# Patient Record
Sex: Male | Born: 1955
Health system: Southern US, Community
[De-identification: ages and names within clinical notes are randomized; demographics above are authoritative.]

## PROBLEM LIST (undated history)

## (undated) DIAGNOSIS — E785 Hyperlipidemia, unspecified: Secondary | ICD-10-CM

## (undated) DIAGNOSIS — I509 Heart failure, unspecified: Secondary | ICD-10-CM

## (undated) DIAGNOSIS — T7840XA Allergy, unspecified, initial encounter: Secondary | ICD-10-CM

## (undated) DIAGNOSIS — I10 Essential (primary) hypertension: Secondary | ICD-10-CM

## (undated) DIAGNOSIS — Z9889 Other specified postprocedural states: Secondary | ICD-10-CM

## (undated) DIAGNOSIS — R739 Hyperglycemia, unspecified: Secondary | ICD-10-CM

## (undated) DIAGNOSIS — K219 Gastro-esophageal reflux disease without esophagitis: Secondary | ICD-10-CM

## (undated) DIAGNOSIS — J309 Allergic rhinitis, unspecified: Secondary | ICD-10-CM

## (undated) DIAGNOSIS — E669 Obesity, unspecified: Secondary | ICD-10-CM

## (undated) DIAGNOSIS — I5022 Chronic systolic (congestive) heart failure: Secondary | ICD-10-CM

## (undated) DIAGNOSIS — I428 Other cardiomyopathies: Secondary | ICD-10-CM

## (undated) DIAGNOSIS — G4730 Sleep apnea, unspecified: Secondary | ICD-10-CM

## (undated) DIAGNOSIS — M109 Gout, unspecified: Secondary | ICD-10-CM

## (undated) DIAGNOSIS — M5412 Radiculopathy, cervical region: Secondary | ICD-10-CM

## (undated) DIAGNOSIS — J45909 Unspecified asthma, uncomplicated: Secondary | ICD-10-CM

## (undated) DIAGNOSIS — M7711 Lateral epicondylitis, right elbow: Secondary | ICD-10-CM

## (undated) HISTORY — DX: Obesity, unspecified: E66.9

## (undated) HISTORY — DX: Allergic rhinitis, unspecified: J30.9

## (undated) HISTORY — DX: Allergy, unspecified, initial encounter: T78.40XA

## (undated) HISTORY — DX: Gout, unspecified: M10.9

## (undated) HISTORY — DX: Essential (primary) hypertension: I10

## (undated) HISTORY — DX: Chronic systolic (congestive) heart failure: I50.22

## (undated) HISTORY — DX: Unspecified asthma, uncomplicated: J45.909

## (undated) HISTORY — DX: Other cardiomyopathies: I42.8

## (undated) HISTORY — DX: Radiculopathy, cervical region: M54.12

## (undated) HISTORY — DX: Hyperlipidemia, unspecified: E78.5

## (undated) HISTORY — DX: Lateral epicondylitis, right elbow: M77.11

## (undated) HISTORY — PX: COLONOSCOPY: SHX174

## (undated) HISTORY — DX: Hyperglycemia, unspecified: R73.9

## (undated) SURGERY — Surgical Case
Anesthesia: *Unknown

---

## 1999-11-19 ENCOUNTER — Encounter: Admission: RE | Admit: 1999-11-19 | Discharge: 1999-11-19 | Payer: Self-pay | Admitting: General Surgery

## 1999-11-19 ENCOUNTER — Encounter: Payer: Self-pay | Admitting: General Surgery

## 1999-11-26 ENCOUNTER — Other Ambulatory Visit: Admission: RE | Admit: 1999-11-26 | Discharge: 1999-11-26 | Payer: Self-pay | Admitting: General Surgery

## 2002-04-28 ENCOUNTER — Emergency Department (HOSPITAL_COMMUNITY): Admission: EM | Admit: 2002-04-28 | Discharge: 2002-04-28 | Payer: Self-pay | Admitting: Emergency Medicine

## 2004-08-21 ENCOUNTER — Emergency Department (HOSPITAL_COMMUNITY): Admission: EM | Admit: 2004-08-21 | Discharge: 2004-08-21 | Payer: Self-pay | Admitting: Emergency Medicine

## 2005-07-13 ENCOUNTER — Ambulatory Visit: Payer: Self-pay | Admitting: Family Medicine

## 2005-07-14 ENCOUNTER — Ambulatory Visit: Payer: Self-pay | Admitting: *Deleted

## 2005-09-04 ENCOUNTER — Ambulatory Visit: Payer: Self-pay | Admitting: Family Medicine

## 2006-01-21 ENCOUNTER — Ambulatory Visit: Payer: Self-pay | Admitting: Family Medicine

## 2009-04-17 ENCOUNTER — Ambulatory Visit: Payer: Self-pay | Admitting: Gastroenterology

## 2009-05-01 ENCOUNTER — Encounter: Payer: Self-pay | Admitting: Gastroenterology

## 2009-05-01 ENCOUNTER — Ambulatory Visit: Payer: Self-pay | Admitting: Gastroenterology

## 2009-05-03 ENCOUNTER — Encounter: Payer: Self-pay | Admitting: Gastroenterology

## 2009-07-01 ENCOUNTER — Telehealth (INDEPENDENT_AMBULATORY_CARE_PROVIDER_SITE_OTHER): Payer: Self-pay | Admitting: *Deleted

## 2009-07-01 ENCOUNTER — Ambulatory Visit: Payer: Self-pay | Admitting: Internal Medicine

## 2009-07-01 ENCOUNTER — Telehealth: Payer: Self-pay | Admitting: Internal Medicine

## 2009-07-01 DIAGNOSIS — Z86010 Personal history of colonic polyps: Secondary | ICD-10-CM | POA: Insufficient documentation

## 2009-07-01 DIAGNOSIS — M109 Gout, unspecified: Secondary | ICD-10-CM | POA: Insufficient documentation

## 2009-07-01 DIAGNOSIS — J309 Allergic rhinitis, unspecified: Secondary | ICD-10-CM | POA: Insufficient documentation

## 2009-07-01 DIAGNOSIS — I10 Essential (primary) hypertension: Secondary | ICD-10-CM | POA: Insufficient documentation

## 2009-07-01 LAB — CONVERTED CEMR LAB
ALT: 28 units/L (ref 0–53)
AST: 25 units/L (ref 0–37)
Albumin: 4.5 g/dL (ref 3.5–5.2)
Alkaline Phosphatase: 47 units/L (ref 39–117)
BUN: 10 mg/dL (ref 6–23)
Basophils Absolute: 0 10*3/uL (ref 0.0–0.1)
Basophils Relative: 0.7 % (ref 0.0–3.0)
Bilirubin Urine: NEGATIVE
Bilirubin, Direct: 0.1 mg/dL (ref 0.0–0.3)
CO2: 30 meq/L (ref 19–32)
Calcium: 9.5 mg/dL (ref 8.4–10.5)
Chloride: 104 meq/L (ref 96–112)
Cholesterol: 176 mg/dL (ref 0–200)
Creatinine, Ser: 1 mg/dL (ref 0.4–1.5)
Eosinophils Absolute: 0.1 10*3/uL (ref 0.0–0.7)
Eosinophils Relative: 1.3 % (ref 0.0–5.0)
GFR calc non Af Amer: 99.83 mL/min (ref >60)
Glucose, Bld: 110 mg/dL — ABNORMAL HIGH (ref 70–99)
HCT: 51 % (ref 39.0–52.0)
HDL: 34.9 mg/dL — ABNORMAL LOW (ref >39.00)
Hemoglobin, Urine: NEGATIVE
Hemoglobin: 16.5 g/dL (ref 13.0–17.0)
Ketones, ur: NEGATIVE mg/dL
LDL Cholesterol: 123 mg/dL — ABNORMAL HIGH (ref 0–99)
Leukocytes, UA: NEGATIVE
Lymphocytes Relative: 44.1 % (ref 12.0–46.0)
Lymphs Abs: 2.6 10*3/uL (ref 0.7–4.0)
MCHC: 32.3 g/dL (ref 30.0–36.0)
MCV: 88.1 fL (ref 78.0–100.0)
Monocytes Absolute: 0.3 10*3/uL (ref 0.1–1.0)
Monocytes Relative: 4.3 % (ref 3.0–12.0)
Neutro Abs: 2.9 10*3/uL (ref 1.4–7.7)
Neutrophils Relative %: 49.6 % (ref 43.0–77.0)
Nitrite: NEGATIVE
PSA: 0.52 ng/mL (ref 0.10–4.00)
Platelets: 132 10*3/uL — ABNORMAL LOW (ref 150.0–400.0)
Potassium: 4.3 meq/L (ref 3.5–5.1)
RBC: 5.8 M/uL (ref 4.22–5.81)
RDW: 13.5 % (ref 11.5–14.6)
Sodium: 140 meq/L (ref 135–145)
Specific Gravity, Urine: 1.01 (ref 1.000–1.030)
TSH: 1.96 microintl units/mL (ref 0.35–5.50)
Total Bilirubin: 0.9 mg/dL (ref 0.3–1.2)
Total CHOL/HDL Ratio: 5
Total Protein, Urine: NEGATIVE mg/dL
Total Protein: 7.7 g/dL (ref 6.0–8.3)
Triglycerides: 90 mg/dL (ref 0.0–149.0)
Urine Glucose: NEGATIVE mg/dL
Urobilinogen, UA: 0.2 (ref 0.0–1.0)
VLDL: 18 mg/dL (ref 0.0–40.0)
WBC: 5.9 10*3/uL (ref 4.5–10.5)
pH: 6 (ref 5.0–8.0)

## 2009-07-02 LAB — CONVERTED CEMR LAB: Hgb A1c MFr Bld: 6.1 % (ref 4.6–6.5)

## 2009-07-12 ENCOUNTER — Encounter: Payer: Self-pay | Admitting: Internal Medicine

## 2010-01-02 ENCOUNTER — Encounter (INDEPENDENT_AMBULATORY_CARE_PROVIDER_SITE_OTHER): Payer: Self-pay | Admitting: *Deleted

## 2010-01-02 ENCOUNTER — Ambulatory Visit: Payer: Self-pay | Admitting: Internal Medicine

## 2010-04-18 ENCOUNTER — Ambulatory Visit: Payer: Self-pay | Admitting: Internal Medicine

## 2010-06-18 ENCOUNTER — Ambulatory Visit
Admission: RE | Admit: 2010-06-18 | Discharge: 2010-06-18 | Payer: Self-pay | Source: Home / Self Care | Attending: Internal Medicine | Admitting: Internal Medicine

## 2010-06-18 LAB — CONVERTED CEMR LAB
ALT: 24 units/L (ref 0–53)
AST: 24 units/L (ref 0–37)
Albumin: 4.2 g/dL (ref 3.5–5.2)
Alkaline Phosphatase: 50 units/L (ref 39–117)
BUN: 14 mg/dL (ref 6–23)
Basophils Absolute: 0 10*3/uL (ref 0.0–0.1)
Basophils Relative: 0.4 % (ref 0.0–3.0)
Bilirubin Urine: NEGATIVE
Bilirubin, Direct: 0 mg/dL (ref 0.0–0.3)
Blood, UA: NEGATIVE
CO2: 26 meq/L (ref 19–32)
Calcium: 9.3 mg/dL (ref 8.4–10.5)
Chloride: 106 meq/L (ref 96–112)
Cholesterol: 165 mg/dL (ref 0–200)
Creatinine, Ser: 1 mg/dL (ref 0.4–1.5)
Direct LDL: 105.8 mg/dL
Eosinophils Absolute: 0.1 10*3/uL (ref 0.0–0.7)
Eosinophils Relative: 2 % (ref 0.0–5.0)
GFR calc non Af Amer: 104.28 mL/min (ref >60.00)
Glucose, Bld: 99 mg/dL (ref 70–99)
HCT: 47.7 % (ref 39.0–52.0)
HDL: 28.7 mg/dL — ABNORMAL LOW (ref >39.00)
Hemoglobin: 15.8 g/dL (ref 13.0–17.0)
Hgb A1c MFr Bld: 6.3 % (ref 4.6–6.5)
Ketones, ur: NEGATIVE mg/dL
Leukocytes, UA: NEGATIVE
Lymphocytes Relative: 44.5 % (ref 12.0–46.0)
Lymphs Abs: 3.2 10*3/uL (ref 0.7–4.0)
MCHC: 33.2 g/dL (ref 30.0–36.0)
MCV: 86.6 fL (ref 78.0–100.0)
Monocytes Absolute: 0.5 10*3/uL (ref 0.1–1.0)
Monocytes Relative: 6.5 % (ref 3.0–12.0)
Neutro Abs: 3.4 10*3/uL (ref 1.4–7.7)
Neutrophils Relative %: 46.6 % (ref 43.0–77.0)
Nitrite: NEGATIVE
PSA: 0.4 ng/mL (ref 0.10–4.00)
Platelets: 143 10*3/uL — ABNORMAL LOW (ref 150.0–400.0)
Potassium: 4.3 meq/L (ref 3.5–5.1)
RBC: 5.51 M/uL (ref 4.22–5.81)
RDW: 14.5 % (ref 11.5–14.6)
Sodium: 138 meq/L (ref 135–145)
Specific Gravity, Urine: 1.02 (ref 1.000–1.030)
TSH: 3.47 microintl units/mL (ref 0.35–5.50)
Total Bilirubin: 0.3 mg/dL (ref 0.3–1.2)
Total CHOL/HDL Ratio: 6
Total Protein, Urine: NEGATIVE mg/dL
Total Protein: 6.9 g/dL (ref 6.0–8.3)
Triglycerides: 252 mg/dL — ABNORMAL HIGH (ref 0.0–149.0)
Uric Acid, Serum: 4.8 mg/dL (ref 4.0–7.8)
Urine Glucose: NEGATIVE mg/dL
Urobilinogen, UA: 0.2 (ref 0.0–1.0)
VLDL: 50.4 mg/dL — ABNORMAL HIGH (ref 0.0–40.0)
WBC: 7.3 10*3/uL (ref 4.5–10.5)
pH: 6 (ref 5.0–8.0)

## 2010-07-03 ENCOUNTER — Encounter: Payer: Self-pay | Admitting: Internal Medicine

## 2010-07-03 ENCOUNTER — Ambulatory Visit
Admission: RE | Admit: 2010-07-03 | Discharge: 2010-07-03 | Payer: Self-pay | Source: Home / Self Care | Attending: Internal Medicine | Admitting: Internal Medicine

## 2010-07-03 ENCOUNTER — Telehealth: Payer: Self-pay | Admitting: Internal Medicine

## 2010-07-03 DIAGNOSIS — E785 Hyperlipidemia, unspecified: Secondary | ICD-10-CM | POA: Insufficient documentation

## 2010-07-09 ENCOUNTER — Telehealth: Payer: Self-pay | Admitting: Endocrinology

## 2010-07-09 ENCOUNTER — Telehealth (INDEPENDENT_AMBULATORY_CARE_PROVIDER_SITE_OTHER): Payer: Self-pay | Admitting: *Deleted

## 2010-07-16 ENCOUNTER — Telehealth: Payer: Self-pay | Admitting: Internal Medicine

## 2010-07-17 ENCOUNTER — Ambulatory Visit
Admission: RE | Admit: 2010-07-17 | Discharge: 2010-07-17 | Payer: Self-pay | Source: Home / Self Care | Attending: Internal Medicine | Admitting: Internal Medicine

## 2010-07-17 ENCOUNTER — Encounter: Payer: Self-pay | Admitting: Internal Medicine

## 2010-07-17 DIAGNOSIS — K5289 Other specified noninfective gastroenteritis and colitis: Secondary | ICD-10-CM | POA: Insufficient documentation

## 2010-07-22 NOTE — Assessment & Plan Note (Signed)
Summary: FLU SHOT/JWJ /NWS  Nurse Visit   Allergies: No Known Drug Allergies  Orders Added: 1)  Admin 1st Vaccine [90471] 2)  Flu Vaccine 79yrs + [16109]       Flu Vaccine Consent Questions     Do you have a history of severe allergic reactions to this vaccine? no    Any prior history of allergic reactions to egg and/or gelatin? no    Do you have a sensitivity to the preservative Thimersol? no    Do you have a past history of Guillan-Barre Syndrome? no    Do you currently have an acute febrile illness? no    Have you ever had a severe reaction to latex? no    Vaccine information given and explained to patient? yes    Are you currently pregnant? no    Lot Number:[id number]   Exp Date:12/20/2010   Site Given  Left Deltoid IM1

## 2010-07-22 NOTE — Assessment & Plan Note (Signed)
Summary: NEW [employer] PT--PKG/GIVEN--STC   Vital Signs:  Patient profile:   55 year old male Height:      70 inches Weight:      210 pounds BMI:     30.24 O2 Sat:      98 % on Room air Temp:     98.4 degrees F oral Pulse rate:   61 / minute BP sitting:   138 / 96  (left arm) Cuff size:   regular  Vitals Entered ByMarland Kitchen Zella Ball Ewing (July 01, 2009 9:21 AM)  O2 Flow:  Room air  Preventive Care Screening     had flu shot in oct 2010  CC: new patient/Re   CC:  new patient/Re.  History of Present Illness: here with bilat knee pain to both soides only wth squatting , hard to get up from that possition, no swelling; no trouble with walkign or standing;  no hip or back problems;  Pt denies CP, sob, doe, wheezing, orthopnea, pnd, worsening LE edema, palps, dizziness or syncope  Pt denies new neuro symptoms such as headache, facial or extremity weakness Pt denies polydipsia, polyuria, or low sugar symptoms such as shakiness improved with eating.  Overall good compliance with meds, trying to follow low chol, DM diet, wt stable, little excercise however   Preventive Screening-Counseling & Management  Alcohol-Tobacco     Smoking Status: never  Problems Prior to Update: None  Medications Prior to Update: 1)  None  Current Medications (verified): 1)  Allopurinol 100 Mg Tabs (Allopurinol) .[name].. 1 By Mouth Once Daily 2)  Carvedilol 12.5 Mg Tabs (Carvedilol) .[name].. 1 By Mouth Two Times A Day 3)  Furosemide 20 Mg Tabs (Furosemide) .[name].. 1 By Mouth Once Daily 4)  Benazepril Hcl 10 Mg Tabs (Benazepril Hcl) .[name].. 1 By Mouth Once Daily 5)  Colchicine 0.6 Mg Tabs (Colchicine) .[name].. 1 By Mouth Once Daily  Allergies (verified): No Known Drug Allergies  Past History:  Family History: Last updated: 07/01/2009 mother withg lung cancer father with DM, heart disease with MI at 49yo  aunt with DM uncle with renal disease - dialysis sister with HTN  - died with brain aneurysm/stroke at 56 yo  Social  History: Last updated: 07/01/2009 Single no children work - [employer] Never Smoked Alcohol use-yes  Risk Factors: Smoking Status: never (07/01/2009)  Past Medical History: fatty liver Gout Hypertension Allergic rhinitis Colonic polyps, hx of  Past Surgical History: Denies surgical history  Family History: Reviewed history and no changes required. mother withg lung cancer father with DM, heart disease with MI at 26yo  aunt with DM uncle with renal disease - dialysis sister with HTN  - died with brain aneurysm/stroke at 56 yo  Social History: Reviewed history and no changes required. Single no children work - [employer] Never Smoked Alcohol use-yes Smoking Status:  never  Review of Systems       all otherwise negative per pt - 12 system review done   Physical Exam  General:  alert and overweight-appearing.   Head:  normocephalic and atraumatic.   Eyes:  vision grossly intact, pupils equal, and pupils round.   Ears:  R ear normal and L ear normal.   Nose:  no external deformity and no nasal discharge.   Mouth:  no gingival abnormalities and pharynx pink and moist.   Neck:  supple and no masses.   Lungs:  normal respiratory effort and normal breath sounds.   Heart:  normal rate and  regular rhythm.   Abdomen:  soft, non-tender, and normal bowel sounds.   Msk:  no joint tenderness and no joint swelling.   Extremities:  no edema, no erythema  Neurologic:  cranial nerves II-XII intact and strength normal in all extremities.     Impression & Recommendations:  Problem # 1:  Preventive Health Care (ICD-V70.0)  Overall doing well, up to date, counseled on routine health concerns for screening and prevention, immunizations up to date or declined, labs ordered  Orders: TLB-BMP (Basic Metabolic Panel-BMET) (80048-METABOL) TLB-CBC Platelet - w/Differential (85025-CBCD) TLB-Hepatic/Liver Function Pnl (80076-HEPATIC) TLB-Lipid Panel (80061-LIPID) TLB-TSH  (Thyroid Stimulating Hormone) (84443-TSH) TLB-PSA (Prostate Specific Antigen) (84153-PSA) TLB-Udip ONLY (81003-UDIP)  Complete Medication List: 1)  Allopurinol 100 Mg Tabs (Allopurinol) .[name].. 1 by mouth once daily 2)  Carvedilol 12.5 Mg Tabs (Carvedilol) .[name].. 1 by mouth two times a day 3)  Furosemide 20 Mg Tabs (Furosemide) .[name].. 1 by mouth once daily 4)  Benazepril Hcl 10 Mg Tabs (Benazepril hcl) .[name].. 1 by mouth once daily 5)  Colchicine 0.6 Mg Tabs (Colchicine) .[name].. 1 by mouth once daily  Other Orders: Tdap => 84yrs IM (42595) Admin 1st Vaccine (63875)  Patient Instructions: 1)  you had the tetanus shot today 2)  Please go to the Lab in the basement for your blood and/or urine tests today 3)  Please schedule a follow-up appointment in 6 months. Prescriptions: COLCHICINE 0.6 MG TABS (COLCHICINE) 1 by mouth once daily  #90 x 3   Entered and Authorized by:   Corwin Levins MD   Signed by:   Corwin Levins MD on 07/01/2009   Method used:   Print then Give to Patient   RxID:   [id number] BENAZEPRIL HCL 10 MG TABS (BENAZEPRIL HCL) 1 by mouth once daily  #90 x 3   Entered and Authorized by:   Corwin Levins MD   Signed by:   Corwin Levins MD on 07/01/2009   Method used:   Print then Give to Patient   RxID:   [id number] FUROSEMIDE 20 MG TABS (FUROSEMIDE) 1 by mouth once daily  #90 x 3   Entered and Authorized by:   Corwin Levins MD   Signed by:   Corwin Levins MD on 07/01/2009   Method used:   Print then Give to Patient   RxID:   [id number] CARVEDILOL 12.5 MG TABS (CARVEDILOL) 1 by mouth two times a day  #180 x 3   Entered and Authorized by:   Corwin Levins MD   Signed by:   Corwin Levins MD on 07/01/2009   Method used:   Print then Give to Patient   RxID:   [id number] ALLOPURINOL 100 MG TABS (ALLOPURINOL) 1 by mouth once daily  #90 x 3   Entered and Authorized by:   Corwin Levins MD   Signed by:   Corwin Levins MD on 07/01/2009   Method used:   Print then Give to  Patient   RxID:   [id number]    Immunization History:  Influenza Immunization History:    Influenza:  historical (03/22/2009)  Immunizations Administered:  Tetanus Vaccine:    Vaccine Type: Tdap    Site: right deltoid    Mfr: GlaxoSmithKline    Dose: 0.5 ml    Route: IM    Given by: Robin Ewing    Exp. Date: 08/17/2011    Lot #: [id number]    VIS given: 05/10/07 version given July 01, 2009.  

## 2010-07-22 NOTE — Progress Notes (Signed)
----   Converted from flag ---- ---- 07/01/2009 4:34 PM, Corwin Levins MD wrote: please send addon slip for hgba1c - 790.2 ------------------------------  sent addon to lab

## 2010-07-22 NOTE — Progress Notes (Signed)
----   Converted from flag ---- ---- 07/01/2009 4:34 PM, James W John MD wrote: please send addon slip for hgba1c - 790.2 ------------------------------  sent addon to lab 

## 2010-07-22 NOTE — Assessment & Plan Note (Signed)
Summary: 6 MO ROV PER PT/NWS  #   Vital Signs:  Patient profile:   55 year old male Height:      71 inches Weight:      211.50 pounds BMI:     29.60 O2 Sat:      96 % on Room air Temp:     98.4 degrees F oral Pulse rate:   72 / minute BP sitting:   134 / 92  (left arm) Cuff size:   large  Vitals Entered By: Zella Ball Ewing CMA Duncan Dull) (January 02, 2010 2:33 PM)  O2 Flow:  Room air CC: 6 month ROV/RE   CC:  6 month ROV/RE.  History of Present Illness: BP at home < 128/90;  overall doing very well, Pt denies CP, sob, doe, wheezing, orthopnea, pnd, worsening LE edema, palps, dizziness or syncope  Pt denies new neuro symptoms such as headache, facial or extremity weakness  Pt denies polydipsia, polyuria, or low sugar symptoms such as shakiness improved with eating.  Overall good compliance with meds, trying to follow low chol, DM diet, wt stable, little excercise however   No fever, wt loss, night sweats, or other constitutional symptoms  No recent joint swelling or pain. Sinus allergies have been mild in the past 2 wks with clear nasal d/c.  Problems Prior to Update: 1)  Preventive Health Care  (ICD-V70.0) 2)  Colonic Polyps, Hx of  (ICD-V12.72) 3)  Allergic Rhinitis  (ICD-477.9) 4)  Hypertension  (ICD-401.9) 5)  Gout  (ICD-274.9)  Medications Prior to Update: 1)  Allopurinol 300 Mg Tabs (Allopurinol) .[name].. 1 By Mouth Once Daily 2)  Carvedilol 12.5 Mg Tabs (Carvedilol) .[name].. 1 By Mouth Two Times A Day 3)  Furosemide 20 Mg Tabs (Furosemide) .[name].. 1 By Mouth Once Daily 4)  Benazepril Hcl 10 Mg Tabs (Benazepril Hcl) .[name].. 1 By Mouth Once Daily  Current Medications (verified): 1)  Allopurinol 300 Mg Tabs (Allopurinol) .[name].. 1 By Mouth Once Daily 2)  Carvedilol 12.5 Mg Tabs (Carvedilol) .[name].. 1 By Mouth Two Times A Day 3)  Furosemide 20 Mg Tabs (Furosemide) .[name].. 1 By Mouth Once Daily 4)  Benazepril Hcl 10 Mg Tabs (Benazepril Hcl) .[name].. 1 By Mouth Once Daily 5)  Colcrys 0.6 Mg Tabs (Colchicine)  .[name].. 1 By Mouth Once Daily  Allergies (verified): No Known Drug Allergies  Past History:  Past Medical History: Last updated: 07/01/2009 fatty liver Gout Hypertension Allergic rhinitis Colonic polyps, hx of  Past Surgical History: Last updated: 07/01/2009 Denies surgical history  Social History: Last updated: 07/01/2009 Single no children work - [employer] Never Smoked Alcohol use-yes  Risk Factors: Smoking Status: never (07/01/2009)  Review of Systems       all otherwise negative per pt -    Physical Exam  General:  alert and overweight-appearing.   Head:  normocephalic and atraumatic.   Eyes:  vision grossly intact, pupils equal, and pupils round.   Ears:  R ear normal and L ear normal.   Nose:  nasal dischargemucosal pallor and mucosal edema.   Mouth:  no gingival abnormalities and pharynx pink and moist.   Neck:  supple and no masses.   Lungs:  normal respiratory effort and normal breath sounds.   Heart:  normal rate and regular rhythm.   Msk:  no joint tenderness and no joint swelling.   Extremities:  no edema, no erythema    Impression & Recommendations:  Problem # 1:  GOUT (ICD-274.9)  His updated  medication list for this problem includes:    Allopurinol 300 Mg Tabs (Allopurinol) .[name]... 1 by mouth once daily    Colcrys 0.6 Mg Tabs (Colchicine) .[name]... 1 by mouth once daily stable overall by hx and exam, ok to continue meds/tx as is   Problem # 2:  HYPERTENSION (ICD-401.9)  His updated medication list for this problem includes:    Carvedilol 12.5 Mg Tabs (Carvedilol) .[name]... 1 by mouth two times a day    Furosemide 20 Mg Tabs (Furosemide) .[name]... 1 by mouth once daily    Benazepril Hcl 10 Mg Tabs (Benazepril hcl) .[name]... 1 by mouth once daily  BP today: 134/92 Prior BP: 138/96 (07/01/2009)  Labs Reviewed: K+: 4.3 (07/01/2009) Creat: : 1.0 (07/01/2009)   Chol: 176 (07/01/2009)   HDL: 34.90 (07/01/2009)   LDL: 123 (07/01/2009)   TG: 90.0  (07/01/2009) stable overall by hx and exam, ok to continue meds/tx as is   Problem # 3:  ALLERGIC RHINITIS (ICD-477.9) ok for OTC allegra as needed   Complete Medication List: 1)  Allopurinol 300 Mg Tabs (Allopurinol) .[name].. 1 by mouth once daily 2)  Carvedilol 12.5 Mg Tabs (Carvedilol) .[name].. 1 by mouth two times a day 3)  Furosemide 20 Mg Tabs (Furosemide) .[name].. 1 by mouth once daily 4)  Benazepril Hcl 10 Mg Tabs (Benazepril hcl) .[name].. 1 by mouth once daily 5)  Colcrys 0.6 Mg Tabs (Colchicine) .[name].. 1 by mouth once daily  Patient Instructions: 1)  Please get regular excercise, and follow lower cholesterol diet 2)  Continue all previous medications as before this visit  3)  Please schedule a follow-up appointment in 6 months with CPX labs and: 4)  uric acid 274.9 5)  HbgA1C prior to visit, ICD-9: 790.2 Prescriptions: COLCRYS 0.6 MG TABS (COLCHICINE) 1 by mouth once daily  #90 x 3   Entered and Authorized by:   Corwin Levins MD   Signed by:   Corwin Levins MD on 01/02/2010   Method used:   Print then Give to Patient   RxID:   [id number] BENAZEPRIL HCL 10 MG TABS (BENAZEPRIL HCL) 1 by mouth once daily  #90 x 3   Entered and Authorized by:   Corwin Levins MD   Signed by:   Corwin Levins MD on 01/02/2010   Method used:   Print then Give to Patient   RxID:   [id number] FUROSEMIDE 20 MG TABS (FUROSEMIDE) 1 by mouth once daily  #90 x 3   Entered and Authorized by:   Corwin Levins MD   Signed by:   Corwin Levins MD on 01/02/2010   Method used:   Print then Give to Patient   RxID:   [id number] CARVEDILOL 12.5 MG TABS (CARVEDILOL) 1 by mouth two times a day  #180 x 3   Entered and Authorized by:   Corwin Levins MD   Signed by:   Corwin Levins MD on 01/02/2010   Method used:   Print then Give to Patient   RxID:   [id number] ALLOPURINOL 300 MG TABS (ALLOPURINOL) 1 by mouth once daily  #90 x 3   Entered and Authorized by:   Corwin Levins MD   Signed by:   Corwin Levins MD on  01/02/2010   Method used:   Print then Give to Patient   RxID:   [id number]

## 2010-07-22 NOTE — Letter (Signed)
Summary: [facility]  [address] [place], [place] [address]   Phone: [phone]  Fax: [phone]    Today's Date: January 02, 2010  Name of Patient: Matthew Guerrero  The above named patient had a medical visit today at:  2:45 pm.  Please take this into consideration when reviewing the time away from work/school.    Special Instructions:  [ X ] None  [  ] To be off the remainder of today, returning to the normal work / school schedule tomorrow.  [  ] To be off until the next scheduled appointment on ______________________.  [  ] Other ________________________________________________________________ ________________________________________________________________________   Sincerely yours,   Margaret Pyle, CMA

## 2010-07-22 NOTE — Medication Information (Signed)
Summary: Request a Rx change/[facility]  [facility]   Imported By: Sherian Rein 07/16/2009 15:14:04  _____________________________________________________________________  External Attachment:    Type:   Image     Comment:   External Document

## 2010-07-24 NOTE — Letter (Signed)
Summary: Out of Work  [facility]  [address] [place], [place] [address]   Phone: [phone]  Fax: [phone]    July 17, 2010   Employee:  DAUNTE Sobotka    To Whom It May Concern:   For Medical reasons, please excuse the above named employee from work for the following dates:  Start:   07/17/10  End:   07/21/10  If you need additional information, please feel free to contact our office.         Sincerely,    Etta Grandchild MD

## 2010-07-24 NOTE — Progress Notes (Signed)
Summary: Alternative medication  Phone Note Call from Patient Call back at Home Phone [phone] Call back at [phone]   Caller: Patient Call For: Corwin Levins MD Summary of Call: Patient called requesting less expensive alternative to Colcrys. Patients pharmacy is [facility] [address] Initial call taken by: Robin Ewing CMA Duncan Dull),  July 09, 2010 11:30 AM  Follow-up for Phone Call        (duplicate message) Follow-up by: Minus Breeding MD,  July 09, 2010 12:19 PM

## 2010-07-24 NOTE — Assessment & Plan Note (Signed)
Summary: 6 MTH PHYSICAL  STC   Vital Signs:  Patient profile:   55 year old male Height:      70 inches Weight:      212.13 pounds BMI:     30.55 O2 Sat:      95 % on Room air Temp:     98 degrees F oral Pulse rate:   78 / minute BP sitting:   132 / 90  (left arm) Cuff size:   large  Vitals Entered By: Zella Ball Ewing CMA Duncan Dull) (July 03, 2010 8:35 AM)  O2 Flow:  Room air  Preventive Care Screening     had the flu shot  CC: Adult Physical/RE   CC:  Adult Physical/RE.  History of Present Illness: here for wellness, overall doing ok;  Pt denies CP, worsening sob, doe, wheezing, orthopnea, pnd, worsening LE edema, palps, dizziness or syncope  Pt denies new neuro symptoms such as headache, facial or extremity weakness  Pt denies polydipsia, polyuria,  Overall good compliance with meds, trying to follow low chol diet, wt stable, little excercise however  BP today higher than he always gets at home - usually < 130/90.  Denies worsening depressive symptoms, suicidal ideation, or panic.   Overall good compliance with meds, and good tolerability.  No fever, wt loss, night sweats, loss of appetite or other constitutional symptoms  Pt states good ability with ADL's, low fall risk, home safety reviewed and adequate, no significant change in hearing or vision, trying to follow lower chol diet, and occasionally active only with regular excercise.   Preventive Screening-Counseling & Management      Drug Use:  no.    Problems Prior to Update: 1)  Hyperlipidemia  (ICD-272.4) 2)  Preventive Health Care  (ICD-V70.0) 3)  Colonic Polyps, Hx of  (ICD-V12.72) 4)  Allergic Rhinitis  (ICD-477.9) 5)  Hypertension  (ICD-401.9) 6)  Gout  (ICD-274.9)  Medications Prior to Update: 1)  Allopurinol 300 Mg Tabs (Allopurinol) .[name].. 1 By Mouth Once Daily 2)  Carvedilol 12.5 Mg Tabs (Carvedilol) .[name].. 1 By Mouth Two Times A Day 3)  Furosemide 20 Mg Tabs (Furosemide) .[name].. 1 By Mouth Once Daily 4)  Benazepril  Hcl 10 Mg Tabs (Benazepril Hcl) .[name].. 1 By Mouth Once Daily 5)  Colcrys 0.6 Mg Tabs (Colchicine) .[name].. 1 By Mouth Once Daily  Current Medications (verified): 1)  Allopurinol 300 Mg Tabs (Allopurinol) .[name].. 1 By Mouth Once Daily 2)  Carvedilol 12.5 Mg Tabs (Carvedilol) .[name].. 1 By Mouth Two Times A Day 3)  Furosemide 20 Mg Tabs (Furosemide) .[name].. 1 By Mouth Once Daily 4)  Benazepril Hcl 10 Mg Tabs (Benazepril Hcl) .[name].. 1 By Mouth Once Daily 5)  Colcrys 0.6 Mg Tabs (Colchicine) .[name].. 1 By Mouth Once Daily 6)  Aspir-Low 81 Mg Tbec (Aspirin) .[name].. 1po Once Daily  Allergies (verified): No Known Drug Allergies  Past History:  Past Surgical History: Last updated: 07/01/2009 Denies surgical history  Family History: Last updated: 07/01/2009 mother withg lung cancer father with DM, heart disease with MI at 62yo  aunt with DM uncle with renal disease - dialysis sister with HTN  - died with brain aneurysm/stroke at 56 yo  Social History: Last updated: 07/03/2010 Single no children work - [employer] Never Smoked Alcohol use-yes Drug use-no  Risk Factors: Smoking Status: never (07/01/2009)  Past Medical History: fatty liver Gout Hypertension Allergic rhinitis Colonic polyps, hx of Hyperlipidemia  Social History: Single no children work - [employer] Never Smoked Alcohol use-yes Drug use-no Drug Use:  no  Review of Systems  The patient denies anorexia, fever, vision loss, decreased hearing, hoarseness, chest pain, syncope, dyspnea on exertion, peripheral edema, prolonged cough, headaches, hemoptysis, abdominal pain, melena, hematochezia, severe indigestion/heartburn, hematuria, muscle weakness, suspicious skin lesions, transient blindness, difficulty walking, depression, unusual weight change, abnormal bleeding, enlarged lymph nodes, and angioedema.         all otherwise negative per pt -    Physical Exam  General:  alert and overweight-appearing.   Head:   normocephalic and atraumatic.   Eyes:  vision grossly intact, pupils equal, and pupils round.   Ears:  R ear normal and L ear normal.   Nose:  nasal dischargemucosal pallor and mucosal edema.   Mouth:  no gingival abnormalities and pharynx pink and moist.   Neck:  supple and no masses.   Lungs:  normal respiratory effort and normal breath sounds.   Heart:  normal rate and regular rhythm.   Abdomen:  soft, non-tender, and normal bowel sounds.   Msk:  no joint tenderness and no joint swelling.   Extremities:  no edema, no erythema  Neurologic:  cranial nerves II-XII intact and strength normal in all extremities.   Skin:  color normal and no rashes.   Psych:  not anxious appearing and not depressed appearing.     Impression & Recommendations:  Problem # 1:  Preventive Health Care (ICD-V70.0) Overall doing well, age appropriate education and counseling updated, referral for preventive services and immunizations addressed, dietary counseling and smoking status adressed , most recent labs reviewed, ecg reviewed I have personally reviewed and have noted 1.The patient's medical and social history 2.Their use of alcohol, tobacco or illicit drugs 3.Their current medications and supplements 4. Functional ability including ADL's, fall risk, home safety risk, hearing & visual impairment  5.Diet and physical activities 6.Evidence for depression or mood disorders The patients weight, height, BMI  have been recorded in the chart I have made referrals, counseling and provided education to the patient based review of the above  Orders: EKG w/ Interpretation (93000)  Problem # 2:  HYPERTENSION (ICD-401.9)  His updated medication list for this problem includes:    Carvedilol 12.5 Mg Tabs (Carvedilol) .[name]... 1 by mouth two times a day    Furosemide 20 Mg Tabs (Furosemide) .[name]... 1 by mouth once daily    Benazepril Hcl 10 Mg Tabs (Benazepril hcl) .[name]... 1 by mouth once daily  BP today: 132/90 Prior  BP: 134/92 (01/02/2010)  Labs Reviewed: K+: 4.3 (06/18/2010) Creat: : 1.0 (06/18/2010)   Chol: 165 (06/18/2010)   HDL: 28.70 (06/18/2010)   LDL: 123 (07/01/2009)   TG: 252.0 (06/18/2010) stable overall by hx and exam, ok to continue meds/tx as is   Complete Medication List: 1)  Allopurinol 300 Mg Tabs (Allopurinol) .[name].. 1 by mouth once daily 2)  Carvedilol 12.5 Mg Tabs (Carvedilol) .[name].. 1 by mouth two times a day 3)  Furosemide 20 Mg Tabs (Furosemide) .[name].. 1 by mouth once daily 4)  Benazepril Hcl 10 Mg Tabs (Benazepril hcl) .[name].. 1 by mouth once daily 5)  Colcrys 0.6 Mg Tabs (Colchicine) .[name].. 1 by mouth once daily 6)  Aspir-low 81 Mg Tbec (Aspirin) .[name].. 1po once daily  Patient Instructions: 1)  Continue all previous medications as before this visit 2)  Please schedule a follow-up appointment in 1 year, or sooner if needed 3)  Take an Aspirin every day - 1 per day -  81 mg - COATED only Prescriptions: COLCRYS 0.6 MG TABS (COLCHICINE) 1 by mouth once daily  #90 x 3   Entered and Authorized by:   Corwin Levins MD   Signed by:   Corwin Levins MD on 07/03/2010   Method used:   Electronically to        [facility] [phone]* (retail)       [address]       [place], [place]  [address]       Ph: [phone]       Fax: [phone]   RxID:   [id number] BENAZEPRIL HCL 10 MG TABS (BENAZEPRIL HCL) 1 by mouth once daily  #90 x 3   Entered and Authorized by:   Corwin Levins MD   Signed by:   Corwin Levins MD on 07/03/2010   Method used:   Electronically to        [facility] [phone]* (retail)       [address]       [place], [place]  [address]       Ph: [phone]       Fax: [phone]   RxID:   [id number] FUROSEMIDE 20 MG TABS (FUROSEMIDE) 1 by mouth once daily  #90 x 3   Entered and Authorized by:   Corwin Levins MD   Signed by:   Corwin Levins MD on 07/03/2010   Method used:   Electronically to        [facility] [phone]* (retail)       [address]       [place], [place]  [address]       Ph: [phone]       Fax: [phone]   RxID:   [id number] CARVEDILOL 12.5 MG TABS (CARVEDILOL) 1 by mouth two times a day  #180 x 3   Entered and Authorized by:   Corwin Levins MD   Signed by:   Corwin Levins MD on 07/03/2010   Method used:   Electronically to        [facility] [phone]* (retail)       [address]       [place], [place]  [address]       Ph: [phone]       Fax: [phone]   RxID:   [id number] ALLOPURINOL 300 MG TABS (ALLOPURINOL) 1 by mouth once daily  #90 x 3   Entered and Authorized by:   Corwin Levins MD   Signed by:   Corwin Levins MD on 07/03/2010   Method used:   Electronically to        [facility] [phone]* (retail)       [address]       [place], [place]  [address]       Ph: [phone]       Fax: [phone]   RxID:   [id number] BENAZEPRIL HCL 10 MG TABS (BENAZEPRIL HCL) 1 by mouth once daily  #90 x 3   Entered and Authorized by:   Corwin Levins MD   Signed by:   Corwin Levins MD on 07/03/2010   Method used:   Electronically to        [facility] [phone]* (retail)       [address]       [place], [place]  [address]       Ph: [phone]  Fax: [phone]   RxID:   [id number] COLCRYS 0.6 MG TABS (COLCHICINE) 1 by mouth once daily  #90 x 3   Entered and Authorized by:   Corwin Levins MD   Signed by:   Corwin Levins MD on 07/03/2010   Method used:   Electronically to        [facility] [address]* (retail)       [address]       [place], [place]  [address]       Ph: [phone]       Fax: [phone]   RxID:   [id number] BENAZEPRIL HCL 10 MG TABS (BENAZEPRIL HCL) 1 by mouth once daily  #90 x 3   Entered and Authorized by:   Corwin Levins MD   Signed by:   Corwin Levins MD on 07/03/2010   Method used:   Electronically to        [facility] [address]* (retail)       [address]       [place], [place]  [address]       Ph: [phone]       Fax: [phone]   RxID:   [id number] FUROSEMIDE 20 MG TABS (FUROSEMIDE) 1 by mouth once daily  #90 x 3   Entered and Authorized by:   Corwin Levins MD   Signed by:   Corwin Levins MD on 07/03/2010   Method used:   Electronically to        [facility] [address]* (retail)       [address]       [place], [place]  [address]       Ph: [phone]       Fax: [phone]   RxID:   [id number] CARVEDILOL 12.5 MG TABS (CARVEDILOL) 1 by mouth two times a day  #180 x 3   Entered and Authorized by:   Corwin Levins MD   Signed by:   Corwin Levins MD on 07/03/2010   Method used:   Electronically to        [facility] [address]* (retail)       [address]       [place], [place]  [address]       Ph: [phone]       Fax: [phone]   RxID:   [id number] ALLOPURINOL 300 MG TABS (ALLOPURINOL) 1 by mouth once daily  #90 x 3   Entered and Authorized by:   Corwin Levins MD   Signed by:   Corwin Levins MD on 07/03/2010   Method used:   Electronically to        [facility] [address]* (retail)       [address]       [place], [place]  [address]       Ph: [phone]       Fax: [phone]   RxID:   [id number]    Orders Added: 1)  EKG w/ Interpretation [93000] 2)  Est. Patient 40-64 years [phone]

## 2010-07-24 NOTE — Progress Notes (Signed)
Summary: prescription for gout  Phone Note Call from Patient Call back at Home Phone [phone] Call back at [phone]   Caller: Patient Call For: Corwin Levins MD Summary of Call: patient called requesting prescription JWJ wrote for Gout is too expensive and needs cheaper alternative sent to [facility] [address] He also would like a 90 day supply. Initial call taken by: Robin Ewing CMA Duncan Dull),  July 09, 2010 12:01 PM  Follow-up for Phone Call        there is no alternative, but it would be cheaper to take just as needed. Follow-up by: Minus Breeding MD,  July 09, 2010 12:19 PM  Additional Follow-up for Phone Call Additional follow up Details #1::        called pt. left msg. to call back Additional Follow-up by: Robin Ewing CMA Duncan Dull),  July 09, 2010 4:27 PM    Additional Follow-up for Phone Call Additional follow up Details #2::    Called patient informed of Dr. George Hugh recommendation. The patient would like to have note passed onto JWJ to inform once back from vacation and get his recommendation,. Follow-up by: Zella Ball Ewing CMA Duncan Dull),  July 10, 2010 9:13 AM  Additional Follow-up for Phone Call Additional follow up Details #3:: Details for Additional Follow-up Action Taken: the only other alternative is to try uloric in place of the allopurinol/colcrys combo , as uroloric is supposed to be more effective than allopurinol and might get away without taking the combo  I think the best thing is to ask the patient if he wants to try this insted, but he should understand teh urolic is brand namea as well, and maybe even require a prior auth;  maybe he should ask at the pharmacy how much the 30 and 90 days of uloric might cost before a prescription is done Additional Follow-up by: Corwin Levins MD,  July 10, 2010 2:11 PM  New/Updated Medications: ULORIC 80 MG TABS (FEBUXOSTAT) 1po once daily Prescriptions: ULORIC 80 MG TABS (FEBUXOSTAT) 1po once daily  #30 x 11  Entered and Authorized by:   Corwin Levins MD   Signed by:   Corwin Levins MD on 07/13/2010   Method used:   Electronically to        [facility] [phone]* (retail)       [address]       [place], [place]  [address]       Ph: [phone]       Fax: [phone]   RxID:   [id number]    Called patient informed of information. He would like to try a 30 day supply of Uloric, pharmacy is [facility].  ok for uloric  - done per EMR  Please instruct pt to stop the allopurinol and colcrys  Corwin Levins MD  July 13, 2010 1:47 PM   Appended Document: prescription for gout called pt. informed of above information

## 2010-07-24 NOTE — Progress Notes (Signed)
----   Converted from flag ---- ---- 07/03/2010 9:20 AM, Corwin Levins MD wrote: please call pt - remind him he really should start taking COATED baby Aspirin 81 mg -1 per day - to avoid stroke and heart attacks ------------------------------ called pt. left msg. to call back  called pt on his cell number at [phone] and informed of above information.

## 2010-07-24 NOTE — Progress Notes (Signed)
Summary: Medication  Phone Note Call from Patient   Caller: Patient Call For: Corwin Levins MD Summary of Call: Patient called and he did pickup his 30 day supply of Ulroic. He said the cost was good and would like a 90 day supply of uloric sent to [facility]. Initial call taken by: Robin Ewing CMA Duncan Dull),  July 16, 2010 11:43 AM  Follow-up for Phone Call        ok - to robin to handle Follow-up by: Corwin Levins MD,  July 16, 2010 1:10 PM    Prescriptions: ULORIC 80 MG TABS (FEBUXOSTAT) 1po once daily  #90 x 3   Entered by:   Scharlene Gloss CMA (AAMA)   Authorized by:   Corwin Levins MD   Signed by:   Scharlene Gloss CMA (AAMA) on 07/16/2010   Method used:   Faxed to ...       [facility] (mail-order)             , [place]         Ph: [phone]       Fax: [phone]   RxID:   [id number]

## 2010-07-24 NOTE — Assessment & Plan Note (Signed)
Summary: VIRUS/NWS   Vital Signs:  Patient profile:   55 year old male Height:      70 inches Weight:      206 pounds BMI:     29.66 O2 Sat:      95 % on Room air Temp:     98.2 degrees F oral Pulse rate:   80 / minute Pulse rhythm:   regular Resp:     16 per minute BP sitting:   130 / 90  (left arm) Cuff size:   large  Vitals Entered By: Rock Nephew CMA (July 17, 2010 10:07 AM)  Nutrition Counseling: Patient's BMI is greater than 25 and therefore counseled on weight management options.  O2 Flow:  Room air CC: patient c/o nausea, vomiting, diarrhea x 1day, Diarrhea Is Patient Diabetic? No Pain Assessment Patient in pain? no       Does patient need assistance? Functional Status Self care Ambulation Normal   Primary Care Provider:  Corwin Levins MD  CC:  patient c/o nausea, vomiting, diarrhea x 1day, and Diarrhea.  History of Present Illness:  Diarrhea      This is a 55 year old man who presents with Diarrhea.  The symptoms began 2 days ago.  The severity is described as mild.  The patient reports 3 stools or less per day, watery/unformed stools, and abrupt onset of symptoms, but denies voluminous stools, blood in stool, mucus in stool, greasy stools, malodorous stools, fecal urgency, fecal soiling, alternating diarrhea/constipation, nocturnal diarrhea, fasting diarrhea, bloating, gassiness, and gradual onset of symptoms.  Associated symptoms include nausea and vomiting.  The patient denies fever, abdominal pain, abdominal cramps, lightheadedness, increased thirst, weight loss, joint pains, mouth ulcers, and eye redness.  The symptoms are worse with specific foods.  The symptoms are better with fasting.  Patient's risk factors for diarrhea include sick contact.    Preventive Screening-Counseling & Management  Alcohol-Tobacco     Alcohol drinks/day: 0     Alcohol Counseling: not indicated; patient does not drink     Smoking Status: never     Tobacco Counseling: not  indicated; no tobacco use  Hep-HIV-STD-Contraception     Hepatitis Risk: no risk noted     HIV Risk: no risk noted     STD Risk: no risk noted      Sexual History:  currently monogamous.        Drug Use:  never.        Blood Transfusions:  no.    Clinical Review Panels:  Prevention   Last Colonoscopy:  DONE (05/01/2009)   Last PSA:  0.40 (06/18/2010)  Immunizations   Last Tetanus Booster:  Tdap (07/01/2009)   Last Flu Vaccine:  Fluvax 3+ (04/18/2010)  Lipid Management   Cholesterol:  165 (06/18/2010)   LDL (bad choesterol):  123 (07/01/2009)   HDL (good cholesterol):  28.70 (06/18/2010)  Diabetes Management   HgBA1C:  6.3 (06/18/2010)   Creatinine:  1.0 (06/18/2010)   Last Flu Vaccine:  Fluvax 3+ (04/18/2010)  CBC   WBC:  7.3 (06/18/2010)   RBC:  5.51 (06/18/2010)   Hgb:  15.8 (06/18/2010)   Hct:  47.7 (06/18/2010)   Platelets:  143.0 (06/18/2010)   MCV  86.6 (06/18/2010)   MCHC  33.2 (06/18/2010)   RDW  14.5 (06/18/2010)   PMN:  46.6 (06/18/2010)   Lymphs:  44.5 (06/18/2010)   Monos:  6.5 (06/18/2010)   Eosinophils:  2.0 (06/18/2010)   Basophil:  0.4 (06/18/2010)  Complete Metabolic Panel   Glucose:  99 (06/18/2010)   Sodium:  138 (06/18/2010)   Potassium:  4.3 (06/18/2010)   Chloride:  106 (06/18/2010)   CO2:  26 (06/18/2010)   BUN:  14 (06/18/2010)   Creatinine:  1.0 (06/18/2010)   Albumin:  4.2 (06/18/2010)   Total Protein:  6.9 (06/18/2010)   Calcium:  9.3 (06/18/2010)   Total Bili:  0.3 (06/18/2010)   Alk Phos:  50 (06/18/2010)   SGPT (ALT):  24 (06/18/2010)   SGOT (AST):  24 (06/18/2010)   Medications Prior to Update: 1)  Carvedilol 12.5 Mg Tabs (Carvedilol) .[name].. 1 By Mouth Two Times A Day 2)  Furosemide 20 Mg Tabs (Furosemide) .[name].. 1 By Mouth Once Daily 3)  Benazepril Hcl 10 Mg Tabs (Benazepril Hcl) .[name].. 1 By Mouth Once Daily 4)  Aspir-Low 81 Mg Tbec (Aspirin) .[name].. 1po Once Daily 5)  Uloric 80 Mg Tabs (Febuxostat) .[name].. 1po Once  Daily  Current Medications (verified): 1)  Carvedilol 12.5 Mg Tabs (Carvedilol) .[name].. 1 By Mouth Two Times A Day 2)  Furosemide 20 Mg Tabs (Furosemide) .[name].. 1 By Mouth Once Daily 3)  Benazepril Hcl 10 Mg Tabs (Benazepril Hcl) .[name].. 1 By Mouth Once Daily 4)  Aspir-Low 81 Mg Tbec (Aspirin) .[name].. 1po Once Daily 5)  Uloric 80 Mg Tabs (Febuxostat) .[name].. 1po Once Daily 6)  Promethazine Hcl 25 Mg Tabs (Promethazine Hcl) .... 1/2 - 1 By Mouth Qid As Needed For Nausea/vomiting  Allergies (verified): No Known Drug Allergies  Past History:  Past Medical History: Last updated: 07/03/2010 fatty liver Gout Hypertension Allergic rhinitis Colonic polyps, hx of Hyperlipidemia  Past Surgical History: Last updated: 07/01/2009 Denies surgical history  Family History: Last updated: 07/01/2009 mother withg lung cancer father with DM, heart disease with MI at 44yo  aunt with DM uncle with renal disease - dialysis sister with HTN  - died with brain aneurysm/stroke at 56 yo  Social History: Last updated: 07/03/2010 Single no children work - [employer] Never Smoked Alcohol use-yes Drug use-no  Risk Factors: Smoking Status: never (07/17/2010)  Social History: Hepatitis Risk:  no risk noted HIV Risk:  no risk noted STD Risk:  no risk noted Sexual History:  currently monogamous Drug Use:  never Blood Transfusions:  no  Review of Systems  The patient denies anorexia, fever, weight loss, weight gain, chest pain, syncope, dyspnea on exertion, peripheral edema, prolonged cough, headaches, hemoptysis, abdominal pain, melena, hematochezia, severe indigestion/heartburn, hematuria, suspicious skin lesions, transient blindness, enlarged lymph nodes, and angioedema.    Physical Exam  General:  alert, well-developed, well-nourished, well-hydrated, appropriate dress, normal appearance, healthy-appearing, cooperative to examination, good hygiene, and overweight-appearing.   Head:  normocephalic  and atraumatic.   Eyes:  vision grossly intact, pupils equal, and no injection or icterus. Ears:  R ear normal and L ear normal.   Mouth:  Oral mucosa and oropharynx without lesions or exudates.  Teeth in good repair. Neck:  supple, full ROM, no masses, no thyromegaly, no thyroid nodules or tenderness, no JVD, normal carotid upstroke, no carotid bruits, no cervical lymphadenopathy, and no neck tenderness.   Lungs:  normal respiratory effort, no intercostal retractions, no accessory muscle use, normal breath sounds, no dullness, no fremitus, no crackles, and no wheezes.   Heart:  normal rate, regular rhythm, no murmur, no gallop, no rub, and no JVD.   Abdomen:  soft, non-tender, normal bowel sounds, no distention, no masses, no guarding, no rigidity, no rebound tenderness,  no abdominal hernia, no inguinal hernia, no hepatomegaly, and no splenomegaly.   Msk:  normal ROM, no joint tenderness, no joint swelling, no joint warmth, and no redness over joints.   Pulses:  R and L carotid,radial,femoral,dorsalis pedis and posterior tibial pulses are full and equal bilaterally Extremities:  No clubbing, cyanosis, edema, or deformity noted with normal full range of motion of all joints.   Neurologic:  No cranial nerve deficits noted. Station and gait are normal. Plantar reflexes are down-going bilaterally. DTRs are symmetrical throughout. Sensory, motor and coordinative functions appear intact. Skin:  turgor normal, color normal, no rashes, no suspicious lesions, no ecchymoses, no petechiae, no purpura, no ulcerations, no edema, and tattoo(s).   Cervical Nodes:  no anterior cervical adenopathy and no posterior cervical adenopathy.   Axillary Nodes:  no R axillary adenopathy and no L axillary adenopathy.   Inguinal Nodes:  no R inguinal adenopathy and no L inguinal adenopathy.   Psych:  Cognition and judgment appear intact. Alert and cooperative with normal attention span and concentration. No apparent delusions,  illusions, hallucinations   Impression & Recommendations:  Problem # 1:  GASTROENTERITIS (ICD-558.9) Assessment New  Discussed use of medication and role of diet. Encouraged clear liquids and electrolyte replacement fluids. Instructed to call if any signs of worsening dehydration.   Problem # 2:  HYPERTENSION (ICD-401.9) Assessment: Unchanged  His updated medication list for this problem includes:    Carvedilol 12.5 Mg Tabs (Carvedilol) .[name]... 1 by mouth two times a day    Furosemide 20 Mg Tabs (Furosemide) .[name]... 1 by mouth once daily    Benazepril Hcl 10 Mg Tabs (Benazepril hcl) .[name]... 1 by mouth once daily  BP today: 130/90 Prior BP: 132/90 (07/03/2010)  Labs Reviewed: K+: 4.3 (06/18/2010) Creat: : 1.0 (06/18/2010)   Chol: 165 (06/18/2010)   HDL: 28.70 (06/18/2010)   LDL: 123 (07/01/2009)   TG: 252.0 (06/18/2010)  Complete Medication List: 1)  Carvedilol 12.5 Mg Tabs (Carvedilol) .[name].. 1 by mouth two times a day 2)  Furosemide 20 Mg Tabs (Furosemide) .[name].. 1 by mouth once daily 3)  Benazepril Hcl 10 Mg Tabs (Benazepril hcl) .[name].. 1 by mouth once daily 4)  Aspir-low 81 Mg Tbec (Aspirin) .[name].. 1po once daily 5)  Uloric 80 Mg Tabs (Febuxostat) .[name].. 1po once daily 6)  Promethazine Hcl 25 Mg Tabs (Promethazine hcl) .... 1/2 - 1 by mouth qid as needed for nausea/vomiting  Patient Instructions: 1)  Please schedule a follow-up appointment in 2 weeks. 2)  Check your Blood Pressure regularly. If it is above 140/90: you should make an appointment. 3)  teh main problem with gastroenteritis is dehydration. Drink plenty of fluids and take solids as you feel better. If you are unable to keep anything down and/or you show signs of dehydration(dry/cracked lips, lack of tears, not urinating, very sleepy), call our office. Prescriptions: PROMETHAZINE HCL 25 MG TABS (PROMETHAZINE HCL) 1/2 - 1 by mouth QID as needed for nausea/vomiting  #25 x 0   Entered and Authorized by:   Etta Grandchild MD    Signed by:   Etta Grandchild MD on 07/17/2010   Method used:   Electronically to        [facility] [id number] (retail)       [address]       [place], [place]  [address]       Ph: [phone]  Fax: [phone]   RxID:   [id number]    Orders Added: 1)  Est. Patient Level IV [84696]

## 2010-10-15 ENCOUNTER — Other Ambulatory Visit: Payer: Self-pay

## 2010-10-15 MED ORDER — FUROSEMIDE 20 MG PO TABS: 20.0000 mg | ORAL_TABLET | Freq: Every day | ORAL | Status: DC

## 2010-10-15 MED ORDER — FEBUXOSTAT 80 MG PO TABS: 1.0000 | ORAL_TABLET | Freq: Every day | ORAL | Status: DC

## 2010-10-15 MED ORDER — CARVEDILOL 12.5 MG PO TABS: 12.5000 mg | ORAL_TABLET | Freq: Two times a day (BID) | ORAL | Status: DC

## 2010-10-15 MED ORDER — BENAZEPRIL HCL 10 MG PO TABS: 10.0000 mg | ORAL_TABLET | Freq: Every day | ORAL | Status: DC

## 2011-03-21 ENCOUNTER — Other Ambulatory Visit: Payer: Self-pay | Admitting: Internal Medicine

## 2011-09-05 ENCOUNTER — Encounter: Payer: Self-pay | Admitting: Internal Medicine

## 2011-09-05 DIAGNOSIS — Z0000 Encounter for general adult medical examination without abnormal findings: Secondary | ICD-10-CM | POA: Insufficient documentation

## 2011-09-08 ENCOUNTER — Ambulatory Visit (INDEPENDENT_AMBULATORY_CARE_PROVIDER_SITE_OTHER): Payer: BC Managed Care – PPO | Admitting: Internal Medicine

## 2011-09-08 ENCOUNTER — Other Ambulatory Visit (INDEPENDENT_AMBULATORY_CARE_PROVIDER_SITE_OTHER): Payer: BC Managed Care – PPO

## 2011-09-08 ENCOUNTER — Ambulatory Visit: Payer: Self-pay | Admitting: Internal Medicine

## 2011-09-08 ENCOUNTER — Encounter: Payer: Self-pay | Admitting: Internal Medicine

## 2011-09-08 VITALS — BP 110/70 | HR 89 | Temp 98.3°F | Ht 71.0 in | Wt 204.5 lb

## 2011-09-08 DIAGNOSIS — Z0000 Encounter for general adult medical examination without abnormal findings: Secondary | ICD-10-CM

## 2011-09-08 LAB — CBC WITH DIFFERENTIAL/PLATELET
Basophils Absolute: 0 10*3/uL (ref 0.0–0.1)
Eosinophils Absolute: 0.3 10*3/uL (ref 0.0–0.7)
HCT: 48.5 % (ref 39.0–52.0)
Hemoglobin: 15.6 g/dL (ref 13.0–17.0)
Lymphs Abs: 2.5 10*3/uL (ref 0.7–4.0)
MCHC: 32.1 g/dL (ref 30.0–36.0)
Monocytes Absolute: 0.8 10*3/uL (ref 0.1–1.0)
Neutro Abs: 7.1 10*3/uL (ref 1.4–7.7)
RDW: 14 % (ref 11.5–14.6)

## 2011-09-08 LAB — URINALYSIS, ROUTINE W REFLEX MICROSCOPIC
Hgb urine dipstick: NEGATIVE
Ketones, ur: NEGATIVE
Urine Glucose: NEGATIVE
Urobilinogen, UA: 0.2 (ref 0.0–1.0)

## 2011-09-08 LAB — HEPATIC FUNCTION PANEL
Alkaline Phosphatase: 41 U/L (ref 39–117)
Bilirubin, Direct: 0.1 mg/dL (ref 0.0–0.3)

## 2011-09-08 LAB — BASIC METABOLIC PANEL
CO2: 29 mEq/L (ref 19–32)
Calcium: 9.7 mg/dL (ref 8.4–10.5)
GFR: 78 mL/min (ref >60.00)
Potassium: 4.9 mEq/L (ref 3.5–5.1)
Sodium: 139 mEq/L (ref 135–145)

## 2011-09-08 LAB — LIPID PANEL
HDL: 34.6 mg/dL — ABNORMAL LOW (ref >39.00)
Total CHOL/HDL Ratio: 5
VLDL: 27.4 mg/dL (ref 0.0–40.0)

## 2011-09-08 MED ORDER — ASPIRIN 81 MG PO TBEC: 81.0000 mg | DELAYED_RELEASE_TABLET | Freq: Every day | ORAL | Status: AC

## 2011-09-08 MED ORDER — FUROSEMIDE 20 MG PO TABS: 20.0000 mg | ORAL_TABLET | Freq: Every day | ORAL | Status: DC

## 2011-09-08 MED ORDER — FEBUXOSTAT 80 MG PO TABS: 1.0000 | ORAL_TABLET | Freq: Every day | ORAL | Status: DC

## 2011-09-08 MED ORDER — CARVEDILOL 12.5 MG PO TABS: 12.5000 mg | ORAL_TABLET | Freq: Two times a day (BID) | ORAL | Status: DC

## 2011-09-08 MED ORDER — BENAZEPRIL HCL 10 MG PO TABS: 10.0000 mg | ORAL_TABLET | Freq: Every day | ORAL | Status: DC

## 2011-09-08 NOTE — Patient Instructions (Signed)
Please start the Aspirin 81 mg - 1 per day - COATED only, to reduce risk of stroke and heart attack You are given the medication refills today hardcopy Please go to LAB in the Basement for the blood and/or urine tests to be done today You will be contacted by phone if any changes need to be made immediately.  Otherwise, you will receive a letter about your results with an explanation. Please return in 1 year for your yearly visit, or sooner if needed, with Lab testing done 3-5 days before

## 2011-09-08 NOTE — Assessment & Plan Note (Signed)

## 2011-09-08 NOTE — Progress Notes (Signed)
Subjective:    Patient ID: Matthew Guerrero, male    DOB: 1955/11/11, 56 y.o.   MRN: [id number]  HPI  Here for wellness and f/u;  Overall doing ok;  Pt denies CP, worsening SOB, DOE, wheezing, orthopnea, PND, worsening LE edema, palpitations, dizziness or syncope.  Pt denies neurological change such as new Headache, facial or extremity weakness.  Pt denies polydipsia, polyuria, or low sugar symptoms. Pt states overall good compliance with treatment and medications, good tolerability, and trying to follow lower cholesterol diet.  Pt denies worsening depressive symptoms, suicidal ideation or panic. No fever, wt loss, night sweats, loss of appetite, or other constitutional symptoms.  Pt states good ability with ADL's, low fall risk, home safety reviewed and adequate, no significant changes in hearing or vision, and occasionally active with exercise.  Pk wt has been 330 in the past, was approx 220 2012, now 214 with more physical work.   Past Medical History  Diagnosis Date  . COLONIC POLYPS, HX OF 07/01/2009    Qualifier: Diagnosis of  By: Jonny Ruiz MD, Len Blalock   . ALLERGIC RHINITIS 07/01/2009    Qualifier: Diagnosis of  By: Jonny Ruiz MD, Len Blalock   . GOUT 07/01/2009    Qualifier: Diagnosis of  By: Jonny Ruiz MD, Len Blalock   . HYPERLIPIDEMIA 07/03/2010    Qualifier: Diagnosis of  By: Jonny Ruiz MD, Len Blalock   . HYPERTENSION 07/01/2009    Qualifier: Diagnosis of  By: Jonny Ruiz MD, Len Blalock    History reviewed. No pertinent past surgical history.  reports that he has never smoked. He does not have any smokeless tobacco history on file. He reports that he drinks alcohol. He reports that he does not use illicit drugs. family history includes Cancer in his mother; Diabetes in his father; and Heart disease in his father. No Known Allergies No current outpatient prescriptions on file prior to visit.    Review of Systems Review of Systems  Constitutional: Negative for diaphoresis, activity change, appetite change and unexpected weight  change.  HENT: Negative for hearing loss, ear pain, facial swelling, mouth sores and neck stiffness.   Eyes: Negative for pain, redness and visual disturbance.  Respiratory: Negative for shortness of breath and wheezing.   Cardiovascular: Negative for chest pain and palpitations.  Gastrointestinal: Negative for diarrhea, blood in stool, abdominal distention and rectal pain.  Genitourinary: Negative for hematuria, flank pain and decreased urine volume.  Musculoskeletal: Negative for myalgias and joint swelling.  Skin: Negative for color change and wound.  Neurological: Negative for syncope and numbness.  Hematological: Negative for adenopathy.  Psychiatric/Behavioral: Negative for hallucinations, self-injury, decreased concentration and agitation.      Objective:   Physical Exam BP 110/70  Pulse 89  Temp(Src) 98.3 F (36.8 C) (Oral)  Ht 5\' 11"  (1.803 m)  Wt 204 lb 8 oz (92.761 kg)  BMI 28.52 kg/m2  SpO2 94% Physical Exam  VS noted Constitutional: Pt is oriented to person, place, and time. Appears well-developed and well-nourished.  HENT:  Head: Normocephalic and atraumatic.  Right Ear: External ear normal.  Left Ear: External ear normal.  Nose: Nose normal.  Mouth/Throat: Oropharynx is clear and moist.  Eyes: Conjunctivae and EOM are normal. Pupils are equal, round, and reactive to light.  Neck: Normal range of motion. Neck supple. No JVD present. No tracheal deviation present.  Cardiovascular: Normal rate, regular rhythm, normal heart sounds and intact distal pulses.   Pulmonary/Chest: Effort normal and breath sounds normal.  Abdominal: Soft. Bowel  sounds are normal. There is no tenderness.  Musculoskeletal: Normal range of motion. Exhibits no edema.  Lymphadenopathy:  Has no cervical adenopathy.  Neurological: Pt is alert and oriented to person, place, and time. Pt has normal reflexes. No cranial nerve deficit.  Skin: Skin is warm and dry. No rash noted.  Psychiatric:  Has   normal mood and affect. Behavior is normal.     Assessment & Plan:

## 2011-12-02 ENCOUNTER — Emergency Department (HOSPITAL_COMMUNITY)
Admission: EM | Admit: 2011-12-02 | Discharge: 2011-12-02 | Disposition: A | Discharge status: Home / Self Care | Payer: BC Managed Care – PPO | Attending: Emergency Medicine | Admitting: Emergency Medicine

## 2011-12-02 ENCOUNTER — Encounter (HOSPITAL_COMMUNITY): Payer: Self-pay | Admitting: *Deleted

## 2011-12-02 ENCOUNTER — Telehealth: Payer: Self-pay | Admitting: Internal Medicine

## 2011-12-02 ENCOUNTER — Emergency Department (HOSPITAL_COMMUNITY): Payer: BC Managed Care – PPO

## 2011-12-02 DIAGNOSIS — Z79899 Other long term (current) drug therapy: Secondary | ICD-10-CM | POA: Insufficient documentation

## 2011-12-02 DIAGNOSIS — R209 Unspecified disturbances of skin sensation: Secondary | ICD-10-CM | POA: Insufficient documentation

## 2011-12-02 DIAGNOSIS — M542 Cervicalgia: Secondary | ICD-10-CM | POA: Insufficient documentation

## 2011-12-02 DIAGNOSIS — M79609 Pain in unspecified limb: Secondary | ICD-10-CM | POA: Insufficient documentation

## 2011-12-02 DIAGNOSIS — G629 Polyneuropathy, unspecified: Secondary | ICD-10-CM

## 2011-12-02 LAB — BASIC METABOLIC PANEL
BUN: 17 mg/dL (ref 6–23)
CO2: 27 mEq/L (ref 19–32)
Chloride: 102 mEq/L (ref 96–112)
Creatinine, Ser: 1.09 mg/dL (ref 0.50–1.35)
GFR calc Af Amer: 85 mL/min — ABNORMAL LOW (ref >90)

## 2011-12-02 LAB — CBC
HCT: 48.2 % (ref 39.0–52.0)
MCV: 85.3 fL (ref 78.0–100.0)
RDW: 13.7 % (ref 11.5–15.5)
WBC: 5.9 10*3/uL (ref 4.0–10.5)

## 2011-12-02 MED ORDER — IBUPROFEN 400 MG PO TABS: 400.0000 mg | ORAL_TABLET | Freq: Four times a day (QID) | ORAL | Status: AC | PRN

## 2011-12-02 MED ORDER — ASPIRIN 325 MG PO TABS: 325.0000 mg | ORAL_TABLET | ORAL | Status: DC

## 2011-12-02 MED ORDER — SODIUM CHLORIDE 0.9 % IJ SOLN: 3.0000 mL | INTRAMUSCULAR | Status: DC

## 2011-12-02 MED ORDER — ASPIRIN 81 MG PO CHEW
CHEWABLE_TABLET | ORAL | Status: AC
  Administered 2011-12-02: 243 mg
  Filled 2011-12-02: qty 3

## 2011-12-02 MED ORDER — NITROGLYCERIN 0.4 MG SL SUBL: 0.4000 mg | SUBLINGUAL_TABLET | SUBLINGUAL | Status: DC | PRN

## 2011-12-02 MED ORDER — ASPIRIN 81 MG PO CHEW: 81.0000 mg | CHEWABLE_TABLET | Freq: Once | ORAL | Status: DC

## 2011-12-02 NOTE — ED Provider Notes (Signed)
History     CSN: [id number] Arrival date & time 12/02/11  1303 First MD Initiated Contact with Patient 12/02/11 1346    Chief Complaint  Patient presents with  . Arm Pain   HPI Pt has been having intermittent pain in his left arm.  The discomfort is a tingling.  No chest pain.  No shortness of breath.  No increase with exertion.   The tingling is in the upper medial arm running towards the forearm.  Nl strength.  No neck pain although at times it is sore.    No other symptoms.   Past Medical History  Diagnosis Date  . COLONIC POLYPS, HX OF 07/01/2009    Qualifier: Diagnosis of  By: Jonny Ruiz MD, Len Blalock   . ALLERGIC RHINITIS 07/01/2009    Qualifier: Diagnosis of  By: Jonny Ruiz MD, Len Blalock   . GOUT 07/01/2009    Qualifier: Diagnosis of  By: Jonny Ruiz MD, Len Blalock   . HYPERLIPIDEMIA 07/03/2010    Qualifier: Diagnosis of  By: Jonny Ruiz MD, Len Blalock   . HYPERTENSION 07/01/2009    Qualifier: Diagnosis of  By: Jonny Ruiz MD, Len Blalock     History reviewed. No pertinent past surgical history.  Family History  Problem Relation Age of Onset  . Cancer Mother   . Diabetes Father   . Heart disease Father     History  Substance Use Topics  . Smoking status: Never Smoker   . Smokeless tobacco: Not on file  . Alcohol Use: Yes     social      Review of Systems  Respiratory: Negative for chest tightness.   Cardiovascular: Negative for chest pain.  Neurological: Negative for tremors, syncope, speech difficulty, weakness and numbness.  All other systems reviewed and are negative.    Allergies  Review of patient's allergies indicates no known allergies.  Home Medications   Current Outpatient Rx  Name Route Sig Dispense Refill  . ASPIRIN 81 MG PO TBEC Oral Take 1 tablet (81 mg total) by mouth daily. Swallow whole. 30 tablet 12  . BENAZEPRIL HCL 10 MG PO TABS Oral Take 1 tablet (10 mg total) by mouth daily. 90 tablet 3  . CARVEDILOL 12.5 MG PO TABS Oral Take 1 tablet (12.5 mg total) by mouth 2 (two) times daily.  180 tablet 3  . FEBUXOSTAT 80 MG PO TABS Oral Take 1 tablet (80 mg total) by mouth daily. 90 tablet 3  . FUROSEMIDE 20 MG PO TABS Oral Take 1 tablet (20 mg total) by mouth daily. 90 tablet 3    BP 144/93  Pulse 82  Temp 98.8 F (37.1 C) (Oral)  Resp 18  SpO2 100%  Physical Exam  Nursing note and vitals reviewed. Constitutional: He appears well-developed and well-nourished. No distress.  HENT:  Head: Normocephalic and atraumatic.  Right Ear: External ear normal.  Left Ear: External ear normal.  Eyes: Conjunctivae are normal. Right eye exhibits no discharge. Left eye exhibits no discharge. No scleral icterus.  Neck: Neck supple. No tracheal deviation present.  Cardiovascular: Normal rate, regular rhythm and intact distal pulses.   Pulmonary/Chest: Effort normal and breath sounds normal. No stridor. No respiratory distress. He has no wheezes. He has no rales.  Abdominal: Soft. Bowel sounds are normal. He exhibits no distension. There is no tenderness. There is no rebound and no guarding.  Musculoskeletal: He exhibits no edema and no tenderness.       Cervical back: He exhibits tenderness (parapsinal). He exhibits normal  range of motion, no swelling and no edema.       Strong radial pulse bilaterally, 5//5 strength; nl sensation;   Neurological: He is alert. He has normal strength. No sensory deficit. Cranial nerve deficit:  no gross defecits noted. He exhibits normal muscle tone. He displays no seizure activity. Coordination normal.  Skin: Skin is warm and dry. No rash noted.  Psychiatric: He has a normal mood and affect.    ED Course  Procedures (including critical care time)  Rate: 86  Rhythm: normal sinus rhythm  QRS Axis: normal  Intervals: normal  ST/T Wave abnormalities: Borderline T-wave changes  Conduction Disutrbances:none  Narrative Interpretation: Consider left ventricular hypertrophy, no signs of acute ischemia  Old EKG Reviewed: none available  Labs Reviewed  CBC  - Abnormal; Notable for the following:    Platelets 146 (*)     All other components within normal limits  BASIC METABOLIC PANEL - Abnormal; Notable for the following:    GFR calc non Af Amer 74 (*)     GFR calc Af Amer 85 (*)     All other components within normal limits   Dg Chest 2 View  12/02/2011  *RADIOLOGY REPORT*  Clinical Data: Right neck and arm pain for 2 days.  No known injury.  CHEST - 2 VIEW  Comparison: None.  Findings: The heart size and mediastinal contours are normal. The lungs are clear. There is no pleural effusion or pneumothorax. No acute osseous findings are identified.  Telemetry leads overlie the chest.  IMPRESSION: No active cardiopulmonary process.  Original Report Authenticated By: Gerrianne Scale, M.D.   Dg Cervical Spine Complete  12/02/2011  *RADIOLOGY REPORT*  Clinical Data: Right neck and arm pain for 2 days without injury.  CERVICAL SPINE - COMPLETE 4+ VIEW  Comparison: None.  Findings: Spurring anterior to the vertebral body column is present multiple levels, most prominently at C3-4.  No significant vertebral subluxation.  No prevertebral soft tissue swelling noted.  No significant loss of intervertebral disc space or significant osseous foraminal stenosis noted.  Facet arthropathy is present at the C6-7 level.  IMPRESSION:  1.  Mild cervical spondylosis.   Otherwise, no significant abnormality identified.  Original Report Authenticated By: Dellia Cloud, M.D.      MDM  The patient presents to the emergency room with complaints of a tingling sensation in his left upper extremity. He's not having any complaints of chest pain or shortness of breath. His symptoms are most likely related to a radiculopathy. His cervical spine films do show signs of mild cervical spondylosis but otherwise no significant abnormality. I have reassured the patient that at this time there does not appear to be evidence to suggest a stroke or heart attack. Irecommended he follow  up with his primary Dr. to consider further outpatient testing such as MRI.  At this time there does not appear to be any evidence of an acute emergency medical condition and the patient appears stable for discharge with appropriate outpatient follow up.         Celene Kras, MD 12/02/11 [phone]

## 2011-12-02 NOTE — Discharge Instructions (Signed)
Peripheral Nerve Problems Peripheral nerve disorders are problems with the nerves in your arms or legs. CAUSES  There are many different causes of these disorders. They include:  Injury.   Diabetes.   Chronic alcoholism.   Toxic chemicals and drugs.   Vitamin deficiencies.   Tumors.   Liver or kidney diseases.  SYMPTOMS  Some of the problems caused are:  Tingling, burning, pain, and numbness in the extremities and feet.   Weakness, loss of muscle tone, and size.  DIAGNOSIS  Sometimes blood tests and studies to examine nerve function are needed to make the diagnosis.  TREATMENT  Sometimes peripheral nerve problems can be treated with vitamins, medication, and avoiding known toxins such as alcohol. Please make a follow-up appointment to be sure you are getting better with treatment.  SEEK MEDICAL CARE IF:   You are not better after one week of treatment.   You have worsening of problems or have breathing trouble.  Document Released: 07/16/2004 Document Revised: 05/28/2011 Document Reviewed: 06/08/2005 [facility] Patient Information 2012 [place], [place].

## 2011-12-02 NOTE — ED Notes (Signed)
Pt presenting to ed with c/o left sided chest pain x 2 days that was worse today while he was at work. Pt states no nausea, vomiting or shortness of breath at this time. Pt states minimal pain at this time. Pt states he had left arm numbness. Pt is alert and oriented at this time.

## 2011-12-02 NOTE — Telephone Encounter (Signed)
EMERGENT Caller: Broderic/Patient; PCP: Oliver Barre; CB#: [phone];  Call regarding Shooting Pain in Left Arm;  Reports L arm feels cold.  Onset: 11/30/11.  Afebrile.  No known injury.  Reports pain in L upper arm is worse.  Able to move arm. Mild diaphoresis present over past couple of days.  Mild tingling in upper arm radiating below elbow to hand.  Advised to call 911 now for new unexplained change in sensation per Arm Non Injury guideline.

## 2011-12-02 NOTE — ED Notes (Signed)
C/o left arm pain intermitten 2 days, woke with sweating last two mornings.denies other symptoms

## 2012-01-01 ENCOUNTER — Ambulatory Visit (INDEPENDENT_AMBULATORY_CARE_PROVIDER_SITE_OTHER): Payer: BC Managed Care – PPO | Admitting: Internal Medicine

## 2012-01-01 ENCOUNTER — Encounter: Payer: Self-pay | Admitting: Internal Medicine

## 2012-01-01 VITALS — BP 120/90 | HR 78 | Temp 99.5°F | Ht 70.0 in | Wt 206.0 lb

## 2012-01-01 DIAGNOSIS — M7711 Lateral epicondylitis, right elbow: Secondary | ICD-10-CM

## 2012-01-01 DIAGNOSIS — M109 Gout, unspecified: Secondary | ICD-10-CM

## 2012-01-01 DIAGNOSIS — M7710 Lateral epicondylitis, unspecified elbow: Secondary | ICD-10-CM

## 2012-01-01 DIAGNOSIS — M5412 Radiculopathy, cervical region: Secondary | ICD-10-CM

## 2012-01-01 DIAGNOSIS — I10 Essential (primary) hypertension: Secondary | ICD-10-CM

## 2012-01-01 MED ORDER — PREDNISONE 10 MG PO TABS: 10.0000 mg | ORAL_TABLET | Freq: Every day | ORAL | Status: DC

## 2012-01-01 MED ORDER — TRAMADOL HCL 50 MG PO TABS: 50.0000 mg | ORAL_TABLET | Freq: Four times a day (QID) | ORAL | Status: AC | PRN

## 2012-01-01 MED ORDER — FEBUXOSTAT 80 MG PO TABS: 1.0000 | ORAL_TABLET | Freq: Every day | ORAL | Status: DC

## 2012-01-01 MED ORDER — FUROSEMIDE 20 MG PO TABS: 20.0000 mg | ORAL_TABLET | Freq: Every day | ORAL | Status: DC

## 2012-01-01 MED ORDER — CARVEDILOL 12.5 MG PO TABS: 12.5000 mg | ORAL_TABLET | Freq: Two times a day (BID) | ORAL | Status: DC

## 2012-01-01 MED ORDER — TRAMADOL HCL 50 MG PO TABS: 50.0000 mg | ORAL_TABLET | Freq: Four times a day (QID) | ORAL | Status: DC | PRN

## 2012-01-01 MED ORDER — BENAZEPRIL HCL 10 MG PO TABS: 10.0000 mg | ORAL_TABLET | Freq: Every day | ORAL | Status: DC

## 2012-01-01 NOTE — Patient Instructions (Addendum)
Your uloric prescription was faxed today Take all new medications as prescribed - the prednisone Continue all other medications as before, such as the tramadol (refilled today) Please have the pharmacy call with any refills you may need. No need for further blood work today Please continue your efforts at being more active, low cholesterol diet, and weight control. Please return in 6 mo with Lab testing done 3-5 days before

## 2012-01-02 ENCOUNTER — Encounter: Payer: Self-pay | Admitting: Internal Medicine

## 2012-01-02 DIAGNOSIS — M5412 Radiculopathy, cervical region: Secondary | ICD-10-CM | POA: Insufficient documentation

## 2012-01-02 DIAGNOSIS — M7711 Lateral epicondylitis, right elbow: Secondary | ICD-10-CM | POA: Insufficient documentation

## 2012-01-02 NOTE — Progress Notes (Signed)
Subjective:    Patient ID: Matthew Guerrero, male    DOB: 08-May-1956, 56 y.o.   MRN: [id number]  HPI  Here to f/u after being seen in ER, still with mild to mod burning pain to left neck with some radiation to the left trapezoid area, but not shoulder or arm, worse to move neck/turn head, nothing makes better , no numbness or weakness, needs tramadol refill as it works ok.  Pt denies chest pain, increased sob or doe, wheezing, orthopnea, PND, increased LE swelling, palpitations, dizziness or syncope.   Pt denies polydipsia, polyuria.  Pt denies new neurological symptoms such as new headache, or facial or extremity weakness or numbness  Does also have mild right tender to right lateral epicondylar area, works with hands at work every day, nothing makes better or worse, no trauma or swelling.   Pt denies fever, wt loss, night sweats, loss of appetite, or other constitutional symptoms Past Medical History  Diagnosis Date  . COLONIC POLYPS, HX OF 07/01/2009    Qualifier: Diagnosis of  By: Jonny Ruiz MD, Len Blalock   . ALLERGIC RHINITIS 07/01/2009    Qualifier: Diagnosis of  By: Jonny Ruiz MD, Len Blalock   . GOUT 07/01/2009    Qualifier: Diagnosis of  By: Jonny Ruiz MD, Len Blalock   . HYPERLIPIDEMIA 07/03/2010    Qualifier: Diagnosis of  By: Jonny Ruiz MD, Len Blalock   . HYPERTENSION 07/01/2009    Qualifier: Diagnosis of  By: Jonny Ruiz MD, Len Blalock    No past surgical history on file.  reports that he has never smoked. He does not have any smokeless tobacco history on file. He reports that he drinks alcohol. He reports that he does not use illicit drugs. family history includes Cancer in his mother; Diabetes in his father; and Heart disease in his father. No Known Allergies Current Outpatient Prescriptions on File Prior to Visit  Medication Sig Dispense Refill  . aspirin 81 MG EC tablet Take 1 tablet (81 mg total) by mouth daily. Swallow whole.  30 tablet  12  . benazepril (LOTENSIN) 10 MG tablet Take 1 tablet (10 mg total) by mouth daily.   90 tablet  3  . carvedilol (COREG) 12.5 MG tablet Take 1 tablet (12.5 mg total) by mouth 2 (two) times daily.  180 tablet  3  . Febuxostat 80 MG TABS Take 1 tablet (80 mg total) by mouth daily.  90 tablet  3  . furosemide (LASIX) 20 MG tablet Take 1 tablet (20 mg total) by mouth daily.  90 tablet  3   Review of Systems Constitutional: Negative for diaphoresis and unexpected weight change.  HENT: Negative tinnitus.   Eyes: Negative for photophobia and visual disturbance.  Respiratory: Negative for choking and stridor.   Gastrointestinal: Negative for vomiting and blood in stool.  Genitourinary: Negative for hematuria and decreased urine volume.  Musculoskeletal: Negative for gait problem.  Skin: Negative for color change and wound.  Neurological: Negative for tremors and numbness.     Objective:   Physical Exam BP 120/90  Pulse 78  Temp 99.5 F (37.5 C) (Oral)  Ht 5\' 10"  (1.778 m)  Wt 206 lb (93.441 kg)  BMI 29.56 kg/m2  SpO2 94% Physical Exam  VS noted, not ill appearing Constitutional: Pt appears well-developed and well-nourished.  HENT: Head: Normocephalic.  Right Ear: External ear normal.  Left Ear: External ear normal.  Eyes: Conjunctivae and EOM are normal. Pupils are equal, round, and reactive to light.  Neck: Normal range  of motion. Neck supple.  Cardiovascular: Normal rate and regular rhythm.   Pulmonary/Chest: Effort normal and breath sounds normal.  Right elbow with mild tender to right epicondylar area, no sweling Neurological: Pt is alert. No cranial nerve deficit. motor/sens/dtr intact MSK: no joint sweling or gender Skin: Skin is warm. No erythema.  Psychiatric: Pt behavior is normal. Thought content normal.     Assessment & Plan:

## 2012-01-02 NOTE — Assessment & Plan Note (Signed)
stable overall by hx and exam, most recent data reviewed with pt, and pt to continue medical treatment as before, rx faxed to pt assist program as requested

## 2012-01-02 NOTE — Assessment & Plan Note (Signed)
Exam benign, for predpack asd, and tramadol prn,  to f/u any worsening symptoms or concerns

## 2012-01-02 NOTE — Assessment & Plan Note (Signed)
stable overall by hx and exam, most recent data reviewed with pt, and pt to continue medical treatment as before BP Readings from Last 3 Encounters:  01/01/12 120/90  12/02/11 163/116  09/08/11 110/70

## 2012-01-02 NOTE — Assessment & Plan Note (Signed)
Mild, for predpack as for neck issue today, tylenol prn,  to f/u any worsening symptoms or concerns

## 2012-03-11 ENCOUNTER — Telehealth: Payer: Self-pay | Admitting: Internal Medicine

## 2012-03-11 NOTE — Telephone Encounter (Signed)
Caller: [name]/Patient; Patient Name: Matthew Guerrero; PCP: Oliver Barre (Adults only); Best Callback Phone Number: [phone] Calling regarding possible sinus infection, yellow nasal discharge, nasal congestion that started 03/09/12. Taking Tylenol for headache with relief. Wants something called in. Emergent signs and symptoms ruled out as per Upper Respiratory Infection protocol except for see in 24 hours due to productive cough with colored sputum. Offered appointment for either 9/20 or 9/21, states he is unable to come in and he will call back, call back parameters given. Informed office will not call in antibiotics, needs appointment.

## 2012-04-22 ENCOUNTER — Encounter: Payer: Self-pay | Admitting: Gastroenterology

## 2012-04-25 ENCOUNTER — Encounter: Payer: Self-pay | Admitting: Gastroenterology

## 2012-04-28 ENCOUNTER — Encounter: Payer: Self-pay | Admitting: Gastroenterology

## 2012-04-28 ENCOUNTER — Ambulatory Visit (INDEPENDENT_AMBULATORY_CARE_PROVIDER_SITE_OTHER): Payer: BC Managed Care – PPO

## 2012-04-28 ENCOUNTER — Ambulatory Visit (AMBULATORY_SURGERY_CENTER): Payer: BC Managed Care – PPO | Admitting: *Deleted

## 2012-04-28 VITALS — Ht 71.0 in | Wt 216.0 lb

## 2012-04-28 DIAGNOSIS — Z86010 Personal history of colonic polyps: Secondary | ICD-10-CM

## 2012-04-28 DIAGNOSIS — Z23 Encounter for immunization: Secondary | ICD-10-CM

## 2012-04-28 DIAGNOSIS — Z1211 Encounter for screening for malignant neoplasm of colon: Secondary | ICD-10-CM

## 2012-04-28 MED ORDER — PEG-KCL-NACL-NASULF-NA ASC-C 100 G PO SOLR: ORAL | Status: DC

## 2012-05-06 ENCOUNTER — Encounter: Payer: Self-pay | Admitting: Gastroenterology

## 2012-05-06 ENCOUNTER — Ambulatory Visit (AMBULATORY_SURGERY_CENTER): Payer: BC Managed Care – PPO | Admitting: Gastroenterology

## 2012-05-06 VITALS — BP 139/98 | HR 79 | Temp 97.9°F | Resp 18 | Ht 71.0 in | Wt 216.0 lb

## 2012-05-06 DIAGNOSIS — K5730 Diverticulosis of large intestine without perforation or abscess without bleeding: Secondary | ICD-10-CM

## 2012-05-06 DIAGNOSIS — Z1211 Encounter for screening for malignant neoplasm of colon: Secondary | ICD-10-CM

## 2012-05-06 DIAGNOSIS — Z86010 Personal history of colonic polyps: Secondary | ICD-10-CM

## 2012-05-06 MED ORDER — SODIUM CHLORIDE 0.9 % IV SOLN: 500.0000 mL | INTRAVENOUS | Status: DC

## 2012-05-06 NOTE — Op Note (Signed)
[facility] [address] [place], [address]   COLONOSCOPY PROCEDURE REPORT  PATIENT: Matthew Guerrero, Matthew Guerrero  MR#: [id number] BIRTHDATE: 1956/06/16 , 56  yrs. old GENDER: Male ENDOSCOPIST: Rachael Fee, MD PROCEDURE DATE:  05/06/2012 PROCEDURE:   Colonoscopy, diagnostic ASA CLASS:   Class II INDICATIONS:Colonoscopy 2010 (Tyan Lasure) found 5 small polyps, some were adenomatous. MEDICATIONS: Fentanyl 75 mcg IV, Versed 5 mg IV, and These medications were titrated to patient response per physician's verbal order  DESCRIPTION OF PROCEDURE:   After the risks benefits and alternatives of the procedure were thoroughly explained, informed consent was obtained.  A digital rectal exam revealed no abnormalities of the rectum.   The Pentax Ped Colon [id number] endoscope was introduced through the anus and advanced to the cecum, which was identified by both the appendix and ileocecal valve. No adverse events experienced.   The quality of the prep was good, using MoviPrep  The instrument was then slowly withdrawn as the colon was fully examined.  COLON FINDINGS: Mild diverticulosis was noted The finding was in the left colon.   The colon mucosa was otherwise normal.  Retroflexed views revealed no abnormalities. The time to cecum=2 minutes 26 seconds.  Withdrawal time=12 minutes 30 seconds.  The scope was withdrawn and the procedure completed. COMPLICATIONS: There were no complications.  ENDOSCOPIC IMPRESSION: 1.   Mild diverticulosis was noted in the left colon 2.   The colon mucosa was otherwise normal  RECOMMENDATIONS: Given your personal history of adenomatous (pre-cancerous) polyps, you will need a repeat colonoscopy in 5 years.   eSigned:  Rachael Fee, MD 05/06/2012 10:35 AM   cc: Corwin Levins, MD

## 2012-05-06 NOTE — Progress Notes (Addendum)
Patient did not have preoperative order for IV antibiotic SSI prophylaxis. (G8918)  Patient did not experience any of the following events: a burn prior to discharge; a fall within the facility; wrong site/side/patient/procedure/implant event; or a hospital transfer or hospital admission upon discharge from the facility. (G8907)  

## 2012-05-06 NOTE — Patient Instructions (Addendum)
YOU HAD AN ENDOSCOPIC PROCEDURE TODAY AT THE [facility]: Refer to the procedure report that was given to you for any specific questions about what was found during the examination.  If the procedure report does not answer your questions, please call your gastroenterologist to clarify.  If you requested that your care partner not be given the details of your procedure findings, then the procedure report has been included in a sealed envelope for you to review at your convenience later.  YOU SHOULD EXPECT: Some feelings of bloating in the abdomen. Passage of more gas than usual.  Walking can help get rid of the air that was put into your GI tract during the procedure and reduce the bloating. If you had a lower endoscopy (such as a colonoscopy or flexible sigmoidoscopy) you may notice spotting of blood in your stool or on the toilet paper. If you underwent a bowel prep for your procedure, then you may not have a normal bowel movement for a few days.  DIET: Your first meal following the procedure should be a light meal and then it is ok to progress to your normal diet.  A half-sandwich or bowl of soup is an example of a good first meal.  Heavy or fried foods are harder to digest and may make you feel nauseous or bloated.  Likewise meals heavy in dairy and vegetables can cause extra gas to form and this can also increase the bloating.  Drink plenty of fluids but you should avoid alcoholic beverages for 24 hours.  ACTIVITY: Your care partner should take you home directly after the procedure.  You should plan to take it easy, moving slowly for the rest of the day.  You can resume normal activity the day after the procedure however you should NOT DRIVE or use heavy machinery for 24 hours (because of the sedation medicines used during the test).    SYMPTOMS TO REPORT IMMEDIATELY: A gastroenterologist can be reached at any hour.  During normal business hours, 8:30 AM to 5:00 PM Monday through Friday,  call [phone].  After hours and on weekends, please call the GI answering service at [phone] who will take a message and have the physician on call contact you.   Following lower endoscopy (colonoscopy or flexible sigmoidoscopy):  Excessive amounts of blood in the stool  Significant tenderness or worsening of abdominal pains  Swelling of the abdomen that is new, acute  Fever of 100F or higher  FOLLOW UP: If any biopsies were taken you will be contacted by phone or by letter within the next 1-3 weeks.  Call your gastroenterologist if you have not heard about the biopsies in 3 weeks.  Our staff will call the home number listed on your records the next business day following your procedure to check on you and address any questions or concerns that you may have at that time regarding the information given to you following your procedure. This is a courtesy call and so if there is no answer at the home number and we have not heard from you through the emergency physician on call, we will assume that you have returned to your regular daily activities without incident.  SIGNATURES/CONFIDENTIALITY: You and/or your care partner have signed paperwork which will be entered into your electronic medical record.  These signatures attest to the fact that that the information above on your After Visit Summary has been reviewed and is understood.  Full responsibility of the confidentiality of this   discharge information lies with you and/or your care-partner.   Thank-you for choosing us for your healthcare needs. 

## 2012-05-09 ENCOUNTER — Telehealth: Payer: Self-pay | Admitting: *Deleted

## 2012-05-09 NOTE — Telephone Encounter (Signed)
  Follow up Call-  Call back number 05/06/2012  Post procedure Call Back phone  # [phone]  Permission to leave phone message Yes     No answer,left message.

## 2012-06-09 ENCOUNTER — Other Ambulatory Visit (INDEPENDENT_AMBULATORY_CARE_PROVIDER_SITE_OTHER): Payer: BC Managed Care – PPO

## 2012-06-09 DIAGNOSIS — Z0000 Encounter for general adult medical examination without abnormal findings: Secondary | ICD-10-CM

## 2012-06-09 LAB — CBC WITH DIFFERENTIAL/PLATELET
Basophils Relative: 0.6 % (ref 0.0–3.0)
Eosinophils Relative: 2.3 % (ref 0.0–5.0)
HCT: 46.8 % (ref 39.0–52.0)
MCV: 84.9 fl (ref 78.0–100.0)
Monocytes Absolute: 0.5 10*3/uL (ref 0.1–1.0)
Monocytes Relative: 7.2 % (ref 3.0–12.0)
Neutrophils Relative %: 44.5 % (ref 43.0–77.0)
RBC: 5.51 Mil/uL (ref 4.22–5.81)
WBC: 6.8 10*3/uL (ref 4.5–10.5)

## 2012-06-09 LAB — BASIC METABOLIC PANEL
BUN: 15 mg/dL (ref 6–23)
Calcium: 9.4 mg/dL (ref 8.4–10.5)
Creatinine, Ser: 1.1 mg/dL (ref 0.4–1.5)
GFR: 93.37 mL/min (ref >60.00)
Potassium: 4.7 mEq/L (ref 3.5–5.1)

## 2012-06-09 LAB — HEPATIC FUNCTION PANEL
ALT: 38 U/L (ref 0–53)
AST: 35 U/L (ref 0–37)
Bilirubin, Direct: 0.1 mg/dL (ref 0.0–0.3)
Total Protein: 7.1 g/dL (ref 6.0–8.3)

## 2012-06-09 LAB — URINALYSIS, ROUTINE W REFLEX MICROSCOPIC
Bilirubin Urine: NEGATIVE
Hgb urine dipstick: NEGATIVE
Total Protein, Urine: NEGATIVE
Urine Glucose: NEGATIVE

## 2012-06-09 LAB — LIPID PANEL
Cholesterol: 134 mg/dL (ref 0–200)
Triglycerides: 91 mg/dL (ref 0.0–149.0)

## 2012-07-01 ENCOUNTER — Ambulatory Visit (INDEPENDENT_AMBULATORY_CARE_PROVIDER_SITE_OTHER): Payer: BC Managed Care – PPO | Admitting: Internal Medicine

## 2012-07-01 ENCOUNTER — Encounter: Payer: Self-pay | Admitting: Internal Medicine

## 2012-07-01 VITALS — BP 112/78 | HR 77 | Temp 97.4°F | Ht 71.0 in | Wt 215.4 lb

## 2012-07-01 DIAGNOSIS — Z0000 Encounter for general adult medical examination without abnormal findings: Secondary | ICD-10-CM

## 2012-07-01 DIAGNOSIS — I10 Essential (primary) hypertension: Secondary | ICD-10-CM

## 2012-07-01 MED ORDER — FEBUXOSTAT 80 MG PO TABS: 1.0000 | ORAL_TABLET | Freq: Every day | ORAL | Status: DC

## 2012-07-01 MED ORDER — BENAZEPRIL HCL 10 MG PO TABS: 10.0000 mg | ORAL_TABLET | Freq: Every day | ORAL | Status: DC

## 2012-07-01 MED ORDER — FUROSEMIDE 20 MG PO TABS: 20.0000 mg | ORAL_TABLET | Freq: Every day | ORAL | Status: DC

## 2012-07-01 MED ORDER — CARVEDILOL 12.5 MG PO TABS: 12.5000 mg | ORAL_TABLET | Freq: Two times a day (BID) | ORAL | Status: DC

## 2012-07-01 NOTE — Patient Instructions (Addendum)
Continue all other medications as before You are given the lab results today, and the refills as you requested Please have the pharmacy call with any other refills you may need. Please continue your efforts at being more active, low cholesterol diet, and weight control. You are otherwise up to date with prevention Thank you for enrolling in MyChart. Please follow the instructions below to securely access your online medical record. MyChart allows you to send messages to your doctor, view your test results, renew your prescriptions, schedule appointments, and more. To Log into MyChart, please go to [email or web address], and your Username is: [phone] (pass fudge) Please return in 1 year for your yearly visit, or sooner if needed, with Lab testing done 3-5 days before

## 2012-07-02 ENCOUNTER — Encounter: Payer: Self-pay | Admitting: Internal Medicine

## 2012-07-02 ENCOUNTER — Other Ambulatory Visit: Payer: Self-pay | Admitting: Internal Medicine

## 2012-07-02 MED ORDER — COLCHICINE 0.6 MG PO TABS: 0.6000 mg | ORAL_TABLET | Freq: Every day | ORAL | Status: DC

## 2012-07-02 NOTE — Assessment & Plan Note (Signed)

## 2012-07-02 NOTE — Telephone Encounter (Signed)
Med change per pt reqeust

## 2012-07-02 NOTE — Assessment & Plan Note (Signed)
stable overall by history and exam, recent data reviewed with pt, and pt to continue medical treatment as before,  to f/u any worsening symptoms or concerns BP Readings from Last 3 Encounters:  07/01/12 112/78  05/06/12 139/98  01/01/12 120/90

## 2012-07-02 NOTE — Progress Notes (Signed)
Subjective:    Patient ID: Matthew Guerrero, male    DOB: 1956/04/24, 57 y.o.   MRN: [id number]  HPI  Here for wellness and f/u;  Overall doing ok;  Pt denies CP, worsening SOB, DOE, wheezing, orthopnea, PND, worsening LE edema, palpitations, dizziness or syncope.  Pt denies neurological change such as new headache, facial or extremity weakness.  Pt denies polydipsia, polyuria, or low sugar symptoms. Pt states overall good compliance with treatment and medications, good tolerability, and has been trying to follow lower cholesterol diet.  Pt denies worsening depressive symptoms, suicidal ideation or panic. No fever, night sweats, wt loss, loss of appetite, or other constitutional symptoms.  Pt states good ability with ADL's, has low fall risk, home safety reviewed and adequate, no other significant changes in hearing or vision, and only occasionally active with exercise.  No acute complaints. Needs med refills.   Past Medical History  Diagnosis Date  . COLONIC POLYPS, HX OF 07/01/2009    Qualifier: Diagnosis of  By: Jonny Ruiz MD, Len Blalock   . ALLERGIC RHINITIS 07/01/2009    Qualifier: Diagnosis of  By: Jonny Ruiz MD, Len Blalock   . GOUT 07/01/2009    Qualifier: Diagnosis of  By: Jonny Ruiz MD, Len Blalock   . HYPERLIPIDEMIA 07/03/2010    Qualifier: Diagnosis of  By: Jonny Ruiz MD, Len Blalock   . HYPERTENSION 07/01/2009    Qualifier: Diagnosis of  By: Jonny Ruiz MD, Len Blalock   . Asthma     right elbow tendonitis   Past Surgical History  Procedure Date  . Colonoscopy     reports that he has never smoked. He has never used smokeless tobacco. He reports that he does not drink alcohol or use illicit drugs. family history includes Cancer in his mother; Diabetes in his father; and Heart disease in his father.  There is no history of Colon cancer, and Esophageal cancer, and Rectal cancer, and Stomach cancer, . No Known Allergies Current Outpatient Prescriptions on File Prior to Visit  Medication Sig Dispense Refill  . aspirin 81 MG EC tablet  Take 1 tablet (81 mg total) by mouth daily. Swallow whole.  30 tablet  12  . benazepril (LOTENSIN) 10 MG tablet Take 1 tablet (10 mg total) by mouth daily.  90 tablet  3  . carvedilol (COREG) 12.5 MG tablet Take 1 tablet (12.5 mg total) by mouth 2 (two) times daily.  180 tablet  3  . furosemide (LASIX) 20 MG tablet Take 1 tablet (20 mg total) by mouth daily.  90 tablet  3  . Saw Palmetto 450 MG CAPS Take by mouth daily.      . traMADol (ULTRAM) 50 MG tablet as needed.      . colchicine 0.6 MG tablet Take 1 tablet (0.6 mg total) by mouth daily.  90 tablet  3   Review of Systems Constitutional: Negative for diaphoresis, activity change, appetite change or unexpected weight change.  HENT: Negative for hearing loss, ear pain, facial swelling, mouth sores and neck stiffness.   Eyes: Negative for pain, redness and visual disturbance.  Respiratory: Negative for shortness of breath and wheezing.   Cardiovascular: Negative for chest pain and palpitations.  Gastrointestinal: Negative for diarrhea, blood in stool, abdominal distention or other pain Genitourinary: Negative for hematuria, flank pain or change in urine volume.  Musculoskeletal: Negative for myalgias and joint swelling.  Skin: Negative for color change and wound.  Neurological: Negative for syncope and numbness. other than noted Hematological: Negative for adenopathy.  Psychiatric/Behavioral: Negative for hallucinations, self-injury, decreased concentration and agitation.      Objective:   Physical Exam BP 112/78  Pulse 77  Temp 97.4 F (36.3 C) (Oral)  Ht 5\' 11"  (1.803 m)  Wt 215 lb 6 oz (97.693 kg)  BMI 30.04 kg/m2  SpO2 97% Physical Exam  VS noted,  Constitutional: Pt is oriented to person, place, and time. Appears well-developed and well-nourished.  Head: Normocephalic and atraumatic.  Right Ear: External ear normal.  Left Ear: External ear normal.  Nose: Nose normal.  Mouth/Throat: Oropharynx is clear and moist.  Eyes:  Conjunctivae and EOM are normal. Pupils are equal, round, and reactive to light.  Neck: Normal range of motion. Neck supple. No JVD present. No tracheal deviation present.  Cardiovascular: Normal rate, regular rhythm, normal heart sounds and intact distal pulses.   Pulmonary/Chest: Effort normal and breath sounds normal.  Abdominal: Soft. Bowel sounds are normal. There is no tenderness. No HSM  Musculoskeletal: Normal range of motion. Exhibits no edema.  Lymphadenopathy:  Has no cervical adenopathy.  Neurological: Pt is alert and oriented to person, place, and time. Pt has normal reflexes. No cranial nerve deficit.  Skin: Skin is warm and dry. No rash noted.  Psychiatric:  Has  normal mood and affect. Behavior is normal.     Assessment & Plan:

## 2012-10-21 ENCOUNTER — Ambulatory Visit (INDEPENDENT_AMBULATORY_CARE_PROVIDER_SITE_OTHER): Payer: BC Managed Care – PPO | Admitting: Internal Medicine

## 2012-10-21 ENCOUNTER — Encounter: Payer: Self-pay | Admitting: *Deleted

## 2012-10-21 ENCOUNTER — Encounter: Payer: Self-pay | Admitting: Internal Medicine

## 2012-10-21 VITALS — BP 160/92 | HR 70 | Temp 97.9°F | Wt 215.8 lb

## 2012-10-21 DIAGNOSIS — I10 Essential (primary) hypertension: Secondary | ICD-10-CM

## 2012-10-21 MED ORDER — BENAZEPRIL HCL 10 MG PO TABS: 10.0000 mg | ORAL_TABLET | Freq: Two times a day (BID) | ORAL | Status: DC

## 2012-10-21 MED ORDER — TRAMADOL HCL 50 MG PO TABS: 50.0000 mg | ORAL_TABLET | Freq: Three times a day (TID) | ORAL | Status: DC | PRN

## 2012-10-21 NOTE — Progress Notes (Signed)
  Subjective:    Patient ID: Matthew Guerrero, male    DOB: 01-05-1956, 57 y.o.   MRN: [id number]  HPI  Here for elevation of blood pressure  See PA student note  Past Medical History  Diagnosis Date  . ALLERGIC RHINITIS   . GOUT   . HYPERLIPIDEMIA   . HYPERTENSION   . Asthma   . Cervical radiculitis   . Lateral epicondylitis of right elbow      Review of Systems  Respiratory: Negative for cough and shortness of breath.   Cardiovascular: Negative for chest pain, palpitations and leg swelling.  Allergic/Immunologic: Negative for environmental allergies.  Neurological: Negative for numbness and headaches.       Objective:   Physical Exam BP 160/92  Pulse 70  Temp(Src) 97.9 F (36.6 C) (Oral)  Wt 215 lb 12.8 oz (97.886 kg)  BMI 30.11 kg/m2  SpO2 97% Wt Readings from Last 3 Encounters:  10/21/12 215 lb 12.8 oz (97.886 kg)  07/01/12 215 lb 6 oz (97.693 kg)  05/06/12 216 lb (97.977 kg)   Constitutional: he appears well-developed and well-nourished. No distress.   Neck: Normal range of motion. Neck supple. No JVD present. No thyromegaly present.  Cardiovascular: Normal rate, regular rhythm and normal heart sounds.  No murmur heard. No BLE edema. Pulmonary/Chest: Effort normal and breath sounds normal. No respiratory distress. he has no wheezes.  Neurological: he is alert and oriented to person, place, and time. No cranial nerve deficit. Coordination, balance, strength, speech and gait are normal.  Skin: Skin is warm and dry. No rash noted. No erythema.  Psychiatric: he has a normal mood and affect. behavior is normal. Judgment and thought content normal.  Lab Results  Component Value Date   WBC 6.8 06/09/2012   HGB 15.5 06/09/2012   HCT 46.8 06/09/2012   PLT 152.0 06/09/2012   GLUCOSE 106* 06/09/2012   CHOL 134 06/09/2012   TRIG 91.0 06/09/2012   HDL 30.70* 06/09/2012   LDLDIRECT 105.8 06/18/2010   LDLCALC 85 06/09/2012   ALT 38 06/09/2012   AST 35 06/09/2012   NA 139 06/09/2012   K 4.7 06/09/2012   CL 104 06/09/2012   CREATININE 1.1 06/09/2012   BUN 15 06/09/2012   CO2 27 06/09/2012   TSH 1.67 06/09/2012   PSA 0.42 06/09/2012   HGBA1C 6.3 06/18/2010       Assessment & Plan:   See problem list. Medications and labs reviewed today.

## 2012-10-21 NOTE — Assessment & Plan Note (Addendum)
BP Readings from Last 3 Encounters:  10/21/12 160/92  07/01/12 112/78  05/06/12 139/98   Recent increase in blood pressure -  no apparent triggers -denies anti-inflammatory use, decongestant use, or other medication changes Reports compliance with medications as prescribed Recent December 2013 labs reviewed and within normal limits, reports normal ER eval for same yesterday exam benign, asymptomatic today Increase ACE inhibitor dose to 20 mg lisinopril daily, continue same Coreg Followup with primary care physician in 1-2 weeks to review blood pressure control and medications, call sooner if worse Work not for today provided

## 2012-10-21 NOTE — Progress Notes (Signed)
Subjective:    Patient ID: Matthew Guerrero, male    DOB: 11/11/1955, 57 y.o.   MRN: [id number]  HPI  Patient is here today for f/u for HTN.  He was seen yesterday at [facility] ED for same.  BP was taken yesterday at work by a friend, it was 162/100 he was advised by a friend to go to the ED.  He denies having any symptoms of HA, numbness, weakness, blurred vision, chest pain, chest tightness.  He experienced slight lightheadedness and thinks that this may be due to the air conditioning being off at work.  He says that they did some lab work and everything was okay.  States that today he is feeling good.  He reports compliance with his medications daily.  He is not experiencing any adverse effects.  Past Medical History  Diagnosis Date  . COLONIC POLYPS, HX OF 07/01/2009    Qualifier: Diagnosis of  By: Jonny Ruiz MD, Len Blalock   . ALLERGIC RHINITIS 07/01/2009    Qualifier: Diagnosis of  By: Jonny Ruiz MD, Len Blalock   . GOUT 07/01/2009    Qualifier: Diagnosis of  By: Jonny Ruiz MD, Len Blalock   . HYPERLIPIDEMIA 07/03/2010    Qualifier: Diagnosis of  By: Jonny Ruiz MD, Len Blalock   . HYPERTENSION 07/01/2009    Qualifier: Diagnosis of  By: Jonny Ruiz MD, Len Blalock   . Asthma     right elbow tendonitis  . Cervical radiculitis 01/02/2012  . Lateral epicondylitis of right elbow 01/02/2012   Family History  Problem Relation Age of Onset  . Cancer Mother   . Diabetes Father   . Heart disease Father   . Colon cancer Neg Hx   . Esophageal cancer Neg Hx   . Rectal cancer Neg Hx   . Stomach cancer Neg Hx         Review of Systems  Constitutional: Negative for fever, chills and fatigue.  Eyes: Negative for visual disturbance.  Respiratory: Negative for chest tightness and shortness of breath.   Cardiovascular: Negative for chest pain and leg swelling.  Neurological: Positive for light-headedness. Negative for dizziness, syncope, weakness, numbness and headaches.  All other systems reviewed and are negative.        Objective:   Physical Exam  Nursing note and vitals reviewed. Constitutional: He is oriented to person, place, and time. He appears well-developed and well-nourished.  HENT:  Head: Normocephalic and atraumatic.  Eyes: Conjunctivae are normal. No scleral icterus.  Neck: Normal range of motion. Neck supple. Carotid bruit is not present.  Cardiovascular: Normal rate, regular rhythm and normal heart sounds.  Exam reveals no gallop and no friction rub.   No murmur heard. No pretibial edema noted  Pulmonary/Chest: Effort normal and breath sounds normal. No respiratory distress. He has no wheezes. He has no rales. He exhibits no tenderness.  Musculoskeletal: Normal range of motion.  Neurological: He is alert and oriented to person, place, and time.  Skin: Skin is warm and dry.  Psychiatric: He has a normal mood and affect. His behavior is normal.   BP Readings from Last 3 Encounters:  10/21/12 160/92  07/01/12 112/78  05/06/12 139/98   Wt Readings from Last 3 Encounters:  10/21/12 215 lb 12.8 oz (97.886 kg)  07/01/12 215 lb 6 oz (97.693 kg)  05/06/12 216 lb (97.977 kg)          Assessment & Plan:  Patient here for f/u of HTN after going to [facility] ED.  Patient instructed to increase dose of benzapril from 10 mg PO QD to 10 mg PO BID.  Patient to monitor BP at home with his own BP cuff.    Pt. To return to see PCP in 2 weeks.  Patient was instructed on return precautions and is to call if BP remains over 140 or if he experiences symptoms.  Refill of tramadol prescribed as requested.  Work note provided.     I have personally reviewed this case with PA student. I also personally examined this patient. I agree with history and findings as documented above. I reviewed, discussed and approve of the assessment and plan as listed above. Rene Paci, MD

## 2012-10-21 NOTE — Patient Instructions (Signed)
It was good to see you today. We have reviewed your prior records including labs and tests today Increase lisinopril to 10 mg tablet twice daily, continue Coreg 12.5 mg tablet twice daily Refill on tramadol provided as requested Your prescription(s) have been submitted to your pharmacy. Please take as directed and contact our office if you believe you are having problem(s) with the medication(s). Work note for today provided as requested Keep a blood pressure log record at home: record your blood pressure morning each morning and as needed - call if systolic pressure remains greater than 140 or if any problems/symptoms followup with Dr. Jonny Ruiz in 1-2 weeks to review medications and recheck blood pressure, call sooner if problems  How to Take Your Blood Pressure  These instructions are only for electronic home blood pressure machines. You will need:   An automatic or semi-automatic blood pressure machine.  Fresh batteries for the blood pressure machine. HOW DO I USE THESE TOOLS TO CHECK MY BLOOD PRESSURE?   There are 2 numbers that make up your blood pressure. For example: 120/80.  The first number (120 in our example) is called the "systolic pressure." It is a measure of the pressure in your blood vessels when your heart is pumping blood.  The second number (80 in our example) is called the "diastolic pressure." It is a measure of the pressure in your blood vessels when your heart is resting between beats.  Before you buy a home blood pressure machine, check the size of your arm so you can buy the right size cuff. Here is how to check the size of your arm:  Use a tape measure that shows both inches and centimeters.  Wrap the tape measure around the middle upper part of your arm. You may need someone to help you measure right.  Write down your arm measurement in both inches and centimeters.  To measure your blood pressure right, it is important to have the right size cuff.  If your arm  is up to 13 inches (37 to 34 centimeters), get an adult cuff size.  If your arm is 13 to 17 inches (35 to 44 centimeters), get a large adult cuff size.  If your arm is 17 to 20 inches (45 to 52 centimeters), get an adult thigh cuff.  Try to rest or relax for at least 30 minutes before you check your blood pressure.  Do not smoke.  Do not have any drinks with caffeine, such as:  Pop.  Coffee.  Tea.  Check your blood pressure in a quiet room.  Sit down and stretch out your arm on a table. Keep your arm at about the level of your heart. Let your arm relax. GETTING BLOOD PRESSURE READINGS  Make sure you remove any tight-fighting clothing from your arm. Wrap the cuff around your upper arm. Wrap it just above the bend, and above where you felt the pulse. You should be able to slip a finger between the cuff and your arm. If you cannot slip a finger in the cuff, it is too tight and should be removed and rewrapped.  Some units requires you to manually pump up the arm cuff.  Automatic units inflate the cuff when you press a button.  Cuff deflation is automatic in both models.  After the cuff is inflated, the unit measures your blood pressure and pulse. The readings are displayed on a monitor. Hold still and breathe normally while the cuff is inflated.  Getting a reading  takes less than a minute.  Some models store readings in a memory. Some provide a printout of readings.  Get readings at different times of the day. You should wait at least 5 minutes between readings. Take readings with you to your next doctor's visit. Document Released: 05/21/2008 Document Revised: 08/31/2011 Document Reviewed: 05/21/2008 [facility] Patient Information 2013 [place], [place]. Hypertension As your heart beats, it forces blood through your arteries. This force is your blood pressure. If the pressure is too high, it is called hypertension (HTN) or high blood pressure. HTN is dangerous because you may have it  and not know it. High blood pressure may mean that your heart has to work harder to pump blood. Your arteries may be narrow or stiff. The extra work puts you at risk for heart disease, stroke, and other problems.  Blood pressure consists of two numbers, a higher number over a lower, 110/72, for example. It is stated as "110 over 72." The ideal is below 120 for the top number (systolic) and under 80 for the bottom (diastolic). Write down your blood pressure today. You should pay close attention to your blood pressure if you have certain conditions such as:  Heart failure.  Prior heart attack.  Diabetes  Chronic kidney disease.  Prior stroke.  Multiple risk factors for heart disease. To see if you have HTN, your blood pressure should be measured while you are seated with your arm held at the level of the heart. It should be measured at least twice. A one-time elevated blood pressure reading (especially in the Emergency Department) does not mean that you need treatment. There may be conditions in which the blood pressure is different between your right and left arms. It is important to see your caregiver soon for a recheck. Most people have essential hypertension which means that there is not a specific cause. This type of high blood pressure may be lowered by changing lifestyle factors such as:  Stress.  Smoking.  Lack of exercise.  Excessive weight.  Drug/tobacco/alcohol use.  Eating less salt. Most people do not have symptoms from high blood pressure until it has caused damage to the body. Effective treatment can often prevent, delay or reduce that damage. TREATMENT  When a cause has been identified, treatment for high blood pressure is directed at the cause. There are a large number of medications to treat HTN. These fall into several categories, and your caregiver will help you select the medicines that are best for you. Medications may have side effects. You should review side  effects with your caregiver. If your blood pressure stays high after you have made lifestyle changes or started on medicines,   Your medication(s) may need to be changed.  Other problems may need to be addressed.  Be certain you understand your prescriptions, and know how and when to take your medicine.  Be sure to follow up with your caregiver within the time frame advised (usually within two weeks) to have your blood pressure rechecked and to review your medications.  If you are taking more than one medicine to lower your blood pressure, make sure you know how and at what times they should be taken. Taking two medicines at the same time can result in blood pressure that is too low. SEEK IMMEDIATE MEDICAL CARE IF:  You develop a severe headache, blurred or changing vision, or confusion.  You have unusual weakness or numbness, or a faint feeling.  You have severe chest or abdominal pain, vomiting,  or breathing problems. MAKE SURE YOU:   Understand these instructions.  Will watch your condition.  Will get help right away if you are not doing well or get worse. Document Released: 06/08/2005 Document Revised: 08/31/2011 Document Reviewed: 01/27/2008 [facility] Patient Information 2013 [place], [place].

## 2012-11-04 ENCOUNTER — Ambulatory Visit (INDEPENDENT_AMBULATORY_CARE_PROVIDER_SITE_OTHER): Payer: BC Managed Care – PPO | Admitting: Internal Medicine

## 2012-11-04 ENCOUNTER — Encounter: Payer: Self-pay | Admitting: Internal Medicine

## 2012-11-04 VITALS — BP 136/90 | HR 75 | Temp 97.8°F | Wt 215.0 lb

## 2012-11-04 DIAGNOSIS — E785 Hyperlipidemia, unspecified: Secondary | ICD-10-CM

## 2012-11-04 DIAGNOSIS — I10 Essential (primary) hypertension: Secondary | ICD-10-CM

## 2012-11-04 DIAGNOSIS — J309 Allergic rhinitis, unspecified: Secondary | ICD-10-CM

## 2012-11-04 MED ORDER — BENAZEPRIL HCL 40 MG PO TABS: 40.0000 mg | ORAL_TABLET | Freq: Every day | ORAL | Status: DC

## 2012-11-04 NOTE — Patient Instructions (Addendum)
Ok to change the benazepril to 40 mg per day Please continue all other medications as before, and refills have been done if requested. Please continue your efforts at being more active, low cholesterol diet, and weight control. Please return in Jan 2015, or sooner if needed, with Lab testing done 3-5 days before

## 2012-11-04 NOTE — Progress Notes (Signed)
Subjective:    Patient ID: Matthew Guerrero, male    DOB: 30-Dec-1955, 57 y.o.   MRN: [id number]  HPI Here to f/u; overall doing ok,  Pt denies chest pain, increased sob or doe, wheezing, orthopnea, PND, increased LE swelling, palpitations, dizziness or syncope.  Pt denies polydipsia, polyuria, or low sugar symptoms such as weakness or confusion improved with po intake.  Pt denies new neurological symptoms such as new headache, or facial or extremity weakness or numbness.   Pt states overall good compliance with meds, has been trying to follow lower cholesterol diet, with wt overall stable,  but little exercise however. BP at home has been midl elevated. Does have several wks ongoing nasal allergy symptoms with clearish congestion, itch and sneezing, without fever, pain, ST, cough, swelling or wheezing.  Past Medical History  Diagnosis Date  . ALLERGIC RHINITIS   . GOUT   . HYPERLIPIDEMIA   . HYPERTENSION   . Asthma   . Cervical radiculitis   . Lateral epicondylitis of right elbow    Past Surgical History  Procedure Laterality Date  . Colonoscopy      reports that he has never smoked. He has never used smokeless tobacco. He reports that he does not drink alcohol or use illicit drugs. family history includes Cancer in his mother; Diabetes in his father; and Heart disease in his father.  There is no history of Colon cancer, and Esophageal cancer, and Rectal cancer, and Stomach cancer, . No Known Allergies Current Outpatient Prescriptions on File Prior to Visit  Medication Sig Dispense Refill  . carvedilol (COREG) 12.5 MG tablet Take 1 tablet (12.5 mg total) by mouth 2 (two) times daily.  180 tablet  3  . colchicine 0.6 MG tablet Take 1 tablet (0.6 mg total) by mouth daily.  90 tablet  3  . furosemide (LASIX) 20 MG tablet Take 1 tablet (20 mg total) by mouth daily.  90 tablet  3  . Saw Palmetto 450 MG CAPS Take by mouth daily.      . traMADol (ULTRAM) 50 MG tablet Take 1 tablet (50 mg total)  by mouth every 8 (eight) hours as needed for pain.  30 tablet  1   No current facility-administered medications on file prior to visit.   Review of Systems  Constitutional: Negative for unexpected weight change, or unusual diaphoresis  HENT: Negative for tinnitus.   Eyes: Negative for photophobia and visual disturbance.  Respiratory: Negative for choking and stridor.   Gastrointestinal: Negative for vomiting and blood in stool.  Genitourinary: Negative for hematuria and decreased urine volume.  Musculoskeletal: Negative for acute joint swelling Skin: Negative for color change and wound.  Neurological: Negative for tremors and numbness other than noted  Psychiatric/Behavioral: Negative for decreased concentration or  hyperactivity.       Objective:   Physical Exam BP 136/90  Pulse 75  Temp(Src) 97.8 F (36.6 C) (Oral)  Wt 215 lb (97.523 kg)  BMI 30 kg/m2  SpO2 96% VS noted, not ill appearing Constitutional: Pt appears well-developed and well-nourished.  HENT: Head: NCAT.  Right Ear: External ear normal.  Left Ear: External ear normal.  Bilat tm's with mild erythema.  Max sinus areas mild tender.  Pharynx with mild erythema, no exudate Eyes: Conjunctivae and EOM are normal. Pupils are equal, round, and reactive to light.  Neck: Normal range of motion. Neck supple.  Cardiovascular: Normal rate and regular rhythm.   Pulmonary/Chest: Effort normal and breath sounds normal. , no  rales or wheezing Neurological: Pt is alert. Not confused  Skin: Skin is warm. No erythema.  Psychiatric: Pt behavior is normal. Thought content normal.     Assessment & Plan:

## 2012-11-05 NOTE — Assessment & Plan Note (Addendum)
Mild elevated overall, but stable overall by history and exam, recent data reviewed with pt, and pt to increase the ACE to 40 mg,  to f/u any worsening symptoms or concerns BP Readings from Last 3 Encounters:  11/04/12 136/90  10/21/12 160/92  07/01/12 112/78

## 2012-11-05 NOTE — Assessment & Plan Note (Signed)
Mild to mod, for OTC allegra prn,  to f/u any worsening symptoms or concerns 

## 2012-11-05 NOTE — Assessment & Plan Note (Signed)
stable overall by history and exam, recent data reviewed with pt, and pt to continue medical treatment as before,  to f/u any worsening symptoms or concerns Lab Results  Component Value Date   LDLCALC 85 06/09/2012

## 2012-12-09 ENCOUNTER — Encounter (HOSPITAL_COMMUNITY): Payer: Self-pay | Admitting: *Deleted

## 2012-12-09 ENCOUNTER — Emergency Department (HOSPITAL_COMMUNITY)
Admission: EM | Admit: 2012-12-09 | Discharge: 2012-12-09 | Disposition: A | Discharge status: Home / Self Care | Payer: BC Managed Care – PPO | Attending: Emergency Medicine | Admitting: Emergency Medicine

## 2012-12-09 DIAGNOSIS — E785 Hyperlipidemia, unspecified: Secondary | ICD-10-CM | POA: Insufficient documentation

## 2012-12-09 DIAGNOSIS — I10 Essential (primary) hypertension: Secondary | ICD-10-CM | POA: Insufficient documentation

## 2012-12-09 DIAGNOSIS — K130 Diseases of lips: Secondary | ICD-10-CM

## 2012-12-09 DIAGNOSIS — Z8739 Personal history of other diseases of the musculoskeletal system and connective tissue: Secondary | ICD-10-CM | POA: Insufficient documentation

## 2012-12-09 DIAGNOSIS — J45909 Unspecified asthma, uncomplicated: Secondary | ICD-10-CM | POA: Insufficient documentation

## 2012-12-09 DIAGNOSIS — Z79899 Other long term (current) drug therapy: Secondary | ICD-10-CM | POA: Insufficient documentation

## 2012-12-09 DIAGNOSIS — M109 Gout, unspecified: Secondary | ICD-10-CM | POA: Insufficient documentation

## 2012-12-09 DIAGNOSIS — W57XXXA Bitten or stung by nonvenomous insect and other nonvenomous arthropods, initial encounter: Secondary | ICD-10-CM

## 2012-12-09 MED ORDER — SULFAMETHOXAZOLE-TRIMETHOPRIM 800-160 MG PO TABS: 1.0000 | ORAL_TABLET | Freq: Two times a day (BID) | ORAL | Status: DC

## 2012-12-09 MED ORDER — PREDNISONE 20 MG PO TABS
40.0000 mg | ORAL_TABLET | Freq: Once | ORAL | Status: AC
  Administered 2012-12-09: 40 mg via ORAL
  Filled 2012-12-09: qty 2

## 2012-12-09 MED ORDER — PREDNISONE 20 MG PO TABS: 40.0000 mg | ORAL_TABLET | Freq: Every day | ORAL | Status: DC

## 2012-12-09 NOTE — ED Notes (Addendum)
Patient states he noticed swelling to his upper lip on Tuesday and then later noticed a "knot" to the underside of his jaw. Patient was seen by a "tele-dr" and was prescribed Cefloxin (?). Patient states it isn't working, and he is here for further evaluation. PA, Elmarie Shiley has seen patient. Patient denies difficulty breathing, denies difficulty swallowing or any other symptoms.

## 2012-12-09 NOTE — ED Notes (Signed)
Pt states top lip has been swollen since Monday; work MD called in antibiotic and states is no better; states has knot under chin

## 2012-12-09 NOTE — ED Provider Notes (Signed)
History     CSN: [id number]  Arrival date & time 12/09/12  4098   First MD Initiated Contact with Patient 12/09/12 [phone]      Chief Complaint  Patient presents with  . Oral Swelling    (Consider location/radiation/quality/duration/timing/severity/associated sxs/prior treatment) HPI  Matthew Guerrero is a 57 y.o.male presenting to the ER with complaints of upper lip swelling for 1 week. He was bit by a bug on his lip and had a pimple there which he accidentally irritated when he shaved. Since then it has gotten red and swollen. He spoke with  TeleDoc who called him in Keflex which he has been taking for 3 days but the swelling and tenderness is getting worse. He denies the pain being severe, having any swelling to the inside of his mouth. No difficulty breathing, wheezing, SOB or weakness, fevers.    Past Medical History  Diagnosis Date  . ALLERGIC RHINITIS   . GOUT   . HYPERLIPIDEMIA   . HYPERTENSION   . Asthma   . Cervical radiculitis   . Lateral epicondylitis of right elbow     Past Surgical History  Procedure Laterality Date  . Colonoscopy      Family History  Problem Relation Age of Onset  . Cancer Mother   . Diabetes Father   . Heart disease Father   . Colon cancer Neg Hx   . Esophageal cancer Neg Hx   . Rectal cancer Neg Hx   . Stomach cancer Neg Hx     History  Substance Use Topics  . Smoking status: Never Smoker   . Smokeless tobacco: Never Used  . Alcohol Use: No      Review of Systems  Skin: Positive for wound.  All other systems reviewed and are negative.    Allergies  Review of patient's allergies indicates no known allergies.  Home Medications   Current Outpatient Rx  Name  Route  Sig  Dispense  Refill  . benazepril (LOTENSIN) 40 MG tablet   Oral   Take 40 mg by mouth daily.         . carvedilol (COREG) 12.5 MG tablet   Oral   Take 12.5 mg by mouth 2 (two) times daily.         . cephALEXin (KEFLEX) 500 MG capsule   Oral  Take 500 mg by mouth 3 (three) times daily.         . colchicine 0.6 MG tablet   Oral   Take 0.6 mg by mouth daily.         . furosemide (LASIX) 20 MG tablet   Oral   Take 20 mg by mouth daily.         . Saw Palmetto 450 MG CAPS   Oral   Take 450 mg by mouth daily.          . traMADol (ULTRAM) 50 MG tablet   Oral   Take 50 mg by mouth every 8 (eight) hours as needed for pain.         . predniSONE (DELTASONE) 20 MG tablet   Oral   Take 2 tablets (40 mg total) by mouth daily.   8 tablet   0   . sulfamethoxazole-trimethoprim (SEPTRA DS) 800-160 MG per tablet   Oral   Take 1 tablet by mouth every 12 (twelve) hours.   10 tablet   0     BP 142/92  Pulse 76  Temp(Src) 97.8 F (36.6 C) (Oral)  Resp 18  SpO2 98%  Physical Exam  Nursing note and vitals reviewed. Constitutional: He appears well-developed and well-nourished. No distress.  HENT:  Head: Normocephalic and atraumatic.  Mouth/Throat: Uvula is midline and oropharynx is clear and moist.    Eyes: Pupils are equal, round, and reactive to light.  Neck: Normal range of motion. Neck supple.  Cardiovascular: Normal rate and regular rhythm.   Pulmonary/Chest: Effort normal.  Abdominal: Soft.  Neurological: He is alert.  Skin: Skin is warm and dry.    ED Course  Procedures (including critical care time)  Labs Reviewed - No data to display No results found.   1. Insect bite   2. Cellulitis, lip       MDM   Rx: Bactrim and Prednisone  57 y.o.Matthew Guerrero's evaluation in the Emergency Department is complete. It has been determined that no acute conditions requiring further emergency intervention are present at this time. The patient/guardian have been advised of the diagnosis and plan. We have discussed signs and symptoms that warrant return to the ED, such as changes or worsening in symptoms.  Vital signs are stable at discharge. Filed Vitals:   12/09/12 0556  BP: 142/92  Pulse: 76    Temp: 97.8 F (36.6 C)  Resp: 18    Patient/guardian has voiced understanding and agreed to follow-up with the PCP or specialist.        Dorthula Matas, PA-C 12/09/12 [phone]

## 2012-12-09 NOTE — ED Provider Notes (Signed)
Medical screening examination/treatment/procedure(s) were performed by non-physician practitioner and as supervising physician I was immediately available for consultation/collaboration.  Kalasia Crafton M Jayel Inks, MD 12/09/12 0718 

## 2012-12-19 MED ORDER — TRAMADOL HCL 50 MG PO TABS: 50.0000 mg | ORAL_TABLET | Freq: Three times a day (TID) | ORAL | Status: DC | PRN

## 2012-12-19 NOTE — Telephone Encounter (Signed)
Done erx 

## 2013-04-27 ENCOUNTER — Other Ambulatory Visit: Payer: Self-pay

## 2013-05-31 ENCOUNTER — Other Ambulatory Visit: Payer: Self-pay | Admitting: Internal Medicine

## 2013-05-31 MED ORDER — COLCHICINE 0.6 MG PO TABS: 0.6000 mg | ORAL_TABLET | Freq: Every day | ORAL | Status: DC

## 2013-06-02 ENCOUNTER — Telehealth: Payer: Self-pay | Admitting: Internal Medicine

## 2013-06-02 NOTE — Telephone Encounter (Signed)
Received patients application for Patient assistance for colchicine, forms completed and faxed to [facility] [phone]. Cindee Lame will notify patient when the medication is recieved

## 2013-06-25 ENCOUNTER — Other Ambulatory Visit: Payer: Self-pay | Admitting: Internal Medicine

## 2013-06-27 MED ORDER — TRAMADOL HCL 50 MG PO TABS: 50.0000 mg | ORAL_TABLET | Freq: Three times a day (TID) | ORAL | Status: DC | PRN

## 2013-06-27 NOTE — Telephone Encounter (Signed)
Faxed script back to [facility]...Matthew Guerrero

## 2013-06-27 NOTE — Telephone Encounter (Signed)
MD out of office pls advise on refill.../lmb 

## 2013-06-30 ENCOUNTER — Telehealth: Payer: Self-pay | Admitting: *Deleted

## 2013-06-30 MED ORDER — CARVEDILOL 12.5 MG PO TABS: 12.5000 mg | ORAL_TABLET | Freq: Two times a day (BID) | ORAL | Status: DC

## 2013-06-30 MED ORDER — FUROSEMIDE 20 MG PO TABS: ORAL_TABLET | ORAL | Status: DC

## 2013-06-30 MED ORDER — BENAZEPRIL HCL 40 MG PO TABS: 40.0000 mg | ORAL_TABLET | Freq: Every day | ORAL | Status: DC

## 2013-06-30 MED ORDER — COLCHICINE 0.6 MG PO TABS: 0.6000 mg | ORAL_TABLET | Freq: Every day | ORAL | Status: DC

## 2013-06-30 NOTE — Telephone Encounter (Signed)
Done hardcopy to staff 

## 2013-06-30 NOTE — Telephone Encounter (Signed)
Reprinted furosemide first one was sent electronically to [facility] pt needing tp pick scripts up. Called pt notified rx's ready for pick-up...Matthew Guerrero

## 2013-06-30 NOTE — Telephone Encounter (Signed)
Pt phoned stating he needs ALL of his meds refilled for 90 days (those that can be).  States he will be by here this afternoon. Has not been seen by you since 11/04/2012.  Please advise.

## 2013-07-17 NOTE — Telephone Encounter (Signed)
Please check on pt's assistant.

## 2013-08-20 ENCOUNTER — Other Ambulatory Visit: Payer: Self-pay | Admitting: Internal Medicine

## 2013-08-28 ENCOUNTER — Other Ambulatory Visit: Payer: BC Managed Care – PPO

## 2013-08-30 ENCOUNTER — Encounter: Payer: Self-pay | Admitting: Internal Medicine

## 2013-08-30 ENCOUNTER — Ambulatory Visit (INDEPENDENT_AMBULATORY_CARE_PROVIDER_SITE_OTHER): Payer: BC Managed Care – PPO | Admitting: Internal Medicine

## 2013-08-30 VITALS — BP 122/90 | HR 89 | Temp 99.1°F | Wt 222.5 lb

## 2013-08-30 DIAGNOSIS — Z0000 Encounter for general adult medical examination without abnormal findings: Secondary | ICD-10-CM

## 2013-08-30 MED ORDER — BENAZEPRIL HCL 40 MG PO TABS: 40.0000 mg | ORAL_TABLET | Freq: Every day | ORAL | Status: DC

## 2013-08-30 MED ORDER — CARVEDILOL 12.5 MG PO TABS: 12.5000 mg | ORAL_TABLET | Freq: Two times a day (BID) | ORAL | Status: DC

## 2013-08-30 MED ORDER — FUROSEMIDE 20 MG PO TABS: ORAL_TABLET | ORAL | Status: DC

## 2013-08-30 NOTE — Progress Notes (Signed)
Subjective:    Patient ID: Matthew Guerrero, male    DOB: 07-Dec-1955, 58 y.o.   MRN: [id number]  HPI  Here for wellness and f/u;  Overall doing ok;  Pt denies CP, worsening SOB, DOE, wheezing, orthopnea, PND, worsening LE edema, palpitations, dizziness or syncope.  Pt denies neurological change such as new headache, facial or extremity weakness.  Pt denies polydipsia, polyuria, or low sugar symptoms. Pt states overall good compliance with treatment and medications, good tolerability, and has been trying to follow lower cholesterol diet.  Pt denies worsening depressive symptoms, suicidal ideation or panic. No fever, night sweats, wt loss, loss of appetite, or other constitutional symptoms.  Pt states good ability with ADL's, has low fall risk, home safety reviewed and adequate, no other significant changes in hearing or vision, and only occasionally active with exercise.  No acute complaints Past Medical History  Diagnosis Date  . ALLERGIC RHINITIS   . GOUT   . HYPERLIPIDEMIA   . HYPERTENSION   . Asthma   . Cervical radiculitis   . Lateral epicondylitis of right elbow    Past Surgical History  Procedure Laterality Date  . Colonoscopy      reports that he has never smoked. He has never used smokeless tobacco. He reports that he does not drink alcohol or use illicit drugs. family history includes Cancer in his mother; Diabetes in his father; Heart disease in his father. There is no history of Colon cancer, Esophageal cancer, Rectal cancer, or Stomach cancer. No Known Allergies Current Outpatient Prescriptions on File Prior to Visit  Medication Sig Dispense Refill  . colchicine 0.6 MG tablet Take 1 tablet (0.6 mg total) by mouth daily.  90 tablet  1  . Saw Palmetto 450 MG CAPS Take 450 mg by mouth daily.       . traMADol (ULTRAM) 50 MG tablet Take 1 tablet (50 mg total) by mouth every 8 (eight) hours as needed.  270 tablet  1   No current facility-administered medications on file prior to  visit.   Review of Systems Constitutional: Negative for diaphoresis, activity change, appetite change or unexpected weight change.  HENT: Negative for hearing loss, ear pain, facial swelling, mouth sores and neck stiffness.   Eyes: Negative for pain, redness and visual disturbance.  Respiratory: Negative for shortness of breath and wheezing.   Cardiovascular: Negative for chest pain and palpitations.  Gastrointestinal: Negative for diarrhea, blood in stool, abdominal distention or other pain Genitourinary: Negative for hematuria, flank pain or change in urine volume.  Musculoskeletal: Negative for myalgias and joint swelling.  Skin: Negative for color change and wound.  Neurological: Negative for syncope and numbness. other than noted Hematological: Negative for adenopathy.  Psychiatric/Behavioral: Negative for hallucinations, self-injury, decreased concentration and agitation.      Objective:   Physical Exam BP 122/90  Pulse 89  Temp(Src) 99.1 F (37.3 C) (Oral)  Wt 222 lb 8 oz (100.925 kg)  SpO2 96% VS noted,  Constitutional: Pt is oriented to person, place, and time. Appears well-developed and well-nourished.  Head: Normocephalic and atraumatic.  Right Ear: External ear normal.  Left Ear: External ear normal.  Nose: Nose normal.  Mouth/Throat: Oropharynx is clear and moist.  Eyes: Conjunctivae and EOM are normal. Pupils are equal, round, and reactive to light.  Neck: Normal range of motion. Neck supple. No JVD present. No tracheal deviation present.  Cardiovascular: Normal rate, regular rhythm, normal heart sounds and intact distal pulses.   Pulmonary/Chest: Effort  normal and breath sounds normal.  Abdominal: Soft. Bowel sounds are normal. There is no tenderness. No HSM  Musculoskeletal: Normal range of motion. Exhibits no edema.  Lymphadenopathy:  Has no cervical adenopathy.  Neurological: Pt is alert and oriented to person, place, and time. Pt has normal reflexes. No  cranial nerve deficit.  Skin: Skin is warm and dry. No rash noted.  Psychiatric:  Has  normal mood and affect. Behavior is normal.     Assessment & Plan:

## 2013-08-30 NOTE — Assessment & Plan Note (Signed)

## 2013-08-30 NOTE — Patient Instructions (Signed)

## 2013-08-30 NOTE — Progress Notes (Signed)
Pre visit review using our clinic review tool, if applicable. No additional management support is needed unless otherwise documented below in the visit note. 

## 2013-12-23 ENCOUNTER — Other Ambulatory Visit: Payer: Self-pay | Admitting: Internal Medicine

## 2013-12-25 ENCOUNTER — Other Ambulatory Visit: Payer: Self-pay

## 2013-12-26 MED ORDER — TRAMADOL HCL 50 MG PO TABS
50.0000 mg | ORAL_TABLET | Freq: Three times a day (TID) | ORAL | Status: DC | PRN
Start: ? — End: 2014-06-22

## 2013-12-26 NOTE — Telephone Encounter (Signed)
Faxed hardcopy to [employer]

## 2013-12-26 NOTE — Telephone Encounter (Signed)
Done hardcopy to robin  

## 2013-12-26 NOTE — Telephone Encounter (Signed)
Saw that the request has already been submitted.

## 2014-01-05 ENCOUNTER — Emergency Department (HOSPITAL_COMMUNITY): Payer: BC Managed Care – PPO

## 2014-01-05 ENCOUNTER — Encounter (HOSPITAL_COMMUNITY): Payer: Self-pay | Admitting: Emergency Medicine

## 2014-01-05 ENCOUNTER — Emergency Department (HOSPITAL_COMMUNITY)
Admission: EM | Admit: 2014-01-05 | Discharge: 2014-01-05 | Disposition: A | Discharge status: Home / Self Care | Payer: BC Managed Care – PPO | Attending: Emergency Medicine | Admitting: Emergency Medicine

## 2014-01-05 DIAGNOSIS — Z79899 Other long term (current) drug therapy: Secondary | ICD-10-CM | POA: Insufficient documentation

## 2014-01-05 DIAGNOSIS — I10 Essential (primary) hypertension: Secondary | ICD-10-CM | POA: Insufficient documentation

## 2014-01-05 DIAGNOSIS — J309 Allergic rhinitis, unspecified: Secondary | ICD-10-CM | POA: Insufficient documentation

## 2014-01-05 DIAGNOSIS — Y9389 Activity, other specified: Secondary | ICD-10-CM | POA: Insufficient documentation

## 2014-01-05 DIAGNOSIS — Y929 Unspecified place or not applicable: Secondary | ICD-10-CM | POA: Insufficient documentation

## 2014-01-05 DIAGNOSIS — J45909 Unspecified asthma, uncomplicated: Secondary | ICD-10-CM | POA: Insufficient documentation

## 2014-01-05 DIAGNOSIS — S7010XA Contusion of unspecified thigh, initial encounter: Secondary | ICD-10-CM | POA: Insufficient documentation

## 2014-01-05 DIAGNOSIS — S7012XA Contusion of left thigh, initial encounter: Secondary | ICD-10-CM

## 2014-01-05 DIAGNOSIS — E785 Hyperlipidemia, unspecified: Secondary | ICD-10-CM | POA: Insufficient documentation

## 2014-01-05 DIAGNOSIS — M109 Gout, unspecified: Secondary | ICD-10-CM | POA: Insufficient documentation

## 2014-01-05 DIAGNOSIS — M7710 Lateral epicondylitis, unspecified elbow: Secondary | ICD-10-CM | POA: Insufficient documentation

## 2014-01-05 DIAGNOSIS — M5412 Radiculopathy, cervical region: Secondary | ICD-10-CM | POA: Insufficient documentation

## 2014-01-05 DIAGNOSIS — R296 Repeated falls: Secondary | ICD-10-CM | POA: Insufficient documentation

## 2014-01-05 MED ORDER — TRAMADOL HCL 50 MG PO TABS: 50.0000 mg | ORAL_TABLET | Freq: Four times a day (QID) | ORAL | Status: DC | PRN

## 2014-01-05 NOTE — ED Notes (Signed)
Pt reports that a few weeks ago he was working on a flag pole, which fell while working with it and he fell with it during the fall. Pt reports falling from the standing position (on the ground), pt denies being hit by the pole, having a LOC, or hitting his head. Pt reports having intermittent left upper thigh pain for the past two weeks. Pt is A/O x4, in NAD, ambulatory to exam room without difficulty, and vitals are WDL.

## 2014-01-05 NOTE — ED Provider Notes (Signed)
CSN: [id number]     Arrival date & time 01/05/14  1107 History   This chart was scribed for Alvina Chou, PA, working with Dorie Rank, MD by Steva Colder, ED Scribe. The patient was seen in room WTR7/WTR7 at 11:56 AM.     Chief Complaint  Patient presents with  . Leg Pain     Patient is a 58 y.o. male presenting with leg pain. The history is provided by the patient. No language interpreter was used.  Leg Pain Location:  Leg Time since incident:  2 weeks Injury: no   Leg location:  L leg and L upper leg Pain details:    Radiates to:  Does not radiate   Severity:  Mild   Duration:  2 weeks   Timing:  Intermittent   Progression:  Worsening Chronicity:  New Prior injury to area:  No Relieved by:  Nothing Worsened by:  Bearing weight Associated symptoms: no decreased ROM, no numbness and no tingling    HPI Comments: Matthew Guerrero is a 58 y.o. male who presents to the Emergency Department complaining of left leg pain onset a few weeks ago. He states that he was working on a flag pole, that fell and he fell down with it. He states that he fell from a standing position. He states that he landed on his left side when he fell. He states that he can stand on the affected leg, it just hurts. He denies any bruising or injury to the affected area. He states that he is having intermittent left upper thigh pain for the past two weeks. He states that he has taken Tylenol with no relief for his symptoms. He states that the pain is getting worse. He denies LOC, tingling, numbness, hitting his head, being hit with the pole, hip pain, or any other associated symptoms.       Past Medical History  Diagnosis Date  . ALLERGIC RHINITIS   . GOUT   . HYPERLIPIDEMIA   . HYPERTENSION   . Asthma   . Cervical radiculitis   . Lateral epicondylitis of right elbow    Past Surgical History  Procedure Laterality Date  . Colonoscopy     Family History  Problem Relation Age of Onset  . Cancer  Mother   . Diabetes Father   . Heart disease Father   . Colon cancer Neg Hx   . Esophageal cancer Neg Hx   . Rectal cancer Neg Hx   . Stomach cancer Neg Hx    History  Substance Use Topics  . Smoking status: Never Smoker   . Smokeless tobacco: Never Used  . Alcohol Use: No    Review of Systems  Musculoskeletal: Negative for arthralgias.       No hip pain.  Skin: Negative for color change.  Neurological: Negative for syncope and numbness.       No tingling.  All other systems reviewed and are negative.     Allergies  Review of patient's allergies indicates no known allergies.  Home Medications   Prior to Admission medications   Medication Sig Start Date End Date Taking? Authorizing Provider  benazepril (LOTENSIN) 40 MG tablet Take 1 tablet (40 mg total) by mouth daily. 08/30/13   Biagio Borg, MD  carvedilol (COREG) 12.5 MG tablet Take 1 tablet (12.5 mg total) by mouth 2 (two) times daily. 08/30/13   Biagio Borg, MD  colchicine 0.6 MG tablet Take 1 tablet (0.6 mg total) by mouth daily.  06/30/13   Biagio Borg, MD  furosemide (LASIX) 20 MG tablet TAKE ONE TABLET BY MOUTH ONE TIME DAILY 08/30/13   Biagio Borg, MD  Saw Palmetto 450 MG CAPS Take 450 mg by mouth daily.     Historical Provider, MD  traMADol (ULTRAM) 50 MG tablet Take 1 tablet (50 mg total) by mouth every 8 (eight) hours as needed.    Biagio Borg, MD   BP 143/99  Pulse 80  Temp(Src) 98.6 F (37 C) (Oral)  Resp 18  SpO2 95%  Physical Exam  Nursing note and vitals reviewed. Constitutional: He is oriented to person, place, and time. He appears well-developed and well-nourished. No distress.  HENT:  Head: Normocephalic and atraumatic.  Eyes: EOM are normal.  Neck: Neck supple. No tracheal deviation present.  Cardiovascular: Normal rate.   Pulmonary/Chest: Effort normal. No respiratory distress.  Musculoskeletal: Normal range of motion.  Left lateral thigh tenderness to palpation. No left hip deformity Full  ROM of left hip. Pt is able to stand on both legs.   Neurological: He is alert and oriented to person, place, and time.  Skin: Skin is warm and dry.  No bruising or skin changes noted over the affected area.   Psychiatric: He has a normal mood and affect. His behavior is normal.    ED Course  Procedures (including critical care time) DIAGNOSTIC STUDIES: Oxygen Saturation is 95% on room air, adequate by my interpretation.    COORDINATION OF CARE: 11:59 AM-Discussed treatment plan which includes Hip X-Ray and Left Femur X-Ray with pt at bedside and pt agreed to plan.   Labs Review Labs Reviewed - No data to display  Imaging Review No results found.   EKG Interpretation None      MDM   Final diagnoses:  Thigh contusion, left, initial encounter    Patient's xray unremarkable for acute changes. Patient instructed to follow up with PCP. Patient will have Tramadol prescription.   I personally performed the services described in this documentation, which was scribed in my presence. The recorded information has been reviewed and is accurate.    Alvina Chou, PA-C 01/05/14 1429

## 2014-01-05 NOTE — Discharge Instructions (Signed)
Take Tramadol as needed for pain. Refer to attached documents for more information. Follow up with your doctor as needed.  °

## 2014-01-08 NOTE — ED Provider Notes (Signed)
Medical screening examination/treatment/procedure(s) were performed by non-physician practitioner and as supervising physician I was immediately available for consultation/collaboration.   Dorie Rank, MD 01/08/14 Lurline Hare

## 2014-02-25 ENCOUNTER — Ambulatory Visit (INDEPENDENT_AMBULATORY_CARE_PROVIDER_SITE_OTHER): Payer: BC Managed Care – PPO | Admitting: Physician Assistant

## 2014-02-25 VITALS — BP 128/84 | HR 84 | Temp 98.5°F | Resp 16 | Ht 69.5 in | Wt 215.6 lb

## 2014-02-25 DIAGNOSIS — Z23 Encounter for immunization: Secondary | ICD-10-CM

## 2014-02-25 NOTE — Progress Notes (Signed)
Flu vaccine only

## 2014-04-17 ENCOUNTER — Telehealth: Payer: Self-pay

## 2014-04-17 NOTE — Telephone Encounter (Signed)
To staff to help please

## 2014-04-17 NOTE — Telephone Encounter (Signed)
The patient needs PCP to print out paperwork from [facility] for patient assitance for Colchicine, he will complete and PCP provide prescription for and then fax in.  Advise please.

## 2014-04-18 MED ORDER — COLCHICINE 0.6 MG PO TABS: 0.6000 mg | ORAL_TABLET | Freq: Every day | ORAL | Status: DC

## 2014-04-18 NOTE — Telephone Encounter (Signed)
This patient needs only needs a refill for this medication faxed to [phone], printed and put prescription on MD's desk to sign.  Will fax once complete

## 2014-04-18 NOTE — Telephone Encounter (Signed)
Had to refax to another number at [phone] as previous number was a phone number.

## 2014-04-18 NOTE — Telephone Encounter (Signed)
Prescription faxed

## 2014-06-22 ENCOUNTER — Emergency Department (HOSPITAL_COMMUNITY)
Admission: EM | Admit: 2014-06-22 | Discharge: 2014-06-22 | Disposition: A | Discharge status: Home / Self Care | Payer: BC Managed Care – PPO | Attending: Emergency Medicine | Admitting: Emergency Medicine

## 2014-06-22 ENCOUNTER — Encounter (HOSPITAL_COMMUNITY): Payer: Self-pay | Admitting: Emergency Medicine

## 2014-06-22 DIAGNOSIS — M545 Low back pain, unspecified: Secondary | ICD-10-CM

## 2014-06-22 DIAGNOSIS — J45909 Unspecified asthma, uncomplicated: Secondary | ICD-10-CM | POA: Diagnosis not present

## 2014-06-22 DIAGNOSIS — Z79899 Other long term (current) drug therapy: Secondary | ICD-10-CM | POA: Diagnosis not present

## 2014-06-22 DIAGNOSIS — I10 Essential (primary) hypertension: Secondary | ICD-10-CM | POA: Diagnosis not present

## 2014-06-22 DIAGNOSIS — Z8639 Personal history of other endocrine, nutritional and metabolic disease: Secondary | ICD-10-CM | POA: Insufficient documentation

## 2014-06-22 DIAGNOSIS — R109 Unspecified abdominal pain: Secondary | ICD-10-CM | POA: Diagnosis present

## 2014-06-22 LAB — URINALYSIS, ROUTINE W REFLEX MICROSCOPIC
Bilirubin Urine: NEGATIVE
GLUCOSE, UA: NEGATIVE mg/dL
Hgb urine dipstick: NEGATIVE
KETONES UR: NEGATIVE mg/dL
LEUKOCYTES UA: NEGATIVE
Nitrite: NEGATIVE
PROTEIN: 30 mg/dL — AB
Specific Gravity, Urine: 1.019 (ref 1.005–1.030)
Urobilinogen, UA: 1 mg/dL (ref 0.0–1.0)
pH: 5.5 (ref 5.0–8.0)

## 2014-06-22 LAB — URINE MICROSCOPIC-ADD ON

## 2014-06-22 MED ORDER — CYCLOBENZAPRINE HCL 10 MG PO TABS: 10.0000 mg | ORAL_TABLET | Freq: Two times a day (BID) | ORAL | Status: DC | PRN

## 2014-06-22 MED ORDER — TRAMADOL HCL 50 MG PO TABS: 50.0000 mg | ORAL_TABLET | Freq: Three times a day (TID) | ORAL | Status: DC | PRN

## 2014-06-22 NOTE — ED Notes (Signed)
Pt c/o lt flank pain x 2 days.  Denies NVD.  Denies hx of nephrolithiasis.  Reports polyuria.

## 2014-06-22 NOTE — Discharge Instructions (Signed)
Back Pain, Adult Low back pain is very common. About 1 in 5 people have back pain.The cause of low back pain is rarely dangerous. The pain often gets better over time.About half of people with a sudden onset of back pain feel better in just 2 weeks. About 8 in 10 people feel better by 6 weeks.  CAUSES Some common causes of back pain include:  Strain of the muscles or ligaments supporting the spine.  Wear and tear (degeneration) of the spinal discs.  Arthritis.  Direct injury to the back. DIAGNOSIS Most of the time, the direct cause of low back pain is not known.However, back pain can be treated effectively even when the exact cause of the pain is unknown.Answering your caregiver's questions about your overall health and symptoms is one of the most accurate ways to make sure the cause of your pain is not dangerous. If your caregiver needs more information, he or she may order lab work or imaging tests (X-rays or MRIs).However, even if imaging tests show changes in your back, this usually does not require surgery. HOME CARE INSTRUCTIONS For many people, back pain returns.Since low back pain is rarely dangerous, it is often a condition that people can learn to manageon their own.   Remain active. It is stressful on the back to sit or stand in one place. Do not sit, drive, or stand in one place for more than 30 minutes at a time. Take short walks on level surfaces as soon as pain allows.Try to increase the length of time you walk each day.  Do not stay in bed.Resting more than 1 or 2 days can delay your recovery.  Do not avoid exercise or work.Your body is made to move.It is not dangerous to be active, even though your back may hurt.Your back will likely heal faster if you return to being active before your pain is gone.  Pay attention to your body when you bend and lift. Many people have less discomfortwhen lifting if they bend their knees, keep the load close to their bodies,and  avoid twisting. Often, the most comfortable positions are those that put less stress on your recovering back.  Find a comfortable position to sleep. Use a firm mattress and lie on your side with your knees slightly bent. If you lie on your back, put a pillow under your knees.  Only take over-the-counter or prescription medicines as directed by your caregiver. Over-the-counter medicines to reduce pain and inflammation are often the most helpful.Your caregiver may prescribe muscle relaxant drugs.These medicines help dull your pain so you can more quickly return to your normal activities and healthy exercise.  Put ice on the injured area.  Put ice in a plastic bag.  Place a towel between your skin and the bag.  Leave the ice on for 15-20 minutes, 03-04 times a day for the first 2 to 3 days. After that, ice and heat may be alternated to reduce pain and spasms.  Ask your caregiver about trying back exercises and gentle massage. This may be of some benefit.  Avoid feeling anxious or stressed.Stress increases muscle tension and can worsen back pain.It is important to recognize when you are anxious or stressed and learn ways to manage it.Exercise is a great option. SEEK MEDICAL CARE IF:  You have pain that is not relieved with rest or medicine.  You have pain that does not improve in 1 week.  You have new symptoms.  You are generally not feeling well. SEEK   IMMEDIATE MEDICAL CARE IF:   You have pain that radiates from your back into your legs.  You develop new bowel or bladder control problems.  You have unusual weakness or numbness in your arms or legs.  You develop nausea or vomiting.  You develop abdominal pain.  You feel faint. Document Released: 06/08/2005 Document Revised: 12/08/2011 Document Reviewed: 10/10/2013 ExitCare Patient Information 2015 [employer]. This information is not intended to replace advice given to you by your health care provider. Make sure you  discuss any questions you have with your health care provider.  

## 2014-06-22 NOTE — ED Provider Notes (Signed)
CSN: [id number]     Arrival date & time 06/22/14  7425 History   First MD Initiated Contact with Patient 06/22/14 [phone]     Chief Complaint  Patient presents with  . Flank Pain      HPI Patient has had 2 days of left lower back and left flank pain.  Pain worse when he walks and moves.  Better when he rests.  No vomiting or diarrhea.  No abdominal pain.  No dysuria. Past Medical History  Diagnosis Date  . ALLERGIC RHINITIS   . GOUT   . HYPERLIPIDEMIA   . HYPERTENSION   . Asthma   . Cervical radiculitis   . Lateral epicondylitis of right elbow    Past Surgical History  Procedure Laterality Date  . Colonoscopy     Family History  Problem Relation Age of Onset  . Cancer Mother   . Diabetes Father   . Heart disease Father   . Colon cancer Neg Hx   . Esophageal cancer Neg Hx   . Rectal cancer Neg Hx   . Stomach cancer Neg Hx    History  Substance Use Topics  . Smoking status: Never Smoker   . Smokeless tobacco: Never Used  . Alcohol Use: No    Review of Systems  All other systems reviewed and are negative  Allergies  Review of patient's allergies indicates no known allergies.  Home Medications   Prior to Admission medications   Medication Sig Start Date End Date Taking? Authorizing Provider  benazepril (LOTENSIN) 40 MG tablet Take 1 tablet (40 mg total) by mouth daily. 08/30/13   Biagio Borg, MD  carvedilol (COREG) 12.5 MG tablet Take 1 tablet (12.5 mg total) by mouth 2 (two) times daily. 08/30/13   Biagio Borg, MD  colchicine 0.6 MG tablet Take 1 tablet (0.6 mg total) by mouth daily. 04/18/14   Biagio Borg, MD  cyclobenzaprine (FLEXERIL) 10 MG tablet Take 1 tablet (10 mg total) by mouth 2 (two) times daily as needed for muscle spasms. 06/22/14   Dot Lanes, MD  furosemide (LASIX) 20 MG tablet TAKE ONE TABLET BY MOUTH ONE TIME DAILY 08/30/13   Biagio Borg, MD  Saw Palmetto 450 MG CAPS Take 450 mg by mouth daily.     Historical Provider, MD  traMADol (ULTRAM) 50  MG tablet Take 1 tablet (50 mg total) by mouth every 8 (eight) hours as needed. 06/22/14   Dot Lanes, MD   BP 122/90 mmHg  Pulse 100  Temp(Src) 98.4 F (36.9 C) (Oral)  Resp 18  SpO2 95% Physical Exam  Constitutional: He is oriented to person, place, and time. He appears well-developed and well-nourished. No distress.  HENT:  Head: Normocephalic and atraumatic.  Eyes: Pupils are equal, round, and reactive to light.  Neck: Normal range of motion.  Cardiovascular: Normal rate and intact distal pulses.   Pulmonary/Chest: No respiratory distress.  Abdominal: Normal appearance. He exhibits no distension.  Musculoskeletal: Normal range of motion. He exhibits tenderness.       Back:  Pain can be reproduced with palpation were indicated  Neurological: He is alert and oriented to person, place, and time. No cranial nerve deficit.  Skin: Skin is warm and dry. No rash noted.  Psychiatric: He has a normal mood and affect. His behavior is normal.  Nursing note and vitals reviewed.   ED Course  Procedures (including critical care time) Labs Review Labs Reviewed  URINALYSIS, [place]  MICROSCOPIC - Abnormal; Notable for the following:    Protein, ur 30 (*)    All other components within normal limits  URINE MICROSCOPIC-ADD ON    Imaging Review No results found.    MDM   Final diagnoses:  Left-sided low back pain without sciatica        Dot Lanes, MD 06/22/14 1010

## 2014-07-26 ENCOUNTER — Telehealth: Payer: Self-pay | Admitting: Internal Medicine

## 2014-07-26 NOTE — Telephone Encounter (Signed)
Patient returning your vm

## 2014-07-26 NOTE — Telephone Encounter (Signed)
Called left a detailed message to call back with details of request.

## 2014-07-26 NOTE — Telephone Encounter (Signed)
Pt request a call concern about paper work that we have to fill out so he can get his med.

## 2014-07-26 NOTE — Telephone Encounter (Signed)
Called left a detailed message to please call back with detailed message of request.

## 2014-07-27 NOTE — Telephone Encounter (Signed)
Patient left paperwork for colchicine will forward to [facility] to complete.

## 2014-08-10 ENCOUNTER — Telehealth: Payer: Self-pay

## 2014-08-10 NOTE — Telephone Encounter (Signed)
Faxed to [employer] pt pharmaceutical assistance application.

## 2014-08-14 DIAGNOSIS — Z0279 Encounter for issue of other medical certificate: Secondary | ICD-10-CM

## 2014-08-24 ENCOUNTER — Other Ambulatory Visit (INDEPENDENT_AMBULATORY_CARE_PROVIDER_SITE_OTHER): Payer: Self-pay

## 2014-08-24 DIAGNOSIS — E785 Hyperlipidemia, unspecified: Secondary | ICD-10-CM

## 2014-08-24 DIAGNOSIS — Z0000 Encounter for general adult medical examination without abnormal findings: Secondary | ICD-10-CM

## 2014-08-24 LAB — BASIC METABOLIC PANEL
BUN: 13 mg/dL (ref 6–23)
CALCIUM: 8.8 mg/dL (ref 8.4–10.5)
CO2: 25 mEq/L (ref 19–32)
Chloride: 109 mEq/L (ref 96–112)
Creatinine, Ser: 1.19 mg/dL (ref 0.40–1.50)
GFR: 80.2 mL/min (ref >60.00)
Glucose, Bld: 115 mg/dL — ABNORMAL HIGH (ref 70–99)
Potassium: 4.3 mEq/L (ref 3.5–5.1)
SODIUM: 140 meq/L (ref 135–145)

## 2014-08-24 LAB — CBC WITH DIFFERENTIAL/PLATELET
BASOS PCT: 0.6 % (ref 0.0–3.0)
Basophils Absolute: 0 10*3/uL (ref 0.0–0.1)
EOS PCT: 1.6 % (ref 0.0–5.0)
Eosinophils Absolute: 0.1 10*3/uL (ref 0.0–0.7)
HEMATOCRIT: 50.5 % (ref 39.0–52.0)
Hemoglobin: 16.5 g/dL (ref 13.0–17.0)
Lymphocytes Relative: 37.8 % (ref 12.0–46.0)
Lymphs Abs: 2.6 10*3/uL (ref 0.7–4.0)
MCHC: 32.7 g/dL (ref 30.0–36.0)
MCV: 85.3 fl (ref 78.0–100.0)
MONO ABS: 0.4 10*3/uL (ref 0.1–1.0)
MONOS PCT: 6 % (ref 3.0–12.0)
NEUTROS ABS: 3.7 10*3/uL (ref 1.4–7.7)
Neutrophils Relative %: 54 % (ref 43.0–77.0)
Platelets: 131 10*3/uL — ABNORMAL LOW (ref 150.0–400.0)
RBC: 5.92 Mil/uL — AB (ref 4.22–5.81)
RDW: 15.3 % (ref 11.5–15.5)
WBC: 6.9 10*3/uL (ref 4.0–10.5)

## 2014-08-24 LAB — HEPATIC FUNCTION PANEL
ALBUMIN: 3.8 g/dL (ref 3.5–5.2)
ALT: 32 U/L (ref 0–53)
AST: 28 U/L (ref 0–37)
Alkaline Phosphatase: 49 U/L (ref 39–117)
Bilirubin, Direct: 0.2 mg/dL (ref 0.0–0.3)
TOTAL PROTEIN: 6.1 g/dL (ref 6.0–8.3)
Total Bilirubin: 0.7 mg/dL (ref 0.2–1.2)

## 2014-08-24 LAB — URINALYSIS, ROUTINE W REFLEX MICROSCOPIC
Bilirubin Urine: NEGATIVE
HGB URINE DIPSTICK: NEGATIVE
KETONES UR: NEGATIVE
Leukocytes, UA: NEGATIVE
Nitrite: NEGATIVE
SPECIFIC GRAVITY, URINE: 1.02 (ref 1.000–1.030)
URINE GLUCOSE: NEGATIVE
UROBILINOGEN UA: 1 (ref 0.0–1.0)
pH: 6.5 (ref 5.0–8.0)

## 2014-08-24 LAB — LIPID PANEL
CHOL/HDL RATIO: 5
Cholesterol: 158 mg/dL (ref 0–200)
HDL: 35.1 mg/dL — AB (ref >39.00)
LDL CALC: 95 mg/dL (ref 0–99)
NONHDL: 122.9
TRIGLYCERIDES: 142 mg/dL (ref 0.0–149.0)
VLDL: 28.4 mg/dL (ref 0.0–40.0)

## 2014-08-24 LAB — HEMOGLOBIN A1C: HEMOGLOBIN A1C: 6.4 % (ref 4.6–6.5)

## 2014-08-24 LAB — TSH: TSH: 1.6 u[IU]/mL (ref 0.35–4.50)

## 2014-08-24 LAB — PSA: PSA: 0.37 ng/mL (ref 0.10–4.00)

## 2014-08-28 ENCOUNTER — Ambulatory Visit (INDEPENDENT_AMBULATORY_CARE_PROVIDER_SITE_OTHER): Payer: BLUE CROSS/BLUE SHIELD | Admitting: Internal Medicine

## 2014-08-28 ENCOUNTER — Encounter: Payer: Self-pay | Admitting: Internal Medicine

## 2014-08-28 VITALS — BP 138/88 | HR 99 | Temp 99.2°F | Resp 18 | Ht 70.0 in | Wt 211.1 lb

## 2014-08-28 DIAGNOSIS — Z0000 Encounter for general adult medical examination without abnormal findings: Secondary | ICD-10-CM

## 2014-08-28 DIAGNOSIS — R9431 Abnormal electrocardiogram [ECG] [EKG]: Secondary | ICD-10-CM | POA: Insufficient documentation

## 2014-08-28 DIAGNOSIS — R739 Hyperglycemia, unspecified: Secondary | ICD-10-CM

## 2014-08-28 DIAGNOSIS — E1122 Type 2 diabetes mellitus with diabetic chronic kidney disease: Secondary | ICD-10-CM | POA: Insufficient documentation

## 2014-08-28 DIAGNOSIS — R7303 Prediabetes: Secondary | ICD-10-CM | POA: Insufficient documentation

## 2014-08-28 DIAGNOSIS — E119 Type 2 diabetes mellitus without complications: Secondary | ICD-10-CM | POA: Insufficient documentation

## 2014-08-28 DIAGNOSIS — I10 Essential (primary) hypertension: Secondary | ICD-10-CM

## 2014-08-28 HISTORY — DX: Hyperglycemia, unspecified: R73.9

## 2014-08-28 MED ORDER — BENAZEPRIL HCL 40 MG PO TABS: 40.0000 mg | ORAL_TABLET | Freq: Every day | ORAL | Status: DC

## 2014-08-28 MED ORDER — FUROSEMIDE 20 MG PO TABS: ORAL_TABLET | ORAL | Status: DC

## 2014-08-28 MED ORDER — CARVEDILOL 25 MG PO TABS: 25.0000 mg | ORAL_TABLET | Freq: Two times a day (BID) | ORAL | Status: DC

## 2014-08-28 NOTE — Assessment & Plan Note (Signed)
Though adequate here, likely not well controlled at home, has good med compliance, will incraease coreg 25 bid

## 2014-08-28 NOTE — Progress Notes (Signed)
Subjective:    Patient ID: Matthew Guerrero, male    DOB: January 03, 1956, 59 y.o.   MRN: [id number]  HPI Here for wellness and f/u;  Overall doing ok;  Pt denies Chest pain, worsening SOB, DOE, wheezing, orthopnea, PND, worsening LE edema, palpitations, dizziness or syncope.  Pt denies neurological change such as new headache, facial or extremity weakness.  Pt denies polydipsia, polyuria, or low sugar symptoms. Pt states overall good compliance with treatment and medications, good tolerability, and has been trying to follow appropriate diet.  Pt denies worsening depressive symptoms, suicidal ideation or panic. No fever, night sweats, wt loss, loss of appetite, or other constitutional symptoms.  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 ALLERGIC RHINITIS   . GOUT   . HYPERLIPIDEMIA   . HYPERTENSION   . Asthma   . Cervical radiculitis   . Lateral epicondylitis of right elbow    Past Surgical History  Procedure Laterality Date  . Colonoscopy      reports that he has never smoked. He has never used smokeless tobacco. He reports that he does not drink alcohol or use illicit drugs. family history includes Cancer in his mother; Diabetes in his father; Heart disease in his father. There is no history of Colon cancer, Esophageal cancer, Rectal cancer, or Stomach cancer. No Known Allergies Current Outpatient Prescriptions on File Prior to Visit  Medication Sig Dispense Refill  . benazepril (LOTENSIN) 40 MG tablet Take 1 tablet (40 mg total) by mouth daily. 90 tablet 3  . carvedilol (COREG) 12.5 MG tablet Take 1 tablet (12.5 mg total) by mouth 2 (two) times daily. 180 tablet 3  . colchicine 0.6 MG tablet Take 1 tablet (0.6  mg total) by mouth daily. 90 tablet 1  . cyclobenzaprine (FLEXERIL) 10 MG tablet Take 1 tablet (10 mg total) by mouth 2 (two) times daily as needed for muscle spasms. 20 tablet 0  . furosemide (LASIX) 20 MG tablet TAKE ONE TABLET BY MOUTH ONE TIME DAILY 90 tablet 3  . Saw Palmetto 450 MG CAPS Take 450 mg by mouth daily.     . traMADol (ULTRAM) 50 MG tablet Take 1 tablet (50 mg total) by mouth every 8 (eight) hours as needed. 270 tablet 1   No current facility-administered medications on file prior to visit.   Review of Systems Constitutional: Negative for increased diaphoresis, other activity, appetite or siginficant weight change other than noted HENT: Negative for worsening hearing loss, ear pain, facial swelling, mouth sores and neck stiffness.   Eyes: Negative for other worsening pain, redness or visual disturbance.  Respiratory: Negative for shortness of breath and wheezing  Cardiovascular: Negative for chest pain and palpitations.  Gastrointestinal: Negative for diarrhea, blood in stool, abdominal distention or other pain Genitourinary: Negative for hematuria, flank pain or change in urine volume.  Musculoskeletal: Negative for myalgias or other joint complaints.  Skin: Negative for color change and wound or drainage.  Neurological: Negative for syncope and numbness. other than noted Hematological: Negative for adenopathy. or other swelling Psychiatric/Behavioral: Negative for hallucinations, SI, self-injury, decreased concentration or other worsening agitation.      Objective:   Physical Exam BP 138/88 mmHg  Pulse 99  Temp(Src)  99.2 F (37.3 C) (Oral)  Resp 18  Ht 5\' 10"  (1.778 m)  Wt 211 lb 1.9 oz (95.763 kg)  BMI 30.29 kg/m2  SpO2 97% VS noted,  Constitutional: Pt is oriented to person, place, and time. Appears well-developed and well-nourished, in no significant distress Head: Normocephalic and atraumatic.  Right Ear: External ear normal.  Left Ear: External ear  normal.  Nose: Nose normal.  Mouth/Throat: Oropharynx is clear and moist.  Eyes: Conjunctivae and EOM are normal. Pupils are equal, round, and reactive to light.  Neck: Normal range of motion. Neck supple. No JVD present. No tracheal deviation present or significant neck LA or mass Cardiovascular: Normal rate, regular rhythm, normal heart sounds and intact distal pulses.   Pulmonary/Chest: Effort normal and breath sounds without rales or wheezing  Abdominal: Soft. Bowel sounds are normal. NT. No HSM  Musculoskeletal: Normal range of motion. Exhibits no edema.  Lymphadenopathy:  Has no cervical adenopathy.  Neurological: Pt is alert and oriented to person, place, and time. Pt has normal reflexes. No cranial nerve deficit. Motor grossly intact Skin: Skin is warm and dry. No rash noted.  Psychiatric:  Has normal mood and affect. Behavior is normal.      Assessment & Plan:

## 2014-08-28 NOTE — Assessment & Plan Note (Signed)
Mild, ? PreDM, for increased activity, wt loss

## 2014-08-28 NOTE — Assessment & Plan Note (Signed)
Worsening SR with LVH and now strain, for echo, refer cardiology

## 2014-08-28 NOTE — Addendum Note (Signed)
Addended by: Valerie Salts on: 08/28/2014 09:04 AM   Modules accepted: Orders

## 2014-08-28 NOTE — Assessment & Plan Note (Signed)

## 2014-08-28 NOTE — Progress Notes (Signed)
Pre visit review using our clinic review tool, if applicable. No additional management support is needed unless otherwise documented below in the visit note. 

## 2014-08-28 NOTE — Patient Instructions (Signed)
OK to increase the coreg to 25 mg twice per day  Please continue all other medications as before, and refills have been done if requested.  Please have the pharmacy call with any other refills you may need.  Please continue your efforts at being more active, low cholesterol diet, and weight control.  You are otherwise up to date with prevention measures today.  Please keep your appointments with your specialists as you may have planned  Your EKG was mildly changed today - You will be contacted regarding the referral for: echocardiogram, and Cardiology  Your lab work was OK today  Please return in 6 months, or sooner if needed

## 2014-08-28 NOTE — Addendum Note (Signed)
Addended by: Biagio Borg on: 08/28/2014 09:03 AM   Modules accepted: Miquel Dunn

## 2014-08-29 ENCOUNTER — Telehealth: Payer: Self-pay | Admitting: Internal Medicine

## 2014-08-29 NOTE — Telephone Encounter (Signed)
emmi emailed °

## 2014-09-01 ENCOUNTER — Other Ambulatory Visit: Payer: Self-pay | Admitting: Internal Medicine

## 2014-09-05 ENCOUNTER — Telehealth: Payer: Self-pay | Admitting: Internal Medicine

## 2014-09-05 NOTE — Telephone Encounter (Signed)
Patient stated that he haven't heard anything from cardiology to set up an appt. Please advise

## 2014-09-06 NOTE — Telephone Encounter (Signed)
Left message for patient to call back  

## 2014-09-08 ENCOUNTER — Encounter: Payer: Self-pay | Admitting: Family Medicine

## 2014-09-08 ENCOUNTER — Ambulatory Visit (INDEPENDENT_AMBULATORY_CARE_PROVIDER_SITE_OTHER): Payer: BLUE CROSS/BLUE SHIELD | Admitting: Family Medicine

## 2014-09-08 VITALS — BP 120/98 | HR 91 | Temp 99.3°F | Wt 213.5 lb

## 2014-09-08 DIAGNOSIS — A084 Viral intestinal infection, unspecified: Secondary | ICD-10-CM

## 2014-09-08 NOTE — Progress Notes (Signed)
Pre visit review using our clinic review tool, if applicable. No additional management support is needed unless otherwise documented below in the visit note. 

## 2014-09-08 NOTE — Progress Notes (Signed)
[facility] [place], [place] Phone: [phone]  Patient ID: Matthew Guerrero MRN: [id number], DOB: 1955/10/06, 59 y.o. Date of Encounter: 09/08/2014  Primary Physician:  Cathlean Cower, MD   Chief Complaint  Patient presents with  . Diarrhea    loose--normal in color---with every meal--no blood in stool--no sick contacts he is aware of  . Abdominal Pain    epigastric region--x 1 week ago after being seen by Dr John--pt states he did not have Sx until after the increase in Carvedilol and not sure if this is related  . Nausea  . Emesis    Subjective:   History of Present Illness:  Matthew Guerrero is a 59 y.o. pleasant patient who presents with the following:  Pleasant gentleman who is here for approximately 5 days of loose stool, epigastric pain, and he also had some episodes of emesis as well.  He gets a relatively intense vomiting on Thursday.  He is having loose stools, rather than watery diarrhea.  There is no blood or mucus.  He has been able to keep liquids down and solids down.  Started to get pain on Monday, and hurts when picking her up. Thursday - throwing up.  Diarrhea, loose / not runny. Thursday was the first time throwing up. None since then.   The PMH, PSH, Social History, Family History, Medications, and allergies have been reviewed in [facility], and have been updated if relevant.  ROS: GEN: Acute illness details above GI: Tolerating PO intake GU: maintaining adequate hydration and urination Pulm: No SOB Interactive and getting along well at home.  Otherwise, ROS is as per the HPI.   Objective:   Physical Examination: BP 120/98 mmHg  Pulse 91  Temp(Src) 99.3 F (37.4 C) (Oral)  Wt 213 lb 8 oz (96.843 kg)  SpO2 97%   GEN: WDWN, NAD, Non-toxic, A & O x 3 HEENT: Atraumatic, Normocephalic. Neck supple. No masses, No LAD. Ears and Nose: No external deformity. CV: RRR, No M/G/R. No JVD.  No thrill. No extra heart sounds. PULM: CTA B, no wheezes, crackles, rhonchi. No retractions. No resp. distress. No accessory muscle use. ABD: S, NT, ND, hyperactive BS, No rebound, No HSM  EXTR: No c/c/e NEURO Normal gait.  PSYCH: Normally interactive. Conversant. Not depressed or anxious appearing.  Calm demeanor.   Laboratory and Imaging Data:  Assessment & Plan:   Viral gastroenteritis  Benign abd exam. Reassured him.  Refer to the patient instructions sections for details of plan shared with patient.   Patient Instructions  Prevent dehydration: drink Gatorade, Pedialyte, Ginger Ale, popsicles  Immodium A-D over ther counter or   Pepto-Bismol or Maalox  day 1: clear liquids day 1: clear liquids--2-3 oz every 45-60 min. SMALL SIPS OR ICE CHIPS 7-up, ginger ale, sprite tea--no cofee chicken broth plain jello Water  Day 2: contnue clear liquids and add BRAT diet B--banana R--rice---can use chicken noodle or rice soup A--apple sauce T--dry toast GRITS, CREAM OF WHEAT, OATMEAL OK  Day 3: continue day 2 and add simple, non fat non spicy foods1 at a time to diet as canned peaches or pears backed or broiled chicken breast---or lunch meat boiled white potato-cook in chicken broth Chicken and rice or chicken noodles     Signed,  Nelta Caudill T. Emmilee Reamer, MD   Patient's Medications  New Prescriptions   No medications on file  Previous Medications   BENAZEPRIL (LOTENSIN) 40 MG TABLET    Take  1 tablet (40 mg total) by mouth daily.   CARVEDILOL (COREG) 12.5 MG TABLET    TAKE 1 TABLET BY MOUTH TWICE DAILY   CARVEDILOL (COREG) 25 MG TABLET    Take 1 tablet (25 mg total) by mouth 2 (two) times daily with a meal.   COLCHICINE 0.6 MG TABLET    Take 1 tablet (0.6 mg total) by mouth daily.   CYCLOBENZAPRINE (FLEXERIL) 10 MG TABLET    Take 1 tablet (10 mg total) by mouth 2 (two) times daily as needed for muscle spasms.   FUROSEMIDE (LASIX) 20 MG TABLET    TAKE ONE TABLET BY MOUTH  ONE TIME DAILY   FUROSEMIDE (LASIX) 20 MG TABLET    TAKE 1 TABLET BY MOUTH EVERY DAY   SAW PALMETTO 450 MG CAPS    Take 450 mg by mouth daily.    TRAMADOL (ULTRAM) 50 MG TABLET    Take 1 tablet (50 mg total) by mouth every 8 (eight) hours as needed.  Modified Medications   No medications on file  Discontinued Medications   No medications on file

## 2014-09-08 NOTE — Patient Instructions (Signed)
Prevent dehydration: drink Gatorade, Pedialyte, Ginger Ale, popsicles  Immodium A-D over ther counter or   Pepto-Bismol or Maalox  day 1: clear liquids day 1: clear liquids--2-3 oz every 45-60 min. SMALL SIPS OR ICE CHIPS 7-up, ginger ale, sprite tea--no cofee chicken broth plain jello Water  Day 2: contnue clear liquids and add BRAT diet B--banana R--rice---can use chicken noodle or rice soup A--apple sauce T--dry toast GRITS, CREAM OF WHEAT, OATMEAL OK  Day 3: continue day 2 and add simple, non fat non spicy foods1 at a time to diet as canned peaches or pears backed or broiled chicken breast---or lunch meat boiled white potato-cook in chicken broth Chicken and rice or chicken noodles

## 2014-09-11 ENCOUNTER — Emergency Department (HOSPITAL_COMMUNITY): Payer: BLUE CROSS/BLUE SHIELD

## 2014-09-11 ENCOUNTER — Encounter (HOSPITAL_COMMUNITY): Payer: Self-pay

## 2014-09-11 ENCOUNTER — Emergency Department (HOSPITAL_COMMUNITY)
Admission: EM | Admit: 2014-09-11 | Discharge: 2014-09-11 | Disposition: A | Discharge status: Home / Self Care | Payer: BLUE CROSS/BLUE SHIELD | Attending: Emergency Medicine | Admitting: Emergency Medicine

## 2014-09-11 DIAGNOSIS — J45909 Unspecified asthma, uncomplicated: Secondary | ICD-10-CM | POA: Diagnosis not present

## 2014-09-11 DIAGNOSIS — Z79899 Other long term (current) drug therapy: Secondary | ICD-10-CM | POA: Insufficient documentation

## 2014-09-11 DIAGNOSIS — R1011 Right upper quadrant pain: Secondary | ICD-10-CM

## 2014-09-11 DIAGNOSIS — J90 Pleural effusion, not elsewhere classified: Secondary | ICD-10-CM | POA: Insufficient documentation

## 2014-09-11 DIAGNOSIS — R197 Diarrhea, unspecified: Secondary | ICD-10-CM | POA: Insufficient documentation

## 2014-09-11 DIAGNOSIS — R112 Nausea with vomiting, unspecified: Secondary | ICD-10-CM | POA: Diagnosis not present

## 2014-09-11 DIAGNOSIS — Z9889 Other specified postprocedural states: Secondary | ICD-10-CM | POA: Diagnosis not present

## 2014-09-11 DIAGNOSIS — I10 Essential (primary) hypertension: Secondary | ICD-10-CM | POA: Insufficient documentation

## 2014-09-11 DIAGNOSIS — Z8739 Personal history of other diseases of the musculoskeletal system and connective tissue: Secondary | ICD-10-CM | POA: Insufficient documentation

## 2014-09-11 DIAGNOSIS — R1013 Epigastric pain: Secondary | ICD-10-CM | POA: Insufficient documentation

## 2014-09-11 DIAGNOSIS — M109 Gout, unspecified: Secondary | ICD-10-CM | POA: Insufficient documentation

## 2014-09-11 DIAGNOSIS — Z8639 Personal history of other endocrine, nutritional and metabolic disease: Secondary | ICD-10-CM | POA: Diagnosis not present

## 2014-09-11 LAB — COMPREHENSIVE METABOLIC PANEL
ALBUMIN: 3.7 g/dL (ref 3.5–5.2)
ALT: 122 U/L — ABNORMAL HIGH (ref 0–53)
ANION GAP: 10 (ref 5–15)
AST: 69 U/L — AB (ref 0–37)
Alkaline Phosphatase: 35 U/L — ABNORMAL LOW (ref 39–117)
BUN: 11 mg/dL (ref 6–23)
CO2: 22 mmol/L (ref 19–32)
CREATININE: 1.31 mg/dL (ref 0.50–1.35)
Calcium: 8.8 mg/dL (ref 8.4–10.5)
Chloride: 106 mmol/L (ref 96–112)
GFR calc Af Amer: 67 mL/min — ABNORMAL LOW (ref >90)
GFR calc non Af Amer: 58 mL/min — ABNORMAL LOW (ref >90)
Glucose, Bld: 151 mg/dL — ABNORMAL HIGH (ref 70–99)
Potassium: 4.5 mmol/L (ref 3.5–5.1)
Sodium: 138 mmol/L (ref 135–145)
TOTAL PROTEIN: 6.1 g/dL (ref 6.0–8.3)
Total Bilirubin: 1.4 mg/dL — ABNORMAL HIGH (ref 0.3–1.2)

## 2014-09-11 LAB — URINE MICROSCOPIC-ADD ON

## 2014-09-11 LAB — URINALYSIS, ROUTINE W REFLEX MICROSCOPIC
BILIRUBIN URINE: NEGATIVE
Glucose, UA: NEGATIVE mg/dL
Hgb urine dipstick: NEGATIVE
Ketones, ur: NEGATIVE mg/dL
Leukocytes, UA: NEGATIVE
NITRITE: NEGATIVE
PH: 5 (ref 5.0–8.0)
Protein, ur: 100 mg/dL — AB
SPECIFIC GRAVITY, URINE: 1.015 (ref 1.005–1.030)
Urobilinogen, UA: 0.2 mg/dL (ref 0.0–1.0)

## 2014-09-11 LAB — CBC WITH DIFFERENTIAL/PLATELET
Basophils Absolute: 0 10*3/uL (ref 0.0–0.1)
Basophils Relative: 0 % (ref 0–1)
EOS ABS: 0.1 10*3/uL (ref 0.0–0.7)
EOS PCT: 1 % (ref 0–5)
HCT: 52.8 % — ABNORMAL HIGH (ref 39.0–52.0)
Hemoglobin: 17.7 g/dL — ABNORMAL HIGH (ref 13.0–17.0)
Lymphocytes Relative: 23 % (ref 12–46)
Lymphs Abs: 2.2 10*3/uL (ref 0.7–4.0)
MCH: 28.9 pg (ref 26.0–34.0)
MCHC: 33.5 g/dL (ref 30.0–36.0)
MCV: 86.3 fL (ref 78.0–100.0)
Monocytes Absolute: 0.8 10*3/uL (ref 0.1–1.0)
Monocytes Relative: 8 % (ref 3–12)
NEUTROS PCT: 67 % (ref 43–77)
Neutro Abs: 6.2 10*3/uL (ref 1.7–7.7)
Platelets: 110 10*3/uL — ABNORMAL LOW (ref 150–400)
RBC: 6.12 MIL/uL — ABNORMAL HIGH (ref 4.22–5.81)
RDW: 15.5 % (ref 11.5–15.5)
WBC: 9.3 10*3/uL (ref 4.0–10.5)

## 2014-09-11 LAB — LIPASE, BLOOD: Lipase: 31 U/L (ref 11–59)

## 2014-09-11 MED ORDER — ONDANSETRON HCL 4 MG/2ML IJ SOLN
4.0000 mg | Freq: Once | INTRAMUSCULAR | Status: AC
  Administered 2014-09-11: 4 mg via INTRAVENOUS
  Filled 2014-09-11: qty 2

## 2014-09-11 MED ORDER — ONDANSETRON 4 MG PO TBDP: ORAL_TABLET | ORAL | Status: DC

## 2014-09-11 MED ORDER — HYDROMORPHONE HCL 1 MG/ML IJ SOLN
1.0000 mg | Freq: Once | INTRAMUSCULAR | Status: AC
  Administered 2014-09-11: 1 mg via INTRAVENOUS
  Filled 2014-09-11: qty 1

## 2014-09-11 NOTE — Discharge Instructions (Signed)

## 2014-09-11 NOTE — ED Provider Notes (Signed)
CSN: [id number]     Arrival date & time 09/11/14  0756 History   First MD Initiated Contact with Patient 09/11/14 0800     Chief Complaint  Patient presents with  . Abdominal Pain  . Emesis     (Consider location/radiation/quality/duration/timing/severity/associated sxs/prior Treatment) Patient is a 59 y.o. male presenting with abdominal pain and vomiting. The history is provided by the patient.  Abdominal Pain Pain location:  Epigastric and RUQ Pain quality: aching   Pain radiates to:  Does not radiate Pain severity:  Moderate Onset quality:  Gradual Duration:  5 days Timing:  Constant Progression:  Unchanged Chronicity:  New Context: not retching and not sick contacts   Relieved by:  Nothing Worsened by:  Nothing tried Associated symptoms: diarrhea, nausea and vomiting   Associated symptoms: no chills, no cough, no fever and no shortness of breath   Emesis Associated symptoms: abdominal pain and diarrhea   Associated symptoms: no chills     Past Medical History  Diagnosis Date  . ALLERGIC RHINITIS   . GOUT   . HYPERLIPIDEMIA   . HYPERTENSION   . Asthma   . Cervical radiculitis   . Lateral epicondylitis of right elbow   . Hyperglycemia 08/28/2014   Past Surgical History  Procedure Laterality Date  . Colonoscopy     Family History  Problem Relation Age of Onset  . Cancer Mother   . Diabetes Father   . Heart disease Father   . Colon cancer Neg Hx   . Esophageal cancer Neg Hx   . Rectal cancer Neg Hx   . Stomach cancer Neg Hx    History  Substance Use Topics  . Smoking status: Never Smoker   . Smokeless tobacco: Never Used  . Alcohol Use: No    Review of Systems  Constitutional: Negative for fever and chills.  Respiratory: Negative for cough and shortness of breath.   Gastrointestinal: Positive for nausea, vomiting, abdominal pain and diarrhea.  All other systems reviewed and are negative.     Allergies  Review of patient's allergies indicates no  known allergies.  Home Medications   Prior to Admission medications   Medication Sig Start Date End Date Taking? Authorizing Provider  benazepril (LOTENSIN) 40 MG tablet Take 1 tablet (40 mg total) by mouth daily. 08/28/14   Biagio Borg, MD  carvedilol (COREG) 12.5 MG tablet TAKE 1 TABLET BY MOUTH TWICE DAILY 09/03/14   Biagio Borg, MD  carvedilol (COREG) 25 MG tablet Take 1 tablet (25 mg total) by mouth 2 (two) times daily with a meal. Patient not taking: Reported on 09/08/2014 08/28/14   Biagio Borg, MD  colchicine 0.6 MG tablet Take 1 tablet (0.6 mg total) by mouth daily. 04/18/14   Biagio Borg, MD  cyclobenzaprine (FLEXERIL) 10 MG tablet Take 1 tablet (10 mg total) by mouth 2 (two) times daily as needed for muscle spasms. 06/22/14   Leonard Schwartz, MD  furosemide (LASIX) 20 MG tablet TAKE ONE TABLET BY MOUTH ONE TIME DAILY 08/28/14   Biagio Borg, MD  furosemide (LASIX) 20 MG tablet TAKE 1 TABLET BY MOUTH EVERY DAY 09/03/14   Biagio Borg, MD  Saw Palmetto 450 MG CAPS Take 450 mg by mouth daily.     Historical Provider, MD  traMADol (ULTRAM) 50 MG tablet Take 1 tablet (50 mg total) by mouth every 8 (eight) hours as needed. 06/22/14   Leonard Schwartz, MD   BP 123/98 mmHg  Pulse  93  Temp(Src) 97.7 F (36.5 C) (Oral)  Resp 18  SpO2 100% Physical Exam  Constitutional: He is oriented to person, place, and time. He appears well-developed and well-nourished. No distress.  HENT:  Head: Normocephalic and atraumatic.  Mouth/Throat: Oropharynx is clear and moist. No oropharyngeal exudate.  Eyes: EOM are normal. Pupils are equal, round, and reactive to light.  Neck: Normal range of motion. Neck supple.  Cardiovascular: Normal rate and regular rhythm.  Exam reveals no friction rub.   No murmur heard. Pulmonary/Chest: Effort normal and breath sounds normal. No respiratory distress. He has no wheezes. He has no rales.  Abdominal: He exhibits no distension and no mass. There is tenderness (epigastric, RUQ).  There is no rebound and no guarding.  Musculoskeletal: Normal range of motion. He exhibits no edema.  Neurological: He is alert and oriented to person, place, and time.  Skin: No rash noted. He is not diaphoretic.  Nursing note and vitals reviewed.   ED Course  Procedures (including critical care time) Labs Review Labs Reviewed  CBC WITH DIFFERENTIAL/PLATELET  COMPREHENSIVE METABOLIC PANEL  URINALYSIS, ROUTINE W REFLEX MICROSCOPIC  LIPASE, BLOOD    Imaging Review No results found.   EKG Interpretation   Date/Time:  Tuesday September 11 2014 08:23:49 EDT Ventricular Rate:  89 PR Interval:  152 QRS Duration: 110 QT Interval:  401 QTC Calculation: 488 R Axis:   93 Text Interpretation:  Sinus rhythm Probable left atrial enlargement Right  axis deviation Consider left ventricular hypertrophy Abnormal T, consider  ischemia, inferior leads T wave flattening seen on prior, but no T wave  inversion like today. V6 T wave inversion similar to prior Reconfirmed by  [facility]  MD, [place] ([id number]) on 09/11/2014 8:47:12 AM       EMERGENCY DEPARTMENT [place] CARDIAC EXAM "Study: Limited Ultrasound of the heart and pericardium"  INDICATIONS:Possible effusion read on xray Multiple views of the heart and pericardium are obtained with a multi-frequency probe.  PERFORMED VZ:DGLOVF  IMAGES ARCHIVED?: Yes  FINDINGS: No pericardial effusion, global hypokinesis  LIMITATIONS:  None  VIEWS USED: Subcostal 4 chamber, Parasternal long axis, Parasternal short axis and Apical 4 chamber   INTERPRETATION: Cardiac activity present, Pericardial effusioin absent and Decreased contractility  COMMENT:    MDM   Final diagnoses:  RUQ pain  Pleural effusion, right  Non-intractable vomiting with nausea, vomiting of unspecified type    59 year old male here with abdominal pain, vomiting. He said looser stools but not full watery diarrhea. No fevers. Been going on for the past 5 days. Initially started with  epigastric pain that has worsened with flow in his diarrhea. He is able to eat and has had normal urine output. On exam vitals are stable. He has epigastric and right upper quadrant pain. No lower abdominal pain. No Murphy sign. We'll plan on ultrasound of the right upper quadrant, labs. Will give pain medicine and nausea medicine.  Ultrasound is normal, however states right pleural effusion. Chest x-ray shows large heart silhouette, concerning for possible pericardial effusion. Bedside ultrasound by me shows no pericardial effusion but I did notice overall hypokinesis. I told patient there is a chance he has some heart failure and we can run some further tests. Patient has follow-up with cardiology in one month, would rather follow-up at that time. He's here for nausea and vomiting which has improved. He is not here for chest pain or shortness of breath.  Stable for discharge.  Evelina Bucy, MD 09/11/14 1201

## 2014-09-11 NOTE — ED Notes (Signed)
US in room 

## 2014-09-11 NOTE — ED Notes (Signed)
Pt with n/v/d and abdominal pain since Thursday.  Has been seen by MD and told to take OTC meds and limit diet

## 2014-09-25 ENCOUNTER — Encounter (HOSPITAL_COMMUNITY): Payer: Self-pay | Admitting: *Deleted

## 2014-09-25 ENCOUNTER — Emergency Department (HOSPITAL_COMMUNITY): Payer: BLUE CROSS/BLUE SHIELD

## 2014-09-25 ENCOUNTER — Inpatient Hospital Stay (HOSPITAL_COMMUNITY)
Admission: EM | Admit: 2014-09-25 | Discharge: 2014-09-28 | DRG: 287 | Disposition: A | Discharge status: Home / Self Care | Payer: BLUE CROSS/BLUE SHIELD | Attending: Internal Medicine | Admitting: Internal Medicine

## 2014-09-25 DIAGNOSIS — I5021 Acute systolic (congestive) heart failure: Principal | ICD-10-CM | POA: Diagnosis present

## 2014-09-25 DIAGNOSIS — R778 Other specified abnormalities of plasma proteins: Secondary | ICD-10-CM | POA: Diagnosis present

## 2014-09-25 DIAGNOSIS — Z79899 Other long term (current) drug therapy: Secondary | ICD-10-CM | POA: Diagnosis not present

## 2014-09-25 DIAGNOSIS — R9431 Abnormal electrocardiogram [ECG] [EKG]: Secondary | ICD-10-CM | POA: Diagnosis present

## 2014-09-25 DIAGNOSIS — I10 Essential (primary) hypertension: Secondary | ICD-10-CM

## 2014-09-25 DIAGNOSIS — M109 Gout, unspecified: Secondary | ICD-10-CM | POA: Diagnosis present

## 2014-09-25 DIAGNOSIS — I509 Heart failure, unspecified: Secondary | ICD-10-CM | POA: Diagnosis not present

## 2014-09-25 DIAGNOSIS — R0602 Shortness of breath: Secondary | ICD-10-CM | POA: Diagnosis present

## 2014-09-25 DIAGNOSIS — E785 Hyperlipidemia, unspecified: Secondary | ICD-10-CM | POA: Diagnosis present

## 2014-09-25 DIAGNOSIS — J45909 Unspecified asthma, uncomplicated: Secondary | ICD-10-CM | POA: Diagnosis present

## 2014-09-25 DIAGNOSIS — I429 Cardiomyopathy, unspecified: Secondary | ICD-10-CM | POA: Diagnosis present

## 2014-09-25 DIAGNOSIS — R7989 Other specified abnormal findings of blood chemistry: Secondary | ICD-10-CM | POA: Diagnosis not present

## 2014-09-25 DIAGNOSIS — Z8249 Family history of ischemic heart disease and other diseases of the circulatory system: Secondary | ICD-10-CM | POA: Diagnosis not present

## 2014-09-25 DIAGNOSIS — R072 Precordial pain: Secondary | ICD-10-CM

## 2014-09-25 DIAGNOSIS — R0789 Other chest pain: Secondary | ICD-10-CM | POA: Diagnosis present

## 2014-09-25 DIAGNOSIS — D751 Secondary polycythemia: Secondary | ICD-10-CM

## 2014-09-25 DIAGNOSIS — R52 Pain, unspecified: Secondary | ICD-10-CM

## 2014-09-25 DIAGNOSIS — Z9889 Other specified postprocedural states: Secondary | ICD-10-CM

## 2014-09-25 DIAGNOSIS — I428 Other cardiomyopathies: Secondary | ICD-10-CM

## 2014-09-25 HISTORY — DX: Other specified postprocedural states: Z98.890

## 2014-09-25 LAB — HEPATIC FUNCTION PANEL
ALT: 35 U/L (ref 0–53)
AST: 29 U/L (ref 0–37)
Albumin: 3.8 g/dL (ref 3.5–5.2)
Alkaline Phosphatase: 38 U/L — ABNORMAL LOW (ref 39–117)
BILIRUBIN INDIRECT: 0.9 mg/dL (ref 0.3–0.9)
Bilirubin, Direct: 0.2 mg/dL (ref 0.0–0.5)
Total Bilirubin: 1.1 mg/dL (ref 0.3–1.2)
Total Protein: 6.2 g/dL (ref 6.0–8.3)

## 2014-09-25 LAB — TROPONIN I: Troponin I: 0.14 ng/mL — ABNORMAL HIGH (ref <0.031)

## 2014-09-25 LAB — CBC
HCT: 51 % (ref 39.0–52.0)
HEMOGLOBIN: 16.7 g/dL (ref 13.0–17.0)
MCH: 28.4 pg (ref 26.0–34.0)
MCHC: 32.7 g/dL (ref 30.0–36.0)
MCV: 86.7 fL (ref 78.0–100.0)
PLATELETS: 119 10*3/uL — AB (ref 150–400)
RBC: 5.88 MIL/uL — ABNORMAL HIGH (ref 4.22–5.81)
RDW: 15.5 % (ref 11.5–15.5)
WBC: 7.4 10*3/uL (ref 4.0–10.5)

## 2014-09-25 LAB — BASIC METABOLIC PANEL
Anion gap: 11 (ref 5–15)
BUN: 19 mg/dL (ref 6–23)
CO2: 22 mmol/L (ref 19–32)
Calcium: 8.9 mg/dL (ref 8.4–10.5)
Chloride: 107 mmol/L (ref 96–112)
Creatinine, Ser: 1.37 mg/dL — ABNORMAL HIGH (ref 0.50–1.35)
GFR calc Af Amer: 63 mL/min — ABNORMAL LOW (ref >90)
GFR calc non Af Amer: 55 mL/min — ABNORMAL LOW (ref >90)
Glucose, Bld: 115 mg/dL — ABNORMAL HIGH (ref 70–99)
Potassium: 4.3 mmol/L (ref 3.5–5.1)
Sodium: 140 mmol/L (ref 135–145)

## 2014-09-25 LAB — TSH: TSH: 2.242 u[IU]/mL (ref 0.350–4.500)

## 2014-09-25 LAB — I-STAT TROPONIN, ED: TROPONIN I, POC: 0.1 ng/mL — AB (ref 0.00–0.08)

## 2014-09-25 MED ORDER — BENAZEPRIL HCL 40 MG PO TABS
40.0000 mg | ORAL_TABLET | Freq: Every day | ORAL | Status: DC
  Administered 2014-09-26 – 2014-09-27 (×2): 40 mg via ORAL
  Filled 2014-09-25 (×5): qty 1

## 2014-09-25 MED ORDER — FUROSEMIDE 10 MG/ML IJ SOLN
40.0000 mg | Freq: Two times a day (BID) | INTRAMUSCULAR | Status: DC
  Administered 2014-09-26 – 2014-09-27 (×4): 40 mg via INTRAVENOUS
  Filled 2014-09-25 (×4): qty 4

## 2014-09-25 MED ORDER — SODIUM CHLORIDE 0.9 % IJ SOLN: 3.0000 mL | INTRAMUSCULAR | Status: DC | PRN

## 2014-09-25 MED ORDER — TRAMADOL HCL 50 MG PO TABS: 50.0000 mg | ORAL_TABLET | Freq: Three times a day (TID) | ORAL | Status: DC | PRN

## 2014-09-25 MED ORDER — COLCHICINE 0.6 MG PO TABS
0.6000 mg | ORAL_TABLET | Freq: Every day | ORAL | Status: DC
  Administered 2014-09-25 – 2014-09-28 (×4): 0.6 mg via ORAL
  Filled 2014-09-25 (×4): qty 1

## 2014-09-25 MED ORDER — SODIUM CHLORIDE 0.9 % IV SOLN: 250.0000 mL | INTRAVENOUS | Status: DC | PRN

## 2014-09-25 MED ORDER — HEPARIN BOLUS VIA INFUSION
4000.0000 [IU] | Freq: Once | INTRAVENOUS | Status: AC
  Administered 2014-09-25: 4000 [IU] via INTRAVENOUS
  Filled 2014-09-25: qty 4000

## 2014-09-25 MED ORDER — ONDANSETRON HCL 4 MG/2ML IJ SOLN
4.0000 mg | Freq: Four times a day (QID) | INTRAMUSCULAR | Status: DC | PRN
  Administered 2014-09-25: 4 mg via INTRAVENOUS
  Filled 2014-09-25: qty 2

## 2014-09-25 MED ORDER — NITROGLYCERIN 2 % TD OINT
1.0000 [in_us] | TOPICAL_OINTMENT | Freq: Once | TRANSDERMAL | Status: AC
  Administered 2014-09-25: 1 [in_us] via TOPICAL
  Filled 2014-09-25: qty 30

## 2014-09-25 MED ORDER — SODIUM CHLORIDE 0.9 % IJ SOLN
3.0000 mL | Freq: Two times a day (BID) | INTRAMUSCULAR | Status: DC
  Administered 2014-09-25 – 2014-09-27 (×3): 3 mL via INTRAVENOUS

## 2014-09-25 MED ORDER — CARVEDILOL 25 MG PO TABS
25.0000 mg | ORAL_TABLET | Freq: Two times a day (BID) | ORAL | Status: DC
  Administered 2014-09-26 – 2014-09-28 (×5): 25 mg via ORAL
  Filled 2014-09-25 (×5): qty 1

## 2014-09-25 MED ORDER — ASPIRIN 81 MG PO CHEW
324.0000 mg | CHEWABLE_TABLET | Freq: Once | ORAL | Status: AC
  Administered 2014-09-25: 324 mg via ORAL
  Filled 2014-09-25: qty 4

## 2014-09-25 MED ORDER — ACETAMINOPHEN 325 MG PO TABS: 650.0000 mg | ORAL_TABLET | ORAL | Status: DC | PRN

## 2014-09-25 MED ORDER — HEPARIN (PORCINE) IN NACL 100-0.45 UNIT/ML-% IJ SOLN
1250.0000 [IU]/h | INTRAMUSCULAR | Status: DC
  Administered 2014-09-25 – 2014-09-26 (×2): 1250 [IU]/h via INTRAVENOUS
  Filled 2014-09-25 (×2): qty 250

## 2014-09-25 MED ORDER — ONDANSETRON HCL 4 MG/2ML IJ SOLN
4.0000 mg | Freq: Once | INTRAMUSCULAR | Status: AC
  Administered 2014-09-25: 4 mg via INTRAVENOUS
  Filled 2014-09-25: qty 2

## 2014-09-25 MED ORDER — ASPIRIN EC 81 MG PO TBEC
81.0000 mg | DELAYED_RELEASE_TABLET | Freq: Every day | ORAL | Status: DC
  Administered 2014-09-26 – 2014-09-28 (×3): 81 mg via ORAL
  Filled 2014-09-25 (×3): qty 1

## 2014-09-25 NOTE — ED Notes (Signed)
Dr Dewayne Hatch made aware of abnormal troponin

## 2014-09-25 NOTE — ED Provider Notes (Signed)
CSN: [id number]     Arrival date & time 09/25/14  1221 History   First MD Initiated Contact with Patient 09/25/14 1249     Chief Complaint  Patient presents with  . Chest Pain     (Consider location/radiation/quality/duration/timing/severity/associated sxs/prior Treatment) Patient is a 59 y.o. male presenting with chest pain. The history is provided by the patient (the pt complains of chest pain and sweating today.   ).  Chest Pain Pain location:  Substernal area Pain quality: aching   Pain radiates to:  Does not radiate Pain radiates to the back: no   Pain severity:  Moderate Onset quality:  Sudden Timing:  Constant Progression:  Resolved Chronicity:  New Context: not breathing   Associated symptoms: no abdominal pain, no back pain, no cough, no fatigue and no headache     Past Medical History  Diagnosis Date  . ALLERGIC RHINITIS   . GOUT   . HYPERLIPIDEMIA   . HYPERTENSION   . Asthma   . Cervical radiculitis   . Lateral epicondylitis of right elbow   . Hyperglycemia 08/28/2014   Past Surgical History  Procedure Laterality Date  . Colonoscopy     Family History  Problem Relation Age of Onset  . Cancer Mother   . Diabetes Father   . Heart disease Father   . Colon cancer Neg Hx   . Esophageal cancer Neg Hx   . Rectal cancer Neg Hx   . Stomach cancer Neg Hx    History  Substance Use Topics  . Smoking status: Never Smoker   . Smokeless tobacco: Never Used  . Alcohol Use: No    Review of Systems  Constitutional: Negative for appetite change and fatigue.  HENT: Negative for congestion, ear discharge and sinus pressure.   Eyes: Negative for discharge.  Respiratory: Negative for cough.   Cardiovascular: Positive for chest pain.  Gastrointestinal: Negative for abdominal pain and diarrhea.  Genitourinary: Negative for frequency and hematuria.  Musculoskeletal: Negative for back pain.  Skin: Negative for rash.  Neurological: Negative for seizures and headaches.   Psychiatric/Behavioral: Negative for hallucinations.      Allergies  Review of patient's allergies indicates no known allergies.  Home Medications   Prior to Admission medications   Medication Sig Start Date End Date Taking? Authorizing Provider  benazepril (LOTENSIN) 40 MG tablet Take 1 tablet (40 mg total) by mouth daily. 08/28/14  Yes Biagio Borg, MD  carvedilol (COREG) 25 MG tablet Take 1 tablet (25 mg total) by mouth 2 (two) times daily with a meal. 08/28/14  Yes Biagio Borg, MD  colchicine 0.6 MG tablet Take 1 tablet (0.6 mg total) by mouth daily. 04/18/14  Yes Biagio Borg, MD  cyclobenzaprine (FLEXERIL) 10 MG tablet Take 1 tablet (10 mg total) by mouth 2 (two) times daily as needed for muscle spasms. 06/22/14  Yes Leonard Schwartz, MD  furosemide (LASIX) 20 MG tablet TAKE ONE TABLET BY MOUTH ONE TIME DAILY 08/28/14  Yes Biagio Borg, MD  ondansetron (ZOFRAN ODT) 4 MG disintegrating tablet 1 tab q6h PRN nausea/vomiting Patient taking differently: Take 4 mg by mouth every 6 (six) hours as needed for nausea or vomiting.  09/11/14  Yes Evelina Bucy, MD  traMADol (ULTRAM) 50 MG tablet Take 1 tablet (50 mg total) by mouth every 8 (eight) hours as needed. Patient taking differently: Take 50 mg by mouth every 8 (eight) hours as needed for moderate pain.  06/22/14  Yes Leonard Schwartz, MD  carvedilol (COREG) 12.5 MG tablet TAKE 1 TABLET BY MOUTH TWICE DAILY 09/03/14   Biagio Borg, MD   BP 144/86 mmHg  Pulse 91  Temp(Src) 97.8 F (36.6 C) (Oral)  Resp 32  SpO2 95% Physical Exam  Constitutional: He is oriented to person, place, and time. He appears well-developed.  HENT:  Head: Normocephalic.  Eyes: Conjunctivae and EOM are normal. No scleral icterus.  Neck: Neck supple. No thyromegaly present.  Cardiovascular: Normal rate and regular rhythm.  Exam reveals no gallop and no friction rub.   No murmur heard. Pulmonary/Chest: No stridor. He has no wheezes. He has no rales. He exhibits no  tenderness.  Abdominal: He exhibits no distension. There is no tenderness. There is no rebound.  Musculoskeletal: Normal range of motion. He exhibits no edema.  Lymphadenopathy:    He has no cervical adenopathy.  Neurological: He is oriented to person, place, and time. He exhibits normal muscle tone. Coordination normal.  Skin: No rash noted. No erythema.  Psychiatric: He has a normal mood and affect. His behavior is normal.    ED Course  Procedures (including critical care time) Labs Review Labs Reviewed  CBC - Abnormal; Notable for the following:    RBC 5.88 (*)    Platelets 119 (*)    All other components within normal limits  BASIC METABOLIC PANEL - Abnormal; Notable for the following:    Glucose, Bld 115 (*)    Creatinine, Ser 1.37 (*)    GFR calc non Af Amer 55 (*)    GFR calc Af Amer 63 (*)    All other components within normal limits  HEPATIC FUNCTION PANEL - Abnormal; Notable for the following:    Alkaline Phosphatase 38 (*)    All other components within normal limits  I-STAT TROPOININ, ED - Abnormal; Notable for the following:    Troponin i, poc 0.10 (*)    All other components within normal limits  BRAIN NATRIURETIC PEPTIDE    Imaging Review No results found.   EKG Interpretation   Date/Time:  Tuesday September 25 2014 12:29:46 EDT Ventricular Rate:  88 PR Interval:  157 QRS Duration: 115 QT Interval:  397 QTC Calculation: 480 R Axis:   32 Text Interpretation:  Sinus rhythm Biatrial enlargement Left ventricular  hypertrophy Nonspecific T abnormalities, lateral leads Anterior ST  elevation, probably due to LVH Borderline prolonged QT interval Baseline  wander in lead(s) II aVF Confirmed by Delvonte Berenson  MD, Christos Mixson [phone]) on  09/25/2014 12:52:11 PM      MDM   Final diagnoses:  SOB (shortness of breath)  Pain    Pt with chest pain and elevated troponin.  Cardiology to see pt    Milton Ferguson, MD 09/25/14 1447

## 2014-09-25 NOTE — ED Notes (Signed)
Pt reports sharp chest pain that began as intermittent yesterday, now constant, L chest. SOB, dizziness intermittently. Dx last week with CHF, "fluid on lungs" here in ED, has appt set with [facility] 4/19. He has called them but they are unable to move up his appt.

## 2014-09-25 NOTE — Progress Notes (Signed)
ANTICOAGULATION CONSULT NOTE - Initial Consult  Pharmacy Consult for Heparin  Indication: chest pain/ACS  No Known Allergies  Patient Measurements:    Ht: 70 inches Wt: 96.8 kg IBW: 73 kg Heparin Dosing Weight: 93 kg  Vital Signs: Temp: 98.1 F (36.7 C) (04/05 1854) Temp Source: Oral (04/05 1854) BP: 133/84 mmHg (04/05 1930) Pulse Rate: 98 (04/05 1930)  Labs:  Recent Labs  09/25/14 1315  HGB 16.7  HCT 51.0  PLT 119*  CREATININE 1.37*    CrCl cannot be calculated (Unknown ideal weight.).   Medical History: Past Medical History  Diagnosis Date  . ALLERGIC RHINITIS   . GOUT   . HYPERLIPIDEMIA   . HYPERTENSION   . Asthma   . Cervical radiculitis   . Lateral epicondylitis of right elbow   . Hyperglycemia 08/28/2014    Assessment: 64 YOM with progressive and worsening SOB over the past couple of weeks with new CP on exertion. Work-up reveals CHF however CAD cannot be excluded at this time so pharmacy has been consulted to start heparin for anticoagulation while awaiting further cardiology work-up. Hep Wt: 93 kg, baseline Hgb/Hct wnl, plts low at 119.   Goal of Therapy:  Heparin level 0.3-0.7 units/ml Monitor platelets by anticoagulation protocol: Yes   Plan:  1. Heparin 4000 unit bolus x 1  2. Initiate heparin drip at a rate of 1250 units/hr (12.5 ml/hr) 3. Daily heparin levels, CBC 4. Will continue to monitor for any signs/symptoms of bleeding and will follow up with heparin level in 6 hours   Alycia Rossetti, PharmD, BCPS Clinical Pharmacist Pager: [phone] 09/25/2014 8:35 PM

## 2014-09-25 NOTE — H&P (Signed)
ADMISSION HISTORY & PHYSICAL   Chief Complaint:  Chest pain, shortness of breath  Cardiologist: None (NEW)  Primary Care Physician: Cathlean Cower, MD  HPI:  This is a 59 y.o. male with a past medical history significant for hypertension, dyslipidemia, gout, asthma and allergic rhinitis. There is a strong family history of coronary artery disease. He was seen by his primary care provider in early March and was doing well without any complaints for a annual physical. Shortly thereafter he became more nauseated and started having shortness of breath. He was seen in the left lower primary care on-call clinic for loose stool, epigastric pain and vomiting. He was felt to have a viral gastroenteritis. At the time he had normal heart sounds and no chest pain or shortness of breath. His symptoms worsened and he was seen 3 days later in the emergency department on 09/11/14. He was again complaining of abdominal pain and vomiting. He did have an EKG which is abnormal demonstrating T-wave changes and LVH. Chest x-ray suggested cardiomegaly and possible effusion. He underwent a fast scan, which was interpreted as no pericardial effusion, however there was global hypokinesis. He was informed there was a "slight chance" that he could have heart failure and further tests could be run however since he had already scheduled cardiology follow-up, the patient wished to follow-up in the office. He was subsequently discharged. Since then he's had progressive shortness of breath and chest pain with exertion. He was found to have an elevated troponin today of 0.1. Chest x-ray shows cardiomegaly with new pulmonary vascular congestion consistent with heart failure. Cardiology was asked to consult regarding his chest pain and shortness of breath.  PMHx:  Past Medical History  Diagnosis Date  . ALLERGIC RHINITIS   . GOUT   . HYPERLIPIDEMIA   . HYPERTENSION   . Asthma   . Cervical radiculitis   . Lateral epicondylitis  of right elbow   . Hyperglycemia 08/28/2014    Past Surgical History  Procedure Laterality Date  . Colonoscopy      FAMHx:  Family History  Problem Relation Age of Onset  . Cancer Mother   . Diabetes Father   . Heart disease Father   . Colon cancer Neg Hx   . Esophageal cancer Neg Hx   . Rectal cancer Neg Hx   . Stomach cancer Neg Hx     SOCHx:   reports that he has never smoked. He has never used smokeless tobacco. He reports that he does not drink alcohol or use illicit drugs.  ALLERGIES:  No Known Allergies  ROS: A comprehensive review of systems was negative except for: Respiratory: positive for dyspnea on exertion Cardiovascular: positive for exertional chest pressure/discomfort Gastrointestinal: positive for nausea and vomiting  HOME MEDS:   Medication List    ASK your doctor about these medications        benazepril 40 MG tablet  Commonly known as:  LOTENSIN  Take 1 tablet (40 mg total) by mouth daily.     carvedilol 25 MG tablet  Commonly known as:  COREG  Take 1 tablet (25 mg total) by mouth 2 (two) times daily with a meal.     carvedilol 12.5 MG tablet  Commonly known as:  COREG  TAKE 1 TABLET BY MOUTH TWICE DAILY     colchicine 0.6 MG tablet  Take 1 tablet (0.6 mg total) by mouth daily.     cyclobenzaprine 10 MG tablet  Commonly known as:  FLEXERIL  Take 1 tablet (10 mg total) by mouth 2 (two) times daily as needed for muscle spasms.     furosemide 20 MG tablet  Commonly known as:  LASIX  TAKE ONE TABLET BY MOUTH ONE TIME DAILY     ondansetron 4 MG disintegrating tablet  Commonly known as:  ZOFRAN ODT  1 tab q6h PRN nausea/vomiting     traMADol 50 MG tablet  Commonly known as:  ULTRAM  Take 1 tablet (50 mg total) by mouth every 8 (eight) hours as needed.        LABS/IMAGING: Results for orders placed or performed during the hospital encounter of 09/25/14 (from the past 48 hour(s))  CBC     Status: Abnormal   Collection Time: 09/25/14   1:15 PM  Result Value Ref Range   WBC 7.4 4.0 - 10.5 K/uL   RBC 5.88 (H) 4.22 - 5.81 MIL/uL   Hemoglobin 16.7 13.0 - 17.0 g/dL   HCT 51.0 39.0 - 52.0 %   MCV 86.7 78.0 - 100.0 fL   MCH 28.4 26.0 - 34.0 pg   MCHC 32.7 30.0 - 36.0 g/dL   RDW 15.5 11.5 - 15.5 %   Platelets 119 (L) 150 - 400 K/uL    Comment: REPEATED TO VERIFY SPECIMEN CHECKED FOR CLOTS PLATELET COUNT CONFIRMED BY SMEAR   Basic metabolic panel     Status: Abnormal   Collection Time: 09/25/14  1:15 PM  Result Value Ref Range   Sodium 140 135 - 145 mmol/L   Potassium 4.3 3.5 - 5.1 mmol/L   Chloride 107 96 - 112 mmol/L   CO2 22 19 - 32 mmol/L   Glucose, Bld 115 (H) 70 - 99 mg/dL   BUN 19 6 - 23 mg/dL   Creatinine, Ser 1.37 (H) 0.50 - 1.35 mg/dL   Calcium 8.9 8.4 - 10.5 mg/dL   GFR calc non Af Amer 55 (L) >90 mL/min   GFR calc Af Amer 63 (L) >90 mL/min    Comment: (NOTE) The eGFR has been calculated using the CKD EPI equation. This calculation has not been validated in all clinical situations. eGFR's persistently <90 mL/min signify possible Chronic Kidney Disease.    Anion gap 11 5 - 15  Hepatic function panel     Status: Abnormal   Collection Time: 09/25/14  1:15 PM  Result Value Ref Range   Total Protein 6.2 6.0 - 8.3 g/dL   Albumin 3.8 3.5 - 5.2 g/dL   AST 29 0 - 37 U/L   ALT 35 0 - 53 U/L   Alkaline Phosphatase 38 (L) 39 - 117 U/L   Total Bilirubin 1.1 0.3 - 1.2 mg/dL   Bilirubin, Direct 0.2 0.0 - 0.5 mg/dL   Indirect Bilirubin 0.9 0.3 - 0.9 mg/dL  I-stat troponin, ED (not at [facility])     Status: Abnormal   Collection Time: 09/25/14  1:19 PM  Result Value Ref Range   Troponin i, poc 0.10 (HH) 0.00 - 0.08 ng/mL   Comment NOTIFIED PHYSICIAN    Comment 3            Comment: Due to the release kinetics of cTnI, a negative result within the first hours of the onset of symptoms does not rule out myocardial infarction with certainty. If myocardial infarction is still suspected, repeat the test at  appropriate intervals.    Dg Chest 2 View  09/25/2014   CLINICAL DATA:  Abdominal discomfort since 09/11/2014, shortness of breath, suspected CHF, pain,  history hypertension, asthma  EXAM: CHEST  2 VIEW  COMPARISON:  09/11/2014  FINDINGS: Enlargement of cardiac silhouette with pulmonary vascular congestion.  Mediastinal contours normal.  Lungs clear.  No pulmonary infiltrate, pleural effusion or pneumothorax.  Osseous structures unremarkable.  IMPRESSION: Enlargement of cardiac silhouette with pulmonary vascular congestion.  No acute infiltrate or pulmonary edema identified.   Electronically Signed   By: Lavonia Dana M.D.   On: 09/25/2014 15:06    VITALS: Filed Vitals:   09/25/14 1800  BP: 138/84  Pulse: 94  Temp:   Resp: 21    EXAM: General appearance: alert and no distress Neck: JVD - 5 cm above sternal notch and no carotid bruit Lungs: diminished breath sounds bibasilar Heart: regular rate and rhythm, S1, S2 normal, S3 present, systolic murmur: early systolic 3/6, blowing at apex and diastolic murmur: early diastolic 2/6, crescendo at 2nd right intercostal space Abdomen: soft, non-tender; bowel sounds normal; no masses,  no organomegaly Extremities: extremities normal, atraumatic, no cyanosis or edema Pulses: 2+ and symmetric Skin: Skin color, texture, turgor normal. No rashes or lesions Neurologic: Grossly normal Psych: Pleasant, not anxious  IMPRESSION: Principal Problem:   Congestive heart failure Active Problems:   Essential hypertension   Hyperlipidemia   Abnormal ECG   Chest pain   Elevated troponin   PLAN: 1. Shortness of breath/congestive heart failure- according to Dr. Mingo Amber performed a fast scan in the ER last week, there was evidence for cardiomyopathy. He certainly has symptoms that are concerning for heart failure and physical exam findings of elevated JVP and pulmonary edema on x-ray. I suspect his troponin elevation is related to heart failure, but cannot  exclude underlying coronary artery disease as a cause. I would recommend a formal 2-D echocardiogram and starting Lasix 40 mg twice daily. I wonder if this could possibly be a viral cardiomyopathy given his recent nausea, vomiting and diarrhea. He could also be that the symptoms are related to volume overload and splanchnic congestion. 2. Chest pain- there is pain associated with exertion which seems to be relieved at rest. This could be from congestion or suggest underlying coronary artery disease. Would recommend heparinization until we can get a better sense of his troponin curve. If it remains flat and he is found to have significant cardiomyopathy, then I suspect this is not acute coronary syndrome. If there is cardiomyopathy, he may ultimately need a right and left heart catheterization. 3. Murmurs- I can detect 2 distinct heart murmurs and a concerning diastolic murmur. This could be due to a new dilated cardiomyopathy. There is no history of murmur and none that was reported on his recent annual physical.  Based on the high likelihood of further cardiac workup and limited cardiac resources at [facility], would recommend transfer to [facility] and place him on my inpatient service. I discussed this with him and he is agreeable.  Pixie Casino, MD, [facility] Attending Cardiologist [facility]  HILTY,Kenneth C 09/25/2014, 6:51 PM

## 2014-09-25 NOTE — ED Notes (Signed)
Pt returned from xray

## 2014-09-25 NOTE — ED Notes (Signed)
Pt concerned about having no transportation back to [facility] ED to get car after D/C. Per director, Name will be taken and given to social worker to get a cab voucher for pt to get back to car.

## 2014-09-26 LAB — CBC
HCT: 53.1 % — ABNORMAL HIGH (ref 39.0–52.0)
Hemoglobin: 17.3 g/dL — ABNORMAL HIGH (ref 13.0–17.0)
MCH: 28.2 pg (ref 26.0–34.0)
MCHC: 32.6 g/dL (ref 30.0–36.0)
MCV: 86.6 fL (ref 78.0–100.0)
Platelets: 127 10*3/uL — ABNORMAL LOW (ref 150–400)
RBC: 6.13 MIL/uL — AB (ref 4.22–5.81)
RDW: 15.7 % — ABNORMAL HIGH (ref 11.5–15.5)
WBC: 10.3 10*3/uL (ref 4.0–10.5)

## 2014-09-26 LAB — COMPREHENSIVE METABOLIC PANEL
ALK PHOS: 36 U/L — AB (ref 39–117)
ALT: 33 U/L (ref 0–53)
AST: 26 U/L (ref 0–37)
Albumin: 3.2 g/dL — ABNORMAL LOW (ref 3.5–5.2)
Anion gap: 8 (ref 5–15)
BILIRUBIN TOTAL: 1.2 mg/dL (ref 0.3–1.2)
BUN: 14 mg/dL (ref 6–23)
CHLORIDE: 107 mmol/L (ref 96–112)
CO2: 27 mmol/L (ref 19–32)
Calcium: 9.2 mg/dL (ref 8.4–10.5)
Creatinine, Ser: 1.34 mg/dL (ref 0.50–1.35)
GFR calc Af Amer: 65 mL/min — ABNORMAL LOW (ref >90)
GFR calc non Af Amer: 56 mL/min — ABNORMAL LOW (ref >90)
Glucose, Bld: 107 mg/dL — ABNORMAL HIGH (ref 70–99)
POTASSIUM: 4.7 mmol/L (ref 3.5–5.1)
Sodium: 142 mmol/L (ref 135–145)
Total Protein: 5.2 g/dL — ABNORMAL LOW (ref 6.0–8.3)

## 2014-09-26 LAB — TROPONIN I
TROPONIN I: 0.15 ng/mL — AB (ref <0.031)
TROPONIN I: 0.16 ng/mL — AB (ref <0.031)

## 2014-09-26 LAB — MAGNESIUM: MAGNESIUM: 1.9 mg/dL (ref 1.5–2.5)

## 2014-09-26 LAB — BRAIN NATRIURETIC PEPTIDE: B NATRIURETIC PEPTIDE 5: 1293.3 pg/mL — AB (ref 0.0–100.0)

## 2014-09-26 LAB — HEPARIN LEVEL (UNFRACTIONATED)
Heparin Unfractionated: 0.68 IU/mL (ref 0.30–0.70)
Heparin Unfractionated: 0.42 IU/mL (ref 0.30–0.70)

## 2014-09-26 NOTE — Progress Notes (Signed)
DAILY PROGRESS NOTE  Subjective:  Feeling better today. Weight is 206 lbs today. BNP is elevated at 1293. Troponin has remained flat around 0.14-0.15. Awaiting 2D echo today, but will continue to treat for CHF. No further chest pain.  Objective:  Temp:  [97.5 F (36.4 C)-98.2 F (36.8 C)] 98.2 F (36.8 C) (04/06 0544) Pulse Rate:  [48-105] 87 (04/06 0544) Resp:  [16-32] 18 (04/06 0544) BP: (120-151)/(84-112) 120/90 mmHg (04/06 0544) SpO2:  [94 %-100 %] 98 % (04/06 0544) Weight:  [206 lb (93.441 kg)] 206 lb (93.441 kg) (04/05 2027) Weight change:   Intake/Output from previous day:    Intake/Output from this shift:    Medications: Current Facility-Administered Medications  Medication Dose Route Frequency Provider Last Rate Last Dose  . 0.9 %  sodium chloride infusion  250 mL Intravenous PRN Rogelia Mire, NP      . acetaminophen (TYLENOL) tablet 650 mg  650 mg Oral Q4H PRN Rogelia Mire, NP      . aspirin EC tablet 81 mg  81 mg Oral Daily Rogelia Mire, NP   81 mg at 09/26/14 0900  . benazepril (LOTENSIN) tablet 40 mg  40 mg Oral Daily Rogelia Mire, NP      . carvedilol (COREG) tablet 25 mg  25 mg Oral BID WC Rogelia Mire, NP   25 mg at 09/26/14 0900  . colchicine tablet 0.6 mg  0.6 mg Oral Daily Rogelia Mire, NP   0.6 mg at 09/26/14 0900  . furosemide (LASIX) injection 40 mg  40 mg Intravenous BID Rogelia Mire, NP   40 mg at 09/26/14 0900  . heparin ADULT infusion 100 units/mL (25000 units/250 mL)  1,250 Units/hr Intravenous Continuous Rolla Flatten, RPH 12.5 mL/hr at 09/25/14 2120 1,250 Units/hr at 09/25/14 2120  . ondansetron (ZOFRAN) injection 4 mg  4 mg Intravenous Q6H PRN Rogelia Mire, NP   4 mg at 09/25/14 1959  . sodium chloride 0.9 % injection 3 mL  3 mL Intravenous Q12H Rogelia Mire, NP   3 mL at 09/26/14 0900  . sodium chloride 0.9 % injection 3 mL  3 mL Intravenous PRN Rogelia Mire, NP      .  traMADol Veatrice Bourbon) tablet 50 mg  50 mg Oral Q8H PRN Rogelia Mire, NP        Physical Exam: General appearance: alert and no distress Neck: JVD - 4 cm above sternal notch and no carotid bruit Lungs: clear to auscultation bilaterally Heart: regular rate and rhythm, S1, S2 normal, S3 present, systolic murmur: early systolic 3/6, blowing at apex and diastolic murmur: early diastolic 2/6, crescendo at 2nd right intercostal space Abdomen: soft, non-tender; bowel sounds normal; no masses,  no organomegaly Extremities: extremities normal, atraumatic, no cyanosis or edema Pulses: 2+ and symmetric Skin: Skin color, texture, turgor normal. No rashes or lesions Neurologic: Grossly normal  Lab Results: Results for orders placed or performed during the hospital encounter of 09/25/14 (from the past 48 hour(s))  CBC     Status: Abnormal   Collection Time: 09/25/14  1:15 PM  Result Value Ref Range   WBC 7.4 4.0 - 10.5 K/uL   RBC 5.88 (H) 4.22 - 5.81 MIL/uL   Hemoglobin 16.7 13.0 - 17.0 g/dL   HCT 51.0 39.0 - 52.0 %   MCV 86.7 78.0 - 100.0 fL   MCH 28.4 26.0 - 34.0 pg   MCHC 32.7 30.0 - 36.0 g/dL  RDW 15.5 11.5 - 15.5 %   Platelets 119 (L) 150 - 400 K/uL    Comment: REPEATED TO VERIFY SPECIMEN CHECKED FOR CLOTS PLATELET COUNT CONFIRMED BY SMEAR   Basic metabolic panel     Status: Abnormal   Collection Time: 09/25/14  1:15 PM  Result Value Ref Range   Sodium 140 135 - 145 mmol/L   Potassium 4.3 3.5 - 5.1 mmol/L   Chloride 107 96 - 112 mmol/L   CO2 22 19 - 32 mmol/L   Glucose, Bld 115 (H) 70 - 99 mg/dL   BUN 19 6 - 23 mg/dL   Creatinine, Ser 1.37 (H) 0.50 - 1.35 mg/dL   Calcium 8.9 8.4 - 10.5 mg/dL   GFR calc non Af Amer 55 (L) >90 mL/min   GFR calc Af Amer 63 (L) >90 mL/min    Comment: (NOTE) The eGFR has been calculated using the CKD EPI equation. This calculation has not been validated in all clinical situations. eGFR's persistently <90 mL/min signify possible Chronic  Kidney Disease.    Anion gap 11 5 - 15  Hepatic function panel     Status: Abnormal   Collection Time: 09/25/14  1:15 PM  Result Value Ref Range   Total Protein 6.2 6.0 - 8.3 g/dL   Albumin 3.8 3.5 - 5.2 g/dL   AST 29 0 - 37 U/L   ALT 35 0 - 53 U/L   Alkaline Phosphatase 38 (L) 39 - 117 U/L   Total Bilirubin 1.1 0.3 - 1.2 mg/dL   Bilirubin, Direct 0.2 0.0 - 0.5 mg/dL   Indirect Bilirubin 0.9 0.3 - 0.9 mg/dL  I-stat troponin, ED (not at [facility])     Status: Abnormal   Collection Time: 09/25/14  1:19 PM  Result Value Ref Range   Troponin i, poc 0.10 (HH) 0.00 - 0.08 ng/mL   Comment NOTIFIED PHYSICIAN    Comment 3            Comment: Due to the release kinetics of cTnI, a negative result within the first hours of the onset of symptoms does not rule out myocardial infarction with certainty. If myocardial infarction is still suspected, repeat the test at appropriate intervals.   TSH     Status: None   Collection Time: 09/25/14  9:00 PM  Result Value Ref Range   TSH 2.242 0.350 - 4.500 uIU/mL  Troponin I     Status: Abnormal   Collection Time: 09/25/14  9:00 PM  Result Value Ref Range   Troponin I 0.14 (H) <0.031 ng/mL    Comment:        PERSISTENTLY INCREASED TROPONIN VALUES IN THE RANGE OF 0.04-0.49 ng/mL CAN BE SEEN IN:       -UNSTABLE ANGINA       -CONGESTIVE HEART FAILURE       -MYOCARDITIS       -CHEST TRAUMA       -ARRYHTHMIAS       -LATE PRESENTING MYOCARDIAL INFARCTION       -COPD   CLINICAL FOLLOW-UP RECOMMENDED.   Comprehensive metabolic panel     Status: Abnormal   Collection Time: 09/26/14  3:05 AM  Result Value Ref Range   Sodium 142 135 - 145 mmol/L   Potassium 4.7 3.5 - 5.1 mmol/L   Chloride 107 96 - 112 mmol/L   CO2 27 19 - 32 mmol/L   Glucose, Bld 107 (H) 70 - 99 mg/dL   BUN 14 6 - 23 mg/dL  Creatinine, Ser 1.34 0.50 - 1.35 mg/dL   Calcium 9.2 8.4 - 10.5 mg/dL   Total Protein 5.2 (L) 6.0 - 8.3 g/dL   Albumin 3.2 (L) 3.5 - 5.2 g/dL   AST 26 0 - 37  U/L   ALT 33 0 - 53 U/L   Alkaline Phosphatase 36 (L) 39 - 117 U/L   Total Bilirubin 1.2 0.3 - 1.2 mg/dL   GFR calc non Af Amer 56 (L) >90 mL/min   GFR calc Af Amer 65 (L) >90 mL/min    Comment: (NOTE) The eGFR has been calculated using the CKD EPI equation. This calculation has not been validated in all clinical situations. eGFR's persistently <90 mL/min signify possible Chronic Kidney Disease.    Anion gap 8 5 - 15  Magnesium     Status: None   Collection Time: 09/26/14  3:05 AM  Result Value Ref Range   Magnesium 1.9 1.5 - 2.5 mg/dL  Troponin I     Status: Abnormal   Collection Time: 09/26/14  3:05 AM  Result Value Ref Range   Troponin I 0.15 (H) <0.031 ng/mL    Comment:        PERSISTENTLY INCREASED TROPONIN VALUES IN THE RANGE OF 0.04-0.49 ng/mL CAN BE SEEN IN:       -UNSTABLE ANGINA       -CONGESTIVE HEART FAILURE       -MYOCARDITIS       -CHEST TRAUMA       -ARRYHTHMIAS       -LATE PRESENTING MYOCARDIAL INFARCTION       -COPD   CLINICAL FOLLOW-UP RECOMMENDED.   Brain natriuretic peptide     Status: Abnormal   Collection Time: 09/26/14  3:53 AM  Result Value Ref Range   B Natriuretic Peptide 1293.3 (H) 0.0 - 100.0 pg/mL  Heparin level (unfractionated)     Status: None   Collection Time: 09/26/14  3:53 AM  Result Value Ref Range   Heparin Unfractionated 0.68 0.30 - 0.70 IU/mL    Comment:        IF HEPARIN RESULTS ARE BELOW EXPECTED VALUES, AND PATIENT DOSAGE HAS BEEN CONFIRMED, SUGGEST FOLLOW UP TESTING OF ANTITHROMBIN III LEVELS.     Imaging: Dg Chest 2 View  09/25/2014   CLINICAL DATA:  Abdominal discomfort since 09/11/2014, shortness of breath, suspected CHF, pain, history hypertension, asthma  EXAM: CHEST  2 VIEW  COMPARISON:  09/11/2014  FINDINGS: Enlargement of cardiac silhouette with pulmonary vascular congestion.  Mediastinal contours normal.  Lungs clear.  No pulmonary infiltrate, pleural effusion or pneumothorax.  Osseous structures unremarkable.   IMPRESSION: Enlargement of cardiac silhouette with pulmonary vascular congestion.  No acute infiltrate or pulmonary edema identified.   Electronically Signed   By: Lavonia Dana M.D.   On: 09/25/2014 15:06    Assessment:  1. Principal Problem: 2.   Congestive heart failure 3. Active Problems: 4.   Essential hypertension 5.   Hyperlipidemia 6.   Abnormal ECG 7.   Chest pain 8.   Elevated troponin 9.   Acute CHF 10.   Plan:  1. Breathing has improved with diuresis - will need to follow I's/O's. Await 2D echo today - suspect cardiomyopathy, probably dilated with valvular disease. Will likely need R/LHC, possibly tomorrow depending on diuresis - he can lay flat comfortably now.  Continue heparin until coronary status is known - Suspect troponin due to CHF, however, family history of CAD with father that had CABG at the same age.  Time Spent Directly with Patient:  15 minutes  Length of Stay:  LOS: 1 day   Pixie Casino, MD, [facility] Attending Cardiologist [facility]  Autymn Omlor C 09/26/2014, 9:16 AM

## 2014-09-26 NOTE — Progress Notes (Signed)
ANTICOAGULATION CONSULT NOTE - Initial Consult  Pharmacy Consult for Heparin  Indication: chest pain/ACS  No Known Allergies  Patient Measurements: Height: 5\' 10"  (177.8 cm) Weight: 206 lb (93.441 kg) (Simultaneous filing. User may not have seen previous data.) IBW/kg (Calculated) : 73  Ht: 70 inches Wt: 96.8 kg IBW: 73 kg Heparin Dosing Weight: 93 kg  Vital Signs: Temp: 98.2 F (36.8 C) (04/06 0544) Temp Source: Oral (04/06 0544) BP: 120/90 mmHg (04/06 0544) Pulse Rate: 87 (04/06 0544)  Labs:  Recent Labs  09/25/14 1315 09/25/14 2100 09/26/14 0305 09/26/14 0353  HGB 16.7  --   --   --   HCT 51.0  --   --   --   PLT 119*  --   --   --   HEPARINUNFRC  --   --   --  0.68  CREATININE 1.37*  --  1.34  --   TROPONINI  --  0.14* 0.15*  --     Estimated Creatinine Clearance: 67.3 mL/min (by C-G formula based on Cr of 1.34).   Medical History: Past Medical History  Diagnosis Date  . ALLERGIC RHINITIS   . GOUT   . HYPERLIPIDEMIA   . HYPERTENSION   . Asthma   . Cervical radiculitis   . Lateral epicondylitis of right elbow   . Hyperglycemia 08/28/2014    Assessment: 47 YOM with progressive and worsening SOB over the past couple of weeks with new CP on exertion. Work-up revealed CHF however CAD could not be excluded thus started on IV heparin for anticoagulation while awaiting further cardiology work-up. Baseline Hgb/Hct wnl, plts low at 119.   Initial 6 hour heparin level is 0.68, therapeutic on heparin drip rate of 1250 units/hr. Daily CBC pending.  No bleeding noted.   Goal of Therapy:  Heparin level 0.3-0.7 units/ml Monitor platelets by anticoagulation protocol: Yes   Plan:  Continue heparin drip at  1250 units/hr (12.5 ml/hr) 6 hour heparin level to confirm remains therapeutic x2 then  daily heparin levels, CBC Will continue to monitor for any signs/symptoms of bleeding and will follow up with heparin level in 6 hours   Nicole Cella, RPh Clinical  Pharmacist Pager: [phone] 09/26/2014 6:09 AM

## 2014-09-26 NOTE — Progress Notes (Signed)
ANTICOAGULATION CONSULT NOTE - Follow Up Consult  Pharmacy Consult for heparin Indication: chest pain/ACS  No Known Allergies  Patient Measurements: Height: 5\' 10"  (177.8 cm) Weight: 206 lb (93.441 kg) (Simultaneous filing. User may not have seen previous data.) IBW/kg (Calculated) : 73 Heparin Dosing Weight: 93kg  Vital Signs: Temp: 98.7 F (37.1 C) (04/06 1353) Temp Source: Oral (04/06 1353) BP: 119/77 mmHg (04/06 1353) Pulse Rate: 81 (04/06 1353)  Labs:  Recent Labs  09/25/14 1315 09/25/14 2100 09/26/14 0305 09/26/14 0353 09/26/14 0900 09/26/14 1319  HGB 16.7  --   --   --  17.3*  --   HCT 51.0  --   --   --  53.1*  --   PLT 119*  --   --   --  127*  --   HEPARINUNFRC  --   --   --  0.68  --  0.42  CREATININE 1.37*  --  1.34  --   --   --   TROPONINI  --  0.14* 0.15*  --  0.16*  --     Estimated Creatinine Clearance: 67.3 mL/min (by C-G formula based on Cr of 1.34).   Medications:  Scheduled:  . aspirin EC  81 mg Oral Daily  . benazepril  40 mg Oral Daily  . carvedilol  25 mg Oral BID WC  . colchicine  0.6 mg Oral Daily  . furosemide  40 mg Intravenous BID  . sodium chloride  3 mL Intravenous Q12H   Infusions:  . heparin 1,250 Units/hr (09/25/14 2120)    Assessment: 59 YOM with progressive and worsening SOB over the past couple of weeks with new CP on exertion. He is on heparin at 1250 units/hr and heparin level is confirmed at goal (HL= 0.42)  Goal of Therapy:  Heparin level 0.3-0.7 units/ml Monitor platelets by anticoagulation protocol: Yes   Plan:  -No heparin changes needed -Daily heparin level and CBC  Hildred Laser, Pharm D 09/26/2014 2:31 PM

## 2014-09-27 ENCOUNTER — Encounter (HOSPITAL_COMMUNITY)
Admission: EM | Disposition: A | Discharge status: Home / Self Care | Payer: Self-pay | Source: Home / Self Care | Attending: Internal Medicine

## 2014-09-27 ENCOUNTER — Telehealth: Payer: Self-pay | Admitting: Internal Medicine

## 2014-09-27 DIAGNOSIS — I509 Heart failure, unspecified: Secondary | ICD-10-CM

## 2014-09-27 HISTORY — PX: LEFT AND RIGHT HEART CATHETERIZATION WITH CORONARY ANGIOGRAM: SHX5449

## 2014-09-27 LAB — CBC
HEMATOCRIT: 50.8 % (ref 39.0–52.0)
HEMOGLOBIN: 16.9 g/dL (ref 13.0–17.0)
MCH: 28.6 pg (ref 26.0–34.0)
MCHC: 33.3 g/dL (ref 30.0–36.0)
MCV: 86 fL (ref 78.0–100.0)
Platelets: 122 10*3/uL — ABNORMAL LOW (ref 150–400)
RBC: 5.91 MIL/uL — ABNORMAL HIGH (ref 4.22–5.81)
RDW: 15.7 % — AB (ref 11.5–15.5)
WBC: 8.6 10*3/uL (ref 4.0–10.5)

## 2014-09-27 LAB — POCT I-STAT 3, VENOUS BLOOD GAS (G3P V)
Bicarbonate: 26.7 mEq/L — ABNORMAL HIGH (ref 20.0–24.0)
O2 SAT: 69 %
PCO2 VEN: 50.5 mmHg — AB (ref 45.0–50.0)
PO2 VEN: 39 mmHg (ref 30.0–45.0)
TCO2: 28 mmol/L (ref 0–100)
pH, Ven: 7.33 — ABNORMAL HIGH (ref 7.250–7.300)

## 2014-09-27 LAB — POCT I-STAT 3, ART BLOOD GAS (G3+)
ACID-BASE EXCESS: 1 mmol/L (ref 0.0–2.0)
Bicarbonate: 27.2 mEq/L — ABNORMAL HIGH (ref 20.0–24.0)
O2 Saturation: 96 %
TCO2: 29 mmol/L (ref 0–100)
pCO2 arterial: 47.5 mmHg — ABNORMAL HIGH (ref 35.0–45.0)
pH, Arterial: 7.367 (ref 7.350–7.450)
pO2, Arterial: 87 mmHg (ref 80.0–100.0)

## 2014-09-27 LAB — BASIC METABOLIC PANEL
ANION GAP: 12 (ref 5–15)
BUN: 15 mg/dL (ref 6–23)
CO2: 23 mmol/L (ref 19–32)
CREATININE: 1.22 mg/dL (ref 0.50–1.35)
Calcium: 8.8 mg/dL (ref 8.4–10.5)
Chloride: 103 mmol/L (ref 96–112)
GFR calc non Af Amer: 63 mL/min — ABNORMAL LOW (ref >90)
GFR, EST AFRICAN AMERICAN: 73 mL/min — AB (ref >90)
Glucose, Bld: 96 mg/dL (ref 70–99)
Potassium: 3.6 mmol/L (ref 3.5–5.1)
SODIUM: 138 mmol/L (ref 135–145)

## 2014-09-27 LAB — HEPARIN LEVEL (UNFRACTIONATED): Heparin Unfractionated: 0.6 IU/mL (ref 0.30–0.70)

## 2014-09-27 SURGERY — LEFT AND RIGHT HEART CATHETERIZATION WITH CORONARY ANGIOGRAM

## 2014-09-27 MED ORDER — HEPARIN SODIUM (PORCINE) 1000 UNIT/ML IJ SOLN
INTRAMUSCULAR | Status: AC
  Filled 2014-09-27: qty 1

## 2014-09-27 MED ORDER — PERFLUTREN LIPID MICROSPHERE
1.0000 mL | INTRAVENOUS | Status: AC | PRN
  Administered 2014-09-27: 2 mL via INTRAVENOUS
  Filled 2014-09-27: qty 10

## 2014-09-27 MED ORDER — SODIUM CHLORIDE 0.9 % IV SOLN
INTRAVENOUS | Status: DC
  Administered 2014-09-27: 12:00:00 via INTRAVENOUS

## 2014-09-27 MED ORDER — SODIUM CHLORIDE 0.9 % IJ SOLN
3.0000 mL | Freq: Two times a day (BID) | INTRAMUSCULAR | Status: DC
  Administered 2014-09-27: 3 mL via INTRAVENOUS

## 2014-09-27 MED ORDER — HEPARIN SODIUM (PORCINE) 5000 UNIT/ML IJ SOLN
5000.0000 [IU] | Freq: Three times a day (TID) | INTRAMUSCULAR | Status: DC
  Administered 2014-09-27 – 2014-09-28 (×2): 5000 [IU] via SUBCUTANEOUS
  Filled 2014-09-27 (×2): qty 1

## 2014-09-27 MED ORDER — MIDAZOLAM HCL 2 MG/2ML IJ SOLN
INTRAMUSCULAR | Status: AC
  Filled 2014-09-27: qty 2

## 2014-09-27 MED ORDER — HEPARIN (PORCINE) IN NACL 2-0.9 UNIT/ML-% IJ SOLN
INTRAMUSCULAR | Status: AC
  Filled 2014-09-27: qty 1000

## 2014-09-27 MED ORDER — SODIUM CHLORIDE 0.9 % IV SOLN
INTRAVENOUS | Status: AC
  Administered 2014-09-27: 15:00:00 via INTRAVENOUS

## 2014-09-27 MED ORDER — SODIUM CHLORIDE 0.9 % IV SOLN: 250.0000 mL | INTRAVENOUS | Status: DC | PRN

## 2014-09-27 MED ORDER — FENTANYL CITRATE 0.05 MG/ML IJ SOLN
INTRAMUSCULAR | Status: AC
  Filled 2014-09-27: qty 2

## 2014-09-27 MED ORDER — NITROGLYCERIN 1 MG/10 ML FOR IR/CATH LAB
INTRA_ARTERIAL | Status: AC
  Filled 2014-09-27: qty 10

## 2014-09-27 MED ORDER — VERAPAMIL HCL 2.5 MG/ML IV SOLN
INTRAVENOUS | Status: AC
  Filled 2014-09-27: qty 2

## 2014-09-27 MED ORDER — LIDOCAINE HCL (PF) 1 % IJ SOLN
INTRAMUSCULAR | Status: AC
  Filled 2014-09-27: qty 30

## 2014-09-27 MED ORDER — SODIUM CHLORIDE 0.9 % IJ SOLN: 3.0000 mL | INTRAMUSCULAR | Status: DC | PRN

## 2014-09-27 MED ORDER — OXYCODONE-ACETAMINOPHEN 5-325 MG PO TABS: 1.0000 | ORAL_TABLET | ORAL | Status: DC | PRN

## 2014-09-27 NOTE — Interval H&P Note (Signed)
Cath Lab Visit (complete for each Cath Lab visit)  Clinical Evaluation Leading to the Procedure:   ACS: No.  Non-ACS:    Anginal Classification: CCS III  Anti-ischemic medical therapy: Minimal Therapy (1 class of medications)  Non-Invasive Test Results: No non-invasive testing performed  Prior CABG: No previous CABG      History and Physical Interval Note:  09/27/2014 11:45 AM  Matthew Guerrero  has presented today for surgery, with the diagnosis of hf  The various methods of treatment have been discussed with the patient and family. After consideration of risks, benefits and other options for treatment, the patient has consented to  Procedure(s): LEFT AND RIGHT HEART CATHETERIZATION WITH CORONARY ANGIOGRAM (N/A) as a surgical intervention .  The patient's history has been reviewed, patient examined, no change in status, stable for surgery.  I have reviewed the patient's chart and labs.  Questions were answered to the patient's satisfaction.     Sinclair Grooms

## 2014-09-27 NOTE — Progress Notes (Signed)
*  PRELIMINARY RESULTS* Echocardiogram 2D Echocardiogram with contrast has been performed.  Leavy Cella 09/27/2014, 11:47 AM

## 2014-09-27 NOTE — Progress Notes (Signed)
ANTICOAGULATION CONSULT NOTE - Follow Up Consult  Pharmacy Consult for heparin Indication: chest pain/ACS  No Known Allergies  Patient Measurements: Height: 5\' 10"  (177.8 cm) Weight: 200 lb 1.6 oz (90.765 kg) IBW/kg (Calculated) : 73 Heparin Dosing Weight: 93kg  Vital Signs: Temp: 97.6 F (36.4 C) (04/07 0515) Temp Source: Oral (04/07 0515) BP: 105/73 mmHg (04/07 0515) Pulse Rate: 79 (04/07 0515)  Labs:  Recent Labs  09/25/14 1315 09/25/14 2100 09/26/14 0305 09/26/14 0353 09/26/14 0900 09/26/14 1319 09/27/14 0330  HGB 16.7  --   --   --  17.3*  --  16.9  HCT 51.0  --   --   --  53.1*  --  50.8  PLT 119*  --   --   --  127*  --  122*  HEPARINUNFRC  --   --   --  0.68  --  0.42 0.60  CREATININE 1.37*  --  1.34  --   --   --  1.22  TROPONINI  --  0.14* 0.15*  --  0.16*  --   --     Estimated Creatinine Clearance: 73 mL/min (by C-G formula based on Cr of 1.22).   Medications:  Scheduled:  . aspirin EC  81 mg Oral Daily  . benazepril  40 mg Oral Daily  . carvedilol  25 mg Oral BID WC  . colchicine  0.6 mg Oral Daily  . furosemide  40 mg Intravenous BID  . sodium chloride  3 mL Intravenous Q12H  . sodium chloride  3 mL Intravenous Q12H   Infusions:  . sodium chloride    . heparin 1,250 Units/hr (09/26/14 1447)    Assessment: 68 YOM with progressive and worsening SOB over the past couple of weeks with new CP on exertion. He is on heparin at 1250 units/hr and heparin level is confirmed at goal (HL= 0.6). Patient is noted for cath today  Goal of Therapy:  Heparin level 0.3-0.7 units/ml Monitor platelets by anticoagulation protocol: Yes   Plan:  -No heparin changes needed -Daily heparin level and CBC -Will follow plans post cath  Hildred Laser, Pharm D 09/27/2014 10:48 AM

## 2014-09-27 NOTE — H&P (View-Only) (Signed)
DAILY PROGRESS NOTE  Subjective:  Feeling better today. Weight is 206 lbs today. BNP is elevated at 1293. Troponin has remained flat around 0.14-0.15. Awaiting 2D echo today, but will continue to treat for CHF. No further chest pain.  Objective:  Temp:  [97.5 F (36.4 C)-98.2 F (36.8 C)] 98.2 F (36.8 C) (04/06 0544) Pulse Rate:  [48-105] 87 (04/06 0544) Resp:  [16-32] 18 (04/06 0544) BP: (120-151)/(84-112) 120/90 mmHg (04/06 0544) SpO2:  [94 %-100 %] 98 % (04/06 0544) Weight:  [206 lb (93.441 kg)] 206 lb (93.441 kg) (04/05 2027) Weight change:   Intake/Output from previous day:    Intake/Output from this shift:    Medications: Current Facility-Administered Medications  Medication Dose Route Frequency Provider Last Rate Last Dose  . 0.9 %  sodium chloride infusion  250 mL Intravenous PRN Rogelia Mire, NP      . acetaminophen (TYLENOL) tablet 650 mg  650 mg Oral Q4H PRN Rogelia Mire, NP      . aspirin EC tablet 81 mg  81 mg Oral Daily Rogelia Mire, NP   81 mg at 09/26/14 0900  . benazepril (LOTENSIN) tablet 40 mg  40 mg Oral Daily Rogelia Mire, NP      . carvedilol (COREG) tablet 25 mg  25 mg Oral BID WC Rogelia Mire, NP   25 mg at 09/26/14 0900  . colchicine tablet 0.6 mg  0.6 mg Oral Daily Rogelia Mire, NP   0.6 mg at 09/26/14 0900  . furosemide (LASIX) injection 40 mg  40 mg Intravenous BID Rogelia Mire, NP   40 mg at 09/26/14 0900  . heparin ADULT infusion 100 units/mL (25000 units/250 mL)  1,250 Units/hr Intravenous Continuous Rolla Flatten, RPH 12.5 mL/hr at 09/25/14 2120 1,250 Units/hr at 09/25/14 2120  . ondansetron (ZOFRAN) injection 4 mg  4 mg Intravenous Q6H PRN Rogelia Mire, NP   4 mg at 09/25/14 1959  . sodium chloride 0.9 % injection 3 mL  3 mL Intravenous Q12H Rogelia Mire, NP   3 mL at 09/26/14 0900  . sodium chloride 0.9 % injection 3 mL  3 mL Intravenous PRN Rogelia Mire, NP      .  traMADol Veatrice Bourbon) tablet 50 mg  50 mg Oral Q8H PRN Rogelia Mire, NP        Physical Exam: General appearance: alert and no distress Neck: JVD - 4 cm above sternal notch and no carotid bruit Lungs: clear to auscultation bilaterally Heart: regular rate and rhythm, S1, S2 normal, S3 present, systolic murmur: early systolic 3/6, blowing at apex and diastolic murmur: early diastolic 2/6, crescendo at 2nd right intercostal space Abdomen: soft, non-tender; bowel sounds normal; no masses,  no organomegaly Extremities: extremities normal, atraumatic, no cyanosis or edema Pulses: 2+ and symmetric Skin: Skin color, texture, turgor normal. No rashes or lesions Neurologic: Grossly normal  Lab Results: Results for orders placed or performed during the hospital encounter of 09/25/14 (from the past 48 hour(s))  CBC     Status: Abnormal   Collection Time: 09/25/14  1:15 PM  Result Value Ref Range   WBC 7.4 4.0 - 10.5 K/uL   RBC 5.88 (H) 4.22 - 5.81 MIL/uL   Hemoglobin 16.7 13.0 - 17.0 g/dL   HCT 51.0 39.0 - 52.0 %   MCV 86.7 78.0 - 100.0 fL   MCH 28.4 26.0 - 34.0 pg   MCHC 32.7 30.0 - 36.0 g/dL  RDW 15.5 11.5 - 15.5 %   Platelets 119 (L) 150 - 400 K/uL    Comment: REPEATED TO VERIFY SPECIMEN CHECKED FOR CLOTS PLATELET COUNT CONFIRMED BY SMEAR   Basic metabolic panel     Status: Abnormal   Collection Time: 09/25/14  1:15 PM  Result Value Ref Range   Sodium 140 135 - 145 mmol/L   Potassium 4.3 3.5 - 5.1 mmol/L   Chloride 107 96 - 112 mmol/L   CO2 22 19 - 32 mmol/L   Glucose, Bld 115 (H) 70 - 99 mg/dL   BUN 19 6 - 23 mg/dL   Creatinine, Ser 1.37 (H) 0.50 - 1.35 mg/dL   Calcium 8.9 8.4 - 10.5 mg/dL   GFR calc non Af Amer 55 (L) >90 mL/min   GFR calc Af Amer 63 (L) >90 mL/min    Comment: (NOTE) The eGFR has been calculated using the CKD EPI equation. This calculation has not been validated in all clinical situations. eGFR's persistently <90 mL/min signify possible Chronic  Kidney Disease.    Anion gap 11 5 - 15  Hepatic function panel     Status: Abnormal   Collection Time: 09/25/14  1:15 PM  Result Value Ref Range   Total Protein 6.2 6.0 - 8.3 g/dL   Albumin 3.8 3.5 - 5.2 g/dL   AST 29 0 - 37 U/L   ALT 35 0 - 53 U/L   Alkaline Phosphatase 38 (L) 39 - 117 U/L   Total Bilirubin 1.1 0.3 - 1.2 mg/dL   Bilirubin, Direct 0.2 0.0 - 0.5 mg/dL   Indirect Bilirubin 0.9 0.3 - 0.9 mg/dL  I-stat troponin, ED (not at [facility])     Status: Abnormal   Collection Time: 09/25/14  1:19 PM  Result Value Ref Range   Troponin i, poc 0.10 (HH) 0.00 - 0.08 ng/mL   Comment NOTIFIED PHYSICIAN    Comment 3            Comment: Due to the release kinetics of cTnI, a negative result within the first hours of the onset of symptoms does not rule out myocardial infarction with certainty. If myocardial infarction is still suspected, repeat the test at appropriate intervals.   TSH     Status: None   Collection Time: 09/25/14  9:00 PM  Result Value Ref Range   TSH 2.242 0.350 - 4.500 uIU/mL  Troponin I     Status: Abnormal   Collection Time: 09/25/14  9:00 PM  Result Value Ref Range   Troponin I 0.14 (H) <0.031 ng/mL    Comment:        PERSISTENTLY INCREASED TROPONIN VALUES IN THE RANGE OF 0.04-0.49 ng/mL CAN BE SEEN IN:       -UNSTABLE ANGINA       -CONGESTIVE HEART FAILURE       -MYOCARDITIS       -CHEST TRAUMA       -ARRYHTHMIAS       -LATE PRESENTING MYOCARDIAL INFARCTION       -COPD   CLINICAL FOLLOW-UP RECOMMENDED.   Comprehensive metabolic panel     Status: Abnormal   Collection Time: 09/26/14  3:05 AM  Result Value Ref Range   Sodium 142 135 - 145 mmol/L   Potassium 4.7 3.5 - 5.1 mmol/L   Chloride 107 96 - 112 mmol/L   CO2 27 19 - 32 mmol/L   Glucose, Bld 107 (H) 70 - 99 mg/dL   BUN 14 6 - 23 mg/dL  Creatinine, Ser 1.34 0.50 - 1.35 mg/dL   Calcium 9.2 8.4 - 10.5 mg/dL   Total Protein 5.2 (L) 6.0 - 8.3 g/dL   Albumin 3.2 (L) 3.5 - 5.2 g/dL   AST 26 0 - 37  U/L   ALT 33 0 - 53 U/L   Alkaline Phosphatase 36 (L) 39 - 117 U/L   Total Bilirubin 1.2 0.3 - 1.2 mg/dL   GFR calc non Af Amer 56 (L) >90 mL/min   GFR calc Af Amer 65 (L) >90 mL/min    Comment: (NOTE) The eGFR has been calculated using the CKD EPI equation. This calculation has not been validated in all clinical situations. eGFR's persistently <90 mL/min signify possible Chronic Kidney Disease.    Anion gap 8 5 - 15  Magnesium     Status: None   Collection Time: 09/26/14  3:05 AM  Result Value Ref Range   Magnesium 1.9 1.5 - 2.5 mg/dL  Troponin I     Status: Abnormal   Collection Time: 09/26/14  3:05 AM  Result Value Ref Range   Troponin I 0.15 (H) <0.031 ng/mL    Comment:        PERSISTENTLY INCREASED TROPONIN VALUES IN THE RANGE OF 0.04-0.49 ng/mL CAN BE SEEN IN:       -UNSTABLE ANGINA       -CONGESTIVE HEART FAILURE       -MYOCARDITIS       -CHEST TRAUMA       -ARRYHTHMIAS       -LATE PRESENTING MYOCARDIAL INFARCTION       -COPD   CLINICAL FOLLOW-UP RECOMMENDED.   Brain natriuretic peptide     Status: Abnormal   Collection Time: 09/26/14  3:53 AM  Result Value Ref Range   B Natriuretic Peptide 1293.3 (H) 0.0 - 100.0 pg/mL  Heparin level (unfractionated)     Status: None   Collection Time: 09/26/14  3:53 AM  Result Value Ref Range   Heparin Unfractionated 0.68 0.30 - 0.70 IU/mL    Comment:        IF HEPARIN RESULTS ARE BELOW EXPECTED VALUES, AND PATIENT DOSAGE HAS BEEN CONFIRMED, SUGGEST FOLLOW UP TESTING OF ANTITHROMBIN III LEVELS.     Imaging: Dg Chest 2 View  09/25/2014   CLINICAL DATA:  Abdominal discomfort since 09/11/2014, shortness of breath, suspected CHF, pain, history hypertension, asthma  EXAM: CHEST  2 VIEW  COMPARISON:  09/11/2014  FINDINGS: Enlargement of cardiac silhouette with pulmonary vascular congestion.  Mediastinal contours normal.  Lungs clear.  No pulmonary infiltrate, pleural effusion or pneumothorax.  Osseous structures unremarkable.   IMPRESSION: Enlargement of cardiac silhouette with pulmonary vascular congestion.  No acute infiltrate or pulmonary edema identified.   Electronically Signed   By: Lavonia Dana M.D.   On: 09/25/2014 15:06    Assessment:  1. Principal Problem: 2.   Congestive heart failure 3. Active Problems: 4.   Essential hypertension 5.   Hyperlipidemia 6.   Abnormal ECG 7.   Chest pain 8.   Elevated troponin 9.   Acute CHF 10.   Plan:  1. Breathing has improved with diuresis - will need to follow I's/O's. Await 2D echo today - suspect cardiomyopathy, probably dilated with valvular disease. Will likely need R/LHC, possibly tomorrow depending on diuresis - he can lay flat comfortably now.  Continue heparin until coronary status is known - Suspect troponin due to CHF, however, family history of CAD with father that had CABG at the same age.  Time Spent Directly with Patient:  15 minutes  Length of Stay:  LOS: 1 day   Pixie Casino, MD, [facility] Attending Cardiologist [facility]  Ruston Fedora C 09/26/2014, 9:16 AM

## 2014-09-27 NOTE — CV Procedure (Signed)
     Left and Right Heart Catheterization with Coronary Angiography  Report  Matthew Guerrero  59 y.o.  male Sep 12, 1955  Procedure Date: 09/27/2014 Referring Physician: Debara Pickett, M.D. Primary Cardiologist:: Debara Pickett, M.D.  INDICATIONS: Unexplained left heart failure  PROCEDURE: 1. Left heart catheterization; 2. Coronary angiography; 3. Left ventriculography; 4. Right heart catheterization  CONSENT:  The risks, benefits, and details of the procedure were explained in detail to the patient. Risks including death, stroke, heart attack, kidney injury, allergy, limb ischemia, bleeding and radiation injury were discussed.  The patient verbalized understanding and wanted to proceed.  Informed written consent was obtained.  PROCEDURE TECHNIQUE:  After Xylocaine anesthesia, right heart catheterization was performed from the right antecubital vein in exchange for an 18-gauge Angiocath. Double glove technique was used to and habits sterility. A 5 French brachial sheath was placed using the Seldinger technique. A 5 French balloon tipped catheter was then used for hemodynamic recordings and pulmonary artery oximetry sampling. After Xylocaine anesthesia, a 5 French Slender sheath was placed in the right radial artery with an angiocath and the modified Seldinger technique.  Coronary angiography was done using a 5 F JR4 and JL 3.5 cm diagnostic catheters.  Left ventriculography was done using the JR 4 catheter and hand injection.   Digital images were reviewed and the case was terminated.  Hemostasis was achieved with a radial wrist band.   CONTRAST:  Total of 70 cc.  COMPLICATIONS:  None   HEMODYNAMICS:  Aortic pressure 98/70 mmHg; LV pressure 100/7 mmHg; LVEDP 17 mmHg; RA 2 mmHg; RV 37/5 mmHg; PA 38/19 mmHg; PCWP(mean) 13 mmHg ; Cardiac Output 4.48 L/m; AV gradient 2 mmHg  ANGIOGRAPHIC DATA:   The left main coronary artery is normal.  The left anterior descending artery is normal.  The left circumflex  artery is normal.  The right coronary artery is dominant and normal.   LEFT VENTRICULOGRAM:  Left ventricular angiogram was done in the 30 RAO projection and revealed markedly dilated and globally hypokinetic with an EF of 15%.   IMPRESSIONS:  1. Normal coronary arteries 2. Dilated LV with severe reduction in systolic function, estimated EF 15%.   RECOMMENDATION:  Medical therapy for systolic heart failure.[name]

## 2014-09-27 NOTE — Progress Notes (Signed)
Echo briefly reviewed at bedside - EF around 20% or less - possible apical thrombus vs. Calcified false tendon. Will administer definity contrast. Plan still for [facility] today to determine etiology of cardiomyopathy.  Pixie Casino, MD, [facility] Attending Cardiologist [place]

## 2014-09-27 NOTE — Telephone Encounter (Signed)
Received [employer] disability form (Attending Physician Statement) for Dr Debara Pickett to review and sign.  Sent form to [employer] [facility] [employer] to process -needs packet for ROI and pmt.  Sent to [employer] [facility] [employer] 09/27/14. lp

## 2014-09-27 NOTE — Progress Notes (Signed)
DAILY PROGRESS NOTE  Subjective:  Diuresed about 2L - weight down 5 lbs. Breathing is better. Creatinine is improved. Echo not yet performed.  Objective:  Temp:  [97.6 F (36.4 C)-98.9 F (37.2 C)] 97.6 F (36.4 C) (04/07 0515) Pulse Rate:  [79-82] 79 (04/07 0515) Resp:  [16-20] 18 (04/07 0515) BP: (105-119)/(73-81) 105/73 mmHg (04/07 0515) SpO2:  [95 %-96 %] 96 % (04/07 0515) Weight:  [200 lb 1.6 oz (90.765 kg)] 200 lb 1.6 oz (90.765 kg) (04/07 0515) Weight change: -5 lb 14.4 oz (-2.676 kg)  Intake/Output from previous day: 04/06 0701 - 04/07 0700 In: 985.8 [P.O.:480; I.V.:505.8] Out: 3150 [Urine:3150]  Intake/Output from this shift: Total I/O In: -  Out: 500 [Urine:500]  Medications: Current Facility-Administered Medications  Medication Dose Route Frequency Provider Last Rate Last Dose  . 0.9 %  sodium chloride infusion  250 mL Intravenous PRN Rogelia Mire, NP      . acetaminophen (TYLENOL) tablet 650 mg  650 mg Oral Q4H PRN Rogelia Mire, NP      . aspirin EC tablet 81 mg  81 mg Oral Daily Rogelia Mire, NP   81 mg at 09/27/14 0855  . benazepril (LOTENSIN) tablet 40 mg  40 mg Oral Daily Rogelia Mire, NP   40 mg at 09/26/14 1146  . carvedilol (COREG) tablet 25 mg  25 mg Oral BID WC Rogelia Mire, NP   25 mg at 09/27/14 0855  . colchicine tablet 0.6 mg  0.6 mg Oral Daily Rogelia Mire, NP   0.6 mg at 09/27/14 0854  . furosemide (LASIX) injection 40 mg  40 mg Intravenous BID Rogelia Mire, NP   40 mg at 09/27/14 0855  . heparin ADULT infusion 100 units/mL (25000 units/250 mL)  1,250 Units/hr Intravenous Continuous Rolla Flatten, RPH 12.5 mL/hr at 09/26/14 1447 1,250 Units/hr at 09/26/14 1447  . ondansetron (ZOFRAN) injection 4 mg  4 mg Intravenous Q6H PRN Rogelia Mire, NP   4 mg at 09/25/14 1959  . sodium chloride 0.9 % injection 3 mL  3 mL Intravenous Q12H Rogelia Mire, NP   3 mL at 09/27/14 0855  . sodium  chloride 0.9 % injection 3 mL  3 mL Intravenous PRN Rogelia Mire, NP      . traMADol Veatrice Bourbon) tablet 50 mg  50 mg Oral Q8H PRN Rogelia Mire, NP        Physical Exam: General appearance: alert and no distress Neck: JVD - 4 cm above sternal notch and no carotid bruit Lungs: clear to auscultation bilaterally Heart: regular rate and rhythm, S1, S2 normal, S3 present, systolic murmur: early systolic 3/6, blowing at apex and diastolic murmur: early diastolic 2/6, crescendo at 2nd right intercostal space Abdomen: soft, non-tender; bowel sounds normal; no masses,  no organomegaly Extremities: extremities normal, atraumatic, no cyanosis or edema Pulses: 2+ and symmetric Skin: Skin color, texture, turgor normal. No rashes or lesions Neurologic: Grossly normal  Lab Results: Results for orders placed or performed during the hospital encounter of 09/25/14 (from the past 48 hour(s))  CBC     Status: Abnormal   Collection Time: 09/25/14  1:15 PM  Result Value Ref Range   WBC 7.4 4.0 - 10.5 K/uL   RBC 5.88 (H) 4.22 - 5.81 MIL/uL   Hemoglobin 16.7 13.0 - 17.0 g/dL   HCT 51.0 39.0 - 52.0 %   MCV 86.7 78.0 - 100.0 fL   MCH 28.4  26.0 - 34.0 pg   MCHC 32.7 30.0 - 36.0 g/dL   RDW 15.5 11.5 - 15.5 %   Platelets 119 (L) 150 - 400 K/uL    Comment: REPEATED TO VERIFY SPECIMEN CHECKED FOR CLOTS PLATELET COUNT CONFIRMED BY SMEAR   Basic metabolic panel     Status: Abnormal   Collection Time: 09/25/14  1:15 PM  Result Value Ref Range   Sodium 140 135 - 145 mmol/L   Potassium 4.3 3.5 - 5.1 mmol/L   Chloride 107 96 - 112 mmol/L   CO2 22 19 - 32 mmol/L   Glucose, Bld 115 (H) 70 - 99 mg/dL   BUN 19 6 - 23 mg/dL   Creatinine, Ser 1.37 (H) 0.50 - 1.35 mg/dL   Calcium 8.9 8.4 - 10.5 mg/dL   GFR calc non Af Amer 55 (L) >90 mL/min   GFR calc Af Amer 63 (L) >90 mL/min    Comment: (NOTE) The eGFR has been calculated using the CKD EPI equation. This calculation has not been validated in all  clinical situations. eGFR's persistently <90 mL/min signify possible Chronic Kidney Disease.    Anion gap 11 5 - 15  Hepatic function panel     Status: Abnormal   Collection Time: 09/25/14  1:15 PM  Result Value Ref Range   Total Protein 6.2 6.0 - 8.3 g/dL   Albumin 3.8 3.5 - 5.2 g/dL   AST 29 0 - 37 U/L   ALT 35 0 - 53 U/L   Alkaline Phosphatase 38 (L) 39 - 117 U/L   Total Bilirubin 1.1 0.3 - 1.2 mg/dL   Bilirubin, Direct 0.2 0.0 - 0.5 mg/dL   Indirect Bilirubin 0.9 0.3 - 0.9 mg/dL  I-stat troponin, ED (not at [facility])     Status: Abnormal   Collection Time: 09/25/14  1:19 PM  Result Value Ref Range   Troponin i, poc 0.10 (HH) 0.00 - 0.08 ng/mL   Comment NOTIFIED PHYSICIAN    Comment 3            Comment: Due to the release kinetics of cTnI, a negative result within the first hours of the onset of symptoms does not rule out myocardial infarction with certainty. If myocardial infarction is still suspected, repeat the test at appropriate intervals.   TSH     Status: None   Collection Time: 09/25/14  9:00 PM  Result Value Ref Range   TSH 2.242 0.350 - 4.500 uIU/mL  Troponin I     Status: Abnormal   Collection Time: 09/25/14  9:00 PM  Result Value Ref Range   Troponin I 0.14 (H) <0.031 ng/mL    Comment:        PERSISTENTLY INCREASED TROPONIN VALUES IN THE RANGE OF 0.04-0.49 ng/mL CAN BE SEEN IN:       -UNSTABLE ANGINA       -CONGESTIVE HEART FAILURE       -MYOCARDITIS       -CHEST TRAUMA       -ARRYHTHMIAS       -LATE PRESENTING MYOCARDIAL INFARCTION       -COPD   CLINICAL FOLLOW-UP RECOMMENDED.   Comprehensive metabolic panel     Status: Abnormal   Collection Time: 09/26/14  3:05 AM  Result Value Ref Range   Sodium 142 135 - 145 mmol/L   Potassium 4.7 3.5 - 5.1 mmol/L   Chloride 107 96 - 112 mmol/L   CO2 27 19 - 32 mmol/L   Glucose, Bld 107 (  H) 70 - 99 mg/dL   BUN 14 6 - 23 mg/dL   Creatinine, Ser 1.34 0.50 - 1.35 mg/dL   Calcium 9.2 8.4 - 10.5 mg/dL   Total  Protein 5.2 (L) 6.0 - 8.3 g/dL   Albumin 3.2 (L) 3.5 - 5.2 g/dL   AST 26 0 - 37 U/L   ALT 33 0 - 53 U/L   Alkaline Phosphatase 36 (L) 39 - 117 U/L   Total Bilirubin 1.2 0.3 - 1.2 mg/dL   GFR calc non Af Amer 56 (L) >90 mL/min   GFR calc Af Amer 65 (L) >90 mL/min    Comment: (NOTE) The eGFR has been calculated using the CKD EPI equation. This calculation has not been validated in all clinical situations. eGFR's persistently <90 mL/min signify possible Chronic Kidney Disease.    Anion gap 8 5 - 15  Magnesium     Status: None   Collection Time: 09/26/14  3:05 AM  Result Value Ref Range   Magnesium 1.9 1.5 - 2.5 mg/dL  Troponin I     Status: Abnormal   Collection Time: 09/26/14  3:05 AM  Result Value Ref Range   Troponin I 0.15 (H) <0.031 ng/mL    Comment:        PERSISTENTLY INCREASED TROPONIN VALUES IN THE RANGE OF 0.04-0.49 ng/mL CAN BE SEEN IN:       -UNSTABLE ANGINA       -CONGESTIVE HEART FAILURE       -MYOCARDITIS       -CHEST TRAUMA       -ARRYHTHMIAS       -LATE PRESENTING MYOCARDIAL INFARCTION       -COPD   CLINICAL FOLLOW-UP RECOMMENDED.   Brain natriuretic peptide     Status: Abnormal   Collection Time: 09/26/14  3:53 AM  Result Value Ref Range   B Natriuretic Peptide 1293.3 (H) 0.0 - 100.0 pg/mL  Heparin level (unfractionated)     Status: None   Collection Time: 09/26/14  3:53 AM  Result Value Ref Range   Heparin Unfractionated 0.68 0.30 - 0.70 IU/mL    Comment:        IF HEPARIN RESULTS ARE BELOW EXPECTED VALUES, AND PATIENT DOSAGE HAS BEEN CONFIRMED, SUGGEST FOLLOW UP TESTING OF ANTITHROMBIN III LEVELS.   Troponin I     Status: Abnormal   Collection Time: 09/26/14  9:00 AM  Result Value Ref Range   Troponin I 0.16 (H) <0.031 ng/mL    Comment:        PERSISTENTLY INCREASED TROPONIN VALUES IN THE RANGE OF 0.04-0.49 ng/mL CAN BE SEEN IN:       -UNSTABLE ANGINA       -CONGESTIVE HEART FAILURE       -MYOCARDITIS       -CHEST TRAUMA        -ARRYHTHMIAS       -LATE PRESENTING MYOCARDIAL INFARCTION       -COPD   CLINICAL FOLLOW-UP RECOMMENDED.   CBC     Status: Abnormal   Collection Time: 09/26/14  9:00 AM  Result Value Ref Range   WBC 10.3 4.0 - 10.5 K/uL   RBC 6.13 (H) 4.22 - 5.81 MIL/uL   Hemoglobin 17.3 (H) 13.0 - 17.0 g/dL   HCT 53.1 (H) 39.0 - 52.0 %   MCV 86.6 78.0 - 100.0 fL   MCH 28.2 26.0 - 34.0 pg   MCHC 32.6 30.0 - 36.0 g/dL   RDW 15.7 (H) 11.5 - 15.5 %  Platelets 127 (L) 150 - 400 K/uL  Heparin level (unfractionated)     Status: None   Collection Time: 09/26/14  1:19 PM  Result Value Ref Range   Heparin Unfractionated 0.42 0.30 - 0.70 IU/mL    Comment:        IF HEPARIN RESULTS ARE BELOW EXPECTED VALUES, AND PATIENT DOSAGE HAS BEEN CONFIRMED, SUGGEST FOLLOW UP TESTING OF ANTITHROMBIN III LEVELS.   Basic metabolic panel     Status: Abnormal   Collection Time: 09/27/14  3:30 AM  Result Value Ref Range   Sodium 138 135 - 145 mmol/L   Potassium 3.6 3.5 - 5.1 mmol/L   Chloride 103 96 - 112 mmol/L   CO2 23 19 - 32 mmol/L   Glucose, Bld 96 70 - 99 mg/dL   BUN 15 6 - 23 mg/dL   Creatinine, Ser 1.22 0.50 - 1.35 mg/dL   Calcium 8.8 8.4 - 10.5 mg/dL   GFR calc non Af Amer 63 (L) >90 mL/min   GFR calc Af Amer 73 (L) >90 mL/min    Comment: (NOTE) The eGFR has been calculated using the CKD EPI equation. This calculation has not been validated in all clinical situations. eGFR's persistently <90 mL/min signify possible Chronic Kidney Disease.    Anion gap 12 5 - 15  Heparin level (unfractionated)     Status: None   Collection Time: 09/27/14  3:30 AM  Result Value Ref Range   Heparin Unfractionated 0.60 0.30 - 0.70 IU/mL    Comment:        IF HEPARIN RESULTS ARE BELOW EXPECTED VALUES, AND PATIENT DOSAGE HAS BEEN CONFIRMED, SUGGEST FOLLOW UP TESTING OF ANTITHROMBIN III LEVELS.   CBC     Status: Abnormal   Collection Time: 09/27/14  3:30 AM  Result Value Ref Range   WBC 8.6 4.0 - 10.5 K/uL   RBC  5.91 (H) 4.22 - 5.81 MIL/uL   Hemoglobin 16.9 13.0 - 17.0 g/dL   HCT 50.8 39.0 - 52.0 %   MCV 86.0 78.0 - 100.0 fL   MCH 28.6 26.0 - 34.0 pg   MCHC 33.3 30.0 - 36.0 g/dL   RDW 15.7 (H) 11.5 - 15.5 %   Platelets 122 (L) 150 - 400 K/uL    Imaging: Dg Chest 2 View  09/25/2014   CLINICAL DATA:  Abdominal discomfort since 09/11/2014, shortness of breath, suspected CHF, pain, history hypertension, asthma  EXAM: CHEST  2 VIEW  COMPARISON:  09/11/2014  FINDINGS: Enlargement of cardiac silhouette with pulmonary vascular congestion.  Mediastinal contours normal.  Lungs clear.  No pulmonary infiltrate, pleural effusion or pneumothorax.  Osseous structures unremarkable.  IMPRESSION: Enlargement of cardiac silhouette with pulmonary vascular congestion.  No acute infiltrate or pulmonary edema identified.   Electronically Signed   By: Lavonia Dana M.D.   On: 09/25/2014 15:06    Assessment:  Principal Problem:   Congestive heart failure Active Problems:   Essential hypertension   Hyperlipidemia   Abnormal ECG   Chest pain   Elevated troponin   Acute CHF   Plan:  Will plan R/LHC today to evaluate for causes of cardiomyopathy. EF fast scan showed hypokinesis - formal echo is pending. Will try to obtain STAT this am. Arrange for [facility] at 12:30 with Dr. Tamala Julian today. Discussed risk/benefits of cardiac cath with him and he is agreeable.  Time Spent Directly with Patient:  15 minutes  Length of Stay:  LOS: 2 days   Pixie Casino, MD, [facility]  Attending Cardiologist [facility]  HILTY,Kenneth C 09/27/2014, 9:57 AM

## 2014-09-27 NOTE — Progress Notes (Signed)
Utilization review completed. Shakiara Lukic, RN, BSN. 

## 2014-09-28 ENCOUNTER — Telehealth: Payer: Self-pay | Admitting: Internal Medicine

## 2014-09-28 ENCOUNTER — Encounter (HOSPITAL_COMMUNITY): Payer: Self-pay | Admitting: Interventional Cardiology

## 2014-09-28 ENCOUNTER — Telehealth: Payer: Self-pay | Admitting: Hematology and Oncology

## 2014-09-28 DIAGNOSIS — I428 Other cardiomyopathies: Secondary | ICD-10-CM

## 2014-09-28 DIAGNOSIS — R7989 Other specified abnormal findings of blood chemistry: Secondary | ICD-10-CM

## 2014-09-28 DIAGNOSIS — Z9889 Other specified postprocedural states: Secondary | ICD-10-CM

## 2014-09-28 DIAGNOSIS — D751 Secondary polycythemia: Secondary | ICD-10-CM

## 2014-09-28 DIAGNOSIS — I5021 Acute systolic (congestive) heart failure: Principal | ICD-10-CM

## 2014-09-28 DIAGNOSIS — I429 Cardiomyopathy, unspecified: Secondary | ICD-10-CM

## 2014-09-28 HISTORY — DX: Other specified postprocedural states: Z98.890

## 2014-09-28 HISTORY — DX: Other cardiomyopathies: I42.8

## 2014-09-28 LAB — BASIC METABOLIC PANEL
Anion gap: 6 (ref 5–15)
BUN: 14 mg/dL (ref 6–23)
CO2: 30 mmol/L (ref 19–32)
CREATININE: 1.32 mg/dL (ref 0.50–1.35)
Calcium: 9.1 mg/dL (ref 8.4–10.5)
Chloride: 104 mmol/L (ref 96–112)
GFR calc Af Amer: 66 mL/min — ABNORMAL LOW (ref >90)
GFR, EST NON AFRICAN AMERICAN: 57 mL/min — AB (ref >90)
Glucose, Bld: 119 mg/dL — ABNORMAL HIGH (ref 70–99)
Potassium: 3.8 mmol/L (ref 3.5–5.1)
Sodium: 140 mmol/L (ref 135–145)

## 2014-09-28 LAB — CBC
HCT: 52.7 % — ABNORMAL HIGH (ref 39.0–52.0)
Hemoglobin: 17.7 g/dL — ABNORMAL HIGH (ref 13.0–17.0)
MCH: 28.6 pg (ref 26.0–34.0)
MCHC: 33.6 g/dL (ref 30.0–36.0)
MCV: 85.3 fL (ref 78.0–100.0)
PLATELETS: 139 10*3/uL — AB (ref 150–400)
RBC: 6.18 MIL/uL — ABNORMAL HIGH (ref 4.22–5.81)
RDW: 15.5 % (ref 11.5–15.5)
WBC: 7.9 10*3/uL (ref 4.0–10.5)

## 2014-09-28 MED ORDER — SACUBITRIL-VALSARTAN 49-51 MG PO TABS: 1.0000 | ORAL_TABLET | Freq: Two times a day (BID) | ORAL | Status: DC

## 2014-09-28 MED ORDER — ASPIRIN 81 MG PO TBEC: 81.0000 mg | DELAYED_RELEASE_TABLET | Freq: Every day | ORAL | Status: DC

## 2014-09-28 MED ORDER — FUROSEMIDE 40 MG PO TABS
40.0000 mg | ORAL_TABLET | Freq: Every day | ORAL | Status: DC
  Administered 2014-09-28: 40 mg via ORAL
  Filled 2014-09-28: qty 1

## 2014-09-28 MED ORDER — FUROSEMIDE 40 MG PO TABS: 40.0000 mg | ORAL_TABLET | Freq: Every day | ORAL | Status: DC

## 2014-09-28 NOTE — Telephone Encounter (Signed)
new patient appt- s/w nurse and gave np appt for 05/02 @ 3:15 w/Dr. Lindi Adie Dx- erythrocytosis and dec'd plts

## 2014-09-28 NOTE — Progress Notes (Signed)
Heart Failure Navigator Consult Note  Presentation: Matthew Guerrero is a 59 y.o. male with a past medical history significant for hypertension, dyslipidemia, gout, asthma and allergic rhinitis. There is a strong family history of coronary artery disease. He was seen by his primary care provider in early March and was doing well without any complaints for a annual physical. Shortly thereafter he became more nauseated and started having shortness of breath. He was seen in the left lower primary care on-call clinic for loose stool, epigastric pain and vomiting. He was felt to have a viral gastroenteritis. At the time he had normal heart sounds and no chest pain or shortness of breath. His symptoms worsened and he was seen 3 days later in the emergency department on 09/11/14. He was again complaining of abdominal pain and vomiting. He did have an EKG which is abnormal demonstrating T-wave changes and LVH. Chest x-ray suggested cardiomegaly and possible effusion. He underwent a fast scan, which was interpreted as no pericardial effusion, however there was global hypokinesis. He was informed there was a "slight chance" that he could have heart failure and further tests could be run however since he had already scheduled cardiology follow-up, the patient wished to follow-up in the office. He was subsequently discharged. Since then he's had progressive shortness of breath and chest pain with exertion. He was found to have an elevated troponin today of 0.1. Chest x-ray shows cardiomegaly with new pulmonary vascular congestion consistent with heart failure. Cardiology was asked to consult regarding his chest pain and shortness of breath  Past Medical History  Diagnosis Date  . ALLERGIC RHINITIS   . GOUT   . HYPERLIPIDEMIA   . HYPERTENSION   . Asthma   . Cervical radiculitis   . Lateral epicondylitis of right elbow   . Hyperglycemia 08/28/2014  . S/P cardiac cath wjith normal coronary arteries  09/28/2014     History   Social History  . Marital Status: Single    Spouse Name: N/A  . Number of Children: N/A  . Years of Education: N/A   Social History Main Topics  . Smoking status: Never Smoker   . Smokeless tobacco: Never Used  . Alcohol Use: No  . Drug Use: No  . Sexual Activity: Not on file   Other Topics Concern  . None   Social History Narrative    ECHO:Study Conclusions--09/27/14  - Left ventricle: The cavity size was severely dilated. Wall thickness was increased in a pattern of mild LVH. Systolic function was severely reduced. The estimated ejection fraction was in the range of 15% to 20%. Diffuse hypokinesis. There is akinesis of the entireanteroseptal myocardium. Doppler parameters are consistent with abnormal left ventricular relaxation (grade 1 diastolic dysfunction). There is muscular trabeculation at apex of left ventricle (no obvious thrombus identified). Definity contrast used. - Mitral valve: There was mild regurgitation. - Left atrium: The atrium was moderately dilated. - Right ventricle: The cavity size was moderately dilated. Wall thickness was normal. - Right atrium: The atrium was mildly dilated.  Cardiac Catheterization-09/25/14 HEMODYNAMICS: Aortic pressure 98/70 mmHg; LV pressure 100/7 mmHg; LVEDP 17 mmHg; RA 2 mmHg; RV 37/5 mmHg; PA 38/19 mmHg; PCWP(mean) 13 mmHg ; Cardiac Output 4.48 L/m; AV gradient 2 mmHg  ANGIOGRAPHIC DATA: The left main coronary artery is normal.  The left anterior descending artery is normal.  The left circumflex artery is normal.  The right coronary artery is dominant and normal.   LEFT VENTRICULOGRAM: Left ventricular angiogram was done in the  30 RAO projection and revealed markedly dilated and globally hypokinetic with an EF of 15%.   IMPRESSIONS: 1. Normal coronary arteries 2. Dilated LV with severe reduction in systolic function, estimated EF 15%.   RECOMMENDATION: Medical therapy for  systolic heart failure.[name]  BNP    Component Value Date/Time   BNP 1293.3* 09/26/2014 0353    ProBNP No results found for: PROBNP   Education Assessment and Provision:  Detailed education and instructions provided on heart failure disease management including the following:  Signs and symptoms of Heart Failure When to call the physician Importance of daily weights Low sodium diet Fluid restriction Medication management Anticipated future follow-up appointments  Patient education given on each of the above topics.  Patient acknowledges understanding and acceptance of all instructions.  I spoke briefly with Matthew Guerrero regarding his new diagnosis of HF.  He seems to have a good basic understanding of HF and HF recommendations.  He does have a scale and says he plans to begin weighing daily.  We discussed a low sodium diet and high sodium foods to avoid.  He also says that getting and taking medications will not be an issue.  He has a follow up appt scheduled at [facility] on 4/15.  Education Materials:  "Living Better With Heart Failure" Booklet, Daily Weight Tracker Tool    High Risk Criteria for Readmission and/or Poor Patient Outcomes:   EF <30% -Yes 15-20%  2 or more admissions in 6 months- Yes  Difficult social situation- No-lives with roommate  Demonstrates medication noncompliance- No    Barriers of Care:   Knowledge of HF--new diagnosis and Compliance    Discharge Planning:   Plans to discharge to home with roommate

## 2014-09-28 NOTE — Care Management Note (Addendum)
    Page 1 of 1   09/28/2014     12:05:51 PM CARE MANAGEMENT NOTE 09/28/2014  Patient:  Matthew Guerrero   Account Number:  [id number]  Date Initiated:  09/28/2014  Documentation initiated by:  GRAVES-BIGELOW,Avianah Pellman  Subjective/Objective Assessment:   Pt admitted for CHF. Pt uses [facility]. CM did call and Delene Loll is available. Pt to activate card.     Action/Plan:   CM has benefits check in process for Entresto. CM will provide pt with 30 day free/co pay card. No further needs from CM at this time.   Anticipated DC Date:  09/28/2014   Anticipated DC Plan:  [place]  CM consult  Medication Assistance      Choice offered to / List presented to:             Status of service:  Completed, signed off Medicare Important Message given?  NO (If response is "NO", the following Medicare IM given date fields will be blank) Date Medicare IM given:   Medicare IM given by:   Date Additional Medicare IM given:   Additional Medicare IM given by:    Discharge Disposition:  HOME/SELF CARE  Per UR Regulation:  Reviewed for med. necessity/level of care/duration of stay  If discussed at [place] of Stay Meetings, dates discussed:    Comments:  PT HAS A COINSURANCE OF [phone]. BUT PT WILL NOT PAY MORE THAN $100 FOR MEDICATION. PA IS REQUIRED-[phone]  OPTION # 1,3,4,1

## 2014-09-28 NOTE — Progress Notes (Signed)
Hear Failure pamphlet given to pt, RN answered pt questions. Pt being discharged with all discharge paperwork. Pt instructed to call the unit if he has any questions about discharge paperwork. Etta Quill, RN

## 2014-09-28 NOTE — Discharge Instructions (Signed)
Call [facility] at [phone] if any bleeding, swelling or drainage at cath site.  May shower, no tub baths for 48 hours for groin sticks. No lifting over 5 pounds for 3 days.  No Driving for 3 days.    Weigh daily today at discharge your weight is 197 lbs.  Call [phone] if weight climbs more than 3 pounds in a day or 5 pounds in a week. No salt to very little salt in your diet.  No more than 2000 mg in a day. Call if increased shortness of breath or increased swelling.   stop benazepril, we increased your lasix, and you will begin Entresto on Sunday the 10th.  Call the office if any increased shortness of breath or as above.   Shortness of Breath Shortness of breath means you have trouble breathing. Shortness of breath needs medical care right away. HOME CARE   Do not smoke.  Avoid being around chemicals or things (paint fumes, dust) that may bother your breathing.  Rest as needed. Slowly begin your normal activities.  Only take medicines as told by your doctor.  Keep all doctor visits as told. GET HELP RIGHT AWAY IF:   Your shortness of breath gets worse.  You feel lightheaded, pass out (faint), or have a cough that is not helped by medicine.  You cough up blood.  You have pain with breathing.  You have pain in your chest, arms, shoulders, or belly (abdomen).  You have a fever.  You cannot walk up stairs or exercise the way you normally do.  You do not get better in the time expected.  You have a hard time doing normal activities even with rest.  You have problems with your medicines.  You have any new symptoms. MAKE SURE YOU:  Understand these instructions.  Will watch your condition.  Will get help right away if you are not doing well or get worse. Document Released: 11/25/2007 Document Revised: 06/13/2013 Document Reviewed: 08/24/2011 [facility] Patient Information 2015 [place], [place]. This information is not intended to replace advice  given to you by your health care provider. Make sure you discuss any questions you have with your health care provider.

## 2014-09-28 NOTE — Telephone Encounter (Signed)
7 day TOC -appt 10-05-14 with Nicki Reaper

## 2014-09-28 NOTE — Discharge Summary (Signed)
Physician Discharge Summary       Patient ID: Matthew Guerrero MRN: [id number] DOB/AGE: 03/18/1956 59 y.o.  Admit date: 09/25/2014 Discharge date: 09/28/2014 Primary Cardiologist: Dr. Debara Pickett      Discharge Diagnoses:  Principal Problem:   Acute systolic congestive heart failure, NYHA class 4 Active Problems:   Nonischemic cardiomyopathy   Essential hypertension   Hyperlipidemia   Abnormal ECG   Chest pain   Elevated troponin   S/P cardiac cath wjith normal coronary arteries    Erythrocytosis and decreased platelet count.   Discharged Condition: good  Procedures: cardiac cath by Dr. Tamala Julian 09/27/14  With normal Coronary arteries and EF 15% dilated LV  Hospital Course: 59 y.o. male with a past medical history significant for hypertension, dyslipidemia, gout, asthma and allergic rhinitis. There is a strong family history of coronary artery disease. He was seen by his primary care provider in early March and was doing well without any complaints for a annual physical. Shortly thereafter he became more nauseated and started having shortness of breath. He was seen in the left lower primary care on-call clinic for loose stool, epigastric pain and vomiting. He was felt to have a viral gastroenteritis. At the time he had normal heart sounds and no chest pain or shortness of breath. His symptoms worsened and he was seen 3 days later in the emergency department on 09/11/14. He was again complaining of abdominal pain and vomiting. He did have an EKG which is abnormal demonstrating T-wave changes and LVH. Chest x-ray suggested cardiomegaly and possible effusion. He underwent an echo, which was interpreted as no pericardial effusion, however there was global hypokinesis. He was informed there was a "slight chance" that he could have heart failure and further tests could be run however since he had already scheduled cardiology follow-up, the patient wished to follow-up in the office. He was subsequently  discharged. He's had progressive shortness of breath and chest pain with exertion. He was found to have an elevated troponin today of 0.1. Chest x-ray shows cardiomegaly with new pulmonary vascular congestion consistent with heart failure. Cardiology was asked to consult regarding his chest pain and shortness of breath.    Pt was diuresed and Echo with EF of 15%, with troponin of 0.14.  He then underwent cardiac cath 09/27/14 with normal coronary arteries and  EF 15%   Left ventricular angiogram was done in the 30 RAO projection and revealed markedly dilated and globally hypokinetic.  Aortic pressure 98/70 mmHg; LV pressure 100/7 mmHg; LVEDP 17 mmHg; RA 2 mmHg; RV 37/5 mmHg; PA 38/19 mmHg; PCWP(mean) 13 mmHg ; Cardiac Output 4.48 L/m; AV gradient 2 mmHg.     His ACE has been stopped and he will begin Entresto 49-51 on the 10th of April.  He will follow up TOC in 5-7 days and then with Dr. Debara Pickett.  He has been seen and found stable for discharge by Dr. Debara Pickett. Will need repeat Echo in 3 months and possibility of ICD if EF remains low.   Have arranged out pt Hematology appt for erythrocytosis with low platelet count.   I&O -8841,  Weight down from 206 on admit to 197 at discharge.   Pt will need outpt BMP and CBC.   Consults: None  Significant Diagnostic Studies:  BMET    Component Value Date/Time   NA 140 09/28/2014 0331   K 3.8 09/28/2014 0331   CL 104 09/28/2014 0331   CO2 30 09/28/2014 0331   GLUCOSE 119* 09/28/2014 0331  BUN 14 09/28/2014 0331   CREATININE 1.32 09/28/2014 0331   CALCIUM 9.1 09/28/2014 0331   GFRNONAA 57* 09/28/2014 0331   GFRAA 66* 09/28/2014 0331    CBC    Component Value Date/Time   WBC 7.9 09/28/2014 0331   RBC 6.18* 09/28/2014 0331   HGB 17.7* 09/28/2014 0331   HCT 52.7* 09/28/2014 0331   PLT 139* 09/28/2014 0331   MCV 85.3 09/28/2014 0331   MCH 28.6 09/28/2014 0331   MCHC 33.6 09/28/2014 0331   RDW 15.5 09/28/2014 0331   LYMPHSABS 2.2 09/11/2014 0820     MONOABS 0.8 09/11/2014 0820   EOSABS 0.1 09/11/2014 0820   BASOSABS 0.0 09/11/2014 0820   BNP  1293  Troponin 0.14, 0.15, 0.16  TSH 2.242    ECHO: Study Conclusions  - Left ventricle: The cavity size was severely dilated. Wall thickness was increased in a pattern of mild LVH. Systolic function was severely reduced. The estimated ejection fraction was in the range of 15% to 20%. Diffuse hypokinesis. There is akinesis of the entireanteroseptal myocardium. Doppler parameters are consistent with abnormal left ventricular relaxation (grade 1 diastolic dysfunction). There is muscular trabeculation at apex of left ventricle (no obvious thrombus identified). Definity contrast used. - Mitral valve: There was mild regurgitation. - Left atrium: The atrium was moderately dilated. - Right ventricle: The cavity size was moderately dilated. Wall thickness was normal. - Right atrium: The atrium was mildly dilated.  CHEST 2 VIEW COMPARISON: 09/11/2014 FINDINGS: Enlargement of cardiac silhouette with pulmonary vascularcongestion. Mediastinal contours normal. Lungs clear. No pulmonary infiltrate, pleural effusion or pneumothorax. Osseous structures unremarkable. IMPRESSION: Enlargement of cardiac silhouette with pulmonary vascular congestion. No acute infiltrate or pulmonary edema identified.  Cardiac Cath: HEMODYNAMICS: Aortic pressure 98/70 mmHg; LV pressure 100/7 mmHg; LVEDP 17 mmHg; RA 2 mmHg; RV 37/5 mmHg; PA 38/19 mmHg; PCWP(mean) 13 mmHg ; Cardiac Output 4.48 L/m; AV gradient 2 mmHg  ANGIOGRAPHIC DATA: The left main coronary artery is normal.  The left anterior descending artery is normal.  The left circumflex artery is normal.  The right coronary artery is dominant and normal.   LEFT VENTRICULOGRAM: Left ventricular angiogram was done in the 30 RAO projection and revealed markedly dilated and globally hypokinetic with an EF of  15%.   IMPRESSIONS: 1. Normal coronary arteries 2. Dilated LV with severe reduction in systolic function, estimated EF 15%.   RECOMMENDATION: Medical therapy for systolic heart failure.[name]    Discharge Exam: Blood pressure 137/95, pulse 81, temperature 98.2 F (36.8 C), temperature source Oral, resp. rate 20, height 5\' 10"  (1.778 m), weight 197 lb 5 oz (89.5 kg), SpO2 96 %.    Disposition: 01-Home or Self Care     Medication List    STOP taking these medications        benazepril 40 MG tablet  Commonly known as:  LOTENSIN      TAKE these medications        aspirin 81 MG EC tablet  Take 1 tablet (81 mg total) by mouth daily.     carvedilol 25 MG tablet  Commonly known as:  COREG  Take 1 tablet (25 mg total) by mouth 2 (two) times daily with a meal.     colchicine 0.6 MG tablet  Take 1 tablet (0.6 mg total) by mouth daily.     furosemide 40 MG tablet  Commonly known as:  LASIX  Take 1 tablet (40 mg total) by mouth daily.     ondansetron 4  MG disintegrating tablet  Commonly known as:  ZOFRAN ODT  1 tab q6h PRN nausea/vomiting     sacubitril-valsartan 49-51 MG  Commonly known as:  ENTRESTO  Take 1 tablet by mouth 2 (two) times daily.  Start taking on:  09/30/2014     traMADol 50 MG tablet  Commonly known as:  ULTRAM  Take 1 tablet (50 mg total) by mouth every 8 (eight) hours as needed.      ASK your doctor about these medications        cyclobenzaprine 10 MG tablet  Commonly known as:  FLEXERIL  Take 1 tablet (10 mg total) by mouth 2 (two) times daily as needed for muscle spasms.       Follow-up Information    Follow up with Richardson Dopp, PA-C On 10/05/2014.   Specialty:  Physician Assistant   Why:  at 10:00AM   Contact information:   [address] [place] [address] [phone]       Follow up with Pixie Casino, MD On 11/09/2014.   Specialty:  Cardiology   Why:  at 10:00 AM   Contact information:   [place] [address] [phone]       Follow up with Rulon Eisenmenger, MD On 10/22/2014.   Specialty:  Hematology and Oncology   Why:  please call and check with them on the time.    Contact information:   [place] [address] [phone]        Discharge Instructions: Call [facility] at [phone] if any bleeding, swelling or drainage at cath site.  May shower, no tub baths for 48 hours for groin sticks. No lifting over 5 pounds for 3 days.  No Driving for 3 days.    Weigh daily today at discharge your weight is 197 lbs.  Call [phone] if weight climbs more than 3 pounds in a day or 5 pounds in a week. No salt to very little salt in your diet.  No more than 2000 mg in a day. Call if increased shortness of breath or increased swelling.   stop benazepril, we increased your lasix, and you will begin Entresto on Sunday the 10th.  Call the office if any increased shortness of breath or as above.  Signed: Isaiah Serge Nurse Practitioner-Certified  Medical Group: [facility] 09/28/2014, 11:23 AM  Time spent on discharge : > 30 minutes.

## 2014-09-28 NOTE — Telephone Encounter (Signed)
Pt came in to [facility] office & got Entresto samples - I unfortunately only had 1 box available. Instructed to ask at [facility] if samples available when he sees [place] for [employer]. Pt voiced understanding.

## 2014-09-28 NOTE — Progress Notes (Signed)
DAILY PROGRESS NOTE  Subjective:  Diuresed about 3L - weight down to 197. Breathing is better. Echo shows severely reduced LV function down to about 15%.  Cath yesterday shows normal coronaries.  Right heart cath with low filling pressures (adequate diuresis) - cardiac output 4.5 L/min.  Objective:  Temp:  [98.2 F (36.8 C)-98.5 F (36.9 C)] 98.2 F (36.8 C) (04/08 0523) Pulse Rate:  [81-95] 81 (04/08 0523) Resp:  [13-25] 20 (04/08 0523) BP: (105-132)/(73-91) 113/73 mmHg (04/08 0523) SpO2:  [96 %-97 %] 96 % (04/08 0523) Weight:  [197 lb 5 oz (89.5 kg)] 197 lb 5 oz (89.5 kg) (04/08 0523) Weight change: -2 lb 12.6 oz (-1.265 kg)  Intake/Output from previous day: 04/07 0701 - 04/08 0700 In: 595 [P.O.:595] Out: 1600 [Urine:1600]  Intake/Output from this shift: Total I/O In: 240 [P.O.:240] Out: -   Medications: Current Facility-Administered Medications  Medication Dose Route Frequency Provider Last Rate Last Dose  . acetaminophen (TYLENOL) tablet 650 mg  650 mg Oral Q4H PRN Rogelia Mire, NP      . aspirin EC tablet 81 mg  81 mg Oral Daily Rogelia Mire, NP   81 mg at 09/27/14 0855  . benazepril (LOTENSIN) tablet 40 mg  40 mg Oral Daily Rogelia Mire, NP   40 mg at 09/27/14 1129  . carvedilol (COREG) tablet 25 mg  25 mg Oral BID WC Rogelia Mire, NP   25 mg at 09/27/14 1728  . colchicine tablet 0.6 mg  0.6 mg Oral Daily Rogelia Mire, NP   0.6 mg at 09/27/14 0854  . furosemide (LASIX) injection 40 mg  40 mg Intravenous BID Rogelia Mire, NP   40 mg at 09/27/14 1728  . heparin injection 5,000 Units  5,000 Units Subcutaneous 3 times per day Belva Crome, MD   5,000 Units at 09/28/14 [phone]  . ondansetron (ZOFRAN) injection 4 mg  4 mg Intravenous Q6H PRN Rogelia Mire, NP   4 mg at 09/25/14 1959  . oxyCODONE-acetaminophen (PERCOCET/ROXICET) 5-325 MG per tablet 1-2 tablet  1-2 tablet Oral Q4H PRN Belva Crome, MD      . traMADol Veatrice Bourbon)  tablet 50 mg  50 mg Oral Q8H PRN Rogelia Mire, NP        Physical Exam: General appearance: alert and no distress Neck: no carotid bruit and no JVD Lungs: clear to auscultation bilaterally Heart: regular rate and rhythm, S1, S2 normal and systolic murmur: early systolic 3/6, blowing at apex Abdomen: soft, non-tender; bowel sounds normal; no masses,  no organomegaly Extremities: extremities normal, atraumatic, no cyanosis or edema Pulses: 2+ and symmetric Skin: Skin color, texture, turgor normal. No rashes or lesions Neurologic: Grossly normal  Lab Results: Results for orders placed or performed during the hospital encounter of 09/25/14 (from the past 48 hour(s))  Heparin level (unfractionated)     Status: None   Collection Time: 09/26/14  1:19 PM  Result Value Ref Range   Heparin Unfractionated 0.42 0.30 - 0.70 IU/mL    Comment:        IF HEPARIN RESULTS ARE BELOW EXPECTED VALUES, AND PATIENT DOSAGE HAS BEEN CONFIRMED, SUGGEST FOLLOW UP TESTING OF ANTITHROMBIN III LEVELS.   Basic metabolic panel     Status: Abnormal   Collection Time: 09/27/14  3:30 AM  Result Value Ref Range   Sodium 138 135 - 145 mmol/L   Potassium 3.6 3.5 - 5.1 mmol/L   Chloride 103 96 -  112 mmol/L   CO2 23 19 - 32 mmol/L   Glucose, Bld 96 70 - 99 mg/dL   BUN 15 6 - 23 mg/dL   Creatinine, Ser 1.22 0.50 - 1.35 mg/dL   Calcium 8.8 8.4 - 10.5 mg/dL   GFR calc non Af Amer 63 (L) >90 mL/min   GFR calc Af Amer 73 (L) >90 mL/min    Comment: (NOTE) The eGFR has been calculated using the CKD EPI equation. This calculation has not been validated in all clinical situations. eGFR's persistently <90 mL/min signify possible Chronic Kidney Disease.    Anion gap 12 5 - 15  Heparin level (unfractionated)     Status: None   Collection Time: 09/27/14  3:30 AM  Result Value Ref Range   Heparin Unfractionated 0.60 0.30 - 0.70 IU/mL    Comment:        IF HEPARIN RESULTS ARE BELOW EXPECTED VALUES, AND  PATIENT DOSAGE HAS BEEN CONFIRMED, SUGGEST FOLLOW UP TESTING OF ANTITHROMBIN III LEVELS.   CBC     Status: Abnormal   Collection Time: 09/27/14  3:30 AM  Result Value Ref Range   WBC 8.6 4.0 - 10.5 K/uL   RBC 5.91 (H) 4.22 - 5.81 MIL/uL   Hemoglobin 16.9 13.0 - 17.0 g/dL   HCT 50.8 39.0 - 52.0 %   MCV 86.0 78.0 - 100.0 fL   MCH 28.6 26.0 - 34.0 pg   MCHC 33.3 30.0 - 36.0 g/dL   RDW 15.7 (H) 11.5 - 15.5 %   Platelets 122 (L) 150 - 400 K/uL  I-STAT 3, venous blood gas (G3P V)     Status: Abnormal   Collection Time: 09/27/14  2:14 PM  Result Value Ref Range   pH, Ven 7.330 (H) 7.250 - 7.300   pCO2, Ven 50.5 (H) 45.0 - 50.0 mmHg   pO2, Ven 39.0 30.0 - 45.0 mmHg   Bicarbonate 26.7 (H) 20.0 - 24.0 mEq/L   TCO2 28 0 - 100 mmol/L   O2 Saturation 69.0 %   Sample type VENOUS   I-STAT 3, arterial blood gas (G3+)     Status: Abnormal   Collection Time: 09/27/14  2:21 PM  Result Value Ref Range   pH, Arterial 7.367 7.350 - 7.450   pCO2 arterial 47.5 (H) 35.0 - 45.0 mmHg   pO2, Arterial 87.0 80.0 - 100.0 mmHg   Bicarbonate 27.2 (H) 20.0 - 24.0 mEq/L   TCO2 29 0 - 100 mmol/L   O2 Saturation 96.0 %   Acid-Base Excess 1.0 0.0 - 2.0 mmol/L   Sample type ARTERIAL   Basic metabolic panel     Status: Abnormal   Collection Time: 09/28/14  3:31 AM  Result Value Ref Range   Sodium 140 135 - 145 mmol/L   Potassium 3.8 3.5 - 5.1 mmol/L   Chloride 104 96 - 112 mmol/L   CO2 30 19 - 32 mmol/L   Glucose, Bld 119 (H) 70 - 99 mg/dL   BUN 14 6 - 23 mg/dL   Creatinine, Ser 1.32 0.50 - 1.35 mg/dL   Calcium 9.1 8.4 - 10.5 mg/dL   GFR calc non Af Amer 57 (L) >90 mL/min   GFR calc Af Amer 66 (L) >90 mL/min    Comment: (NOTE) The eGFR has been calculated using the CKD EPI equation. This calculation has not been validated in all clinical situations. eGFR's persistently <90 mL/min signify possible Chronic Kidney Disease.    Anion gap 6 5 - 15  CBC     Status: Abnormal   Collection Time: 09/28/14   3:31 AM  Result Value Ref Range   WBC 7.9 4.0 - 10.5 K/uL   RBC 6.18 (H) 4.22 - 5.81 MIL/uL   Hemoglobin 17.7 (H) 13.0 - 17.0 g/dL   HCT 52.7 (H) 39.0 - 52.0 %   MCV 85.3 78.0 - 100.0 fL   MCH 28.6 26.0 - 34.0 pg   MCHC 33.6 30.0 - 36.0 g/dL   RDW 15.5 11.5 - 15.5 %   Platelets 139 (L) 150 - 400 K/uL   Imaging: LV EF: 15% -  20%  ------------------------------------------------------------------- Indications:   CHF - 428.0.  ------------------------------------------------------------------- History:  PMH: Elevated Troponin. Angina pectoris. Congestive heart failure. Risk factors: Hypertension. Dyslipidemia.  ------------------------------------------------------------------- Study Conclusions  - Left ventricle: The cavity size was severely dilated. Wall thickness was increased in a pattern of mild LVH. Systolic function was severely reduced. The estimated ejection fraction was in the range of 15% to 20%. Diffuse hypokinesis. There is akinesis of the entireanteroseptal myocardium. Doppler parameters are consistent with abnormal left ventricular relaxation (grade 1 diastolic dysfunction). There is muscular trabeculation at apex of left ventricle (no obvious thrombus identified). Definity contrast used. - Mitral valve: There was mild regurgitation. - Left atrium: The atrium was moderately dilated. - Right ventricle: The cavity size was moderately dilated. Wall thickness was normal. - Right atrium: The atrium was mildly dilated.  ------------------------------------------------------------------- Labs, prior tests, procedures, and surgery: ECG.   Abnormal. Transthoracic echocardiography. M-mode, complete 2D, spectral Doppler, and color Doppler. Birthdate: Patient birthdate: [birth year]. Age: Patient is 59 yr old. Sex: Gender: male. BMI: 28.7 kg/m^2. Blood pressure:   105/73 Patient status: Inpatient. Study date: Study date:  09/27/2014. Study time: 10:29 AM. Location: Bedside.  -------------------------------------------------------------------  ------------------------------------------------------------------- Left ventricle: The cavity size was severely dilated. Wall thickness was increased in a pattern of mild LVH. Systolic function was severely reduced. The estimated ejection fraction was in the range of 15% to 20%. Diffuse hypokinesis. Regional wall motion abnormalities:  There is akinesis of the entireanteroseptal myocardium. Doppler parameters are consistent with abnormal left ventricular relaxation (grade 1 diastolic dysfunction). There is muscular trabeculation at apex of left ventricle (no obvious thrombus identified). Definity contrast used.  ------------------------------------------------------------------- Aortic valve:  Trileaflet; mildly thickened, mildly calcified leaflets. Mobility was not restricted. Doppler: Transvalvular velocity was within the normal range. There was no stenosis. There was no regurgitation.  ------------------------------------------------------------------- Aorta: Aortic root: The aortic root was normal in size.  ------------------------------------------------------------------- Mitral valve:  Mildly thickened leaflets . Mobility was not restricted. Doppler: Transvalvular velocity was within the normal range. There was no evidence for stenosis. There was mild regurgitation.  ------------------------------------------------------------------- Left atrium: The atrium was moderately dilated.  ------------------------------------------------------------------- Right ventricle: The cavity size was moderately dilated. Wall thickness was normal. Systolic function was normal.  ------------------------------------------------------------------- Pulmonic valve:  Structurally normal valve.  Cusp separation was normal. Doppler: Transvalvular  velocity was within the normal range. There was no evidence for stenosis. There was no regurgitation.  ------------------------------------------------------------------- Tricuspid valve:  Structurally normal valve.  Doppler: Transvalvular velocity was within the normal range. There was no regurgitation.  ------------------------------------------------------------------- Pulmonary artery:  The main pulmonary artery was mildly dilated. Systolic pressure was within the normal range.  ------------------------------------------------------------------- Right atrium: The atrium was mildly dilated.  ------------------------------------------------------------------- Pericardium: There was no pericardial effusion.  ------------------------------------------------------------------- Systemic veins: Inferior vena cava: The vessel was normal in size.  ------------------------------------------------------------------- Measurements  Left ventricle  Value    Reference LV ID, ED, PLAX chordal       (H)   71.7 mm   43 - 52 LV ID, ES, PLAX chordal       (H)   67.3 mm   23 - 38 LV fx shortening, PLAX chordal   (L)   6   %   >=29 LV PW thickness, ED             18.6 mm   --------- IVS/LV PW ratio, ED             0.6     <=1.3  Ventricular septum             Value    Reference IVS thickness, ED              11.2 mm   ---------  Aorta                    Value    Reference Aortic root ID, ED             34  mm   ---------  Left atrium                 Value    Reference LA ID, A-P, ES               47  mm   --------- LA ID/bsa, A-P               2.2  cm/m^2 <=2.2 LA volume, S                88  ml   --------- LA volume/bsa, S               41.2 ml/m^2 --------- LA volume, ES, 1-p A4C           64  ml   --------- LA volume/bsa, ES, 1-p A4C         30  ml/m^2 --------- LA volume, ES, 1-p A2C           117  ml   --------- LA volume/bsa, ES, 1-p A2C         54.8 ml/m^2 ---------  Mitral valve                Value    Reference Mitral E-wave peak velocity         56.3 cm/s  --------- Mitral A-wave peak velocity         60.2 cm/s  --------- Mitral deceleration time          158  ms   150 - 230 Mitral E/A ratio, peak           0.9     --------- Mitral maximal regurg velocity,       364  cm/s  --------- PISA Mitral regurg VTI, PISA           132  cm   ---------  Systemic veins               Value    Reference Estimated CVP                3   mm Hg ---------  Legend: (L) and (H) mark values outside specified reference range.  ------------------------------------------------------------------- Prepared and Electronically Authenticated by   CATH:  INDICATIONS: Unexplained left heart failure  PROCEDURE: 1. Left heart catheterization; 2. Coronary angiography; 3. Left ventriculography; 4.  Right heart catheterization  CONSENT:  The risks, benefits, and details of the procedure were explained in detail to the patient. Risks including death, stroke, heart attack, kidney injury, allergy, limb ischemia, bleeding and radiation injury were discussed. The patient verbalized understanding and wanted to proceed. Informed written consent was obtained.  PROCEDURE TECHNIQUE: After Xylocaine anesthesia, right heart catheterization was performed from the right antecubital vein in exchange for an 18-gauge Angiocath. Double glove technique was used to and habits sterility. A 5 French brachial sheath was placed using the Seldinger  technique. A 5 French balloon tipped catheter was then used for hemodynamic recordings and pulmonary artery oximetry sampling. After Xylocaine anesthesia, a 5 French Slender sheath was placed in the right radial artery with an angiocath and the modified Seldinger technique. Coronary angiography was done using a 5 F JR4 and JL 3.5 cm diagnostic catheters. Left ventriculography was done using the JR 4 catheter and hand injection.   Digital images were reviewed and the case was terminated.  Hemostasis was achieved with a radial wrist band.  CONTRAST: Total of 70 cc.  COMPLICATIONS: None   HEMODYNAMICS: Aortic pressure 98/70 mmHg; LV pressure 100/7 mmHg; LVEDP 17 mmHg; RA 2 mmHg; RV 37/5 mmHg; PA 38/19 mmHg; PCWP(mean) 13 mmHg ; Cardiac Output 4.48 L/m; AV gradient 2 mmHg  ANGIOGRAPHIC DATA: The left main coronary artery is normal.  The left anterior descending artery is normal.  The left circumflex artery is normal.  The right coronary artery is dominant and normal.   LEFT VENTRICULOGRAM: Left ventricular angiogram was done in the 30 RAO projection and revealed markedly dilated and globally hypokinetic with an EF of 15%.   IMPRESSIONS: 1. Normal coronary arteries 2. Dilated LV with severe reduction in systolic function, estimated EF 15%.  Assessment:  Principal Problem:   Congestive heart failure Active Problems:   Essential hypertension   Hyperlipidemia   Abnormal ECG   Chest pain   Elevated troponin   Acute CHF   Plan:  Echo demonstrates EF of 15% - cath yesterday showed normal coronaries, reduced cardiac output as expected with fairly compensated LV filling pressures at this point.  Will switch to maintenance oral diuretic at this point - 40 mg lasix daily. He is on benezapril 40 mg daily and carvedilol 25 mg BID from home.  Will d/c benazapril and switch to [facility] 49/51 - will need to be off of ACE-I for 36 hours prior to starting Entresto. Will need to  repeat echo in 3 months - if no improvement in LV function, he may ultimately need an AICD (I discussed this with him today). There is also an erythrocytosis with low platelet count. Would recommend an outpatient hematology evaluation. [place] for discharge home today - will need TCM followup in 5-7 days - can ultimately follow-up with me in the office.  Time Spent Directly with Patient:  15 minutes  Length of Stay:  LOS: 3 days   Pixie Casino, MD, [facility] Attending Cardiologist [facility]  HILTY,Kenneth C 09/28/2014, 9:14 AM

## 2014-10-01 ENCOUNTER — Telehealth: Payer: Self-pay | Admitting: Hematology and Oncology

## 2014-10-01 ENCOUNTER — Telehealth (HOSPITAL_COMMUNITY): Payer: Self-pay | Admitting: Surgery

## 2014-10-01 NOTE — Telephone Encounter (Signed)
[place] Entresto-specific prior auth submitted via covermymeds for urgent consideration.

## 2014-10-01 NOTE — Telephone Encounter (Signed)
pt lvm concerned about why he has appts. fwd msg to [facility] new pt appt.

## 2014-10-01 NOTE — Telephone Encounter (Signed)
Spoke to this patient last week in office - he came by day of discharge to get entresto samples.  Called today, he voiced understanding w/ hosp f/u visit, new med.  Does state he needs Prior Authorization for Delene Loll - will begin this prior authorization.

## 2014-10-01 NOTE — Telephone Encounter (Signed)
I called patient back regarding message that he left about recent weight gain.  He was discharged from the hospital on Friday with weight of 197lbs.  His weight today is 199.8lbs.  He feels "ok" and reports no noticeable SOB.  He has been adhering to a low sodium diet and reports that he is only drinking 16 oz of water per day.  I have asked him to call to [facility] if he continues to have weight gain this week or if his HF symptoms become worse.  He acknowledges understanding and agrees.  His post hosp. F/U appt is Friday at [facility].

## 2014-10-02 ENCOUNTER — Telehealth: Payer: Self-pay | Admitting: Internal Medicine

## 2014-10-02 NOTE — Telephone Encounter (Signed)
Spoke with patient. He states his pharmacy told him the co-pay for Delene Loll would be $100/month and he cannot afford this. He states he has already completed the enrollment form for [facility], he has a co-pay card and a free 30 day trial card. Informed patient that Ovid Curd, RN processed a prior authorization for this medication yesterday and we should know the outcome of this either today or tomorrow. Once approved, he should be able to use the free 30 day trial card and then the $10 co-pay card should apply for future payments. Patient has the medication - no samples of his dose are available at our office  Will defer to [facility], RN to follow up on PA status on Wednesday 413

## 2014-10-02 NOTE — Telephone Encounter (Signed)
Please call,need to ask some questions,regarding his hospitalization.

## 2014-10-02 NOTE — Telephone Encounter (Signed)
Per Answering Service: RX samples were given at the office.was sent to [facility] office. They had none in stock, please call.

## 2014-10-02 NOTE — Telephone Encounter (Signed)
Called patient and confirmed it was OK to speak with [employer] on his behalf r/t short term disability   Spoke with Broadus John with [facility]. He needed information if a procedure was performed during hospital stay - yes (R & L heart cath) and if patient was discharged - yes. He asked if it was advised that patient stay out of work and Broadus John was informed that this was not explicitly stated in discharge summary but it would be safe to presume that he should stay out of work until follow up on 4/15 when his situation could be reassessed. Broadus John said they faxed over paperwork for the MD to sign.

## 2014-10-04 NOTE — Telephone Encounter (Signed)
PA status approved.  Spoke w/ patient, he was able to pick up meds at pharmacy yesterday w/ copay card. Stated no problems getting med filled. Told to call if questions. Pt voiced understanding.

## 2014-10-05 ENCOUNTER — Ambulatory Visit (INDEPENDENT_AMBULATORY_CARE_PROVIDER_SITE_OTHER): Payer: BLUE CROSS/BLUE SHIELD | Admitting: Physician Assistant

## 2014-10-05 ENCOUNTER — Encounter: Payer: Self-pay | Admitting: Physician Assistant

## 2014-10-05 VITALS — BP 110/80 | HR 75 | Ht 71.0 in | Wt 205.6 lb

## 2014-10-05 DIAGNOSIS — I429 Cardiomyopathy, unspecified: Secondary | ICD-10-CM | POA: Diagnosis not present

## 2014-10-05 DIAGNOSIS — R0683 Snoring: Secondary | ICD-10-CM | POA: Diagnosis not present

## 2014-10-05 DIAGNOSIS — I10 Essential (primary) hypertension: Secondary | ICD-10-CM

## 2014-10-05 DIAGNOSIS — I5022 Chronic systolic (congestive) heart failure: Secondary | ICD-10-CM

## 2014-10-05 DIAGNOSIS — D751 Secondary polycythemia: Secondary | ICD-10-CM | POA: Diagnosis not present

## 2014-10-05 DIAGNOSIS — R9431 Abnormal electrocardiogram [ECG] [EKG]: Secondary | ICD-10-CM | POA: Diagnosis not present

## 2014-10-05 DIAGNOSIS — I428 Other cardiomyopathies: Secondary | ICD-10-CM

## 2014-10-05 LAB — BASIC METABOLIC PANEL
BUN: 12 mg/dL (ref 6–23)
CALCIUM: 9.7 mg/dL (ref 8.4–10.5)
CO2: 24 meq/L (ref 19–32)
CREATININE: 1.15 mg/dL (ref 0.40–1.50)
Chloride: 103 mEq/L (ref 96–112)
GFR: 83.4 mL/min (ref >60.00)
GLUCOSE: 122 mg/dL — AB (ref 70–99)
Potassium: 4.5 mEq/L (ref 3.5–5.1)
Sodium: 134 mEq/L — ABNORMAL LOW (ref 135–145)

## 2014-10-05 LAB — FERRITIN: Ferritin: 65.8 ng/mL (ref 22.0–322.0)

## 2014-10-05 LAB — IRON: IRON: 170 ug/dL — AB (ref 42–165)

## 2014-10-05 NOTE — Patient Instructions (Signed)
Medication Instructions:  Your physician recommends that you continue on your current medications as directed. Please refer to the Current Medication list given to you today.   Labwork: TODAY; BMET, IRON, TIBC, FERRITIN  Testing/Procedures: SLEEP STUDY  Follow-Up: KEEP YOUR APPT WITH DR. HILTY 11/09/14  Any Other Special Instructions Will Be Listed Below (If Applicable).

## 2014-10-05 NOTE — Progress Notes (Signed)
Cardiology Office Note   Date:  10/05/2014   ID:  Brick Ketcher, DOB Oct 31, 1955, MRN [id number]  PCP:  Cathlean Cower, MD  Cardiologist:  Dr. K. Mali Hilty     Chief Complaint  Patient presents with  . Congestive Heart Failure  . Hospitalization Follow-up     History of Present Illness: Matthew Guerrero is a 59 y.o. male with a hx of HTN, HL, gout, asthma.  He was seen in the emergency room in late March with abdominal pain and vomiting. ECG was abnormal and a fast scan demonstrated no pericardial effusion but global hypokinesis. Plan was to follow-up as an outpatient. However, he presented back to the emergency room with progressive shortness of breath and chest pain with exertion.  Admitted 4/5-4/8 with acute systolic HF.  EF was noted to be 15-20% on echo.  Troponin was minimally elevated (0.14).   He was diuresed.  LHC was arranged and demonstrated normal coronary arteries.  He was dx with NICM.  Medications were adjusted for CHF and he returns for FU.  Of note, ACE inhibitor was stopped and he was placed on Entresto.    Since DC, he is doing well.  His breathing is improved. He is NYHA 2b.  He denies orthopnea, PND, edema.  He denies chest pain, syncope.  Does admit to snoring and does take daytime naps.  He has a non-productive cough.     Studies/Reports Reviewed Today:  Echo 09/27/14 -  Mild LVH. EF 15% to 20%. Diffuse hypokinesis. There is akinesis of the entireanteroseptal myocardium. Grade 1 diastolic dysfunction. There is muscular trabeculation at apex of left ventricle (no obvious thrombus identified).  Definity contrast used. - Mitral valve: There was mild regurgitation. - Left atrium: The atrium was moderately dilated. - Right ventricle: The cavity size was moderately dilated. Wall thickness was normal. - Right atrium: The atrium was mildly dilated.  LHC 09/27/14 The left main coronary artery is normal. The left anterior descending artery is normal. The left circumflex  artery is normal. The right coronary artery is dominant and normal. EF: 15%.   Past Medical History  Diagnosis Date  . ALLERGIC RHINITIS   . GOUT   . HYPERLIPIDEMIA   . HYPERTENSION   . Asthma   . Cervical radiculitis   . Lateral epicondylitis of right elbow   . Hyperglycemia 08/28/2014  . S/P cardiac cath wjith normal coronary arteries  09/28/2014    Past Surgical History  Procedure Laterality Date  . Colonoscopy    . Left and right heart catheterization with coronary angiogram N/A 09/27/2014    Procedure: LEFT AND RIGHT HEART CATHETERIZATION WITH CORONARY ANGIOGRAM;  Surgeon: Belva Crome, MD;  Location: [facility] CATH LAB;  Service: Cardiovascular;  Laterality: N/A;     Current Outpatient Prescriptions  Medication Sig Dispense Refill  . aspirin EC 81 MG EC tablet Take 1 tablet (81 mg total) by mouth daily.    . carvedilol (COREG) 25 MG tablet Take 1 tablet (25 mg total) by mouth 2 (two) times daily with a meal. 60 tablet 11  . colchicine 0.6 MG tablet Take 1 tablet (0.6 mg total) by mouth daily. 90 tablet 1  . cyclobenzaprine (FLEXERIL) 10 MG tablet Take 1 tablet (10 mg total) by mouth 2 (two) times daily as needed for muscle spasms. 20 tablet 0  . furosemide (LASIX) 40 MG tablet Take 1 tablet (40 mg total) by mouth daily. 30 tablet 6  . ondansetron (ZOFRAN ODT) 4 MG disintegrating  tablet 1 tab q6h PRN nausea/vomiting (Patient taking differently: Take 4 mg by mouth every 6 (six) hours as needed for nausea or vomiting. ) 10 tablet 0  . sacubitril-valsartan (ENTRESTO) 49-51 MG Take 1 tablet by mouth 2 (two) times daily. 28 tablet 0  . traMADol (ULTRAM) 50 MG tablet Take 1 tablet (50 mg total) by mouth every 8 (eight) hours as needed. (Patient taking differently: Take 50 mg by mouth every 8 (eight) hours as needed for moderate pain. ) 270 tablet 1   No current facility-administered medications for this visit.    Allergies:   Review of patient's allergies indicates no known allergies.     Social History:  The patient  reports that he has never smoked. He has never used smokeless tobacco. He reports that he does not drink alcohol or use illicit drugs.   Family History:  The patient's family history includes Cancer in his mother; Diabetes in his father; Heart disease in his father. There is no history of Colon cancer, Esophageal cancer, Rectal cancer, or Stomach cancer.    ROS:   Please see the history of present illness.   Review of Systems  Constitution: Positive for weight gain.  Cardiovascular: Positive for dyspnea on exertion, irregular heartbeat and orthopnea.  Respiratory: Positive for snoring and wheezing.   Gastrointestinal: Positive for abdominal pain.  Neurological: Positive for dizziness.  All other systems reviewed and are negative.    PHYSICAL EXAM: VS:  BP 110/80 mmHg  Pulse 75  Ht 5\' 11"  (1.803 m)  Wt 205 lb 9.6 oz (93.26 kg)  BMI 28.69 kg/m2    Wt Readings from Last 3 Encounters:  10/05/14 205 lb 9.6 oz (93.26 kg)  09/28/14 197 lb 5 oz (89.5 kg)  09/08/14 213 lb 8 oz (96.843 kg)     GEN: Well nourished, well developed, in no acute distress HEENT: normal Neck: no JVD, no masses Cardiac:  Normal S1/S2, RRR; no murmur   no rubs or gallops, no edema; right wrist without hematoma or mass   Respiratory:  clear to auscultation bilaterally, no wheezing, rhonchi or rales. GI: soft, nontender, nondistended, + BS MS: no deformity or atrophy Skin: warm and dry  Neuro:  CNs II-XII intact, Strength and sensation are intact Psych: Normal affect   EKG:  EKG is ordered today.  It demonstrates:   NSR, HR 75, LAD, IVCD, LVH, inf-lat TWI (prob repol), QTc 460, no change from prior tracing.    Recent Labs: 09/25/2014: TSH 2.242 09/26/2014: ALT 33; B Natriuretic Peptide 1293.3*; Magnesium 1.9 09/28/2014: BUN 14; Creatinine 1.32; Hemoglobin 17.7*; Platelets 139*; Potassium 3.8; Sodium 140    Lipid Panel    Component Value Date/Time   CHOL 158 08/24/2014  0800   TRIG 142.0 08/24/2014 0800   HDL 35.10* 08/24/2014 0800   CHOLHDL 5 08/24/2014 0800   VLDL 28.4 08/24/2014 0800   LDLCALC 95 08/24/2014 0800   LDLDIRECT 105.8 06/18/2010 0806      ASSESSMENT AND PLAN:  Chronic systolic CHF (congestive heart failure) He is NYHA 2b.  Volume is stable.  Continue current Rx.  Check BMET today.  NICM (nonischemic cardiomyopathy) Continue beta-blocker, Entresto.  Check BMET today.  Consider adding Spironolactone. He will need repeat echo in 3 mos. If EF < 35% will need EP referral for ICD.  He does not recall a recent viral illness.  No hx of ETOH abuse.  His BP was too far out of control. He does have a high Hgb, RBC.  Will check Iron studies as well.  No low voltage on ECG.  No evidence of Amyloid on echo.   Essential hypertension Controlled.   Snoring Plan: Nocturnal polysomnography  Erythrocytosis May be explained by untreated OSA.  Will check Fe studies as noted.  May need referral to Heme >> looks like he already has a referral for heme and appt pending.    Current medicines are reviewed at length with the patient today.  The patient does not have concerns regarding medicines.  The following changes have been made:  no change   Labs/ tests ordered today include:  Orders Placed This Encounter  Procedures  . Basic Metabolic Panel (BMET)  . Ferritin  . Iron  . Iron Binding Cap (TIBC)  . EKG 12-Lead  . Nocturnal polysomnography    Disposition:   FU with Dr. K. Mali Hilty next month as planned.    Signed, Versie Starks, MHS 10/05/2014 10:11 AM    [facility] [address] [place], [place], [place]  [address] Phone: [phone]; Fax: [phone]

## 2014-10-06 LAB — IRON AND TIBC
%SAT: 42 % (ref 20–55)
Iron: 172 ug/dL — ABNORMAL HIGH (ref 42–165)
TIBC: 411 ug/dL (ref 215–435)
UIBC: 239 ug/dL (ref 125–400)

## 2014-10-08 ENCOUNTER — Telehealth: Payer: Self-pay | Admitting: *Deleted

## 2014-10-08 ENCOUNTER — Telehealth: Payer: Self-pay | Admitting: Internal Medicine

## 2014-10-08 NOTE — Telephone Encounter (Signed)
Mr. Rohl is calling to give the the telephone number and Fax number to his insurance company for his Short Term Disability .Fax  # [phone], and use reference number [id number]. Please call if you have any questions .   Thanks

## 2014-10-08 NOTE — Telephone Encounter (Signed)
Noted.   Disability paperwork is with [employer]

## 2014-10-08 NOTE — Telephone Encounter (Signed)
Patient notified that short term disability paperwork is being processed thru [employer] and has not come back to Dr. Debara Pickett to sign off on yet. He voiced understanding of this.

## 2014-10-08 NOTE — Telephone Encounter (Signed)
pt notified about lab results. Pt states to me that some forms were faxed over the other day for the dr to fill out and sign. I advised pt that he will need to s/w Med Rec Dept. I aksed pt did he want med rec dept to call him, pt said yes.

## 2014-10-08 NOTE — Telephone Encounter (Signed)
Please call,they are still waiting for his disability form to be completed. They need it today. Please fax [phone]

## 2014-10-09 ENCOUNTER — Other Ambulatory Visit (HOSPITAL_COMMUNITY): Payer: BLUE CROSS/BLUE SHIELD

## 2014-10-09 ENCOUNTER — Ambulatory Visit: Payer: BLUE CROSS/BLUE SHIELD | Admitting: Cardiology

## 2014-10-10 ENCOUNTER — Telehealth: Payer: Self-pay | Admitting: Internal Medicine

## 2014-10-10 NOTE — Telephone Encounter (Signed)
Patient visited office 4/20 and signed Release and pmt for [employer] to process his [employer] Attending Physician Statement.  Sent to [employer] on 10/10/14.  (They already have Attending Physician Statement form sent on 09/27/14.)

## 2014-10-22 ENCOUNTER — Ambulatory Visit: Payer: BLUE CROSS/BLUE SHIELD

## 2014-10-22 ENCOUNTER — Telehealth: Payer: Self-pay | Admitting: Hematology and Oncology

## 2014-10-22 ENCOUNTER — Ambulatory Visit (HOSPITAL_BASED_OUTPATIENT_CLINIC_OR_DEPARTMENT_OTHER): Payer: BLUE CROSS/BLUE SHIELD | Admitting: Hematology and Oncology

## 2014-10-22 ENCOUNTER — Encounter: Payer: Self-pay | Admitting: Hematology and Oncology

## 2014-10-22 DIAGNOSIS — D696 Thrombocytopenia, unspecified: Secondary | ICD-10-CM

## 2014-10-22 DIAGNOSIS — I429 Cardiomyopathy, unspecified: Secondary | ICD-10-CM | POA: Diagnosis not present

## 2014-10-22 DIAGNOSIS — D751 Secondary polycythemia: Secondary | ICD-10-CM | POA: Diagnosis not present

## 2014-10-22 DIAGNOSIS — I509 Heart failure, unspecified: Secondary | ICD-10-CM

## 2014-10-22 NOTE — Telephone Encounter (Signed)
Gave avs & calendar for May. °

## 2014-10-22 NOTE — Progress Notes (Signed)
[facility] CONSULT NOTE  Patient Care Team: Biagio Borg, MD as PCP - General  CHIEF COMPLAINTS/PURPOSE OF CONSULTATION:  erythrocytosis  HISTORY OF PRESENTING ILLNESS:  Matthew Guerrero 59 y.o. male is here because of recent diagnosis of erythrocytosis with a hemoglobin of 17.7. He reports that over the past month or 2 months he has been having problems with his heart. He was diagnosed with congestive heart failure and cardiomyopathy. Currently he is improving and his symptoms got much better. He has been short of breath over the past 2 months. He is being set up to undergo sleep study coming up shortly. On routine blood work he was found to have a hemoglobin of 17.7. This was felt to be related to his underlying hypoxia driven issues. He was sent to [place] for further discussion regarding his elevated hemoglobin.  I reviewed her records extensively and collaborated the history with the patient.  MEDICAL HISTORY:  Past Medical History  Diagnosis Date  . ALLERGIC RHINITIS   . GOUT   . HYPERLIPIDEMIA   . HYPERTENSION   . Asthma   . Cervical radiculitis   . Lateral epicondylitis of right elbow   . Hyperglycemia 08/28/2014  . S/P cardiac cath wjith normal coronary arteries  09/28/2014    SURGICAL HISTORY: Past Surgical History  Procedure Laterality Date  . Colonoscopy    . Left and right heart catheterization with coronary angiogram N/A 09/27/2014    Procedure: LEFT AND RIGHT HEART CATHETERIZATION WITH CORONARY ANGIOGRAM;  Surgeon: Belva Crome, MD;  Location: [facility] CATH LAB;  Service: Cardiovascular;  Laterality: N/A;    SOCIAL HISTORY: History   Social History  . Marital Status: Single    Spouse Name: N/A  . Number of Children: N/A  . Years of Education: N/A   Occupational History  . Not on file.   Social History Main Topics  . Smoking status: Never Smoker   . Smokeless tobacco: Never Used  . Alcohol Use: No  . Drug Use: No  . Sexual Activity: Not on file    Other Topics Concern  . Not on file   Social History Narrative    FAMILY HISTORY: Family History  Problem Relation Age of Onset  . Cancer Mother   . Diabetes Father   . Heart disease Father   . Colon cancer Neg Hx   . Esophageal cancer Neg Hx   . Rectal cancer Neg Hx   . Stomach cancer Neg Hx     ALLERGIES:  has No Known Allergies.  MEDICATIONS:  Current Outpatient Prescriptions  Medication Sig Dispense Refill  . aspirin EC 81 MG EC tablet Take 1 tablet (81 mg total) by mouth daily.    . carvedilol (COREG) 25 MG tablet Take 1 tablet (25 mg total) by mouth 2 (two) times daily with a meal. 60 tablet 11  . colchicine 0.6 MG tablet Take 1 tablet (0.6 mg total) by mouth daily. 90 tablet 1  . furosemide (LASIX) 40 MG tablet Take 1 tablet (40 mg total) by mouth daily. 30 tablet 6  . sacubitril-valsartan (ENTRESTO) 49-51 MG Take 1 tablet by mouth 2 (two) times daily. 28 tablet 0  . cyclobenzaprine (FLEXERIL) 10 MG tablet Take 1 tablet (10 mg total) by mouth 2 (two) times daily as needed for muscle spasms. (Patient not taking: Reported on 10/22/2014) 20 tablet 0  . ondansetron (ZOFRAN ODT) 4 MG disintegrating tablet 1 tab q6h PRN nausea/vomiting (Patient not taking: Reported on 10/22/2014) 10  tablet 0  . traMADol (ULTRAM) 50 MG tablet Take 1 tablet (50 mg total) by mouth every 8 (eight) hours as needed. (Patient not taking: Reported on 10/22/2014) 270 tablet 1   No current facility-administered medications for this visit.    REVIEW OF SYSTEMS:   Constitutional: Denies fevers, chills or abnormal night sweats Eyes: Denies blurriness of vision, double vision or watery eyes Ears, nose, mouth, throat, and face: Denies mucositis or sore throat Respiratory: Denies cough, dyspnea or wheezes Cardiovascular: Denies palpitation, chest discomfort or lower extremity swelling Gastrointestinal:  Denies nausea, heartburn or change in bowel habits Skin: Denies abnormal skin rashes Lymphatics: Denies  new lymphadenopathy or easy bruising Neurological:Denies numbness, tingling or new weaknesses Behavioral/Psych: Mood is stable, no new changes  All other systems were reviewed with the patient and are negative.  PHYSICAL EXAMINATION: ECOG PERFORMANCE STATUS: 0 - Asymptomatic  Filed Vitals:   10/22/14 1502  BP: 106/65  Pulse: 83  Temp: 99.3 F (37.4 C)  Resp: 18   Filed Weights   10/22/14 1502  Weight: 209 lb 14.4 oz (95.21 kg)    GENERAL:alert, no distress and comfortable SKIN: skin color, texture, turgor are normal, no rashes or significant lesions EYES: normal, conjunctiva are pink and non-injected, sclera clear OROPHARYNX:no exudate, no erythema and lips, buccal mucosa, and tongue normal  NECK: supple, thyroid normal size, non-tender, without nodularity LYMPH:  no palpable lymphadenopathy in the cervical, axillary or inguinal LUNGS: clear to auscultation and percussion with normal breathing effort HEART: regular rate & rhythm and no murmurs and no lower extremity edema ABDOMEN:abdomen soft, non-tender and normal bowel sounds Musculoskeletal:no cyanosis of digits and no clubbing  PSYCH: alert & oriented x 3 with fluent speech NEURO: no focal motor/sensory deficits  LABORATORY DATA:  I have reviewed the data as listed Lab Results  Component Value Date   WBC 7.9 09/28/2014   HGB 17.7* 09/28/2014   HCT 52.7* 09/28/2014   MCV 85.3 09/28/2014   PLT 139* 09/28/2014   Lab Results  Component Value Date   NA 134* 10/05/2014   K 4.5 10/05/2014   CL 103 10/05/2014   CO2 24 10/05/2014    RADIOGRAPHIC STUDIES: I have personally reviewed the radiological reports and agreed with the findings in the report.  ASSESSMENT AND PLAN:  Polycythemia Hb 17.7gm: Differential diagnosis between primary versus secondary. I reviewed his entire blood work from prior. All of his symptoms appear to arise as March 2016. His hemoglobins have fluctuated between 16.5 and 17.7. Iron studies did  not show any evidence of iron overload. Since March she has been having problems with cardiomyopathy. He is also getting a workup for obstructive sleep apnea.  Differential diagnosis: Secondary polycythemia which includes conditions that affect the heart, lungs, hypoxia driven in factors like altitude, athletic people, obstructive sleep apnea  Recommendation: 1. Blood work with JAK 2 mutation, erythropoietin level and a CBCD 2. Return to clinic in 3 weeks for follow-up to discuss the results of the blood work.  If it is secondary polycythemia there is no role of phlebotomy. If it is primary polycythemia there is a role of phlebotomy if his hemoglobin crosses 20 or he becomes symptomatic. I discussed with him some symptoms of polycythemia which include headaches blurred vision fatigue shortness of breath and strokelike symptoms.  Thrombocytopenia:his lowest platelet count is 110. His platelet counts are improving slowly over time. It's up to 139. We can continue to watch and monitor this.  All questions were answered. The  patient knows to call the clinic with any problems, questions or concerns.    Rulon Eisenmenger, MD 4:35 PM

## 2014-10-22 NOTE — Progress Notes (Signed)
Checked in new pt with no financial concerns prior to seeing the dr.  Pt has my card for any billing questions or concerns. ° °

## 2014-10-24 ENCOUNTER — Other Ambulatory Visit (HOSPITAL_BASED_OUTPATIENT_CLINIC_OR_DEPARTMENT_OTHER): Payer: BLUE CROSS/BLUE SHIELD

## 2014-10-24 DIAGNOSIS — D751 Secondary polycythemia: Secondary | ICD-10-CM

## 2014-10-24 LAB — CBC & DIFF AND RETIC
BASO%: 0.2 % (ref 0.0–2.0)
BASOS ABS: 0 10*3/uL (ref 0.0–0.1)
EOS ABS: 0.1 10*3/uL (ref 0.0–0.5)
EOS%: 1.9 % (ref 0.0–7.0)
HCT: 52.3 % — ABNORMAL HIGH (ref 38.4–49.9)
HEMOGLOBIN: 17.2 g/dL — AB (ref 13.0–17.1)
IMMATURE RETIC FRACT: 16.5 % — AB (ref 3.00–10.60)
LYMPH#: 2.7 10*3/uL (ref 0.9–3.3)
LYMPH%: 42.4 % (ref 14.0–49.0)
MCH: 28.4 pg (ref 27.2–33.4)
MCHC: 32.9 g/dL (ref 32.0–36.0)
MCV: 86.4 fL (ref 79.3–98.0)
MONO#: 0.5 10*3/uL (ref 0.1–0.9)
MONO%: 8.3 % (ref 0.0–14.0)
NEUT#: 3 10*3/uL (ref 1.5–6.5)
NEUT%: 47.2 % (ref 39.0–75.0)
Platelets: 111 10*3/uL — ABNORMAL LOW (ref 140–400)
RBC: 6.05 10*6/uL — ABNORMAL HIGH (ref 4.20–5.82)
RDW: 15.5 % — ABNORMAL HIGH (ref 11.0–14.6)
Retic %: 1.47 % (ref 0.80–1.80)
Retic Ct Abs: 88.94 10*3/uL (ref 34.80–93.90)
WBC: 6.3 10*3/uL (ref 4.0–10.3)

## 2014-10-24 LAB — TECHNOLOGIST REVIEW

## 2014-10-26 LAB — ERYTHROPOIETIN: Erythropoietin: 18.6 m[IU]/mL — ABNORMAL HIGH (ref 2.6–18.5)

## 2014-11-08 ENCOUNTER — Encounter: Payer: Self-pay | Admitting: Internal Medicine

## 2014-11-08 ENCOUNTER — Ambulatory Visit (INDEPENDENT_AMBULATORY_CARE_PROVIDER_SITE_OTHER): Payer: BLUE CROSS/BLUE SHIELD | Admitting: Internal Medicine

## 2014-11-08 VITALS — BP 114/88 | HR 80 | Ht 71.0 in | Wt 214.7 lb

## 2014-11-08 DIAGNOSIS — I428 Other cardiomyopathies: Secondary | ICD-10-CM

## 2014-11-08 DIAGNOSIS — I5021 Acute systolic (congestive) heart failure: Secondary | ICD-10-CM

## 2014-11-08 DIAGNOSIS — I5042 Chronic combined systolic (congestive) and diastolic (congestive) heart failure: Secondary | ICD-10-CM | POA: Diagnosis not present

## 2014-11-08 DIAGNOSIS — I10 Essential (primary) hypertension: Secondary | ICD-10-CM

## 2014-11-08 DIAGNOSIS — Z9889 Other specified postprocedural states: Secondary | ICD-10-CM | POA: Diagnosis not present

## 2014-11-08 DIAGNOSIS — I429 Cardiomyopathy, unspecified: Secondary | ICD-10-CM | POA: Diagnosis not present

## 2014-11-08 DIAGNOSIS — E785 Hyperlipidemia, unspecified: Secondary | ICD-10-CM

## 2014-11-08 NOTE — Patient Instructions (Signed)
Your physician has requested that you have an echocardiogram in 3 months. Echocardiography is a painless test that uses sound waves to create images of your heart. It provides your doctor with information about the size and shape of your heart and how well your heart's chambers and valves are working. This procedure takes approximately one hour. There are no restrictions for this procedure.  Your physician recommends that you schedule a follow-up appointment in 3 months, after your echocardiogram

## 2014-11-08 NOTE — Progress Notes (Signed)
OFFICE NOTE  Chief Complaint:  Hospital follow-up  Primary Care Physician: Cathlean Cower, MD  HPI:  Matthew Guerrero is a 59 y.o. male with a hx of HTN, HL, gout, asthma. He was seen in the emergency room in late March with abdominal pain and vomiting. ECG was abnormal and a fast scan demonstrated no pericardial effusion but global hypokinesis. Plan was to follow-up as an outpatient. However, he presented back to the emergency room with progressive shortness of breath and chest pain with exertion. Admitted 4/5-4/8 with acute systolic HF. EF was noted to be 15-20% on echo. Troponin was minimally elevated (0.14). He was diuresed. LHC was arranged and demonstrated normal coronary arteries. He was dx with NICM. Medications were adjusted for CHF and he returns for FU. Of note, ACE inhibitor was stopped and he was placed on Entresto.   Since DC, he is doing well. His breathing is improved. He is NYHA 2b. He denies orthopnea, PND, edema. He denies chest pain, syncope. Does admit to snoring and does take daytime naps. He has a non-productive cough.    Studies/Reports Reviewed Today:  Echo 09/27/14 - Mild LVH. EF 15% to 20%. Diffuse hypokinesis. There is akinesis of the entireanteroseptal myocardium. Grade 1 diastolic dysfunction. There is muscular trabeculation at apex of left ventricle (no obvious thrombus identified). Definity contrast used. - Mitral valve: There was mild regurgitation. - Left atrium: The atrium was moderately dilated. - Right ventricle: The cavity size was moderately dilated. Wall thickness was normal. - Right atrium: The atrium was mildly dilated.  LHC 09/27/14 The left main coronary artery is normal. The left anterior descending artery is normal. The left circumflex artery is normal. The right coronary artery is dominant and normal. EF: 15%.  Mr. Bethea has an upcoming sleep study which should be helpful hopefully and may be contributing to his  erythrocytosis. I suspect the etiology of his heart failure is hypertensive heart disease. There remains to be seen whether he'll have improvement in LV function. He seems to be tolerating his medications at this point. He does get somewhat fatigued with walking up stairs and doing activities. I suspect a benefit from cardiac rehabilitation.  PMHx:  Past Medical History  Diagnosis Date  . ALLERGIC RHINITIS   . GOUT   . HYPERLIPIDEMIA   . HYPERTENSION   . Asthma   . Cervical radiculitis   . Lateral epicondylitis of right elbow   . Hyperglycemia 08/28/2014  . S/P cardiac cath wjith normal coronary arteries  09/28/2014    Past Surgical History  Procedure Laterality Date  . Colonoscopy    . Left and right heart catheterization with coronary angiogram N/A 09/27/2014    Procedure: LEFT AND RIGHT HEART CATHETERIZATION WITH CORONARY ANGIOGRAM;  Surgeon: Belva Crome, MD;  Location: [facility] CATH LAB;  Service: Cardiovascular;  Laterality: N/A;    FAMHx:  Family History  Problem Relation Age of Onset  . Cancer Mother   . Diabetes Father   . Heart disease Father   . Colon cancer Neg Hx   . Esophageal cancer Neg Hx   . Rectal cancer Neg Hx   . Stomach cancer Neg Hx     SOCHx:   reports that he has never smoked. He has never used smokeless tobacco. He reports that he does not drink alcohol or use illicit drugs.  ALLERGIES:  No Known Allergies  ROS: A comprehensive review of systems was negative except for: Respiratory: positive for dyspnea on exertion  HOME  MEDS: Current Outpatient Prescriptions  Medication Sig Dispense Refill  . aspirin EC 81 MG EC tablet Take 1 tablet (81 mg total) by mouth daily.    . carvedilol (COREG) 25 MG tablet Take 1 tablet (25 mg total) by mouth 2 (two) times daily with a meal. 60 tablet 11  . colchicine 0.6 MG tablet Take 1 tablet (0.6 mg total) by mouth daily. 90 tablet 1  . cyclobenzaprine (FLEXERIL) 10 MG tablet Take 1 tablet (10 mg total) by mouth 2 (two)  times daily as needed for muscle spasms. 20 tablet 0  . furosemide (LASIX) 40 MG tablet Take 1 tablet (40 mg total) by mouth daily. 30 tablet 6  . ondansetron (ZOFRAN ODT) 4 MG disintegrating tablet 1 tab q6h PRN nausea/vomiting 10 tablet 0  . sacubitril-valsartan (ENTRESTO) 49-51 MG Take 1 tablet by mouth 2 (two) times daily. 28 tablet 0  . traMADol (ULTRAM) 50 MG tablet Take 1 tablet (50 mg total) by mouth every 8 (eight) hours as needed. 270 tablet 1   No current facility-administered medications for this visit.    LABS/IMAGING: No results found for this or any previous visit (from the past 48 hour(s)). No results found.  WEIGHTS: Wt Readings from Last 3 Encounters:  11/08/14 214 lb 11.2 oz (97.387 kg)  10/22/14 209 lb 14.4 oz (95.21 kg)  10/05/14 205 lb 9.6 oz (93.26 kg)    VITALS: BP 114/88 mmHg  Pulse 80  Ht 5\' 11"  (1.803 m)  Wt 214 lb 11.2 oz (97.387 kg)  BMI 29.96 kg/m2  EXAM: General appearance: alert and no distress Neck: no carotid bruit and no JVD Lungs: clear to auscultation bilaterally Heart: regular rate and rhythm, S1, S2 normal, no murmur, click, rub or gallop Abdomen: soft, non-tender; bowel sounds normal; no masses,  no organomegaly Extremities: extremities normal, atraumatic, no cyanosis or edema Pulses: 2+ and symmetric Skin: Skin color, texture, turgor normal. No rashes or lesions Neurologic: Grossly normal  EKG: Deferred  ASSESSMENT: 1. Nonischemic, probably hypertensive cardiomyopathy, EF 20% with normal coronaries-NYHA class II symptoms 2. Hypertension-controlled 3. Dyslipidemia 4. Possible structures sleep apnea 5. Gout  PLAN: 1.   Mr. Blanton is doing well on his heart failure medications. Interestingly was on high doses of carvedilol and valsartan and prior to his diagnosis, therefore he was switched to [facility]. We may consider adding Aldactone if his EF does not improve in the next few months. I'm recommending cardiac rehabilitation to  see if this can help strengthen his heart. We'll repeat an echo in 3 months and if his EF is not improved, he may be a candidate for an AICD per primary criteria for prevention of sudden cardiac death.  Pixie Casino, MD, [facility] Attending Cardiologist [place] 11/08/2014, 10:44 AM

## 2014-11-09 ENCOUNTER — Ambulatory Visit: Payer: BLUE CROSS/BLUE SHIELD | Admitting: Internal Medicine

## 2014-11-12 ENCOUNTER — Telehealth: Payer: Self-pay | Admitting: Internal Medicine

## 2014-11-12 NOTE — Telephone Encounter (Signed)
Patient reports he had given Kathleen Argue, PA FMLA paperwork to complete, which he did, but he stated patient could return to work NOW. Dr. Debara Pickett completed ST disability paperwork and said patient should be out of work 6 months. Patient reports his place of employment needs a letter explaining that he will need to be out of work.   Can be faxed to patient: [phone]

## 2014-11-12 NOTE — Telephone Encounter (Signed)
Letter faxed to patient.

## 2014-11-12 NOTE — Telephone Encounter (Signed)
Please call asap,concerning him returning to work.

## 2014-11-14 ENCOUNTER — Telehealth: Payer: Self-pay | Admitting: Hematology and Oncology

## 2014-11-14 ENCOUNTER — Ambulatory Visit (HOSPITAL_BASED_OUTPATIENT_CLINIC_OR_DEPARTMENT_OTHER): Payer: BLUE CROSS/BLUE SHIELD | Admitting: Hematology and Oncology

## 2014-11-14 VITALS — BP 117/74 | HR 66 | Temp 98.0°F | Resp 18 | Ht 71.0 in | Wt 214.5 lb

## 2014-11-14 DIAGNOSIS — D751 Secondary polycythemia: Secondary | ICD-10-CM | POA: Diagnosis not present

## 2014-11-14 DIAGNOSIS — D696 Thrombocytopenia, unspecified: Secondary | ICD-10-CM | POA: Diagnosis not present

## 2014-11-14 NOTE — Progress Notes (Signed)
Patient Care Team: Biagio Borg, MD as PCP - General  DIAGNOSIS: Secondary polycythemia, thrombocytopenia suspicious for ITP.  CHIEF COMPLIANT: Follow-up to discuss the lab tests  INTERVAL HISTORY: Matthew Guerrero is a 59-year-old with above-mentioned history of elevated hemoglobin and underlying cardiomyopathy presented with a hemoglobin of 17 and a platelet count of 120 to 130. He had blood testing for JAK2 mutation testing as well as erythropoietin is here today to discuss the results. Overall his shortness of breath is slowly improving with the help of cardiology.   REVIEW OF SYSTEMS:   Constitutional: Denies fevers, chills or abnormal weight loss Eyes: Denies blurriness of vision Ears, nose, mouth, throat, and face: Denies mucositis or sore throat Respiratory: Denies cough, dyspnea or wheezes Cardiovascular: Denies palpitation, chest discomfort or lower extremity swelling Gastrointestinal:  Denies nausea, heartburn or change in bowel habits Skin: Denies abnormal skin rashes Lymphatics: Denies new lymphadenopathy or easy bruising Neurological:Denies numbness, tingling or new weaknesses Behavioral/Psych: Mood is stable, no new changes   All other systems were reviewed with the patient and are negative.  I have reviewed the past medical history, past surgical history, social history and family history with the patient and they are unchanged from previous note.  ALLERGIES:  has No Known Allergies.  MEDICATIONS:  Current Outpatient Prescriptions  Medication Sig Dispense Refill  . aspirin EC 81 MG EC tablet Take 1 tablet (81 mg total) by mouth daily.    . carvedilol (COREG) 25 MG tablet Take 1 tablet (25 mg total) by mouth 2 (two) times daily with a meal. 60 tablet 11  . colchicine 0.6 MG tablet Take 1 tablet (0.6 mg total) by mouth daily. 90 tablet 1  . furosemide (LASIX) 40 MG tablet Take 1 tablet (40 mg total) by mouth daily. 30 tablet 6  . sacubitril-valsartan (ENTRESTO) 49-51  MG Take 1 tablet by mouth 2 (two) times daily. 28 tablet 0  . traMADol (ULTRAM) 50 MG tablet Take 1 tablet (50 mg total) by mouth every 8 (eight) hours as needed. 270 tablet 1  . cyclobenzaprine (FLEXERIL) 10 MG tablet Take 1 tablet (10 mg total) by mouth 2 (two) times daily as needed for muscle spasms. (Patient not taking: Reported on 11/14/2014) 20 tablet 0   No current facility-administered medications for this visit.    PHYSICAL EXAMINATION: ECOG PERFORMANCE STATUS: 1 - Symptomatic but completely ambulatory  Filed Vitals:   11/14/14 0803  BP: 117/74  Pulse: 66  Temp: 98 F (36.7 C)  Resp: 18   Filed Weights   11/14/14 0803  Weight: 214 lb 8 oz (97.297 kg)    GENERAL:alert, no distress and comfortable SKIN: skin color, texture, turgor are normal, no rashes or significant lesions EYES: normal, Conjunctiva are pink and non-injected, sclera clear OROPHARYNX:no exudate, no erythema and lips, buccal mucosa, and tongue normal  NECK: supple, thyroid normal size, non-tender, without nodularity LYMPH:  no palpable lymphadenopathy in the cervical, axillary or inguinal LUNGS: clear to auscultation and percussion with normal breathing effort HEART: regular rate & rhythm and no murmurs and no lower extremity edema ABDOMEN:abdomen soft, non-tender and normal bowel sounds Musculoskeletal:no cyanosis of digits and no clubbing  NEURO: alert & oriented x 3 with fluent speech, no focal motor/sensory deficits  LABORATORY DATA:  I have reviewed the data as listed   Chemistry      Component Value Date/Time   NA 134* 10/05/2014 1047   K 4.5 10/05/2014 1047   CL 103 10/05/2014 1047  CO2 24 10/05/2014 1047   BUN 12 10/05/2014 1047   CREATININE 1.15 10/05/2014 1047      Component Value Date/Time   CALCIUM 9.7 10/05/2014 1047   ALKPHOS 36* 09/26/2014 0305   AST 26 09/26/2014 0305   ALT 33 09/26/2014 0305   BILITOT 1.2 09/26/2014 0305       Lab Results  Component Value Date   WBC  6.3 10/24/2014   HGB 17.2* 10/24/2014   HCT 52.3* 10/24/2014   MCV 86.4 10/24/2014   PLT 111 Large platelets present* 10/24/2014   NEUTROABS 3.0 10/24/2014   The stomach ASSESSMENT & PLAN:  Erythrocytosis and decreased platelet count. Secondary Polycythemia: Negative JAK2 V617F mutation. Erythropoeitin 18.6 (mildly elevated) 10/26/14 Recommendation: 1. Most likely related to underlying cardiac disease 2. Phlebotomy is not the standard of care for secondary polycythemia. However if his Hb crosses 20, we can consider doing it  Thrombocytopenia: Suspicious for low grade ITP (Large platelets on smear suggest increased destruction) Can be monitored every 3 months     No orders of the defined types were placed in this encounter.   The patient has a good understanding of the overall plan. he agrees with it. he will call with any problems that may develop before the next visit here.   Rulon Eisenmenger, MD

## 2014-11-14 NOTE — Telephone Encounter (Signed)
Appointments made and avs printed for patient °

## 2014-11-14 NOTE — Assessment & Plan Note (Signed)
Secondary Polycythemia: Negative JAK2 V617F mutation. Erythropoeitin 18.6 (mildly elevated) 10/26/14 Recommendation: 1. Most likely related to underlying cardiac disease 2. Phlebotomy is not the standard of care for secondary polycythemia. However if his Hb crosses 20, we can consider doing it  Thrombocytopenia: Suspicious for low grade ITP (Large platelets on smear suggest increased destruction) Can be monitored every 3 months

## 2014-11-15 ENCOUNTER — Other Ambulatory Visit: Payer: Self-pay | Admitting: *Deleted

## 2014-11-15 ENCOUNTER — Telehealth: Payer: Self-pay | Admitting: Internal Medicine

## 2014-11-15 DIAGNOSIS — I5030 Unspecified diastolic (congestive) heart failure: Secondary | ICD-10-CM

## 2014-11-15 NOTE — Telephone Encounter (Signed)
Pt called in wanting to know when Dr. Debara Pickett wanted him to start cardiac rehab. Please call  Thanks

## 2014-11-15 NOTE — Telephone Encounter (Signed)
Referral sent to Cardiac rehab, pt. Called and informed

## 2014-11-15 NOTE — Telephone Encounter (Signed)
I recommended it at my last office visit. He can start now.  Dr. Lemmie Evens

## 2014-11-15 NOTE — Telephone Encounter (Signed)
When can pt. Start cardiac rehab

## 2014-11-16 ENCOUNTER — Telehealth: Payer: Self-pay | Admitting: Internal Medicine

## 2014-11-16 NOTE — Telephone Encounter (Signed)
Returned call to patient and notified him that cardiac rehab will contact him to set up his rehab schedule. Staff message sent to Barnet Pall, RN regarding this.

## 2014-11-16 NOTE — Telephone Encounter (Signed)
Please call,have some question about the Rehab that Dr Debara Pickett was talking about.

## 2014-11-20 ENCOUNTER — Telehealth: Payer: Self-pay | Admitting: Internal Medicine

## 2014-11-20 ENCOUNTER — Telehealth (HOSPITAL_COMMUNITY): Payer: Self-pay | Admitting: *Deleted

## 2014-11-20 NOTE — Telephone Encounter (Signed)
Matthew Guerrero is calling because he is supposed to Cardiac Rehab and he is stating there should be no problems with his insurance , because the new calendar year starts on tomorrow. Please call if you have any more questions .[name] Thanks

## 2014-11-20 NOTE — Telephone Encounter (Signed)
Spoke with patient and informed him of info Verdis Frederickson, RN with cardiac rehab had already told him.[name]    ----- Message -----   From: Magda Kiel, RN   Sent: 11/20/2014  3:57 PM    To: Fidel Levy, RN  Subject: cardaic rehab                   Good afternoon Matthew Guerrero,   I checked with [employer] for Matthew Guerrero. Matthew Guerrero calender year starts tomorrow, he will have a 5,000 deductible. Matthew Pape says he cannot afford to do the monitored or the maintenance program. Matthew Guerrero says he is out of work until October and has a lot of financial obligations at this time.   Thanks   Francena Hanly he will have a new deductible starting tomorrow and insurance usually does not pay for things until deductible has been met. He voiced understanding.

## 2014-11-21 ENCOUNTER — Telehealth: Payer: Self-pay | Admitting: Internal Medicine

## 2014-11-21 NOTE — Telephone Encounter (Signed)
Faxed order for cardiac rehab - no GXT required - to [phone]

## 2014-11-21 NOTE — Telephone Encounter (Signed)
Please call,concerning the discussion you had yesterday about Cardiac Rehab.

## 2014-11-21 NOTE — Telephone Encounter (Signed)
Patient called in to say his deductible is only $2500 and he will start cardiac rehab and use the payment plan. He has spoken with Barnet Pall, RN regarding this as well earlier today

## 2014-11-28 ENCOUNTER — Telehealth: Payer: Self-pay | Admitting: *Deleted

## 2014-11-28 NOTE — Telephone Encounter (Signed)
Faxed documentation to [employer] regarding patient's disability.   During 8 hour day patient can: Sit - no more than 8 hours at a time for total of 8 hours/day Stand - no more than 1 hour at a time for total of 1 hour/day Walk - no more than zero hours at a time for NO hours per day Lift/carry restrictions - less than 10 lbs occasionally Push pull restrictions - less than 10 lbs occasionally These restrictions applicable for 6 months Can return to work now with limited duty

## 2014-11-29 ENCOUNTER — Encounter (HOSPITAL_COMMUNITY)
Admission: RE | Admit: 2014-11-29 | Discharge: 2014-11-29 | Disposition: A | Discharge status: Home / Self Care | Payer: BLUE CROSS/BLUE SHIELD | Source: Ambulatory Visit | Attending: Internal Medicine | Admitting: Internal Medicine

## 2014-11-29 DIAGNOSIS — I509 Heart failure, unspecified: Secondary | ICD-10-CM | POA: Insufficient documentation

## 2014-11-29 NOTE — Progress Notes (Signed)
Cardiac Rehab Medication Review by a Pharmacist  Does the patient  feel that his/her medications are working for him/her?  yes  Has the patient been experiencing any side effects to the medications prescribed?  no  Does the patient measure his/her own blood pressure or blood glucose at home?  yes - occasionally  Does the patient have any problems obtaining medications due to transportation or finances?   no  Understanding of regimen: good Understanding of indications: good Potential of compliance: excellent    Pharmacist comments: Patient has no issues with medications. Reports good understanding of regimen and excellent compliance. No other issues.   Elicia Lamp, PharmD Clinical Pharmacist - Resident Pager [phone] 11/29/2014 8:05 AM

## 2014-12-03 ENCOUNTER — Encounter (HOSPITAL_COMMUNITY)
Admission: RE | Admit: 2014-12-03 | Discharge: 2014-12-03 | Disposition: A | Discharge status: Home / Self Care | Payer: BLUE CROSS/BLUE SHIELD | Source: Ambulatory Visit | Attending: Internal Medicine | Admitting: Internal Medicine

## 2014-12-03 DIAGNOSIS — I509 Heart failure, unspecified: Secondary | ICD-10-CM | POA: Diagnosis not present

## 2014-12-03 NOTE — Progress Notes (Signed)
Pt started cardiac rehab today in the 6:45 exercise class for phase II.  Pt tolerated light exercise without difficulty. VSS, telemetry SR with slight st depression and noted bundle beats occasional in nature. This is present on his most recent 12 lead ekg., asymptomatic.  Medication list reconciled.  Pt verbalized compliance with medications and denies barriers to compliance. PSYCHOSOCIAL ASSESSMENT:  PHQ-0. Pt exhibits positive coping skills, hopeful outlook. Pt receives great support spiritual.  Attending church is very important to this patient.  Pt joined a church on yesterday and feels he has the support of fellow church members. No psychosocial needs identified at this time, no psychosocial interventions necessary.    Pt enjoys attending church and participating in [place].   Pt cardiac rehab  goal is  to build heart muscle. Pt will have an echocardiogram in a few month.  He is extremely hopeful that there will be an improvement of his EF.  Pt plans to participate in rehab to help him achieve this goal.  Pt encouraged to participate in home exercise consistently and attend educational classes offered on Monday and Wednesday to increase ability to achieve these goals.   Pt long term cardiac rehab goal is get where used to be.  Pt defines this as being able to perform daily activities with no shortness of breath. Will periodically check in with pt  assess pulse oximetry and tolerance to workload increases. Pt oriented to exercise equipment and routine.  Understanding verbalized. Cherre Huger, BSN

## 2014-12-05 ENCOUNTER — Encounter (HOSPITAL_COMMUNITY)
Admission: RE | Admit: 2014-12-05 | Discharge: 2014-12-05 | Disposition: A | Discharge status: Home / Self Care | Payer: BLUE CROSS/BLUE SHIELD | Source: Ambulatory Visit | Attending: Internal Medicine | Admitting: Internal Medicine

## 2014-12-05 DIAGNOSIS — I509 Heart failure, unspecified: Secondary | ICD-10-CM | POA: Diagnosis not present

## 2014-12-07 ENCOUNTER — Encounter (HOSPITAL_COMMUNITY)
Admission: RE | Admit: 2014-12-07 | Discharge: 2014-12-07 | Disposition: A | Discharge status: Home / Self Care | Payer: BLUE CROSS/BLUE SHIELD | Source: Ambulatory Visit | Attending: Internal Medicine | Admitting: Internal Medicine

## 2014-12-07 DIAGNOSIS — I509 Heart failure, unspecified: Secondary | ICD-10-CM | POA: Diagnosis not present

## 2014-12-07 NOTE — Progress Notes (Signed)
QUALITY OF LIFE SCORE REVIEW Patient completed QOL survey as a participant in Cardiac Rehab.Patient within normal limits.   Pt scores are as follows: Overall 27.09, Health and Function 26.70, socioeconomic 25.69, Psychological and Spiritual 30.00 and Family 26.40.  Scores less than 21 are considered low. No needs identified at this time, No further intervention is needed. Pt is looking forward to attending church on Sunday where he will receive the right hand of fellowship. Pt observed engaging in conversation that involve laughter with fellow participants.  Will continue to monitor and intervene as necessary. Cherre Huger, BSN

## 2014-12-10 ENCOUNTER — Encounter (HOSPITAL_COMMUNITY)
Admission: RE | Admit: 2014-12-10 | Discharge: 2014-12-10 | Disposition: A | Discharge status: Home / Self Care | Payer: BLUE CROSS/BLUE SHIELD | Source: Ambulatory Visit | Attending: Internal Medicine | Admitting: Internal Medicine

## 2014-12-10 DIAGNOSIS — I509 Heart failure, unspecified: Secondary | ICD-10-CM | POA: Diagnosis not present

## 2014-12-12 ENCOUNTER — Encounter (HOSPITAL_COMMUNITY)
Admission: RE | Admit: 2014-12-12 | Discharge: 2014-12-12 | Disposition: A | Discharge status: Home / Self Care | Payer: BLUE CROSS/BLUE SHIELD | Source: Ambulatory Visit | Attending: Internal Medicine | Admitting: Internal Medicine

## 2014-12-12 DIAGNOSIS — I509 Heart failure, unspecified: Secondary | ICD-10-CM | POA: Diagnosis not present

## 2014-12-13 ENCOUNTER — Ambulatory Visit (HOSPITAL_BASED_OUTPATIENT_CLINIC_OR_DEPARTMENT_OTHER): Payer: BLUE CROSS/BLUE SHIELD | Attending: Physician Assistant | Admitting: Radiology

## 2014-12-13 VITALS — Ht 70.0 in | Wt 205.0 lb

## 2014-12-13 DIAGNOSIS — R0683 Snoring: Secondary | ICD-10-CM | POA: Diagnosis not present

## 2014-12-13 DIAGNOSIS — G4719 Other hypersomnia: Secondary | ICD-10-CM

## 2014-12-13 DIAGNOSIS — I493 Ventricular premature depolarization: Secondary | ICD-10-CM | POA: Diagnosis not present

## 2014-12-13 DIAGNOSIS — I491 Atrial premature depolarization: Secondary | ICD-10-CM | POA: Diagnosis not present

## 2014-12-13 DIAGNOSIS — G4733 Obstructive sleep apnea (adult) (pediatric): Secondary | ICD-10-CM

## 2014-12-14 ENCOUNTER — Encounter (HOSPITAL_COMMUNITY): Payer: BLUE CROSS/BLUE SHIELD

## 2014-12-16 ENCOUNTER — Telehealth: Payer: Self-pay | Admitting: Cardiology

## 2014-12-16 DIAGNOSIS — G4733 Obstructive sleep apnea (adult) (pediatric): Secondary | ICD-10-CM | POA: Insufficient documentation

## 2014-12-16 DIAGNOSIS — G4719 Other hypersomnia: Secondary | ICD-10-CM | POA: Insufficient documentation

## 2014-12-16 NOTE — Sleep Study (Signed)
   NAME: Matthew Guerrero DATE OF BIRTH:  01/05/1956 MEDICAL RECORD NUMBER [id number]  LOCATION: [facility]  PHYSICIAN: TURNER,TRACI R  DATE OF STUDY: 12/13/2014  SLEEP STUDY TYPE: Nocturnal Polysomnogram               REFERRING PHYSICIAN: Richardson Dopp T, PA-C  INDICATION FOR STUDY: snoring, morning headaches, excessive daytime sleepiness  EPWORTH SLEEPINESS SCORE: 8 HEIGHT: 5\' 10"  (177.8 cm)  WEIGHT: 205 lb (92.987 kg)    Body mass index is 29.41 kg/(m^2).  NECK SIZE: 18 in.  MEDICATIONS: Reviewed in the chart  SLEEP ARCHITECTURE: The patient slept for a total of 319 minutes out of a total sleep period time of 361 minutes.  There was no slow wave sleep and 55 minutes or REM sleep.  The onset to sleep latency was prolonged at 56 minutes and the onset to REM sleep latency was normal at 74 minutes.  The sleep efficiency was reduced at 76%.    RESPIRATORY DATA: There was 37 apneas, of which, 18 were obstructive, 12 were central and 7 were mixed.  There were 85 hypopneas.  The total AHI was 23 events per hour consistent with moderate obstructive sleep apnea/hypopnea syndrome.  Most events occurred during NREM sleep in the supine position.  There was mild to moderate snoring.  OXYGEN DATA: The average oxygen saturation was 92%.  The lowest oxygen saturation was 71%.  The time spent with oxygen saturations < 88% was 21 minutes.    CARDIAC DATA: The patient maintained NSR with PVC's and PAC's.  The average heart rate was 70 bpm.  The lowest heart rate was 33 bpm.    MOVEMENT/PARASOMNIA: There were no periodic limb movement disorders or REM sleep behavior disorders.  IMPRESSION/ RECOMMENDATION:   1.  Moderate obstructive sleep apnea/hypopnea syndrome with an AHI of 23 events per hour. Most events occurred during NREM sleep in the supine position. 2.  There was mild to moderate snoring. 3.  Reduced sleep efficiency with increased frequency of arousals due to respiratory  events.   4.  Abnormal sleep architecture with no slow wave sleep. 5.  PVC's and PAC's were noted during the study. 6.  Oxygen desaturations associated with sleep disordered breathing.  The lowest oxygen saturation was 71%.  The time spent with oxygen saturations < 88% was 21 minutes.   7.  Given the degree of sleep disordered breathing with oxygen desaturations, CPAP titration would be appropriate.   8.  Treatment would also include careful attention to proper sleep hygiene,  avoidance of sleeping in the supine position and avoidance of alcohol within four hours of bedtime.  Specific treatment decisions should be tailored to each patient based upon the clinical situation and all treatment options should be considered.  The patient should be instructed to avoid driving if sleepy and careful clinical follow up is needed to ensure that the patient's symptoms are improving with therapy and the PAP adherence is supported and measured if prescribed.    Signed;  Sueanne Margarita Diplomate, American Board of Sleep Medicine  ELECTRONICALLY SIGNED ON:  12/16/2014, 7:12 PM [place] PH: [phone]   FX: [phone] [place]

## 2014-12-16 NOTE — Telephone Encounter (Signed)
Please let patient know that they have sleep apnea and recommend CPAP titration. Please set up titration in the sleep lab. 

## 2014-12-17 ENCOUNTER — Encounter (HOSPITAL_COMMUNITY)
Admission: RE | Admit: 2014-12-17 | Discharge: 2014-12-17 | Disposition: A | Discharge status: Home / Self Care | Payer: BLUE CROSS/BLUE SHIELD | Source: Ambulatory Visit | Attending: Internal Medicine | Admitting: Internal Medicine

## 2014-12-17 DIAGNOSIS — I509 Heart failure, unspecified: Secondary | ICD-10-CM | POA: Diagnosis not present

## 2014-12-17 NOTE — Addendum Note (Signed)
Addended by: Andres Ege on: 12/17/2014 02:34 PM   Modules accepted: Orders

## 2014-12-17 NOTE — Telephone Encounter (Signed)
Patient is aware of results. Titration has been scheduled, letter will be sent to patient to let him know the date.

## 2014-12-18 ENCOUNTER — Encounter: Payer: Self-pay | Admitting: *Deleted

## 2014-12-19 ENCOUNTER — Encounter (HOSPITAL_COMMUNITY)
Admission: RE | Admit: 2014-12-19 | Discharge: 2014-12-19 | Disposition: A | Discharge status: Home / Self Care | Payer: BLUE CROSS/BLUE SHIELD | Source: Ambulatory Visit | Attending: Internal Medicine | Admitting: Internal Medicine

## 2014-12-19 DIAGNOSIS — I509 Heart failure, unspecified: Secondary | ICD-10-CM | POA: Diagnosis not present

## 2014-12-21 ENCOUNTER — Encounter (HOSPITAL_COMMUNITY)
Admission: RE | Admit: 2014-12-21 | Discharge: 2014-12-21 | Disposition: A | Discharge status: Home / Self Care | Payer: BLUE CROSS/BLUE SHIELD | Source: Ambulatory Visit | Attending: Internal Medicine | Admitting: Internal Medicine

## 2014-12-21 DIAGNOSIS — I509 Heart failure, unspecified: Secondary | ICD-10-CM | POA: Insufficient documentation

## 2014-12-26 ENCOUNTER — Encounter (HOSPITAL_COMMUNITY)
Admission: RE | Admit: 2014-12-26 | Discharge: 2014-12-26 | Disposition: A | Discharge status: Home / Self Care | Payer: BLUE CROSS/BLUE SHIELD | Source: Ambulatory Visit | Attending: Internal Medicine | Admitting: Internal Medicine

## 2014-12-26 DIAGNOSIS — I509 Heart failure, unspecified: Secondary | ICD-10-CM | POA: Diagnosis not present

## 2014-12-26 NOTE — Progress Notes (Signed)
Reviewed home exercise with pt today.  Pt plans to walk 2-3 days in addition to CRPII for exercise.  Reviewed THR, pulse, RPE, sign and symptoms, and when to call 911 or MD.  Pt voiced understanding.     Farris Blash [employer]

## 2014-12-28 ENCOUNTER — Encounter (HOSPITAL_COMMUNITY)
Admission: RE | Admit: 2014-12-28 | Discharge: 2014-12-28 | Disposition: A | Discharge status: Home / Self Care | Payer: BLUE CROSS/BLUE SHIELD | Source: Ambulatory Visit | Attending: Internal Medicine | Admitting: Internal Medicine

## 2014-12-28 DIAGNOSIS — I509 Heart failure, unspecified: Secondary | ICD-10-CM | POA: Diagnosis not present

## 2014-12-31 ENCOUNTER — Encounter (HOSPITAL_COMMUNITY)
Admission: RE | Admit: 2014-12-31 | Discharge: 2014-12-31 | Disposition: A | Discharge status: Home / Self Care | Payer: BLUE CROSS/BLUE SHIELD | Source: Ambulatory Visit | Attending: Internal Medicine | Admitting: Internal Medicine

## 2014-12-31 DIAGNOSIS — I509 Heart failure, unspecified: Secondary | ICD-10-CM | POA: Diagnosis not present

## 2014-12-31 NOTE — Progress Notes (Signed)
Matthew Guerrero 59 y.o. male Nutrition Note Spoke with pt. Nutrition Plan and Nutrition Survey goals reviewed with pt. Pt is following Step 1 of the Therapeutic Lifestyle Changes diet. Pt wants to lose wt. Pt has been trying to lose wt by decreasing portion sizes consumed. Wt loss tips briefly reviewed. Pt is pre-diabetic according to pt's last A1c. Pt with dx of CHF. Per discussion, pt does not use canned/convenience foods often. Pt rarely adds salt to food. Pt expressed understanding of the information reviewed. Pt aware of nutrition education classes offered. Lab Results  Component Value Date   HGBA1C 6.4 08/24/2014   Nutrition Diagnosis ? Food-and nutrition-related knowledge deficit related to lack of exposure to information as related to diagnosis of: ? CVD ? Pre-DM ? Obesity related to excessive energy intake as evidenced by a BMI of 30.8  Nutrition RX/ Estimated Daily Nutrition Needs for: wt loss 1700-2200 Kcal, 45-60 gm fat, 11-15 gm sat fat, 1.6-2.2 gm trans-fat, <1500 mg sodium  Nutrition Intervention ? Pt's individual nutrition plan reviewed with pt. ? Benefits of adopting Therapeutic Lifestyle Changes discussed when Medficts reviewed. ? Pt to attend the Portion Distortion class  ? Pt given handouts for: ? Nutrition I class ? Nutrition II class ? pre-diabetes ? Continue client-centered nutrition education by RD, as part of interdisciplinary care. Goal(s) ? Pt to identify and limit food sources of saturated fat, trans fat, and cholesterol ? Pt to identify food quantities necessary to achieve: ? wt loss to a goal wt of 190-208 lb (86.6-94.8 kg) at graduation from cardiac rehab.  ? Pt able to name foods that affect blood glucose  Monitor and Evaluate progress toward nutrition goal with team. Nutrition Risk: Change to Moderate [name], M.Ed, RD, LDN, CDE 12/31/2014 8:15 AM

## 2015-01-02 ENCOUNTER — Encounter (HOSPITAL_COMMUNITY)
Admission: RE | Admit: 2015-01-02 | Discharge: 2015-01-02 | Disposition: A | Discharge status: Home / Self Care | Payer: BLUE CROSS/BLUE SHIELD | Source: Ambulatory Visit | Attending: Internal Medicine | Admitting: Internal Medicine

## 2015-01-02 DIAGNOSIS — I509 Heart failure, unspecified: Secondary | ICD-10-CM | POA: Diagnosis not present

## 2015-01-02 NOTE — Progress Notes (Signed)
  30 day Psychosocial followup assessment  Patient psychosocial assessment reveals no barriers to cardiac rehab participation.  Psychosocial areas that are affecting patient's rehab experience include concerns about returning back to work whether he will be able to continue to work.  Pt has applied for disability with IT trainer. Pt presently is able to meet his financial obligations in the household.  Pt has a room mate that also contributes to the utilities.Ptis currently receiving short term disability payments.  Patient  does continue to exhibit positive coping skills to deal with psychosocial concerns. Pt faith in God is his main support.  Pt has developed relationships in the church that he recently joined.  Pt is active in the [employer] and receives great pleasure.  Patient feels he is  making progress towards cardiac rehab goals.  Patient reports health and activity level greatly improved in the past 30 days as evidenced by increased MET level progression. Pt is consistent with his attendance to exercise as well as education classes.  Pt recently completed sleep study with positive results.  Pt will be fitted for CPAP mask in august. Patient reports feeling positive about current and projected progress toward cardiac rehab goals.  Patient's rate of progress towards goals is excellent.  Plan of action to help patient continue to work towards rehab goals include workload progression to increase stamina and endurance.  Adherence to home exercise on his off days of rehab will assist with shortness of breath.  Will continue to monitor and evaluate progress toward psychosocial goals. Goals in progress: Help patient work toward returning to meaningful activities that improve patient's quality of life and are attainable. Cherre Huger, BSN

## 2015-01-04 ENCOUNTER — Encounter (HOSPITAL_COMMUNITY): Admission: RE | Admit: 2015-01-04 | Payer: BLUE CROSS/BLUE SHIELD | Source: Ambulatory Visit

## 2015-01-04 ENCOUNTER — Encounter: Payer: Self-pay | Admitting: Gastroenterology

## 2015-01-07 ENCOUNTER — Encounter (HOSPITAL_COMMUNITY)
Admission: RE | Admit: 2015-01-07 | Discharge: 2015-01-07 | Disposition: A | Discharge status: Home / Self Care | Payer: BLUE CROSS/BLUE SHIELD | Source: Ambulatory Visit | Attending: Internal Medicine | Admitting: Internal Medicine

## 2015-01-07 DIAGNOSIS — I509 Heart failure, unspecified: Secondary | ICD-10-CM | POA: Diagnosis not present

## 2015-01-09 ENCOUNTER — Encounter (HOSPITAL_COMMUNITY)
Admission: RE | Admit: 2015-01-09 | Discharge: 2015-01-09 | Disposition: A | Discharge status: Home / Self Care | Payer: BLUE CROSS/BLUE SHIELD | Source: Ambulatory Visit | Attending: Internal Medicine | Admitting: Internal Medicine

## 2015-01-09 DIAGNOSIS — I509 Heart failure, unspecified: Secondary | ICD-10-CM | POA: Diagnosis not present

## 2015-01-11 ENCOUNTER — Encounter (HOSPITAL_COMMUNITY)
Admission: RE | Admit: 2015-01-11 | Discharge: 2015-01-11 | Disposition: A | Discharge status: Home / Self Care | Payer: BLUE CROSS/BLUE SHIELD | Source: Ambulatory Visit | Attending: Internal Medicine | Admitting: Internal Medicine

## 2015-01-11 DIAGNOSIS — I509 Heart failure, unspecified: Secondary | ICD-10-CM | POA: Diagnosis not present

## 2015-01-14 ENCOUNTER — Encounter (HOSPITAL_COMMUNITY)
Admission: RE | Admit: 2015-01-14 | Discharge: 2015-01-14 | Disposition: A | Discharge status: Home / Self Care | Payer: BLUE CROSS/BLUE SHIELD | Source: Ambulatory Visit | Attending: Internal Medicine | Admitting: Internal Medicine

## 2015-01-14 DIAGNOSIS — I509 Heart failure, unspecified: Secondary | ICD-10-CM | POA: Diagnosis not present

## 2015-01-16 ENCOUNTER — Encounter (HOSPITAL_COMMUNITY)
Admission: RE | Admit: 2015-01-16 | Discharge: 2015-01-16 | Disposition: A | Discharge status: Home / Self Care | Payer: BLUE CROSS/BLUE SHIELD | Source: Ambulatory Visit | Attending: Internal Medicine | Admitting: Internal Medicine

## 2015-01-16 DIAGNOSIS — I509 Heart failure, unspecified: Secondary | ICD-10-CM | POA: Diagnosis not present

## 2015-01-18 ENCOUNTER — Encounter (HOSPITAL_COMMUNITY)
Admission: RE | Admit: 2015-01-18 | Discharge: 2015-01-18 | Disposition: A | Discharge status: Home / Self Care | Payer: BLUE CROSS/BLUE SHIELD | Source: Ambulatory Visit | Attending: Internal Medicine | Admitting: Internal Medicine

## 2015-01-18 DIAGNOSIS — I509 Heart failure, unspecified: Secondary | ICD-10-CM | POA: Diagnosis not present

## 2015-01-21 ENCOUNTER — Encounter (HOSPITAL_COMMUNITY)
Admission: RE | Admit: 2015-01-21 | Discharge: 2015-01-21 | Disposition: A | Discharge status: Home / Self Care | Payer: BLUE CROSS/BLUE SHIELD | Source: Ambulatory Visit | Attending: Internal Medicine | Admitting: Internal Medicine

## 2015-01-21 DIAGNOSIS — I509 Heart failure, unspecified: Secondary | ICD-10-CM | POA: Insufficient documentation

## 2015-01-23 ENCOUNTER — Encounter (HOSPITAL_COMMUNITY)
Admission: RE | Admit: 2015-01-23 | Discharge: 2015-01-23 | Disposition: A | Discharge status: Home / Self Care | Payer: BLUE CROSS/BLUE SHIELD | Source: Ambulatory Visit | Attending: Internal Medicine | Admitting: Internal Medicine

## 2015-01-23 DIAGNOSIS — I509 Heart failure, unspecified: Secondary | ICD-10-CM | POA: Diagnosis not present

## 2015-01-25 ENCOUNTER — Encounter (HOSPITAL_COMMUNITY)
Admission: RE | Admit: 2015-01-25 | Discharge: 2015-01-25 | Disposition: A | Discharge status: Home / Self Care | Payer: BLUE CROSS/BLUE SHIELD | Source: Ambulatory Visit | Attending: Internal Medicine | Admitting: Internal Medicine

## 2015-01-25 DIAGNOSIS — I509 Heart failure, unspecified: Secondary | ICD-10-CM | POA: Diagnosis not present

## 2015-01-28 ENCOUNTER — Encounter (HOSPITAL_COMMUNITY)
Admission: RE | Admit: 2015-01-28 | Discharge: 2015-01-28 | Disposition: A | Discharge status: Home / Self Care | Payer: BLUE CROSS/BLUE SHIELD | Source: Ambulatory Visit | Attending: Internal Medicine | Admitting: Internal Medicine

## 2015-01-28 DIAGNOSIS — I509 Heart failure, unspecified: Secondary | ICD-10-CM | POA: Diagnosis not present

## 2015-01-30 ENCOUNTER — Encounter (HOSPITAL_COMMUNITY)
Admission: RE | Admit: 2015-01-30 | Discharge: 2015-01-30 | Disposition: A | Discharge status: Home / Self Care | Payer: BLUE CROSS/BLUE SHIELD | Source: Ambulatory Visit | Attending: Internal Medicine | Admitting: Internal Medicine

## 2015-01-30 DIAGNOSIS — I509 Heart failure, unspecified: Secondary | ICD-10-CM | POA: Diagnosis not present

## 2015-02-01 ENCOUNTER — Encounter (HOSPITAL_COMMUNITY)
Admission: RE | Admit: 2015-02-01 | Discharge: 2015-02-01 | Disposition: A | Discharge status: Home / Self Care | Payer: BLUE CROSS/BLUE SHIELD | Source: Ambulatory Visit | Attending: Internal Medicine | Admitting: Internal Medicine

## 2015-02-01 DIAGNOSIS — I509 Heart failure, unspecified: Secondary | ICD-10-CM | POA: Diagnosis not present

## 2015-02-01 NOTE — Progress Notes (Signed)
59 day Psychosocial Assessment  Patient psychosocial assessment reveals no barriers to cardiac rehab participation.Pt is awaiting to hear back regarding disability.  Pt is scheduled for echo next week.  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 Patient also assist his roommate with intravenous antbx therapy. Pt is consistent with his attendance to exercise as well as education classes. Patient's rate of progress towards goals is excellent. Plan of action to help patient continue to work towards rehab goals include workload progression to increase stamina and endurance. Adherence to home exercise on his off days of rehab. Will continue to monitor and evaluate progress toward psychosocial goals. Goals in progress: Help patient work toward returning to meaningful activities that improve patient's quality of life and are attainable. Cherre Huger, BSN

## 2015-02-04 ENCOUNTER — Encounter (HOSPITAL_COMMUNITY)
Admission: RE | Admit: 2015-02-04 | Discharge: 2015-02-04 | Disposition: A | Discharge status: Home / Self Care | Payer: BLUE CROSS/BLUE SHIELD | Source: Ambulatory Visit | Attending: Internal Medicine | Admitting: Internal Medicine

## 2015-02-04 DIAGNOSIS — I509 Heart failure, unspecified: Secondary | ICD-10-CM | POA: Diagnosis not present

## 2015-02-06 ENCOUNTER — Encounter (HOSPITAL_COMMUNITY)
Admission: RE | Admit: 2015-02-06 | Discharge: 2015-02-06 | Disposition: A | Discharge status: Home / Self Care | Payer: BLUE CROSS/BLUE SHIELD | Source: Ambulatory Visit | Attending: Internal Medicine | Admitting: Internal Medicine

## 2015-02-06 DIAGNOSIS — I509 Heart failure, unspecified: Secondary | ICD-10-CM | POA: Diagnosis not present

## 2015-02-08 ENCOUNTER — Ambulatory Visit (HOSPITAL_COMMUNITY): Payer: BLUE CROSS/BLUE SHIELD | Attending: Cardiology

## 2015-02-08 ENCOUNTER — Other Ambulatory Visit: Payer: Self-pay | Admitting: Internal Medicine

## 2015-02-08 ENCOUNTER — Encounter (HOSPITAL_COMMUNITY): Payer: BLUE CROSS/BLUE SHIELD

## 2015-02-08 ENCOUNTER — Other Ambulatory Visit: Payer: Self-pay

## 2015-02-08 DIAGNOSIS — I10 Essential (primary) hypertension: Secondary | ICD-10-CM | POA: Insufficient documentation

## 2015-02-08 DIAGNOSIS — I340 Nonrheumatic mitral (valve) insufficiency: Secondary | ICD-10-CM | POA: Insufficient documentation

## 2015-02-08 DIAGNOSIS — I429 Cardiomyopathy, unspecified: Secondary | ICD-10-CM

## 2015-02-08 DIAGNOSIS — I517 Cardiomegaly: Secondary | ICD-10-CM | POA: Diagnosis not present

## 2015-02-08 DIAGNOSIS — I5042 Chronic combined systolic (congestive) and diastolic (congestive) heart failure: Secondary | ICD-10-CM | POA: Diagnosis not present

## 2015-02-08 DIAGNOSIS — Z8249 Family history of ischemic heart disease and other diseases of the circulatory system: Secondary | ICD-10-CM | POA: Insufficient documentation

## 2015-02-08 DIAGNOSIS — E785 Hyperlipidemia, unspecified: Secondary | ICD-10-CM | POA: Diagnosis not present

## 2015-02-08 DIAGNOSIS — I428 Other cardiomyopathies: Secondary | ICD-10-CM

## 2015-02-11 ENCOUNTER — Encounter: Payer: Self-pay | Admitting: Internal Medicine

## 2015-02-11 ENCOUNTER — Ambulatory Visit (INDEPENDENT_AMBULATORY_CARE_PROVIDER_SITE_OTHER): Payer: BLUE CROSS/BLUE SHIELD | Admitting: Internal Medicine

## 2015-02-11 ENCOUNTER — Encounter (HOSPITAL_COMMUNITY): Payer: BLUE CROSS/BLUE SHIELD

## 2015-02-11 VITALS — BP 132/86 | HR 80 | Ht 66.0 in | Wt 217.3 lb

## 2015-02-11 DIAGNOSIS — R7989 Other specified abnormal findings of blood chemistry: Secondary | ICD-10-CM

## 2015-02-11 DIAGNOSIS — I429 Cardiomyopathy, unspecified: Secondary | ICD-10-CM

## 2015-02-11 DIAGNOSIS — Z79899 Other long term (current) drug therapy: Secondary | ICD-10-CM

## 2015-02-11 DIAGNOSIS — R778 Other specified abnormalities of plasma proteins: Secondary | ICD-10-CM

## 2015-02-11 DIAGNOSIS — Z9889 Other specified postprocedural states: Secondary | ICD-10-CM

## 2015-02-11 DIAGNOSIS — I10 Essential (primary) hypertension: Secondary | ICD-10-CM | POA: Diagnosis not present

## 2015-02-11 DIAGNOSIS — I428 Other cardiomyopathies: Secondary | ICD-10-CM

## 2015-02-11 MED ORDER — SPIRONOLACTONE 25 MG PO TABS: 12.5000 mg | ORAL_TABLET | Freq: Every day | ORAL | Status: DC

## 2015-02-11 NOTE — Patient Instructions (Signed)
Medication Instructions:   START Spironolactone (Aldactone) 25 mg - take 0.5 tablet by mouth daily. A new prescription has been sent to the [facility] on [address] electronically.  Labwork:  Your physician recommends that you return for lab work in 1 week.  Follow-Up:  Dr Debara Pickett recommends that you schedule a follow-up appointment in 3 months.   **Dr Debara Pickett has referred you to Dr Allegra Lai to discuss ICD therapy.

## 2015-02-12 NOTE — Progress Notes (Signed)
OFFICE NOTE  Chief Complaint:  Follow-up echo  Primary Care Physician: Cathlean Cower, MD  HPI:  Matthew Guerrero is a 59 y.o. male with a hx of HTN, HL, gout, asthma. He was seen in the emergency room in late March with abdominal pain and vomiting. ECG was abnormal and a fast scan demonstrated no pericardial effusion but global hypokinesis. Plan was to follow-up as an outpatient. However, he presented back to the emergency room with progressive shortness of breath and chest pain with exertion. Admitted 4/5-4/8 with acute systolic HF. EF was noted to be 15-20% on echo. Troponin was minimally elevated (0.14). He was diuresed. LHC was arranged and demonstrated normal coronary arteries. He was dx with NICM. Medications were adjusted for CHF and he returns for FU. Of note, ACE inhibitor was stopped and he was placed on Entresto.   Since DC, he is doing well. His breathing is improved. He is NYHA 2b. He denies orthopnea, PND, edema. He denies chest pain, syncope. Does admit to snoring and does take daytime naps. He has a non-productive cough.    Studies/Reports Reviewed Today:  Echo 09/27/14 - Mild LVH. EF 15% to 20%. Diffuse hypokinesis. There is akinesis of the entireanteroseptal myocardium. Grade 1 diastolic dysfunction. There is muscular trabeculation at apex of left ventricle (no obvious thrombus identified). Definity contrast used. - Mitral valve: There was mild regurgitation. - Left atrium: The atrium was moderately dilated. - Right ventricle: The cavity size was moderately dilated. Wall thickness was normal. - Right atrium: The atrium was mildly dilated.  LHC 09/27/14 The left main coronary artery is normal. The left anterior descending artery is normal. The left circumflex artery is normal. The right coronary artery is dominant and normal. EF: 15%.  Matthew Guerrero has an upcoming sleep study which should be helpful hopefully and may be contributing to his  erythrocytosis. I suspect the etiology of his heart failure is hypertensive heart disease. There remains to be seen whether he'll have improvement in LV function. He seems to be tolerating his medications at this point. He does get somewhat fatigued with walking up stairs and doing activities. I suspect a benefit from cardiac rehabilitation.  I saw Matthew Guerrero back in the office today. He is here for a limited echo follow-up which was performed on 02/08/2015. Unfortunately this shows the EF to to remain between 10 and 15%. You have his medication shows near optimal therapy as he is currently on aspirin, carvedilol, Lasix, and Entresto 49/51 mg. He seems to be tolerating the medications and has NYHA class II symptoms.  PMHx:  Past Medical History  Diagnosis Date  . ALLERGIC RHINITIS   . GOUT   . HYPERLIPIDEMIA   . HYPERTENSION   . Asthma   . Cervical radiculitis   . Lateral epicondylitis of right elbow   . Hyperglycemia 08/28/2014  . S/P cardiac cath wjith normal coronary arteries  09/28/2014    Past Surgical History  Procedure Laterality Date  . Colonoscopy    . Left and right heart catheterization with coronary angiogram N/A 09/27/2014    Procedure: LEFT AND RIGHT HEART CATHETERIZATION WITH CORONARY ANGIOGRAM;  Surgeon: Belva Crome, MD;  Location: [facility] C[facility] LAB;  Service: Cardiovascular;  Laterality: N/A;    FAMHx:  Family History  Problem Relation Age of Onset  . Cancer Mother   . Diabetes Father   . Heart disease Father   . Colon cancer Neg Hx   . Esophageal cancer Neg Hx   .  Rectal cancer Neg Hx   . Stomach cancer Neg Hx     SOCHx:   reports that he has never smoked. He has never used smokeless tobacco. He reports that he does not drink alcohol or use illicit drugs.  ALLERGIES:  No Known Allergies  ROS: A comprehensive review of systems was negative except for: Respiratory: positive for dyspnea on exertion  HOME MEDS: Current Outpatient Prescriptions  Medication Sig  Dispense Refill  . aspirin EC 81 MG EC tablet Take 1 tablet (81 mg total) by mouth daily.    . carvedilol (COREG) 25 MG tablet Take 1 tablet (25 mg total) by mouth 2 (two) times daily with a meal. 60 tablet 11  . colchicine 0.6 MG tablet Take 1 tablet (0.6 mg total) by mouth daily. 90 tablet 1  . furosemide (LASIX) 40 MG tablet Take 1 tablet (40 mg total) by mouth daily. 30 tablet 6  . sacubitril-valsartan (ENTRESTO) 49-51 MG Take 1 tablet by mouth 2 (two) times daily. 28 tablet 0  . traMADol (ULTRAM) 50 MG tablet Take 1 tablet (50 mg total) by mouth every 8 (eight) hours as needed. 270 tablet 1  . spironolactone (ALDACTONE) 25 MG tablet Take 0.5 tablets (12.5 mg total) by mouth daily. 15 tablet 11   No current facility-administered medications for this visit.    LABS/IMAGING: No results found for this or any previous visit (from the past 48 hour(s)). No results found.  WEIGHTS: Wt Readings from Last 3 Encounters:  02/11/15 217 lb 4.8 oz (98.567 kg)  12/13/14 205 lb (92.987 kg)  11/29/14 214 lb 15.2 oz (97.5 kg)    VITALS: BP 132/86 mmHg  Pulse 80  Ht 5\' 6"  (1.676 m)  Wt 217 lb 4.8 oz (98.567 kg)  BMI 35.09 kg/m2  EXAM: Deferred  EKG: Deferred  ASSESSMENT: 1. Nonischemic, probably hypertensive cardiomyopathy, EF 10-15% with normal coronaries-NYHA class II symptoms 2. Hypertension-controlled 3. Dyslipidemia 4. Possible structures sleep apnea 5. Gout  PLAN: 1.   Matthew Guerrero has not had improvement in his EF despite medication optimization over the past 6 months. At this point I would like to refer him for AICD placement per MADIT criteria for primary prevention of sudden cardiac death. In addition, I will add Aldactone 12.5 mg daily based on data from the RALES trial in patients with EF less than 30% regarding reduce mortality. He should have a repeat metabolic profile next week to assure he is not hyperkalemic. We will plan follow-up in about 3 months after he's been  evaluated for AICD.  Pixie Casino, MD, [facility] Attending Cardiologist [place] 02/12/2015, 8:38 PM

## 2015-02-13 ENCOUNTER — Encounter (HOSPITAL_COMMUNITY)
Admission: RE | Admit: 2015-02-13 | Discharge: 2015-02-13 | Disposition: A | Discharge status: Home / Self Care | Payer: BLUE CROSS/BLUE SHIELD | Source: Ambulatory Visit | Attending: Internal Medicine | Admitting: Internal Medicine

## 2015-02-13 DIAGNOSIS — I509 Heart failure, unspecified: Secondary | ICD-10-CM | POA: Diagnosis not present

## 2015-02-13 LAB — BASIC METABOLIC PANEL
BUN: 11 mg/dL (ref 7–25)
CALCIUM: 9.4 mg/dL (ref 8.6–10.3)
CO2: 23 mmol/L (ref 20–31)
Chloride: 103 mmol/L (ref 98–110)
Creat: 1.24 mg/dL (ref 0.70–1.25)
Glucose, Bld: 117 mg/dL — ABNORMAL HIGH (ref 65–99)
POTASSIUM: 4.4 mmol/L (ref 3.5–5.3)
Sodium: 138 mmol/L (ref 135–146)

## 2015-02-14 LAB — BRAIN NATRIURETIC PEPTIDE: BRAIN NATRIURETIC PEPTIDE: 276.6 pg/mL — AB (ref 0.0–100.0)

## 2015-02-14 NOTE — Assessment & Plan Note (Signed)
Secondary Polycythemia: Negative JAK2 V617F mutation. Erythropoeitin 18.6 (mildly elevated) 10/26/14 Recommendation: 1. Most likely related to underlying cardiac disease 2. Phlebotomy is not the standard of care for secondary polycythemia. However if his Hb crosses 20, we can consider doing it  Thrombocytopenia: Suspicious for low grade ITP (Large platelets on smear suggest increased destruction)

## 2015-02-15 ENCOUNTER — Telehealth: Payer: Self-pay | Admitting: Hematology and Oncology

## 2015-02-15 ENCOUNTER — Encounter (HOSPITAL_COMMUNITY): Payer: BLUE CROSS/BLUE SHIELD

## 2015-02-15 ENCOUNTER — Ambulatory Visit (HOSPITAL_BASED_OUTPATIENT_CLINIC_OR_DEPARTMENT_OTHER): Payer: BLUE CROSS/BLUE SHIELD | Admitting: Hematology and Oncology

## 2015-02-15 ENCOUNTER — Encounter: Payer: Self-pay | Admitting: Hematology and Oncology

## 2015-02-15 ENCOUNTER — Other Ambulatory Visit (HOSPITAL_BASED_OUTPATIENT_CLINIC_OR_DEPARTMENT_OTHER): Payer: BLUE CROSS/BLUE SHIELD

## 2015-02-15 VITALS — BP 136/83 | HR 88 | Temp 98.2°F | Resp 18 | Ht 65.0 in | Wt 219.2 lb

## 2015-02-15 DIAGNOSIS — D696 Thrombocytopenia, unspecified: Secondary | ICD-10-CM | POA: Diagnosis not present

## 2015-02-15 DIAGNOSIS — D751 Secondary polycythemia: Secondary | ICD-10-CM

## 2015-02-15 LAB — CBC WITH DIFFERENTIAL/PLATELET
BASO%: 0.5 % (ref 0.0–2.0)
Basophils Absolute: 0 10*3/uL (ref 0.0–0.1)
EOS%: 2 % (ref 0.0–7.0)
Eosinophils Absolute: 0.2 10*3/uL (ref 0.0–0.5)
HEMATOCRIT: 56 % — AB (ref 38.4–49.9)
HGB: 18 g/dL — ABNORMAL HIGH (ref 13.0–17.1)
LYMPH#: 3.2 10*3/uL (ref 0.9–3.3)
LYMPH%: 38 % (ref 14.0–49.0)
MCH: 28.4 pg (ref 27.2–33.4)
MCHC: 32.1 g/dL (ref 32.0–36.0)
MCV: 88.5 fL (ref 79.3–98.0)
MONO#: 0.6 10*3/uL (ref 0.1–0.9)
MONO%: 7.3 % (ref 0.0–14.0)
NEUT%: 52.2 % (ref 39.0–75.0)
NEUTROS ABS: 4.4 10*3/uL (ref 1.5–6.5)
PLATELETS: 141 10*3/uL (ref 140–400)
RBC: 6.33 10*6/uL — AB (ref 4.20–5.82)
RDW: 15.1 % — ABNORMAL HIGH (ref 11.0–14.6)
WBC: 8.5 10*3/uL (ref 4.0–10.3)

## 2015-02-15 NOTE — Telephone Encounter (Signed)
Appointments made and avs printed for patient °

## 2015-02-15 NOTE — Progress Notes (Signed)
Patient Care Team: Biagio Borg, MD as PCP - General  DIAGNOSIS: Secondary polycythemia and ITP Current treatment: Observation CHIEF COMPLIANT: Undergoing cardiac workup  INTERVAL HISTORY: Matthew Guerrero is a 59-year-old with above-mentioned history of secretory polycythemia and ITP. We believe the polycythemia is related to underlying cardiac disease. Matthew Guerrero is currently undergoing workup with his cardiologist. Matthew Guerrero reports that Matthew Guerrero is currently on disability. Matthew Guerrero denies any headaches lightheadedness of dizziness. No clear-cut evidence of symptoms related to polycythemia.  REVIEW OF SYSTEMS:   Constitutional: Denies fevers, chills or abnormal weight loss Eyes: Denies blurriness of vision Ears, nose, mouth, throat, and face: Denies mucositis or sore throat Respiratory: Denies cough, dyspnea or wheezes Cardiovascular: Shortness of breath exertion Gastrointestinal:  Denies nausea, heartburn or change in bowel habits Skin: Denies abnormal skin rashes Lymphatics: Denies new lymphadenopathy or easy bruising Neurological:Denies numbness, tingling or new weaknesses Behavioral/Psych: Mood is stable, no new changes  All other systems were reviewed with the patient and are negative.  I have reviewed the past medical history, past surgical history, social history and family history with the patient and they are unchanged from previous note.  ALLERGIES:  has No Known Allergies.  MEDICATIONS:  Current Outpatient Prescriptions  Medication Sig Dispense Refill  . aspirin EC 81 MG EC tablet Take 1 tablet (81 mg total) by mouth daily.    . carvedilol (COREG) 25 MG tablet Take 1 tablet (25 mg total) by mouth 2 (two) times daily with a meal. 60 tablet 11  . colchicine 0.6 MG tablet Take 1 tablet (0.6 mg total) by mouth daily. 90 tablet 1  . furosemide (LASIX) 40 MG tablet Take 1 tablet (40 mg total) by mouth daily. 30 tablet 6  . sacubitril-valsartan (ENTRESTO) 49-51 MG Take 1 tablet by mouth 2 (two) times  daily. 28 tablet 0  . spironolactone (ALDACTONE) 25 MG tablet Take 0.5 tablets (12.5 mg total) by mouth daily. 15 tablet 11  . traMADol (ULTRAM) 50 MG tablet Take 1 tablet (50 mg total) by mouth every 8 (eight) hours as needed. 270 tablet 1   No current facility-administered medications for this visit.    PHYSICAL EXAMINATION: ECOG PERFORMANCE STATUS: 1 - Symptomatic but completely ambulatory  Filed Vitals:   02/15/15 0813  BP: 136/83  Pulse: 88  Temp: 98.2 F (36.8 C)  Resp: 18   Filed Weights   02/15/15 0813  Weight: 219 lb 3.2 oz (99.428 kg)    GENERAL:alert, no distress and comfortable SKIN: skin color, texture, turgor are normal, no rashes or significant lesions EYES: normal, Conjunctiva are pink and non-injected, sclera clear OROPHARYNX:no exudate, no erythema and lips, buccal mucosa, and tongue normal  NECK: supple, thyroid normal size, non-tender, without nodularity LYMPH:  no palpable lymphadenopathy in the cervical, axillary or inguinal LUNGS: clear to auscultation and percussion with normal breathing effort HEART: regular rate & rhythm and no murmurs and no lower extremity edema ABDOMEN:abdomen soft, non-tender and normal bowel sounds Musculoskeletal:no cyanosis of digits and no clubbing  NEURO: alert & oriented x 3 with fluent speech, no focal motor/sensory deficits LABORATORY DATA:  I have reviewed the data as listed   Chemistry      Component Value Date/Time   NA 138 02/11/2015 0812   K 4.4 02/11/2015 0812   CL 103 02/11/2015 0812   CO2 23 02/11/2015 0812   BUN 11 02/11/2015 0812   CREATININE 1.24 02/11/2015 0812   CREATININE 1.15 10/05/2014 1047      Component Value Date/Time  CALCIUM 9.4 02/11/2015 0812   ALKPHOS 36* 09/26/2014 0305   AST 26 09/26/2014 0305   ALT 33 09/26/2014 0305   BILITOT 1.2 09/26/2014 0305       Lab Results  Component Value Date   WBC 8.5 02/15/2015   HGB 18.0* 02/15/2015   HCT 56.0* 02/15/2015   MCV 88.5 02/15/2015    PLT 141 02/15/2015   NEUTROABS 4.4 02/15/2015   ASSESSMENT & PLAN:  Erythrocytosis and decreased platelet count. Secondary Polycythemia: Negative JAK2 V617F mutation. Erythropoeitin 18.6 (mildly elevated) 10/26/14 Recommendation: 1. Most likely related to underlying cardiac disease 2. Phlebotomy is not the standard of care for secondary polycythemia. However if his Hb crosses 20, we can consider doing it Today's hemoglobin is 18 and platelet count is 141 Thrombocytopenia: Suspicious for low grade ITP (Large platelets on smear suggest increased destruction). Today is 141. Plan: We'll continue to watch and monitor his counts every 3 months with CBC with differential   Orders Placed This Encounter  Procedures  . CBC with Differential    Standing Status: Future     Number of Occurrences:      Standing Expiration Date: 02/15/2016   The patient has a good understanding of the overall plan. Matthew Guerrero agrees with it. Matthew Guerrero will call with any problems that may develop before the next visit here.   Rulon Eisenmenger, MD

## 2015-02-15 NOTE — Addendum Note (Signed)
Addended by: Prentiss Bells on: 02/15/2015 08:50 AM   Modules accepted: Medications

## 2015-02-18 ENCOUNTER — Encounter (HOSPITAL_COMMUNITY)
Admission: RE | Admit: 2015-02-18 | Discharge: 2015-02-18 | Disposition: A | Discharge status: Home / Self Care | Payer: BLUE CROSS/BLUE SHIELD | Source: Ambulatory Visit | Attending: Internal Medicine | Admitting: Internal Medicine

## 2015-02-18 ENCOUNTER — Telehealth: Payer: Self-pay | Admitting: Internal Medicine

## 2015-02-18 DIAGNOSIS — I509 Heart failure, unspecified: Secondary | ICD-10-CM | POA: Diagnosis not present

## 2015-02-18 NOTE — Telephone Encounter (Signed)
Pt is returning Jenna's call from Friday about some lab results and he stated that he had one other thing to tell Jenna. Please f/u with pt  Thanks

## 2015-02-18 NOTE — Telephone Encounter (Signed)
LMTCB

## 2015-02-19 NOTE — Telephone Encounter (Signed)
Patient states that Dr. Debara Pickett told him he would not be able to go back to work - his short term may turn in to long term disability r/t his need for ICD? He needs a letter that he can send to his job and his short term disability saying his is out of work and why.   Informed patient that Dr. Debara Pickett is out of the office until next week and that we would call him when the letter is ready.

## 2015-02-19 NOTE — Telephone Encounter (Signed)
Patient would like to give some information to [place].  Routed to [place]

## 2015-02-19 NOTE — Telephone Encounter (Signed)
Mr.Law is returning your call .[name] Thanks

## 2015-02-20 ENCOUNTER — Encounter (HOSPITAL_COMMUNITY)
Admission: RE | Admit: 2015-02-20 | Discharge: 2015-02-20 | Disposition: A | Discharge status: Home / Self Care | Payer: BLUE CROSS/BLUE SHIELD | Source: Ambulatory Visit | Attending: Internal Medicine | Admitting: Internal Medicine

## 2015-02-20 DIAGNOSIS — I509 Heart failure, unspecified: Secondary | ICD-10-CM | POA: Diagnosis not present

## 2015-02-22 ENCOUNTER — Encounter (HOSPITAL_COMMUNITY)
Admission: RE | Admit: 2015-02-22 | Discharge: 2015-02-22 | Disposition: A | Discharge status: Home / Self Care | Payer: BLUE CROSS/BLUE SHIELD | Source: Ambulatory Visit | Attending: Internal Medicine | Admitting: Internal Medicine

## 2015-02-22 DIAGNOSIS — I509 Heart failure, unspecified: Secondary | ICD-10-CM | POA: Diagnosis not present

## 2015-02-26 ENCOUNTER — Encounter: Payer: Self-pay | Admitting: Internal Medicine

## 2015-02-26 ENCOUNTER — Encounter: Payer: Self-pay | Admitting: *Deleted

## 2015-02-26 NOTE — Telephone Encounter (Signed)
Generated letter to keep out of work until the end of November.    Thanks.  Dr. Lemmie Evens

## 2015-02-26 NOTE — Telephone Encounter (Signed)
Spoke with patient and informed him letter was complete. Faxed letter to patient's personal fax [phone]

## 2015-02-26 NOTE — Telephone Encounter (Signed)
Returning your call. °

## 2015-02-27 ENCOUNTER — Encounter (HOSPITAL_COMMUNITY)
Admission: RE | Admit: 2015-02-27 | Discharge: 2015-02-27 | Disposition: A | Discharge status: Home / Self Care | Payer: BLUE CROSS/BLUE SHIELD | Source: Ambulatory Visit | Attending: Internal Medicine | Admitting: Internal Medicine

## 2015-02-27 DIAGNOSIS — I509 Heart failure, unspecified: Secondary | ICD-10-CM | POA: Diagnosis not present

## 2015-02-28 ENCOUNTER — Encounter: Payer: Self-pay | Admitting: Internal Medicine

## 2015-02-28 ENCOUNTER — Ambulatory Visit (INDEPENDENT_AMBULATORY_CARE_PROVIDER_SITE_OTHER): Payer: BLUE CROSS/BLUE SHIELD | Admitting: Internal Medicine

## 2015-02-28 ENCOUNTER — Other Ambulatory Visit (INDEPENDENT_AMBULATORY_CARE_PROVIDER_SITE_OTHER): Payer: BLUE CROSS/BLUE SHIELD

## 2015-02-28 VITALS — BP 120/110 | HR 90 | Ht 66.0 in | Wt 220.2 lb

## 2015-02-28 VITALS — BP 110/76 | HR 81 | Temp 98.3°F | Ht 65.0 in | Wt 220.0 lb

## 2015-02-28 DIAGNOSIS — I5022 Chronic systolic (congestive) heart failure: Secondary | ICD-10-CM | POA: Insufficient documentation

## 2015-02-28 DIAGNOSIS — E162 Hypoglycemia, unspecified: Secondary | ICD-10-CM | POA: Diagnosis not present

## 2015-02-28 DIAGNOSIS — I10 Essential (primary) hypertension: Secondary | ICD-10-CM

## 2015-02-28 DIAGNOSIS — I429 Cardiomyopathy, unspecified: Secondary | ICD-10-CM

## 2015-02-28 DIAGNOSIS — Z0000 Encounter for general adult medical examination without abnormal findings: Secondary | ICD-10-CM

## 2015-02-28 DIAGNOSIS — Z23 Encounter for immunization: Secondary | ICD-10-CM | POA: Diagnosis not present

## 2015-02-28 DIAGNOSIS — R739 Hyperglycemia, unspecified: Secondary | ICD-10-CM

## 2015-02-28 DIAGNOSIS — E785 Hyperlipidemia, unspecified: Secondary | ICD-10-CM

## 2015-02-28 DIAGNOSIS — I428 Other cardiomyopathies: Secondary | ICD-10-CM

## 2015-02-28 HISTORY — DX: Chronic systolic (congestive) heart failure: I50.22

## 2015-02-28 LAB — HEMOGLOBIN A1C: Hgb A1c MFr Bld: 6 % (ref 4.6–6.5)

## 2015-02-28 MED ORDER — CARVEDILOL 25 MG PO TABS: 25.0000 mg | ORAL_TABLET | Freq: Two times a day (BID) | ORAL | Status: DC

## 2015-02-28 MED ORDER — FUROSEMIDE 40 MG PO TABS: 40.0000 mg | ORAL_TABLET | Freq: Every day | ORAL | Status: DC

## 2015-02-28 MED ORDER — COLCHICINE 0.6 MG PO TABS: 0.6000 mg | ORAL_TABLET | Freq: Every day | ORAL | Status: DC

## 2015-02-28 MED ORDER — SPIRONOLACTONE 25 MG PO TABS: 12.5000 mg | ORAL_TABLET | Freq: Every day | ORAL | Status: DC

## 2015-02-28 NOTE — Assessment & Plan Note (Signed)
stable overall by history and exam, recent data reviewed with pt, and pt to continue medical treatment as before,  to f/u any worsening symptoms or concerns  Lab Results  Component Value Date   [place] 95 08/24/2014

## 2015-02-28 NOTE — Progress Notes (Signed)
Pre visit review using our clinic review tool, if applicable. No additional management support is needed unless otherwise documented below in the visit note. 

## 2015-02-28 NOTE — Patient Instructions (Signed)
Medication Instructions: - no changes  Labwork: - none  Procedures/Testing: - Your physician has recommended that you have a sub-cutaneous defibrillator inserted. An implantable cardioverter defibrillator (ICD) is a small device that is placed in your chest or, in rare cases, your abdomen. This device uses electrical pulses or shocks to help control life-threatening, irregular heartbeats that could lead the heart to suddenly stop beating (sudden cardiac arrest). Leads are attached to the ICD that goes into your heart. This is done in the hospital and usually requires an overnight stay. - we will send this through to the billing department for approval. We will be in touch with you once this is approved.  Follow-Up: - pending procedure  Any Additional Special Instructions Will Be Listed Below (If Applicable).

## 2015-02-28 NOTE — Assessment & Plan Note (Signed)
stable overall by history and exam, recent data reviewed with pt, and pt to continue medical treatment as before,  to f/u any worsening symptoms or concerns Lab Results  Component Value Date   HGBA1C 6.0 02/28/2015

## 2015-02-28 NOTE — Assessment & Plan Note (Signed)
stable overall by history and exam, recent data reviewed with pt, and pt to continue medical treatment as before,  to f/u any worsening symptoms or concerns BP Readings from Last 3 Encounters:  02/28/15 110/76  02/15/15 136/83  02/11/15 132/86

## 2015-02-28 NOTE — Progress Notes (Signed)
ELECTROPHYSIOLOGY CONSULT NOTE  Patient ID: Matthew Guerrero, MRN: [id number], DOB/AGE: 60-Jul-1957 60 y.o. Admit date: (Not on file) Date of Consult: 02/28/2015  Primary Physician: Cathlean Cower, MD Primary Cardiologist: [facility] Chief Complaint:  ICD   HPI Matthew Guerrero is a 59 y.o. male  Referred for consideration of an ICD. He has a history of nonischemic cardiomyopathy. He presented 4/16 with acute heart failure. Ejection fraction was 15-20%. He underwent catheterization demonstrating normal coronary arteries.  Most recent assessment 8/16 demonstrated persistent LV dysfunction.  EF was 10-15%. There is also RV dysfunction.  He has modest exercise intolerance. He denies orthopnea nocturnal dyspnea or peripheral edema.  He has had episodes of presyncope associated with diaphoresis sometimes with palpitations but not always.  He has sleep apnea and is scheduled for fitting of a mask and a couple of weeks.  He has been managed with guidelines directed therapy on beta blockers Entresto and diuretics.    Past Medical History  Diagnosis Date  . ALLERGIC RHINITIS   . GOUT   . HYPERLIPIDEMIA   . HYPERTENSION   . Asthma   . Cervical radiculitis   . Lateral epicondylitis of right elbow   . Hyperglycemia 08/28/2014  . S/P cardiac cath wjith normal coronary arteries  09/28/2014  . Nonischemic cardiomyopathy 09/28/2014  . Chronic systolic heart failure 11/22/1306      Surgical History:  Past Surgical History  Procedure Laterality Date  . Colonoscopy    . Left and right heart catheterization with coronary angiogram N/A 09/27/2014    Procedure: LEFT AND RIGHT HEART CATHETERIZATION WITH CORONARY ANGIOGRAM;  Surgeon: Belva Crome, MD;  Location[facility] CATH LAB;  Service: Cardiovascular;  Laterality: N/A;     Home Meds: Prior to Admission medications   Medication Sig Start Date End Date Taking? Authorizing Provider  aspirin EC 81 MG EC tablet Take 1 tablet (81 mg total) by mouth daily. 09/28/14   Yes Isaiah Serge, NP  carvedilol (COREG) 25 MG tablet Take 1 tablet (25 mg total) by mouth 2 (two) times daily with a meal. 02/28/15  Yes Biagio Borg, MD  colchicine 0.6 MG tablet Take 1 tablet (0.6 mg total) by mouth daily. 02/28/15  Yes Biagio Borg, MD  furosemide (LASIX) 40 MG tablet Take 1 tablet (40 mg total) by mouth daily. 02/28/15  Yes Biagio Borg, MD  sacubitril-valsartan (ENTRESTO) 49-51 MG Take 1 tablet by mouth 2 (two) times daily. 09/30/14  Yes Pixie Casino, MD  spironolactone (ALDACTONE) 25 MG tablet Take 0.5 tablets (12.5 mg total) by mouth daily. 02/28/15  Yes Biagio Borg, MD  traMADol (ULTRAM) 50 MG tablet Take 1 tablet (50 mg total) by mouth every 8 (eight) hours as needed. 06/22/14  Yes Leonard Schwartz, MD      Allergies: No Known Allergies  Social History   Social History  . Marital Status: Single    Spouse Name: N/A  . Number of Children: N/A  . Years of Education: N/A   Occupational History  . Not on file.   Social History Main Topics  . Smoking status: Never Smoker   . Smokeless tobacco: Never Used  . Alcohol Use: No  . Drug Use: No  . Sexual Activity: Not on file   Other Topics Concern  . Not on file   Social History Narrative     Family History  Problem Relation Age of Onset  . Lung cancer Mother   . Diabetes Father   .  Heart disease Father   . Colon cancer Neg Hx   . Esophageal cancer Neg Hx   . Rectal cancer Neg Hx   . Stomach cancer Neg Hx   . Heart attack Father 32  . Hypertension Sister   . Aneurysm Sister 79    brain aneurysm  . Stroke Sister 34     ROS:  Please see the history of present illness.     All other systems reviewed and negative.    Physical Exam: Blood pressure 120/110, pulse 90, height 5\' 6"  (1.676 m), weight 220 lb 3.2 oz (99.882 kg). General: Well developed, well nourished male in no acute distress. Head: Normocephalic, atraumatic, sclera non-icteric, no xanthomas, nares are without discharge. EENT: normal Lymph  Nodes:  none Back: without scoliosis/kyphosis, no CVA tendersness Neck: Negative for carotid bruits. JVD not elevated. Lungs: Clear bilaterally to auscultation without wheezes, rales, or rhonchi. Breathing is unlabored. Heart: RRR with S1 S2. No  murmur , rubs, or gallops appreciated. Abdomen: Soft, non-tender, non-distended with normoactive bowel sounds. No hepatomegaly. No rebound/guarding. No obvious abdominal masses. Msk:  Strength and tone appear normal for age. Extremities: No clubbing or cyanosis.  tr edema.  Distal pedal pulses are 2+ and equal bilaterally. Skin: Warm and Dry Neuro: Alert and oriented X 3. CN III-XII intact Grossly normal sensory and motor function . Psych:  Responds to questions appropriately with a normal affect.      Labs: Cardiac Enzymes No results for input(s): CKTOTAL, CKMB, TROPONINI in the last 72 hours. CBC Lab Results  Component Value Date   WBC 8.5 02/15/2015   HGB 18.0* 02/15/2015   HCT 56.0* 02/15/2015   MCV 88.5 02/15/2015   PLT 141 02/15/2015   PROTIME: No results for input(s): LABPROT, INR in the last 72 hours. Chemistry No results for input(s): NA, K, CL, CO2, BUN, CREATININE, CALCIUM, PROT, BILITOT, ALKPHOS, ALT, AST, GLUCOSE in the last 168 hours.  Invalid input(s): LABALBU Lipids Lab Results  Component Value Date   CHOL 158 08/24/2014   HDL 35.10* 08/24/2014   LDLCALC 95 08/24/2014   TRIG 142.0 08/24/2014   BNP No results found for: PROBNP Thyroid Function Tests: No results for input(s): TSH, T4TOTAL, T3FREE, THYROIDAB in the last 72 hours.  Invalid input(s): FREET3    Miscellaneous No results found for: DDIMER  Radiology/Studies:  No results found.  EKG: sinus @ 90  16/11/38    Assessment and Plan:  Nonischemic cardiomyopathy  Congestive heart failure-colonic-class II systolic  Hypertension   The patient has severe LV dysfunction  most recently measured  8/16. He has class II heart failure. He is  appropriately considered for ICD implantation for primary prevention.   We have discussed the relative benefits and merits of transvenous versus subcutaneous ICD implantation.  The advantages of the former including battery longevity, history derived from use in randomized controlled trials and perhaps a somewhat lower rate of inappropriate ICD discharges. Advantages of the latter  include the fact that it is extravascular,  Resulting different implications of device infection and it being non-transvalvular.    He would prefer consideration forSICD.  Have reviewed the potential benefits and risks of ICD implantation including but not limited to death, perforation of heart or lung, lead dislodgement, infection,  device malfunction and inappropriate shocks.  The patient  express understanding  and are willing to proceed.  \   He is to get sleep study   [name]

## 2015-02-28 NOTE — Progress Notes (Signed)
Subjective:    Patient ID: Matthew Guerrero, male    DOB: December 22, 1955, 59 y.o.   MRN: [id number]  HPI  Here to f/u; overall doing ok,  Pt denies chest pain, increasing sob or doe, wheezing, orthopnea, PND, increased LE swelling, palpitations, dizziness or syncope.  Pt denies new neurological symptoms such as new headache, or facial or extremity weakness or numbness.  Pt denies polydipsia, polyuria, or low sugar episode.   Pt denies new neurological symptoms such as new headache, or facial or extremity weakness or numbness.   Pt states overall good compliance with meds, mostly trying to follow appropriate diet, with wt overall stable,  but little exercise however. Wt Readings from Last 3 Encounters:  02/28/15 220 lb (99.791 kg)  02/15/15 219 lb 3.2 oz (99.428 kg)  02/11/15 217 lb 4.8 oz (98.567 kg)   Past Medical History  Diagnosis Date  . ALLERGIC RHINITIS   . GOUT   . HYPERLIPIDEMIA   . HYPERTENSION   . Asthma   . Cervical radiculitis   . Lateral epicondylitis of right elbow   . Hyperglycemia 08/28/2014  . S/P cardiac cath wjith normal coronary arteries  09/28/2014   Past Surgical History  Procedure Laterality Date  . Colonoscopy    . Left and right heart catheterization with coronary angiogram N/A 09/27/2014    Procedure: LEFT AND RIGHT HEART CATHETERIZATION WITH CORONARY ANGIOGRAM;  Surgeon: Belva Crome, MD;  Location: [facility] CATH LAB;  Service: Cardiovascular;  Laterality: N/A;    reports that he has never smoked. He has never used smokeless tobacco. He reports that he does not drink alcohol or use illicit drugs. family history includes Aneurysm (age of onset: 25) in his sister; Diabetes in his father; Heart attack (age of onset: 89) in his father; Heart disease in his father; Hypertension in his sister; Lung cancer in his mother; Stroke (age of onset: 66) in his sister. There is no history of Colon cancer, Esophageal cancer, Rectal cancer, or Stomach cancer. No Known Allergies Current  Outpatient Prescriptions on File Prior to Visit  Medication Sig Dispense Refill  . aspirin EC 81 MG EC tablet Take 1 tablet (81 mg total) by mouth daily.    . sacubitril-valsartan (ENTRESTO) 49-51 MG Take 1 tablet by mouth 2 (two) times daily. 28 tablet 0  . traMADol (ULTRAM) 50 MG tablet Take 1 tablet (50 mg total) by mouth every 8 (eight) hours as needed. 270 tablet 1   No current facility-administered medications on file prior to visit.   Review of Systems  Constitutional: Negative for unusual diaphoresis or night sweats HENT: Negative for ringing in ear or discharge Eyes: Negative for double vision or worsening visual disturbance.  Respiratory: Negative for choking and stridor.   Gastrointestinal: Negative for vomiting or other signifcant bowel change Genitourinary: Negative for hematuria or change in urine volume.  Musculoskeletal: Negative for other MSK pain or swelling Skin: Negative for color change and worsening wound.  Neurological: Negative for tremors and numbness other than noted  Psychiatric/Behavioral: Negative for decreased concentration or agitation other than above       Objective:   Physical Exam BP 110/76 mmHg  Pulse 81  Temp(Src) 98.3 F (36.8 C) (Oral)  Ht 5\' 5"  (1.651 m)  Wt 220 lb (99.791 kg)  BMI 36.61 kg/m2  SpO2 96% VS noted,  Constitutional: Pt appears in no significant distress HENT: Head: NCAT.  Right Ear: External ear normal.  Left Ear: External ear normal.  Eyes: .  Pupils are equal, round, and reactive to light. Conjunctivae and EOM are normal Neck: Normal range of motion. Neck supple.  Cardiovascular: Normal rate and regular rhythm.   Pulmonary/Chest: Effort normal and breath sounds without rales or wheezing.  Abd:  Soft, NT, ND, + BS Neurological: Pt is alert. Not confused , motor grossly intact Skin: Skin is warm. No rash, no LE edema Psychiatric: Pt behavior is normal. No agitation.     Assessment & Plan:

## 2015-02-28 NOTE — Patient Instructions (Signed)
You the Pneumovax pneumonia shot today  Please continue all other medications as before, and refills have been done if requested - the 3 for 90 days as your requested  Please have the pharmacy call with any other refills you may need.  Please continue your efforts at being more active, low cholesterol diet, and weight control.  Please keep your appointments with your specialists as you may have planned  Please go to the LAB in the Basement (turn left off the elevator) for the tests to be done today - just the A1c test today (for sugar)  You will be contacted by phone if any changes need to be made immediately.  Otherwise, you will receive a letter about your results with an explanation, but please check with MyChart first.  Please remember to sign up for MyChart if you have not done so, as this will be important to you in the future with finding out test results, communicating by private email, and scheduling acute appointments online when needed.  Please return in 6 months, or sooner if needed, with Lab testing done 3-5 days before

## 2015-02-28 NOTE — Addendum Note (Signed)
Addended by: Biagio Borg on: 02/28/2015 02:54 PM   Modules accepted: Orders

## 2015-03-01 ENCOUNTER — Encounter (HOSPITAL_COMMUNITY)
Admission: RE | Admit: 2015-03-01 | Discharge: 2015-03-01 | Disposition: A | Discharge status: Home / Self Care | Payer: BLUE CROSS/BLUE SHIELD | Source: Ambulatory Visit | Attending: Internal Medicine | Admitting: Internal Medicine

## 2015-03-01 ENCOUNTER — Telehealth: Payer: Self-pay | Admitting: Internal Medicine

## 2015-03-01 DIAGNOSIS — I509 Heart failure, unspecified: Secondary | ICD-10-CM | POA: Diagnosis not present

## 2015-03-01 DIAGNOSIS — I428 Other cardiomyopathies: Secondary | ICD-10-CM

## 2015-03-01 NOTE — Telephone Encounter (Signed)
Spoke with patient who called to ask if procedure has been approved.  He states he needs to have the procedure before September 30 because of job and insurance change.  I advised him that Trinidad Curet, RN, Dr. Olin Pia nurse will return Monday and that I will route message to her for follow-up when she returns.  He verbalized understanding and agreement.

## 2015-03-01 NOTE — Telephone Encounter (Signed)
New message     Pt has questions regarding upcoming procedure

## 2015-03-04 ENCOUNTER — Encounter (HOSPITAL_COMMUNITY)
Admission: RE | Admit: 2015-03-04 | Discharge: 2015-03-04 | Disposition: A | Discharge status: Home / Self Care | Payer: BLUE CROSS/BLUE SHIELD | Source: Ambulatory Visit | Attending: Internal Medicine | Admitting: Internal Medicine

## 2015-03-04 DIAGNOSIS — I509 Heart failure, unspecified: Secondary | ICD-10-CM | POA: Diagnosis not present

## 2015-03-04 NOTE — Telephone Encounter (Signed)
Follow up      Pt calling again because he has not received a call back from nurse regarding procedure

## 2015-03-04 NOTE — Telephone Encounter (Signed)
F/u ° ° °Pt returning call from nurse. °

## 2015-03-04 NOTE — Telephone Encounter (Signed)
I spoke with the patient and advised him that his letter from [place] was submitted to precert today. I should hopefully know no later than next Monday if his S-ICD is approved. I have put him on the schedule for implant on 03/21/15 to insure anesthesia is available. The patient is aware I will call him back once approval is determined.

## 2015-03-06 ENCOUNTER — Encounter (HOSPITAL_COMMUNITY)
Admission: RE | Admit: 2015-03-06 | Discharge: 2015-03-06 | Disposition: A | Discharge status: Home / Self Care | Payer: BLUE CROSS/BLUE SHIELD | Source: Ambulatory Visit | Attending: Internal Medicine | Admitting: Internal Medicine

## 2015-03-06 DIAGNOSIS — I509 Heart failure, unspecified: Secondary | ICD-10-CM | POA: Diagnosis not present

## 2015-03-07 ENCOUNTER — Ambulatory Visit (HOSPITAL_BASED_OUTPATIENT_CLINIC_OR_DEPARTMENT_OTHER): Payer: BLUE CROSS/BLUE SHIELD | Attending: Cardiology | Admitting: Radiology

## 2015-03-07 VITALS — Ht 67.0 in | Wt 213.0 lb

## 2015-03-07 DIAGNOSIS — G4733 Obstructive sleep apnea (adult) (pediatric): Secondary | ICD-10-CM | POA: Diagnosis not present

## 2015-03-07 DIAGNOSIS — I493 Ventricular premature depolarization: Secondary | ICD-10-CM | POA: Diagnosis not present

## 2015-03-07 DIAGNOSIS — G4730 Sleep apnea, unspecified: Secondary | ICD-10-CM | POA: Diagnosis present

## 2015-03-08 ENCOUNTER — Encounter (HOSPITAL_COMMUNITY)
Admission: RE | Admit: 2015-03-08 | Discharge: 2015-03-08 | Disposition: A | Discharge status: Home / Self Care | Payer: BLUE CROSS/BLUE SHIELD | Source: Ambulatory Visit | Attending: Internal Medicine | Admitting: Internal Medicine

## 2015-03-08 DIAGNOSIS — I509 Heart failure, unspecified: Secondary | ICD-10-CM | POA: Diagnosis not present

## 2015-03-08 NOTE — Progress Notes (Signed)
Pt graduated from cardiac rehab program today with completion of 36 exercise sessions in Phase II. Pt maintained good attendance to exercise and education classes. Pt  progressed nicely during his participation in rehab as evidenced by increased MET level.  Pt MET level increased from  3.6 to 5.5 during a 13 week period of time.  Medication list reconciled. Repeat  PHQ score-0 .  Pt has a strong faith base and supportive church family. Significant increase in quality of life survey score results.  Pt had increases in 4 areas as followed.  Overall increased from 27.09 to 29.31, Health and Function 26.70 to 28.80, Socioeconomic 25.69 to 30.00, Physical/Spiritual remained 30.0 and family increased from 26.40 to 28.0 Pt will have ICD placed later this month and feels confident that the procedure will go as planned and has no issues or concerns.  Pt will start cpap machine for home use.  On last evening pt had his second sleep study and was able to tolerate the mask with no problems.  Pt has made significant lifestyle changes and should be commended for his success. Pt feels he has made progress toward meeting  his goals during cardiac rehab. Pt short term goal was to build heart muscle.  Although his ejection fraction did not increase as he desired he feels he has made some progress with the activities he can do.  Pt long term goal is to get back to where he used to be.  Pt feels he is making progress toward meeting this goal. Pt is pursuing disability due to his inability to work due to his cardiac function.    Pt plans to continue exercise with walking  6-7 times a week. Cherre Huger, BSN

## 2015-03-11 ENCOUNTER — Telehealth: Payer: Self-pay | Admitting: Internal Medicine

## 2015-03-11 NOTE — Telephone Encounter (Signed)
New message      Pt had a sleep study but have not heard regarding the CPAP machine. Please call

## 2015-03-11 NOTE — Telephone Encounter (Signed)
Spoke with patient and notified him results of sleep study are not yet available and he will receive a call when the MD has read the test. He states his insurance is changing at the end of this month so he was hoping to have all this addressed and a referral to [facility] for CPAP (if necessary) done by them.   Patient aware Dr. Olin Pia nurse will call him regarding questions r/t his procedure.

## 2015-03-11 NOTE — Telephone Encounter (Signed)
Follow up      Talk to Dr Olin Pia nurse regarding upcoming procedure

## 2015-03-11 NOTE — Telephone Encounter (Signed)
I spoke with the patient and made him aware that his initial pre-cert for S-ICD was denied, but Dr. Caryl Comes has a request in to [facility] to be contacted for appeal. I will be back in touch with him later this week. He is agreeable.

## 2015-03-13 ENCOUNTER — Encounter: Payer: Self-pay | Admitting: *Deleted

## 2015-03-13 ENCOUNTER — Telehealth: Payer: Self-pay | Admitting: Cardiology

## 2015-03-13 NOTE — Progress Notes (Signed)
   Patient Name: Matthew Guerrero, Matthew Guerrero MRN: [id number] Study Date: 03/07/2015 Gender: Male D.O.B: [birth year] Age (years): 56 Referring Provider: Fransico Him MD, ABSM Interpreting Physician: Fransico Him MD, ABSM RPSGT: Zadie Rhine  Height (inches): 67 Weight (lbs): 213 BMI: 33 Neck Size: 18.00  CLINICAL INFORMATION The patient is referred for a CPAP titration to treat sleep apnea.  Date of NPSG, Split Night or HST:12/16/2014  SLEEP STUDY TECHNIQUE As per the AASM Manual for the Scoring of Sleep and Associated Events v2.3 (April 2016) with a hypopnea requiring 4% desaturations.  The channels recorded and monitored were frontal, central and occipital EEG, electrooculogram (EOG), submentalis EMG (chin), nasal and oral airflow, thoracic and abdominal wall motion, anterior tibialis EMG, snore microphone, electrocardiogram, and pulse oximetry. Continuous positive airway pressure (CPAP) was initiated at the beginning of the study and titrated to treat sleep-disordered breathing.  MEDICATIONS Medications taken by the patient : ASA, Colchicine, Coreg, Lasix, Entresto, Aldactone, Ultram. Medications administered by patient during sleep study : No sleep medicine administered.  TECHNICIAN COMMENTS Comments added by technician: NO MEDICATION WAS TAKEN  Comments added by scorer: N/A  RESPIRATORY PARAMETERS Optimal PAP Pressure (cm): 9  AHI at Optimal Pressure (/hr):0.0 Overall Minimal O2 (%):84.00   Supine % at Optimal Pressure (%):100 Minimal O2 at Optimal Pressure (%):92.0    SLEEP ARCHITECTURE The study was initiated at 9:49:05 PM and ended at 5:08:46 AM.  Sleep onset time was 11.2 minutes and the sleep efficiency was reduced at 79.5%. The total sleep time was 349.5 minutes.  The patient spent 5.72% of the night in stage N1 sleep, 65.09% in stage N2 sleep, 0.00% in stage N3 and 29.18% in REM.Stage REM latency was 79.0 minutes  Wake after sleep onset was 78.9. Alpha intrusion was  absent. Supine sleep was 74.59%.  CARDIAC DATA The 2 lead EKG demonstrated sinus rhythm. The mean heart rate was 68.61 beats per minute. Other EKG findings include: PVCs.  LEG MOVEMENT DATA The total Periodic Limb Movements of Sleep (PLMS) were 0. The PLMS index was 0.00. A PLMS index of <15 is considered normal in adults.  IMPRESSIONS The optimal PAP pressure was 9 cm of water. Central sleep apnea was not noted during this titration (CAI = 0.9/h). Moderate oxygen desaturations were observed during this titration (min O2 = 84.00%). No snoring was audible during this study. 2-lead EKG demonstrated: PVCs Clinically significant periodic limb movements were not noted during this study. Arousals associated with PLMs were rare.  DIAGNOSIS Obstructive Sleep Apnea (327.23 [G47.33 ICD-10])  RECOMMENDATIONS Trial of CPAP therapy on 9 cm H2O with a Standard size Fisher&Paykel Nasal Pillow Mask [place] Q mask and heated humidification. Avoid alcohol, sedatives and other CNS depressants that may worsen sleep apnea and disrupt normal sleep architecture. Sleep hygiene should be reviewed to assess factors that may improve sleep quality. Weight management and regular exercise should be initiated or continued. Return to [facility] for re-evaluation after 10 weeks of therapy and download in 4 weeks to document compliance.    [place], American Board of Sleep Medicine  ELECTRONICALLY SIGNED ON:  03/13/2015, 7:10 PM [place] PH: [phone]   FX: [phone] [place]

## 2015-03-13 NOTE — Telephone Encounter (Signed)
Pt had successful PAP titration. Please setup appointment in 10 weeks. Please let [facility] know that order for PAP is in EPIC.   

## 2015-03-13 NOTE — Addendum Note (Signed)
Addended by: Sueanne Margarita on: 03/13/2015 07:15 PM   Modules accepted: Orders

## 2015-03-13 NOTE — Telephone Encounter (Signed)
Per Dr. Caryl Comes- [employer] has approved S-ICD implant. The patient is aware of this and is confirmed for implant date on 03/21/15 at 7:30 am. He will come to the office on Friday 03/15/15 for labs and to pick up his instruction letter.  He is agreeable.

## 2015-03-14 ENCOUNTER — Other Ambulatory Visit: Payer: Self-pay | Admitting: Internal Medicine

## 2015-03-14 DIAGNOSIS — I255 Ischemic cardiomyopathy: Secondary | ICD-10-CM

## 2015-03-14 NOTE — Telephone Encounter (Signed)
LM on private voicemail that [facility] will be contacting him to set up CPAP machine. Once he receives his machine, I will schedule 10 week follow-up

## 2015-03-15 ENCOUNTER — Other Ambulatory Visit (INDEPENDENT_AMBULATORY_CARE_PROVIDER_SITE_OTHER): Payer: BLUE CROSS/BLUE SHIELD | Admitting: *Deleted

## 2015-03-15 ENCOUNTER — Telehealth: Payer: Self-pay | Admitting: Internal Medicine

## 2015-03-15 ENCOUNTER — Other Ambulatory Visit: Payer: BLUE CROSS/BLUE SHIELD

## 2015-03-15 DIAGNOSIS — D72818 Other decreased white blood cell count: Secondary | ICD-10-CM

## 2015-03-15 DIAGNOSIS — I429 Cardiomyopathy, unspecified: Secondary | ICD-10-CM

## 2015-03-15 DIAGNOSIS — D729 Disorder of white blood cells, unspecified: Secondary | ICD-10-CM

## 2015-03-15 DIAGNOSIS — I428 Other cardiomyopathies: Secondary | ICD-10-CM

## 2015-03-15 LAB — BASIC METABOLIC PANEL
BUN: 12 mg/dL (ref 6–23)
CALCIUM: 9.6 mg/dL (ref 8.4–10.5)
CHLORIDE: 100 meq/L (ref 96–112)
CO2: 27 mEq/L (ref 19–32)
CREATININE: 1.22 mg/dL (ref 0.40–1.50)
GFR: 77.78 mL/min (ref >60.00)
Glucose, Bld: 126 mg/dL — ABNORMAL HIGH (ref 70–99)
Potassium: 4.4 mEq/L (ref 3.5–5.1)
Sodium: 138 mEq/L (ref 135–145)

## 2015-03-15 LAB — PROTIME-INR
INR: 1.2 ratio — AB (ref 0.8–1.0)
PROTHROMBIN TIME: 13.8 s — AB (ref 9.6–13.1)

## 2015-03-15 NOTE — Telephone Encounter (Addendum)
Spoke w/ patient, he states his insurance is expiring at end of month and he will hit a gap - new insurance he is getting has deductible requirement before this equipment would be paid for. Since he is likely looking at paying out of pocket for equipment, he is trying to find out if there is a way to get used CPAP device. Informed him I am not aware of this - there may be a service- would need to ask about this. He has CPAP titration orders that were sent to [facility].  He requested call from [place] if she is able to help. He is aware she is out of office today. Will route.

## 2015-03-15 NOTE — Telephone Encounter (Signed)
Please call,wants to know where he can get an used C-Pap,he can not afford a new one.

## 2015-03-19 ENCOUNTER — Other Ambulatory Visit: Payer: Self-pay

## 2015-03-19 ENCOUNTER — Telehealth: Payer: Self-pay | Admitting: *Deleted

## 2015-03-19 ENCOUNTER — Other Ambulatory Visit: Payer: Self-pay | Admitting: *Deleted

## 2015-03-19 ENCOUNTER — Other Ambulatory Visit: Payer: BLUE CROSS/BLUE SHIELD

## 2015-03-19 ENCOUNTER — Other Ambulatory Visit (INDEPENDENT_AMBULATORY_CARE_PROVIDER_SITE_OTHER): Payer: BLUE CROSS/BLUE SHIELD | Admitting: *Deleted

## 2015-03-19 DIAGNOSIS — I10 Essential (primary) hypertension: Secondary | ICD-10-CM

## 2015-03-19 DIAGNOSIS — D72818 Other decreased white blood cell count: Secondary | ICD-10-CM

## 2015-03-19 DIAGNOSIS — D751 Secondary polycythemia: Secondary | ICD-10-CM

## 2015-03-19 DIAGNOSIS — E785 Hyperlipidemia, unspecified: Secondary | ICD-10-CM

## 2015-03-19 DIAGNOSIS — I428 Other cardiomyopathies: Secondary | ICD-10-CM

## 2015-03-19 DIAGNOSIS — I5021 Acute systolic (congestive) heart failure: Secondary | ICD-10-CM

## 2015-03-19 DIAGNOSIS — I5022 Chronic systolic (congestive) heart failure: Secondary | ICD-10-CM

## 2015-03-19 DIAGNOSIS — D729 Disorder of white blood cells, unspecified: Secondary | ICD-10-CM

## 2015-03-19 NOTE — Telephone Encounter (Signed)
Spoke with patient. He can get his CPAP thru [facility] with his new insurance. No further assistance needed.

## 2015-03-19 NOTE — Addendum Note (Signed)
Addended by: Domenica Reamer R on: 03/19/2015 12:32 PM   Modules accepted: Orders

## 2015-03-19 NOTE — Telephone Encounter (Signed)
Spoke with Matthew Guerrero from [facility] labs. She stated lab draw from this AM did not work with his blood disorder and labs will need to be redrawn into a lavendar top and a blue top. Notified patient and he will be back in this afternoon to have re-draw done. Notified Bethany in lab that pt will be coming back in and to please draw [employer] and [employer] and put Attention: Matthew Guerrero on tubes to alert Matthew Guerrero as to the patient "re-draw". Corene Cornea, supervisor, will have charge taken off from this morning since it was not patient error. Lab did not get appropriate message for which tubes to draw.

## 2015-03-19 NOTE — Addendum Note (Signed)
Addended by: Domenica Reamer R on: 03/19/2015 04:31 PM   Modules accepted: Orders

## 2015-03-20 ENCOUNTER — Encounter: Payer: Self-pay | Admitting: Internal Medicine

## 2015-03-20 LAB — CBC WITH DIFFERENTIAL/PLATELET
Basophils Absolute: 0.1 10*3/uL (ref 0.0–0.1)
Basophils Relative: 2 % (ref 0.0–3.0)
EOS PCT: 1.7 % (ref 0.0–5.0)
Eosinophils Absolute: 0.1 10*3/uL (ref 0.0–0.7)
HEMATOCRIT: 56 % — AB (ref 39.0–52.0)
HEMOGLOBIN: 18.3 g/dL — AB (ref 13.0–17.0)
LYMPHS ABS: 2.8 10*3/uL (ref 0.7–4.0)
Lymphocytes Relative: 42.8 % (ref 12.0–46.0)
MCHC: 32.6 g/dL (ref 30.0–36.0)
MCV: 87.5 fl (ref 78.0–100.0)
MONOS PCT: 5.7 % (ref 3.0–12.0)
Monocytes Absolute: 0.4 10*3/uL (ref 0.1–1.0)
NEUTROS PCT: 47.8 % (ref 43.0–77.0)
Neutro Abs: 3.2 10*3/uL (ref 1.4–7.7)
Platelets: 127 10*3/uL — ABNORMAL LOW (ref 150.0–400.0)
RBC: 6.4 Mil/uL — AB (ref 4.22–5.81)
RDW: 14.9 % (ref 11.5–15.5)
WBC: 6.6 10*3/uL (ref 4.0–10.5)

## 2015-03-21 ENCOUNTER — Ambulatory Visit (HOSPITAL_COMMUNITY)
Admission: RE | Admit: 2015-03-21 | Discharge: 2015-03-22 | Disposition: A | Discharge status: Home / Self Care | Payer: BLUE CROSS/BLUE SHIELD | Source: Ambulatory Visit | Attending: Internal Medicine | Admitting: Internal Medicine

## 2015-03-21 ENCOUNTER — Ambulatory Visit (HOSPITAL_COMMUNITY): Payer: BLUE CROSS/BLUE SHIELD | Admitting: Critical Care Medicine

## 2015-03-21 ENCOUNTER — Encounter (HOSPITAL_COMMUNITY): Payer: Self-pay | Admitting: Critical Care Medicine

## 2015-03-21 ENCOUNTER — Ambulatory Visit (HOSPITAL_COMMUNITY): Payer: BLUE CROSS/BLUE SHIELD

## 2015-03-21 ENCOUNTER — Encounter (HOSPITAL_COMMUNITY)
Admission: RE | Disposition: A | Discharge status: Home / Self Care | Payer: BLUE CROSS/BLUE SHIELD | Source: Ambulatory Visit | Attending: Internal Medicine

## 2015-03-21 DIAGNOSIS — I10 Essential (primary) hypertension: Secondary | ICD-10-CM | POA: Diagnosis not present

## 2015-03-21 DIAGNOSIS — I429 Cardiomyopathy, unspecified: Secondary | ICD-10-CM | POA: Diagnosis present

## 2015-03-21 DIAGNOSIS — E785 Hyperlipidemia, unspecified: Secondary | ICD-10-CM | POA: Insufficient documentation

## 2015-03-21 DIAGNOSIS — I428 Other cardiomyopathies: Secondary | ICD-10-CM

## 2015-03-21 DIAGNOSIS — I5022 Chronic systolic (congestive) heart failure: Secondary | ICD-10-CM | POA: Diagnosis not present

## 2015-03-21 DIAGNOSIS — G4733 Obstructive sleep apnea (adult) (pediatric): Secondary | ICD-10-CM | POA: Insufficient documentation

## 2015-03-21 DIAGNOSIS — I255 Ischemic cardiomyopathy: Secondary | ICD-10-CM

## 2015-03-21 DIAGNOSIS — Z7982 Long term (current) use of aspirin: Secondary | ICD-10-CM | POA: Diagnosis not present

## 2015-03-21 DIAGNOSIS — Z959 Presence of cardiac and vascular implant and graft, unspecified: Secondary | ICD-10-CM

## 2015-03-21 HISTORY — PX: OTHER SURGICAL HISTORY: SHX169

## 2015-03-21 HISTORY — DX: Gastro-esophageal reflux disease without esophagitis: K21.9

## 2015-03-21 HISTORY — DX: Heart failure, unspecified: I50.9

## 2015-03-21 HISTORY — DX: Sleep apnea, unspecified: G47.30

## 2015-03-21 HISTORY — PX: EP IMPLANTABLE DEVICE: SHX172B

## 2015-03-21 LAB — SURGICAL PCR SCREEN
MRSA, PCR: NEGATIVE
Staphylococcus aureus: NEGATIVE

## 2015-03-21 SURGERY — SUBQ ICD IMPLANT
Anesthesia: General

## 2015-03-21 MED ORDER — EPHEDRINE SULFATE 50 MG/ML IJ SOLN
INTRAMUSCULAR | Status: DC | PRN
  Administered 2015-03-21: 10 mg via INTRAVENOUS
  Administered 2015-03-21: 5 mg via INTRAVENOUS
  Administered 2015-03-21: 10 mg via INTRAVENOUS
  Administered 2015-03-21: 5 mg via INTRAVENOUS

## 2015-03-21 MED ORDER — ONDANSETRON HCL 4 MG/2ML IJ SOLN
INTRAMUSCULAR | Status: DC | PRN
  Administered 2015-03-21 (×2): 4 mg via INTRAVENOUS

## 2015-03-21 MED ORDER — CEFAZOLIN SODIUM-DEXTROSE 2-3 GM-% IV SOLR
INTRAVENOUS | Status: AC
  Filled 2015-03-21: qty 50

## 2015-03-21 MED ORDER — BUPIVACAINE HCL (PF) 0.25 % IJ SOLN
INTRAMUSCULAR | Status: AC
  Filled 2015-03-21: qty 30

## 2015-03-21 MED ORDER — FENTANYL CITRATE (PF) 100 MCG/2ML IJ SOLN
INTRAMUSCULAR | Status: DC | PRN
  Administered 2015-03-21 (×4): 25 ug via INTRAVENOUS

## 2015-03-21 MED ORDER — MIDAZOLAM HCL 5 MG/5ML IJ SOLN
INTRAMUSCULAR | Status: DC | PRN
  Administered 2015-03-21 (×2): 1 mg via INTRAVENOUS

## 2015-03-21 MED ORDER — PHENYLEPHRINE HCL 10 MG/ML IJ SOLN
10.0000 mg | INTRAVENOUS | Status: DC | PRN
  Administered 2015-03-21: 50 ug/min via INTRAVENOUS

## 2015-03-21 MED ORDER — COLCHICINE 0.6 MG PO TABS
0.6000 mg | ORAL_TABLET | Freq: Every day | ORAL | Status: DC
  Administered 2015-03-22: 0.6 mg via ORAL
  Filled 2015-03-21: qty 1

## 2015-03-21 MED ORDER — MUPIROCIN 2 % EX OINT
1.0000 "application " | TOPICAL_OINTMENT | Freq: Once | CUTANEOUS | Status: AC
  Administered 2015-03-21: 1 via TOPICAL
  Filled 2015-03-21: qty 22

## 2015-03-21 MED ORDER — ADULT MULTIVITAMIN W/MINERALS CH
1.0000 | ORAL_TABLET | Freq: Every day | ORAL | Status: DC
  Administered 2015-03-22: 1 via ORAL
  Filled 2015-03-21: qty 1

## 2015-03-21 MED ORDER — ACETAMINOPHEN 325 MG PO TABS
325.0000 mg | ORAL_TABLET | ORAL | Status: DC | PRN
  Administered 2015-03-21: 650 mg via ORAL
  Filled 2015-03-21: qty 2

## 2015-03-21 MED ORDER — TRAMADOL HCL 50 MG PO TABS
50.0000 mg | ORAL_TABLET | Freq: Four times a day (QID) | ORAL | Status: DC
  Administered 2015-03-21 (×2): 50 mg via ORAL
  Filled 2015-03-21 (×2): qty 1

## 2015-03-21 MED ORDER — FENTANYL CITRATE (PF) 100 MCG/2ML IJ SOLN: 25.0000 ug | INTRAMUSCULAR | Status: DC | PRN

## 2015-03-21 MED ORDER — GLYCOPYRROLATE 0.2 MG/ML IJ SOLN
INTRAMUSCULAR | Status: DC | PRN
  Administered 2015-03-21 (×2): 0.1 mg via INTRAVENOUS

## 2015-03-21 MED ORDER — FAMOTIDINE 40 MG/5ML PO SUSR
40.0000 mg | Freq: Two times a day (BID) | ORAL | Status: DC | PRN
  Administered 2015-03-21: 40 mg via ORAL
  Filled 2015-03-21 (×2): qty 5

## 2015-03-21 MED ORDER — CHLORHEXIDINE GLUCONATE 4 % EX LIQD
60.0000 mL | Freq: Once | CUTANEOUS | Status: DC
  Filled 2015-03-21: qty 60

## 2015-03-21 MED ORDER — OXYCODONE HCL 5 MG PO TABS: 5.0000 mg | ORAL_TABLET | Freq: Once | ORAL | Status: DC | PRN

## 2015-03-21 MED ORDER — CEFAZOLIN SODIUM-DEXTROSE 2-3 GM-% IV SOLR
2.0000 g | INTRAVENOUS | Status: AC
  Administered 2015-03-21: 2 g via INTRAVENOUS

## 2015-03-21 MED ORDER — VITAMIN C 500 MG PO TABS
250.0000 mg | ORAL_TABLET | Freq: Every day | ORAL | Status: DC
  Administered 2015-03-21 – 2015-03-22 (×2): 250 mg via ORAL
  Filled 2015-03-21 (×2): qty 1

## 2015-03-21 MED ORDER — ZINC GLUCONATE 50 MG PO TABS: 50.0000 mg | ORAL_TABLET | Freq: Every day | ORAL | Status: DC

## 2015-03-21 MED ORDER — SODIUM CHLORIDE 0.9 % IV SOLN: INTRAVENOUS | Status: AC

## 2015-03-21 MED ORDER — MUPIROCIN 2 % EX OINT
TOPICAL_OINTMENT | CUTANEOUS | Status: AC
  Filled 2015-03-21: qty 22

## 2015-03-21 MED ORDER — CARVEDILOL 25 MG PO TABS
25.0000 mg | ORAL_TABLET | Freq: Two times a day (BID) | ORAL | Status: DC
  Administered 2015-03-21 – 2015-03-22 (×2): 25 mg via ORAL
  Filled 2015-03-21 (×2): qty 1

## 2015-03-21 MED ORDER — SPIRONOLACTONE 25 MG PO TABS
12.5000 mg | ORAL_TABLET | Freq: Every day | ORAL | Status: DC
  Administered 2015-03-21 – 2015-03-22 (×2): 12.5 mg via ORAL
  Filled 2015-03-21 (×2): qty 1

## 2015-03-21 MED ORDER — SODIUM CHLORIDE 0.9 % IR SOLN
80.0000 mg | Status: AC
  Administered 2015-03-21: 80 mg
  Filled 2015-03-21: qty 2

## 2015-03-21 MED ORDER — LIDOCAINE HCL (CARDIAC) 20 MG/ML IV SOLN
INTRAVENOUS | Status: DC | PRN
  Administered 2015-03-21: 40 mg via INTRAVENOUS

## 2015-03-21 MED ORDER — FUROSEMIDE 40 MG PO TABS
40.0000 mg | ORAL_TABLET | Freq: Every day | ORAL | Status: DC
  Administered 2015-03-21 – 2015-03-22 (×2): 40 mg via ORAL
  Filled 2015-03-21 (×2): qty 1

## 2015-03-21 MED ORDER — ONDANSETRON HCL 4 MG/2ML IJ SOLN: 4.0000 mg | Freq: Four times a day (QID) | INTRAMUSCULAR | Status: DC | PRN

## 2015-03-21 MED ORDER — ONDANSETRON HCL 4 MG/2ML IJ SOLN: 4.0000 mg | Freq: Once | INTRAMUSCULAR | Status: DC | PRN

## 2015-03-21 MED ORDER — SODIUM CHLORIDE 0.9 % IR SOLN
Status: AC
  Filled 2015-03-21: qty 2

## 2015-03-21 MED ORDER — SACUBITRIL-VALSARTAN 49-51 MG PO TABS
1.0000 | ORAL_TABLET | Freq: Two times a day (BID) | ORAL | Status: DC
  Administered 2015-03-21 – 2015-03-22 (×2): 1 via ORAL
  Filled 2015-03-21 (×3): qty 1

## 2015-03-21 MED ORDER — CEFAZOLIN SODIUM 1-5 GM-% IV SOLN
1.0000 g | Freq: Four times a day (QID) | INTRAVENOUS | Status: AC
  Administered 2015-03-21 – 2015-03-22 (×3): 1 g via INTRAVENOUS
  Filled 2015-03-21 (×3): qty 50

## 2015-03-21 MED ORDER — BUPIVACAINE HCL (PF) 0.25 % IJ SOLN
INTRAMUSCULAR | Status: DC | PRN
  Administered 2015-03-21: 53 mL

## 2015-03-21 MED ORDER — SODIUM CHLORIDE 0.9 % IV SOLN
INTRAVENOUS | Status: DC
  Administered 2015-03-21 (×2): via INTRAVENOUS

## 2015-03-21 MED ORDER — ASPIRIN EC 81 MG PO TBEC
81.0000 mg | DELAYED_RELEASE_TABLET | Freq: Every day | ORAL | Status: DC
  Administered 2015-03-22: 81 mg via ORAL
  Filled 2015-03-21: qty 1

## 2015-03-21 MED ORDER — OXYCODONE HCL 5 MG/5ML PO SOLN: 5.0000 mg | Freq: Once | ORAL | Status: DC | PRN

## 2015-03-21 MED ORDER — PROPOFOL 10 MG/ML IV BOLUS
INTRAVENOUS | Status: DC | PRN
  Administered 2015-03-21: 100 mg via INTRAVENOUS

## 2015-03-21 MED ORDER — DEXAMETHASONE SODIUM PHOSPHATE 4 MG/ML IJ SOLN
INTRAMUSCULAR | Status: DC | PRN
  Administered 2015-03-21: 4 mg via INTRAVENOUS

## 2015-03-21 SURGICAL SUPPLY — 8 items
ATTRACTOMAT 16X20 MAGNETIC DRP (DRAPES) ×2 IMPLANT
BLANKET WARM UNDERBOD FULL ACC (MISCELLANEOUS) ×2 IMPLANT
CABLE SURGICAL S-101-97-12 (CABLE) ×2 IMPLANT
HEMOSTAT SURGICEL 2X4 FIBR (HEMOSTASIS) ×2 IMPLANT
ICD SUBQ EMBLEM S-ICD 3401 (Lead) ×2 IMPLANT
ICD SUBQ EMBLEM S-PULSE A209 (ICD Generator) ×2 IMPLANT
PAD DEFIB LIFELINK (PAD) ×4 IMPLANT
TRAY PACEMAKER INSERTION (CUSTOM PROCEDURE TRAY) ×2 IMPLANT

## 2015-03-21 NOTE — Interval H&P Note (Signed)
ICD Criteria  Current LVEF:15%. Within 12 months prior to implant: Yes   Heart failure history: Yes, Class III  Cardiomyopathy history: Yes, Non-Ischemic Cardiomyopathy.  Atrial Fibrillation/Atrial Flutter: No.  Ventricular tachycardia history: No.  Cardiac arrest history: No.  History of syndromes with risk of sudden death: No.  Previous ICD: No.  Current ICD indication: Primary  PPM indication: No.   Class I or II Bradycardia indication present: No  Beta Blocker therapy for 3 or more months: Yes, prescribed.   Ace Inhibitor/ARB therapy for 3 or more months: Yes, prescribed.   History and Physical Interval Note:  03/21/2015 7:30 AM  [name]  has presented today for surgery, with the diagnosis of cm  The various methods of treatment have been discussed with the patient and family. After consideration of risks, benefits and other options for treatment, the patient has consented to  Procedure(s): SubQ ICD Implant (N/A) as a surgical intervention .  The patient's history has been reviewed, patient examined, no change in status, stable for surgery.  I have reviewed the patient's chart and labs.  Questions were answered to the patient's satisfaction.     [name]

## 2015-03-21 NOTE — Anesthesia Preprocedure Evaluation (Addendum)
Anesthesia Evaluation  Patient identified by MRN, date of birth, ID band Patient awake    Reviewed: Allergy & Precautions, NPO status , Patient's Chart, lab work & pertinent test results, reviewed documented beta blocker date and time   Airway Mallampati: II  TM Distance: >3 FB Neck ROM: Full    Dental  (+) Dental Advisory Given, Partial Upper, Partial Lower   Pulmonary asthma , sleep apnea and Continuous Positive Airway Pressure Ventilation ,    breath sounds clear to auscultation       Cardiovascular hypertension, Pt. on medications and Pt. on home beta blockers +CHF   Rhythm:Regular  02/08/15- - Left ventricle: The cavity size was severely dilated. Wallthickness was increased in a pattern of moderate LVH. Systolicfunction was severely reduced. The estimated ejection fraction was in the range of 10% to 15%. Diffuse hypokinesis. Doppler parameters are consistent with abnormal left ventricular relaxation (grade 1 diastolic dysfunction). - Mitral valve: There was mild regurgitation. - Left atrium: The atrium was moderately dilated. - Right ventricle: Systolic function was moderately reduced.    Neuro/Psych    GI/Hepatic   Endo/Other    Renal/GU      Musculoskeletal  (+) Arthritis ,   Abdominal   Peds  Hematology   Anesthesia Other Findings   Reproductive/Obstetrics                            Anesthesia Physical Anesthesia Plan  ASA: III  Anesthesia Plan: General   Post-op Pain Management:    Induction: Intravenous  Airway Management Planned: LMA and Oral ETT  Additional Equipment:   Intra-op Plan:   Post-operative Plan: Extubation in OR  Informed Consent: I have reviewed the patients History and Physical, chart, labs and discussed the procedure including the risks, benefits and alternatives for the proposed anesthesia with the patient or authorized representative who has indicated  his/her understanding and acceptance.   Dental advisory given  Plan Discussed with: CRNA and Anesthesiologist  Anesthesia Plan Comments: (Non-ischemic cardiomyopathy presented 4/16 in heart failure, now clinically stable EF 10-15% by last ECHO Hypertension Sleep apnea not presently using CPAP  Plan GA with LMA  Roberts Gaudy)        Anesthesia Quick Evaluation

## 2015-03-21 NOTE — Anesthesia Postprocedure Evaluation (Signed)
  Anesthesia Post-op Note  Patient: Matthew Guerrero  Procedure(s) Performed: Procedure(s): SubQ ICD Implant (N/A)  Patient Location: Endoscopy  Anesthesia Type:General  Level of Consciousness: awake, alert  and oriented  Airway and Oxygen Therapy: Patient Spontanous Breathing and Patient connected to nasal cannula oxygen  Post-op Pain: mild  Post-op Assessment: Post-op Vital signs reviewed, Patient's Cardiovascular Status Stable, Respiratory Function Stable, Patent Airway and Pain level controlled              Post-op Vital Signs: stable  Last Vitals:  Filed Vitals:   03/21/15 1357  BP: 109/66  Pulse: 45  Temp:   Resp: 23    Complications: No apparent anesthesia complications

## 2015-03-21 NOTE — Progress Notes (Signed)
Orthopedic Tech Progress Note Patient Details:  Matthew Guerrero [birth year] [id number]  Ortho Devices Type of Ortho Device: Arm sling Ortho Device/Splint Interventions: Application   [place] Pink 03/21/2015, 12:51 PM

## 2015-03-21 NOTE — H&P (View-Only) (Signed)
ELECTROPHYSIOLOGY CONSULT NOTE  Patient ID: Matthew Guerrero, MRN: [id number], DOB/AGE: Aug 29, 1955 59 y.o. Admit date: (Not on file) Date of Consult: 02/28/2015  Primary Physician: Cathlean Cower, MD Primary Cardiologist: [facility] Chief Complaint:  ICD   HPI Matthew Guerrero is a 59 y.o. male  Referred for consideration of an ICD. He has a history of nonischemic cardiomyopathy. He presented 4/16 with acute heart failure. Ejection fraction was 15-20%. He underwent catheterization demonstrating normal coronary arteries.  Most recent assessment 8/16 demonstrated persistent LV dysfunction.  EF was 10-15%. There is also RV dysfunction.  He has modest exercise intolerance. He denies orthopnea nocturnal dyspnea or peripheral edema.  He has had episodes of presyncope associated with diaphoresis sometimes with palpitations but not always.  He has sleep apnea and is scheduled for fitting of a mask and a couple of weeks.  He has been managed with guidelines directed therapy on beta blockers Entresto and diuretics.    Past Medical History  Diagnosis Date  . ALLERGIC RHINITIS   . GOUT   . HYPERLIPIDEMIA   . HYPERTENSION   . Asthma   . Cervical radiculitis   . Lateral epicondylitis of right elbow   . Hyperglycemia 08/28/2014  . S/P cardiac cath wjith normal coronary arteries  09/28/2014  . Nonischemic cardiomyopathy 09/28/2014  . Chronic systolic heart failure 09/26/6544      Surgical History:  Past Surgical History  Procedure Laterality Date  . Colonoscopy    . Left and right heart catheterization with coronary angiogram N/A 09/27/2014    Procedure: LEFT AND RIGHT HEART CATHETERIZATION WITH CORONARY ANGIOGRAM;  Surgeon: Belva Crome, MD;  Location: [facility] CATH LAB;  Service: Cardiovascular;  Laterality: N/A;     Home Meds: Prior to Admission medications   Medication Sig Start Date End Date Taking? Authorizing Provider  aspirin EC 81 MG EC tablet Take 1 tablet (81 mg total) by mouth daily. 09/28/14   Yes Isaiah Serge, NP  carvedilol (COREG) 25 MG tablet Take 1 tablet (25 mg total) by mouth 2 (two) times daily with a meal. 02/28/15  Yes Biagio Borg, MD  colchicine 0.6 MG tablet Take 1 tablet (0.6 mg total) by mouth daily. 02/28/15  Yes Biagio Borg, MD  furosemide (LASIX) 40 MG tablet Take 1 tablet (40 mg total) by mouth daily. 02/28/15  Yes Biagio Borg, MD  sacubitril-valsartan (ENTRESTO) 49-51 MG Take 1 tablet by mouth 2 (two) times daily. 09/30/14  Yes Pixie Casino, MD  spironolactone (ALDACTONE) 25 MG tablet Take 0.5 tablets (12.5 mg total) by mouth daily. 02/28/15  Yes Biagio Borg, MD  traMADol (ULTRAM) 50 MG tablet Take 1 tablet (50 mg total) by mouth every 8 (eight) hours as needed. 06/22/14  Yes Leonard Schwartz, MD      Allergies: No Known Allergies  Social History   Social History  . Marital Status: Single    Spouse Name: N/A  . Number of Children: N/A  . Years of Education: N/A   Occupational History  . Not on file.   Social History Main Topics  . Smoking status: Never Smoker   . Smokeless tobacco: Never Used  . Alcohol Use: No  . Drug Use: No  . Sexual Activity: Not on file   Other Topics Concern  . Not on file   Social History Narrative     Family History  Problem Relation Age of Onset  . Lung cancer Mother   . Diabetes Father   .  Heart disease Father   . Colon cancer Neg Hx   . Esophageal cancer Neg Hx   . Rectal cancer Neg Hx   . Stomach cancer Neg Hx   . Heart attack Father 26  . Hypertension Sister   . Aneurysm Sister 60    brain aneurysm  . Stroke Sister 34     ROS:  Please see the history of present illness.     All other systems reviewed and negative.    Physical Exam: Blood pressure 120/110, pulse 90, height 5\' 6"  (1.676 m), weight 220 lb 3.2 oz (99.882 kg). General: Well developed, well nourished male in no acute distress. Head: Normocephalic, atraumatic, sclera non-icteric, no xanthomas, nares are without discharge. EENT: normal Lymph  Nodes:  none Back: without scoliosis/kyphosis, no CVA tendersness Neck: Negative for carotid bruits. JVD not elevated. Lungs: Clear bilaterally to auscultation without wheezes, rales, or rhonchi. Breathing is unlabored. Heart: RRR with S1 S2. No  murmur , rubs, or gallops appreciated. Abdomen: Soft, non-tender, non-distended with normoactive bowel sounds. No hepatomegaly. No rebound/guarding. No obvious abdominal masses. Msk:  Strength and tone appear normal for age. Extremities: No clubbing or cyanosis.  tr edema.  Distal pedal pulses are 2+ and equal bilaterally. Skin: Warm and Dry Neuro: Alert and oriented X 3. CN III-XII intact Grossly normal sensory and motor function . Psych:  Responds to questions appropriately with a normal affect.      Labs: Cardiac Enzymes No results for input(s): CKTOTAL, CKMB, TROPONINI in the last 72 hours. CBC Lab Results  Component Value Date   WBC 8.5 02/15/2015   HGB 18.0* 02/15/2015   HCT 56.0* 02/15/2015   MCV 88.5 02/15/2015   PLT 141 02/15/2015   PROTIME: No results for input(s): LABPROT, INR in the last 72 hours. Chemistry No results for input(s): NA, K, CL, CO2, BUN, CREATININE, CALCIUM, PROT, BILITOT, ALKPHOS, ALT, AST, GLUCOSE in the last 168 hours.  Invalid input(s): LABALBU Lipids Lab Results  Component Value Date   CHOL 158 08/24/2014   HDL 35.10* 08/24/2014   LDLCALC 95 08/24/2014   TRIG 142.0 08/24/2014   BNP No results found for: PROBNP Thyroid Function Tests: No results for input(s): TSH, T4TOTAL, T3FREE, THYROIDAB in the last 72 hours.  Invalid input(s): FREET3    Miscellaneous No results found for: DDIMER  Radiology/Studies:  No results found.  EKG: sinus @ 90  16/11/38    Assessment and Plan:  Nonischemic cardiomyopathy  Congestive heart failure-colonic-class II systolic  Hypertension   The patient has severe LV dysfunction  most recently measured  8/16. He has class II heart failure. He is  appropriately considered for ICD implantation for primary prevention.   We have discussed the relative benefits and merits of transvenous versus subcutaneous ICD implantation.  The advantages of the former including battery longevity, history derived from use in randomized controlled trials and perhaps a somewhat lower rate of inappropriate ICD discharges. Advantages of the latter  include the fact that it is extravascular,  Resulting different implications of device infection and it being non-transvalvular.    He would prefer consideration forSICD.  Have reviewed the potential benefits and risks of ICD implantation including but not limited to death, perforation of heart or lung, lead dislodgement, infection,  device malfunction and inappropriate shocks.  The patient  express understanding  and are willing to proceed.  \   He is to get sleep study   [name]

## 2015-03-21 NOTE — Discharge Summary (Signed)
ELECTROPHYSIOLOGY PROCEDURE DISCHARGE SUMMARY    Patient ID: Matthew Guerrero,  MRN: [id number], DOB/AGE: 60-Dec-1957 60 y.o.  Admit date: 03/21/2015 Discharge date: 03/22/2015  Primary Care Physician: Cathlean Cower, MD Primary Cardiologist: [facility] Electrophysiologist: Caryl Comes  Primary Discharge Diagnosis:  NICM s/p ICD implantation this admission  Secondary Discharge Diagnosis:  1.  OSA 2.  Hypertension 3.  Hyperlipidemia 4.  Chronic systolic heart failure  No Known Allergies   Procedures This Admission:  1.  Implantation of a BSX S-ICD on 03/21/15 by Dr Caryl Comes. See op note for full details.  DFT's were successful at 18 J.  There were no immediate post procedure complications. 2.  CXR on 03/22/15 demonstrated no pneumothorax status post device implantation.   Brief HPI: Matthew Guerrero is a 59 y.o. male was referred to electrophysiology in the outpatient setting for consideration of ICD implantation.  Past medical history includes NICM.  The patient has persistent LV dysfunction despite guideline directed therapy.  Risks, benefits, and alternatives to ICD implantation were reviewed with the patient who wished to proceed.   Hospital Course:  The patient was admitted and underwent implantation of a BSX S-ICD with details as outlined above. He was monitored on telemetry overnight which demonstrated sinus rhythm.  Left axillae was without hematoma or ecchymosis.  The device was interrogated and found to be functioning normally.  CXR was obtained and demonstrated no pneumothorax status post device implantation.  Wound care, arm mobility, and restrictions were reviewed with the patient.  The patient was examined and considered stable for discharge to home.   The patient's discharge medications include an ARB (Valsartan) and beta blocker (Coreg).   Physical Exam: Filed Vitals:   03/21/15 1900 03/21/15 2029 03/21/15 2052 03/22/15 0400  BP: 102/70 99/70  111/71  Pulse:      Temp:   98 F  (36.7 C) 98.4 F (36.9 C)  TempSrc:   Oral Oral  Resp: 21 21  20   Height:      Weight:    225 lb 1.4 oz (102.1 kg)  SpO2: 95% 96% 96% 97%    GEN- The patient is well appearing, alert and oriented x 3 today.   HEENT: normocephalic, atraumatic; sclera clear, conjunctiva pink; hearing intact; oropharynx clear; neck supple Lungs- Clear to ausculation bilaterally, normal work of breathing.  No wheezes, rales, rhonchi Heart- Regular rate and rhythm  GI- soft, non-tender, non-distended, bowel sounds present Extremities- no clubbing, cyanosis, or edema; DP/PT/radial pulses 2+ bilaterally MS- no significant deformity or atrophy Skin- warm and dry, no rash or lesion, left axillae without hematoma/ecchymosis Psych- euthymic mood, full affect Neuro- strength and sensation are intact   Labs:   Lab Results  Component Value Date   WBC 6.6 03/19/2015   HGB 18.3* 03/19/2015   HCT 56.0* 03/19/2015   MCV 87.5 03/19/2015   PLT 127.0* 03/19/2015    No results for input(s): NA, K, CL, CO2, BUN, CREATININE, CALCIUM, PROT, BILITOT, ALKPHOS, ALT, AST, GLUCOSE in the last 168 hours.  Invalid input(s): LABALBU  Discharge Medications:    Medication List    TAKE these medications        aspirin 81 MG EC tablet  Take 1 tablet (81 mg total) by mouth daily.     carvedilol 25 MG tablet  Commonly known as:  COREG  Take 1 tablet (25 mg total) by mouth 2 (two) times daily with a meal.     colchicine 0.6 MG tablet  Take 1 tablet (0.6  mg total) by mouth daily.     furosemide 40 MG tablet  Commonly known as:  LASIX  Take 1 tablet (40 mg total) by mouth daily.     HYDROcodone-acetaminophen 5-325 MG tablet  Commonly known as:  NORCO  Take 1 tablet by mouth every 6 (six) hours as needed for moderate pain.     multivitamin with minerals Tabs tablet  Take 1 tablet by mouth daily.     sacubitril-valsartan 49-51 MG  Commonly known as:  ENTRESTO  Take 1 tablet by mouth 2 (two) times daily.      spironolactone 25 MG tablet  Commonly known as:  ALDACTONE  Take 0.5 tablets (12.5 mg total) by mouth daily.     traMADol 50 MG tablet  Commonly known as:  ULTRAM  Take 1 tablet (50 mg total) by mouth every 8 (eight) hours as needed.     vitamin C 250 MG tablet  Commonly known as:  ASCORBIC ACID  Take 250 mg by mouth daily.     zinc gluconate 50 MG tablet  Take 50 mg by mouth daily.        Disposition:  Discharge Instructions    Diet - low sodium heart healthy    Complete by:  As directed      Increase activity slowly    Complete by:  As directed           Follow-up Information    Follow up with [facility] On 04/01/2015.   Why:  at 11:30AM for wound check   Contact information:   [address] [place] [id number]       Duration of Discharge Encounter: Greater than 30 minutes including physician time.  Signed, Chanetta Marshall, NP 03/22/2015 7:45 AM     Pt seen and examined S/p SICD implant Instructions given

## 2015-03-21 NOTE — Interval H&P Note (Signed)
History and Physical Interval Note:  03/21/2015 7:32 AM  Matthew Guerrero  has presented today for surgery, with the diagnosis of cm  The various methods of treatment have been discussed with the patient and family. After consideration of risks, benefits and other options for treatment, the patient has consented to  Procedure(s): SubQ ICD Implant (N/A) as a surgical intervention .  The patient's history has been reviewed, patient examined, no change in status, stable for surgery.  I have reviewed the patient's chart and labs.  Questions were answered to the patient's satisfaction.     Matthew Guerrero  His hemogram was abnormal with low platlets and increasing HgB   I spoke with Dr Matthew Guerrero and we repeated the CBC showing normalization of plts ( sortof) but HgB remains >18   This may represent a myeloproliferative disease and will require evaluation but not thought the prognosis was limiting Have reviewed with pt

## 2015-03-21 NOTE — Anesthesia Procedure Notes (Signed)
Procedure Name: LMA Insertion Date/Time: 03/21/2015 8:12 AM Performed by: Merrilyn Puma B Pre-anesthesia Checklist: Patient identified, Timeout performed, Emergency Drugs available, Suction available and Patient being monitored Patient Re-evaluated:Patient Re-evaluated prior to inductionOxygen Delivery Method: Circle system utilized Preoxygenation: Pre-oxygenation with 100% oxygen Intubation Type: IV induction Ventilation: Mask ventilation without difficulty LMA: LMA inserted LMA Size: 5.0 Number of attempts: 1 Placement Confirmation: positive ETCO2 and breath sounds checked- equal and bilateral Tube secured with: Tape Dental Injury: Teeth and Oropharynx as per pre-operative assessment

## 2015-03-21 NOTE — Transfer of Care (Signed)
Immediate Anesthesia Transfer of Care Note  Patient: Matthew Guerrero  Procedure(s) Performed: Procedure(s): SubQ ICD Implant (N/A)  Patient Location: PACU  Anesthesia Type:General  Level of Consciousness: awake, alert  and oriented  Airway & Oxygen Therapy: Patient Spontanous Breathing and Patient connected to nasal cannula oxygen  Post-op Assessment: Report given to RN, Post -op Vital signs reviewed and stable and Patient moving all extremities X 4  Post vital signs: Reviewed and stable  Last Vitals:  Filed Vitals:   03/21/15 0533  BP: 136/93  Pulse: 79  Temp: 36.8 C  Resp: 18    Complications: No apparent anesthesia complications

## 2015-03-22 ENCOUNTER — Other Ambulatory Visit: Payer: Self-pay

## 2015-03-22 DIAGNOSIS — Z7982 Long term (current) use of aspirin: Secondary | ICD-10-CM | POA: Diagnosis not present

## 2015-03-22 DIAGNOSIS — I5022 Chronic systolic (congestive) heart failure: Secondary | ICD-10-CM | POA: Diagnosis not present

## 2015-03-22 DIAGNOSIS — I10 Essential (primary) hypertension: Secondary | ICD-10-CM | POA: Diagnosis not present

## 2015-03-22 DIAGNOSIS — I429 Cardiomyopathy, unspecified: Secondary | ICD-10-CM

## 2015-03-22 MED ORDER — HYDROCODONE-ACETAMINOPHEN 5-325 MG PO TABS: 1.0000 | ORAL_TABLET | Freq: Four times a day (QID) | ORAL | Status: DC | PRN

## 2015-03-22 MED FILL — Bupivacaine HCl Preservative Free (PF) Inj 0.25%: INTRAMUSCULAR | Qty: 60 | Status: AC

## 2015-03-22 NOTE — Discharge Instructions (Signed)
Cardioverter Defibrillator Implantation, Care After Refer to this sheet in the next few weeks. These instructions provide you with information on caring for yourself after your procedure. Your health care provider may also give you more specific instructions. Your treatment has been planned according to current medical practices, but problems sometimes occur. Call your health care provider if you have any problems or questions after your procedure.  WHAT TO EXPECT AFTER THE PROCEDURE  You may feel pain. Some pain is normal. It may last a few days.  A slight bump may be seen over the skin where the device was placed. Sometimes, it is possible to feel the device under the skin. This is normal.  In the months and years afterward, your health care provider will check the device, the leads, and the battery every few months. Eventually, when the battery is low, the device will be replaced. HOME CARE INSTRUCTIONS  Medicines  Take medicines only as directed by your health care provider.  If you were prescribed an antibiotic medicine, finish it all even if you start to feel better.   Do not take any other medicines without asking your health care provider first. Some medicines, including certain painkillers, can cause bleeding after surgery.  Wound Care  Do not remove the bandage on your chest until directed to do so by your health care provider.  Once your bandage is removed, you may see pieces of tape called skin adhesive strips over the area where the cut was made (incision site). Let them fall off on their own.   Check the incision site every day to make sure it is not infected, bleeding, or starting to pull apart.  Do not use lotions or ointments near the incision site unless directed to do so.   Keep the incision area clean and dry for 2-3 days after the procedure or as directed by your health care provider. It takes several weeks for the incision site to completely heal.   Do not  take baths, swim, or use a hot tub until your health care provider approves. Activities  Try to walk a little every day. Exercising is important after this procedure. It is also important to use your shoulder on the side of the pacemaker in daily tasks that do not require exaggerated motion.  Avoid sudden jerking, pulling, or chopping movements that pull your upper arm far away from your body for at least 6 weeks.  Do not lift your upper arm above your shoulders for at least 6 weeks. This means no tennis, golf, or swimming for this period of time. If you sleep with the arm above your head, use a restraint to prevent this from happening as you sleep.  You may go back to work when your health care provider says it is okay. Check with your health care provider before you start to drive or play sports.  Other Instructions  Follow diet instructions if they were provided. You should be able to eat what you usually do right away, but you may need to limit your salt intake.   Weigh yourself every day. If you suddenly gain weight, fluid may be building up in your body.   Always carry your pacemaker identification card with you. The card should list the implant date, device model, and manufacturer. Consider wearing a medical alert bracelet or necklace.  Tell all health care providers that you have a pacemaker. This may prevent them from giving you a magnetic resonance imaging (MRI) scan because of  the strong magnets used during that test.  If you must pass through a metal detector, quickly walk through it. Do not stop under the detector or stand near it.  Avoid places or objects with a strong electric or magnetic field, including:   Engineer, maintenance. When at the airport, let officials know you have a pacemaker. Your ID card will let you be checked in a way that is safe for you and that will not damage your pacemaker. Also, do not let a security person wave a magnetic wand near your  pacemaker. That can make it stop working.  Power plants.   Large electrical generators.   Radiofrequency transmission towers, such as cell phone and radio towers.   Do not use amateur (ham) radio equipment or electric (arc) welding torches. Some devices are safe to use if held at least 1 foot from your pacemaker. These include power tools, lawn mowers, and speakers. If you are unsure of whether something is safe to use, ask your health care provider.   You may safely use electric blankets, heating pads, computers, and microwave ovens.   When using your cell phone, hold it to the ear opposite the pacemaker. Do not leave your cell phone in a pocket over the pacemaker.   Keep all follow-up visits as directed by your health care provider. This is how your health care provider makes sure your chest is healing the way it should. Ask your health care provider when you should come back to have your stitches or staples taken out.   Have your pacemaker checked every 3-6 months or as directed by your health care provider. Most pacemakers last for 4-8 years before a new one is needed. SEEK MEDICAL CARE IF:   You feel one shock in your chest.  You gain weight suddenly.   Your legs or feet swell more than they have before.   It feels like your heart is fluttering or skipping beats (heart palpitations).  You have a fever. SEEK IMMEDIATE MEDICAL CARE IF:   You have chest pain.  You feel more than one shock.  You feel more short of breath than you have felt before.  You feel more light-headed than you have felt before.  You have problems with your incision site, such as swelling or bleeding, or it starts to open up.   You notice signs of infection around your incision site. Watch for:   Warmth.   Redness.   Worsening pain.   Swelling.   Fluid leaking from the incision site.  Document Released: 12/26/2004 Document Revised: 10/23/2013 Document Reviewed:  06/29/2012 [facility] Patient Information 2015 [place], [place]. This information is not intended to replace advice given to you by your health care provider. Make sure you discuss any questions you have with your health care provider.   Cardiac Diet This diet can help prevent heart disease and stroke. Many factors influence your heart health, including eating and exercise habits. Coronary risk rises a lot with abnormal blood fat (lipid) levels. Cardiac meal planning includes limiting unhealthy fats, increasing healthy fats, and making other small dietary changes. General guidelines are as follows:  Adjust calorie intake to reach and maintain desirable body weight.  Limit total fat intake to less than 30% of total calories. Saturated fat should be less than 7% of calories.  Saturated fats are found in animal products and in some vegetable products. Saturated vegetable fats are found in coconut oil, cocoa butter, palm oil, and palm kernel oil. Read  labels carefully to avoid these products as much as possible. Use butter in moderation. Choose tub margarines and oils that have 2 grams of fat or less. Good cooking oils are canola and olive oils.  Practice low-fat cooking techniques. Do not fry food. Instead, broil, bake, boil, steam, grill, roast on a rack, stir-fry, or microwave it. Other fat reducing suggestions include:  Remove the skin from poultry.  Remove all visible fat from meats.  Skim the fat off stews, soups, and gravies before serving them.  Steam vegetables in water or broth instead of sauting them in fat.  Avoid foods with trans fat (or hydrogenated oils), such as commercially fried foods and commercially baked goods. Commercial shortening and deep-frying fats will contain trans fat.  Increase intake of fruits, vegetables, whole grains, and legumes to replace foods high in fat.  Increase consumption of nuts, legumes, and seeds to at least 4 servings weekly. One serving of a legume  equals  cup, and 1 serving of nuts or seeds equals  cup.  Choose whole grains more often. Have 3 servings per day (a serving is 1 ounce [oz]).  Eat 4 to 5 servings of vegetables per day. A serving of vegetables is 1 cup of raw leafy vegetables;  cup of raw or cooked cut-up vegetables;  cup of vegetable juice.  Eat 4 to 5 servings of fruit per day. A serving of fruit is 1 medium whole fruit;  cup of dried fruit;  cup of fresh, frozen, or canned fruit;  cup of 100% fruit juice.  Increase your intake of dietary fiber to 20 to 30 grams per day. Insoluble fiber may help lower your risk of heart disease and may help curb your appetite.  Soluble fiber binds cholesterol to be removed from the blood. Foods high in soluble fiber are dried beans, citrus fruits, oats, apples, bananas, broccoli, Brussels sprouts, and eggplant.  Try to include foods fortified with plant sterols or stanols, such as yogurt, breads, juices, or margarines. Choose several fortified foods to achieve a daily intake of 2 to 3 grams of plant sterols or stanols.  Foods with omega-3 fats can help reduce your risk of heart disease. Aim to have a 3.5 oz portion of fatty fish twice per week, such as salmon, mackerel, albacore tuna, sardines, lake trout, or herring. If you wish to take a fish oil supplement, choose one that contains 1 gram of both DHA and EPA.  Limit processed meats to 2 servings (3 oz portion) weekly.  Limit the sodium in your diet to 1500 milligrams (mg) per day. If you have high blood pressure, talk to a registered dietitian about a DASH (Dietary Approaches to Stop Hypertension) eating plan.  Limit sweets and beverages with added sugar, such as soda, to no more than 5 servings per week. One serving is:   1 tablespoon sugar.  1 tablespoon jelly or jam.   cup sorbet.  1 cup lemonade.   cup regular soda. CHOOSING FOODS Starches  Allowed: Breads: All kinds (wheat, rye, raisin, white, oatmeal, [place],  [place], and English muffin bread). Low-fat rolls: English muffins, frankfurter and hamburger buns, bagels, pita bread, tortillas (not fried). Pancakes, waffles, biscuits, and muffins made with recommended oil.  Avoid: Products made with saturated or trans fats, oils, or whole milk products. Butter rolls, cheese breads, croissants. Commercial doughnuts, muffins, sweet rolls, biscuits, waffles, pancakes, store-bought mixes. Crackers  Allowed: Low-fat crackers and snacks: Animal, graham, rye, saltine (with recommended oil, no lard), oyster, and  matzo crackers. Bread sticks, melba toast, rusks, flatbread, pretzels, and light popcorn.  Avoid: High-fat crackers: cheese crackers, butter crackers, and those made with coconut, palm oil, or trans fat (hydrogenated oils). Buttered popcorn. Cereals  Allowed: Hot or cold whole-grain cereals.  Avoid: Cereals containing coconut, hydrogenated vegetable fat, or animal fat. Potatoes / Pasta / Rice  Allowed: All kinds of potatoes, rice, and pasta (such as macaroni, spaghetti, and noodles).  Avoid: Pasta or rice prepared with cream sauce or high-fat cheese. Chow mein noodles, [place] fries. Vegetables  Allowed: All vegetables and vegetable juices.  Avoid: Fried vegetables. Vegetables in cream, butter, or high-fat cheese sauces. Limit coconut. Fruit in cream or custard. Protein  Allowed: Limit your intake of meat, seafood, and poultry to no more than 6 oz (cooked weight) per day. All lean, well-trimmed beef, veal, pork, and lamb. All chicken and [place] without skin. All fish and shellfish. Wild game: wild duck, rabbit, pheasant, and venison. Egg whites or low-cholesterol egg substitutes may be used as desired. Meatless dishes: recipes with dried beans, peas, lentils, and tofu (soybean curd). Seeds and nuts: all seeds and most nuts.  Avoid: Prime grade and other heavily marbled and fatty meats, such as short ribs, spare ribs, rib eye roast or steak,  frankfurters, sausage, bacon, and high-fat luncheon meats, mutton. Caviar. Commercially fried fish. Domestic duck, goose, venison sausage. Organ meats: liver, gizzard, heart, chitterlings, brains, kidney, sweetbreads. Dairy  Allowed: Low-fat cheeses: nonfat or low-fat cottage cheese (1% or 2% fat), cheeses made with part skim milk, such as mozzarella, farmers, string, or ricotta. (Cheeses should be labeled no more than 2 to 6 grams fat per oz.). Skim (or 1%) milk: liquid, powdered, or evaporated. Buttermilk made with low-fat milk. Drinks made with skim or low-fat milk or cocoa. Chocolate milk or cocoa made with skim or low-fat (1%) milk. Nonfat or low-fat yogurt.  Avoid: Whole milk cheeses, including colby, cheddar, mu[place], Monterey Jack, [place], [place], [place], American, Swiss, and blue. Creamed cottage cheese, cream cheese. Whole milk and whole milk products, including buttermilk or yogurt made from whole milk, drinks made from whole milk. Condensed milk, evaporated whole milk, and 2% milk. Soups and Combination Foods  Allowed: Low-fat low-sodium soups: broth, dehydrated soups, homemade broth, soups with the fat removed, homemade cream soups made with skim or low-fat milk. Low-fat spaghetti, lasagna, chili, and Spanish rice if low-fat ingredients and low-fat cooking techniques are used.  Avoid: Cream soups made with whole milk, cream, or high-fat cheese. All other soups. Desserts and Sweets  Allowed: Sherbet, fruit ices, gelatins, meringues, and angel food cake. Homemade desserts with recommended fats, oils, and milk products. Jam, jelly, honey, marmalade, sugars, and syrups. Pure sugar candy, such as gum drops, hard candy, jelly beans, marshmallows, mints, and small amounts of dark chocolate.  Avoid: Commercially prepared cakes, pies, cookies, frosting, pudding, or mixes for these products. Desserts containing whole milk products, chocolate, coconut, lard, palm oil, or palm kernel oil. Ice  cream or ice cream drinks. Candy that contains chocolate, coconut, butter, hydrogenated fat, or unknown ingredients. Buttered syrups. Fats and Oils  Allowed: Vegetable oils: safflower, sunflower, corn, soybean, cottonseed, sesame, canola, olive, or peanut. Non-hydrogenated margarines. Salad dressing or mayonnaise: homemade or commercial, made with a recommended oil. Low or nonfat salad dressing or mayonnaise.  Limit added fats and oils to 6 to 8 tsp per day (includes fats used in cooking, baking, salads, and spreads on bread). Remember to count the "hidden fats" in foods.  Avoid:  Solid fats and shortenings: butter, lard, salt pork, bacon drippings. Gravy containing meat fat, shortening, or suet. Cocoa butter, coconut. Coconut oil, palm oil, palm kernel oil, or hydrogenated oils: these ingredients are often used in bakery products, nondairy creamers, whipped toppings, candy, and commercially fried foods. Read labels carefully. Salad dressings made of unknown oils, sour cream, or cheese, such as blue cheese and Roquefort. Cream, all kinds: half-and-half, light, heavy, or whipping. Sour cream or cream cheese (even if "light" or low-fat). Nondairy cream substitutes: coffee creamers and sour cream substitutes made with palm, palm kernel, hydrogenated oils, or coconut oil. Beverages  Allowed: Coffee (regular or decaffeinated), tea. Diet carbonated beverages, mineral water. Alcohol: Check with your caregiver. Moderation is recommended.  Avoid: Whole milk, regular sodas, and juice drinks with added sugar. Condiments  Allowed: All seasonings and condiments. Cocoa powder. "Cream" sauces made with recommended ingredients.  Avoid: Carob powder made with hydrogenated fats. SAMPLE MENU Breakfast   cup orange juice   cup oatmeal  1 slice toast  1 tsp margarine  1 cup skim milk Lunch  [place] sandwich with 2 oz [place], 2 slices bread  Lettuce and tomato slices  Fresh fruit  Carrot  sticks  Coffee or tea Snack  Fresh fruit or low-fat crackers Dinner  3 oz lean ground beef  1 baked potato  1 tsp margarine   cup asparagus  Lettuce salad  1 tbs non-creamy dressing   cup peach slices  1 cup skim milk Document Released: 03/17/2008 Document Revised: 12/08/2011 Document Reviewed: 08/08/2013 [employer] Patient Information 2015 [place], [place]. This information is not intended to replace advice given to you by your health care provider. Make sure you discuss any questions you have with your health care provider.

## 2015-04-01 ENCOUNTER — Ambulatory Visit (INDEPENDENT_AMBULATORY_CARE_PROVIDER_SITE_OTHER): Payer: 59 | Admitting: *Deleted

## 2015-04-01 DIAGNOSIS — I5022 Chronic systolic (congestive) heart failure: Secondary | ICD-10-CM | POA: Diagnosis not present

## 2015-04-01 LAB — CUP PACEART INCLINIC DEVICE CHECK
MDC IDC PG SERIAL: 118449
MDC IDC SESS DTM: 20161010110950
MDC IDC SET ZONE DETECTION INTERVAL: 300 ms
Zone Setting Detection Interval: 250 ms

## 2015-04-01 NOTE — Progress Notes (Signed)
Subcutaneous ICD check in clinic. Wound well healed, without redness, swelling or drainage. Patient educated about wound care and shock plan. 1 untreated episode lasting 13 seconds, rate about 150bpm, EGM shows TWOS. 0 treated episodes; 0 shocks delivered. Electrode impedance status okay. No programming changes today- industry will refer episode to Dr. Caryl Comes. Remaining longevity to ERI 99%.  ROV with SK 06/25/15.

## 2015-04-01 NOTE — Patient Instructions (Signed)
Clean incision with soap and water. Avoid lotions or ointments over incision. Call [facility] if incision becomes red, swollen or if you notice drainage form incision. Follow clinic shock plan.  Return to office to see Dr. Caryl Comes 06-25-15 at 9:15am.

## 2015-04-01 NOTE — Progress Notes (Deleted)
Subcutaneous ICD check in clinic. Wound well healed, without redness, swelling or drainage. Patient educated about wound care and shock plan. 1 untreated episode lasting 13 seconds, TWOS. 0 treated episodes; 0 shocks delivered. Electrode impedance status okay. No programming changes. Remaining longevity to ERI 99%.

## 2015-04-02 ENCOUNTER — Ambulatory Visit (INDEPENDENT_AMBULATORY_CARE_PROVIDER_SITE_OTHER): Payer: 59 | Admitting: *Deleted

## 2015-04-02 DIAGNOSIS — Z95810 Presence of automatic (implantable) cardiac defibrillator: Secondary | ICD-10-CM

## 2015-04-02 LAB — CUP PACEART INCLINIC DEVICE CHECK
Date Time Interrogation Session: 20161011085233
MDC IDC PG SERIAL: 118449

## 2015-04-02 NOTE — Progress Notes (Signed)
SICD check for reprogramming due to oversensing. Industry made the following change: sensing configuration changed from Secondary to Alternate.  ROV with SK 06/25/15.

## 2015-04-18 ENCOUNTER — Telehealth: Payer: Self-pay | Admitting: Internal Medicine

## 2015-04-18 NOTE — Telephone Encounter (Signed)
10.27.16 Telephone note made in regards to referral questions. Talked with [place] and referral for oncology has been sent. Please advise MS

## 2015-04-24 ENCOUNTER — Telehealth: Payer: Self-pay

## 2015-04-24 NOTE — Telephone Encounter (Signed)
Started via covermymeds 

## 2015-04-25 ENCOUNTER — Encounter: Payer: Self-pay | Admitting: Cardiology

## 2015-04-27 NOTE — Telephone Encounter (Signed)
PA has been approved.   Contacted pt and informed.

## 2015-05-06 ENCOUNTER — Encounter: Payer: Self-pay | Admitting: Internal Medicine

## 2015-05-15 ENCOUNTER — Ambulatory Visit (INDEPENDENT_AMBULATORY_CARE_PROVIDER_SITE_OTHER): Payer: 59 | Admitting: Internal Medicine

## 2015-05-15 ENCOUNTER — Encounter: Payer: Self-pay | Admitting: Internal Medicine

## 2015-05-15 VITALS — BP 126/90 | HR 71 | Ht 71.0 in | Wt 225.0 lb

## 2015-05-15 DIAGNOSIS — Z9889 Other specified postprocedural states: Secondary | ICD-10-CM

## 2015-05-15 DIAGNOSIS — I5022 Chronic systolic (congestive) heart failure: Secondary | ICD-10-CM | POA: Diagnosis not present

## 2015-05-15 DIAGNOSIS — I10 Essential (primary) hypertension: Secondary | ICD-10-CM | POA: Diagnosis not present

## 2015-05-15 DIAGNOSIS — I429 Cardiomyopathy, unspecified: Secondary | ICD-10-CM

## 2015-05-15 DIAGNOSIS — I428 Other cardiomyopathies: Secondary | ICD-10-CM

## 2015-05-15 MED ORDER — SPIRONOLACTONE 25 MG PO TABS: 12.5000 mg | ORAL_TABLET | Freq: Every day | ORAL | Status: DC

## 2015-05-15 NOTE — Progress Notes (Signed)
OFFICE NOTE  Chief Complaint:  Routine follow-up  Primary Care Physician: Cathlean Cower, MD  HPI:  Matthew Guerrero is a 58 y.o. male with a hx of HTN, HL, gout, asthma. He was seen in the emergency room in late March with abdominal pain and vomiting. ECG was abnormal and a fast scan demonstrated no pericardial effusion but global hypokinesis. Plan was to follow-up as an outpatient. However, he presented back to the emergency room with progressive shortness of breath and chest pain with exertion. Admitted 4/5-4/8 with acute systolic HF. EF was noted to be 15-20% on echo. Troponin was minimally elevated (0.14). He was diuresed. LHC was arranged and demonstrated normal coronary arteries. He was dx with NICM. Medications were adjusted for CHF and he returns for FU. Of note, ACE inhibitor was stopped and he was placed on Entresto.   Since DC, he is doing well. His breathing is improved. He is NYHA 2b. He denies orthopnea, PND, edema. He denies chest pain, syncope. Does admit to snoring and does take daytime naps. He has a non-productive cough.    Studies/Reports Reviewed Today:  Echo 09/27/14 - Mild LVH. EF 15% to 20%. Diffuse hypokinesis. There is akinesis of the entireanteroseptal myocardium. Grade 1 diastolic dysfunction. There is muscular trabeculation at apex of left ventricle (no obvious thrombus identified). Definity contrast used. - Mitral valve: There was mild regurgitation. - Left atrium: The atrium was moderately dilated. - Right ventricle: The cavity size was moderately dilated. Wall thickness was normal. - Right atrium: The atrium was mildly dilated.  LHC 09/27/14 The left main coronary artery is normal. The left anterior descending artery is normal. The left circumflex artery is normal. The right coronary artery is dominant and normal. EF: 15%.  Matthew Guerrero has an upcoming sleep study which should be helpful hopefully and may be contributing to his  erythrocytosis. I suspect the etiology of his heart failure is hypertensive heart disease. There remains to be seen whether he'll have improvement in LV function. He seems to be tolerating his medications at this point. He does get somewhat fatigued with walking up stairs and doing activities. I suspect a benefit from cardiac rehabilitation.  I saw Matthew Guerrero back in the office today. He is here for a limited echo follow-up which was performed on 02/08/2015. Unfortunately this shows the EF to to remain between 10 and 15%. You have his medication shows near optimal therapy as he is currently on aspirin, carvedilol, Lasix, and Entresto 49/51 mg. He seems to be tolerating the medications and has NYHA class II symptoms.  Matthew Guerrero returns today for follow-up. He recently had placement of an AICD for primary prevention of sudden cardiac death by Dr. Caryl Comes in Mar 14, 2023. This went well and since then he his not had any significant problems. He denies any device firings. He had one episode which was difficult to understand that happened in church but required some reprogramming. Weight is stable at 255 which was his weight in 03-14-23. He continues to be compliant with CPAP and has an appointment to see Dr. Radford Pax for reevaluation in December. He is also seeing Dr. Caryl Comes back in January. He is currently applying for disability. It should be noted that he has class II symptoms and does get short of breath with moderate exertion and is unable to lift more than 10 pounds routinely. His previous job required more physical work than this.  PMHx:  Past Medical History  Diagnosis Date  . ALLERGIC RHINITIS   .  GOUT   . HYPERLIPIDEMIA   . HYPERTENSION   . Asthma   . Cervical radiculitis   . Lateral epicondylitis of right elbow   . Hyperglycemia 08/28/2014  . S/P cardiac cath wjith normal coronary arteries  09/28/2014  . Nonischemic cardiomyopathy ([place]) 09/28/2014  . Chronic systolic heart failure ([place]) 02/28/2015  .  CHF (congestive heart failure) ([place])   . Sleep apnea   . GERD (gastroesophageal reflux disease)     Past Surgical History  Procedure Laterality Date  . Colonoscopy    . Left and right heart catheterization with coronary angiogram N/A 09/27/2014    Procedure: LEFT AND RIGHT HEART CATHETERIZATION WITH CORONARY ANGIOGRAM;  Surgeon: Belva Crome, MD;  Location: [facility] CATH LAB;  Service: Cardiovascular;  Laterality: N/A;  . Subq icd  03/21/2015  . Ep implantable device N/A 03/21/2015    Procedure: SubQ ICD Implant;  Surgeon: Deboraha Sprang, MD;  Location: [place] CV LAB;  Service: Cardiovascular;  Laterality: N/A;    FAMHx:  Family History  Problem Relation Age of Onset  . Lung cancer Mother   . Diabetes Father   . Heart disease Father   . Colon cancer Neg Hx   . Esophageal cancer Neg Hx   . Rectal cancer Neg Hx   . Stomach cancer Neg Hx   . Heart attack Father 68  . Hypertension Sister   . Aneurysm Sister 38    brain aneurysm  . Stroke Sister 61    SOCHx:   reports that he has never smoked. He has never used smokeless tobacco. He reports that he does not drink alcohol or use illicit drugs.  ALLERGIES:  No Known Allergies  ROS: A comprehensive review of systems was negative except for: Respiratory: positive for dyspnea on exertion  HOME MEDS: Current Outpatient Prescriptions  Medication Sig Dispense Refill  . aspirin EC 81 MG EC tablet Take 1 tablet (81 mg total) by mouth daily.    . carvedilol (COREG) 25 MG tablet Take 1 tablet (25 mg total) by mouth 2 (two) times daily with a meal. 180 tablet 1  . colchicine 0.6 MG tablet Take 1 tablet (0.6 mg total) by mouth daily. 90 tablet 1  . furosemide (LASIX) 40 MG tablet Take 1 tablet (40 mg total) by mouth daily. 90 tablet 1  . Multiple Vitamin (MULTIVITAMIN WITH MINERALS) TABS tablet Take 1 tablet by mouth daily.    . sacubitril-valsartan (ENTRESTO) 49-51 MG Take 1 tablet by mouth 2 (two) times daily. 28 tablet 0  .  spironolactone (ALDACTONE) 25 MG tablet Take 0.5 tablets (12.5 mg total) by mouth daily. 45 tablet 1  . traMADol (ULTRAM) 50 MG tablet Take 1 tablet (50 mg total) by mouth every 8 (eight) hours as needed. 270 tablet 1  . vitamin C (ASCORBIC ACID) 250 MG tablet Take 250 mg by mouth daily.     No current facility-administered medications for this visit.    LABS/IMAGING: No results found for this or any previous visit (from the past 48 hour(s)). No results found.  WEIGHTS: Wt Readings from Last 3 Encounters:  05/15/15 225 lb (102.059 kg)  03/22/15 225 lb 1.4 oz (102.1 kg)  03/07/15 213 lb (96.616 kg)    VITALS: BP 126/90 mmHg  Pulse 71  Ht 5\' 11"  (1.803 m)  Wt 225 lb (102.059 kg)  BMI 31.39 kg/m2  EXAM: General appearance: alert and no distress Neck: no carotid bruit, no JVD and thyroid not enlarged, symmetric, no  tenderness/mass/nodules Lungs: clear to auscultation bilaterally Heart: regular rate and rhythm, S1, S2 normal, no murmur, click, rub or gallop and AICD pocket is intact Abdomen: soft, non-tender; bowel sounds normal; no masses,  no organomegaly Extremities: extremities normal, atraumatic, no cyanosis or edema Pulses: 2+ and symmetric Skin: Skin color, texture, turgor normal. No rashes or lesions Neurologic: Grossly normal Psych: Pleasant  EKG: Sinus rhythm at 71  ASSESSMENT: 1. Nonischemic, probably hypertensive cardiomyopathy, EF 10-15% with normal coronaries-NYHA class II symptoms 2. Hypertension-controlled 3. Dyslipidemia 4. OSA on CPAP 5. Gout 6. Status post AICD for primary prevention of sudden cardiac death  PLAN: 1.   Matthew Guerrero continues to do well symptomatically. He has class II symptoms and is restricted in lifting. He does get fatigued fairly easily but generally feels well. He had successful placement of an AICD. He is compliant with his medications and CPAP. His weight is stable and he appears euvolemic on exam today. We'll continue his current  medications and I'll plan to see him back in 6 months.  Pixie Casino, MD, [facility] Attending Cardiologist [place] [facility] 05/15/2015, 9:07 AM

## 2015-05-15 NOTE — Patient Instructions (Addendum)
Your physician wants you to follow-up in: 6 months with Dr. Debara Pickett. You will receive a reminder letter in the mail two months in advance. If you don't receive a letter, please call our office to schedule the follow-up appointment.  Office note printed and placed in mail to patient - quick disclosure documented.

## 2015-05-20 ENCOUNTER — Telehealth: Payer: Self-pay | Admitting: Internal Medicine

## 2015-05-20 NOTE — Telephone Encounter (Signed)
[name] is calling about a letter in which Dr. Debara Pickett was supposed to write for him about his appointment on 05/15/15.[name] Please

## 2015-05-20 NOTE — Telephone Encounter (Signed)
[place] mailed him a copy of the office visit - I suspect that will not be sufficient. I will compose a letter as well. I you can fax it to him, I would appreciate it.  Dr. Lemmie Evens

## 2015-05-20 NOTE — Telephone Encounter (Signed)
Called pt. States we were to write a note/letter about ongoing condition, he is remaining out of work at recommendation.  Was seen on Wednesday 11/23.   Pt provided fax [phone] to send letter.

## 2015-05-20 NOTE — Telephone Encounter (Signed)
Letter faxed to number provided, patient aware.

## 2015-05-20 NOTE — Telephone Encounter (Signed)
Call ... Thanks

## 2015-05-21 ENCOUNTER — Encounter: Payer: Self-pay | Admitting: Hematology and Oncology

## 2015-05-21 ENCOUNTER — Ambulatory Visit (HOSPITAL_BASED_OUTPATIENT_CLINIC_OR_DEPARTMENT_OTHER): Payer: 59 | Admitting: Hematology and Oncology

## 2015-05-21 ENCOUNTER — Telehealth: Payer: Self-pay | Admitting: Hematology and Oncology

## 2015-05-21 ENCOUNTER — Other Ambulatory Visit (HOSPITAL_BASED_OUTPATIENT_CLINIC_OR_DEPARTMENT_OTHER): Payer: 59

## 2015-05-21 VITALS — BP 114/78 | HR 68 | Temp 98.3°F | Resp 18 | Ht 71.0 in | Wt 224.6 lb

## 2015-05-21 DIAGNOSIS — D696 Thrombocytopenia, unspecified: Secondary | ICD-10-CM

## 2015-05-21 DIAGNOSIS — D751 Secondary polycythemia: Secondary | ICD-10-CM

## 2015-05-21 LAB — CBC WITH DIFFERENTIAL/PLATELET
BASO%: 0.7 % (ref 0.0–2.0)
Basophils Absolute: 0 10*3/uL (ref 0.0–0.1)
EOS%: 2.7 % (ref 0.0–7.0)
Eosinophils Absolute: 0.2 10*3/uL (ref 0.0–0.5)
HCT: 54.5 % — ABNORMAL HIGH (ref 38.4–49.9)
HGB: 17.4 g/dL — ABNORMAL HIGH (ref 13.0–17.1)
LYMPH#: 2.6 10*3/uL (ref 0.9–3.3)
LYMPH%: 40.7 % (ref 14.0–49.0)
MCH: 28 pg (ref 27.2–33.4)
MCHC: 32 g/dL (ref 32.0–36.0)
MCV: 87.6 fL (ref 79.3–98.0)
MONO#: 0.4 10*3/uL (ref 0.1–0.9)
MONO%: 6.9 % (ref 0.0–14.0)
NEUT%: 49 % (ref 39.0–75.0)
NEUTROS ABS: 3.1 10*3/uL (ref 1.5–6.5)
PLATELETS: 129 10*3/uL — AB (ref 140–400)
RBC: 6.22 10*6/uL — AB (ref 4.20–5.82)
RDW: 14.3 % (ref 11.0–14.6)
WBC: 6.4 10*3/uL (ref 4.0–10.3)

## 2015-05-21 NOTE — Progress Notes (Signed)
Patient Care Team: Biagio Borg, MD as PCP - General  DIAGNOSIS: secondary polycythemia related to his underlying heart condition as well as obstructive sleep apnea.  CHIEF COMPLIANT: improvement in his breathing   INTERVAL HISTORY: Matthew Guerrero is a 35 with above-mentioned history of elevated hemoglobin related to secondary polycythemia. We are watching and observing his hemoglobin. He is here for a follow-up checkup on his labs. He had a recent AICD implanted for cardiomyopathy. He also uses C Pap machine for obstructive sleep apnea. He describes no new symptoms. He denies any headache blurred vision lightheadedness.  REVIEW OF SYSTEMS:   Constitutional: Denies fevers, chills or abnormal weight loss Eyes: Denies blurriness of vision Ears, nose, mouth, throat, and face: Denies mucositis or sore throat Respiratory: Denies cough, dyspnea or wheezes Cardiovascular: Denies palpitation, chest discomfort or lower extremity swelling Gastrointestinal:  Denies nausea, heartburn or change in bowel habits Skin: Denies abnormal skin rashes Lymphatics: Denies new lymphadenopathy or easy bruising Neurological:Denies numbness, tingling or new weaknesses Behavioral/Psych: Mood is stable, no new changes  All other systems were reviewed with the patient and are negative.  I have reviewed the past medical history, past surgical history, social history and family history with the patient and they are unchanged from previous note.  ALLERGIES:  has No Known Allergies.  MEDICATIONS:  Current Outpatient Prescriptions  Medication Sig Dispense Refill  . aspirin EC 81 MG EC tablet Take 1 tablet (81 mg total) by mouth daily.    . carvedilol (COREG) 25 MG tablet Take 1 tablet (25 mg total) by mouth 2 (two) times daily with a meal. 180 tablet 1  . colchicine 0.6 MG tablet Take 1 tablet (0.6 mg total) by mouth daily. 90 tablet 1  . furosemide (LASIX) 40 MG tablet Take 1 tablet (40 mg total) by mouth daily.  90 tablet 1  . Multiple Vitamin (MULTIVITAMIN WITH MINERALS) TABS tablet Take 1 tablet by mouth daily.    . sacubitril-valsartan (ENTRESTO) 49-51 MG Take 1 tablet by mouth 2 (two) times daily. 28 tablet 0  . spironolactone (ALDACTONE) 25 MG tablet Take 0.5 tablets (12.5 mg total) by mouth daily. 45 tablet 1  . traMADol (ULTRAM) 50 MG tablet Take 1 tablet (50 mg total) by mouth every 8 (eight) hours as needed. 270 tablet 1  . vitamin C (ASCORBIC ACID) 250 MG tablet Take 250 mg by mouth daily.     No current facility-administered medications for this visit.    PHYSICAL EXAMINATION: ECOG PERFORMANCE STATUS: 1 - Symptomatic but completely ambulatory  Filed Vitals:   05/21/15 0851  BP: 114/78  Pulse: 68  Temp: 98.3 F (36.8 C)  Resp: 18   Filed Weights   05/21/15 0851  Weight: 224 lb 9.6 oz (101.878 kg)    GENERAL:alert, no distress and comfortable SKIN: skin color, texture, turgor are normal, no rashes or significant lesions EYES: normal, Conjunctiva are pink and non-injected, sclera clear OROPHARYNX:no exudate, no erythema and lips, buccal mucosa, and tongue normal  NECK: supple, thyroid normal size, non-tender, without nodularity LYMPH:  no palpable lymphadenopathy in the cervical, axillary or inguinal LUNGS: clear to auscultation and percussion with normal breathing effort HEART: regular rate & rhythm and no murmurs and no lower extremity edema ABDOMEN:abdomen soft, non-tender and normal bowel sounds Musculoskeletal:no cyanosis of digits and no clubbing  NEURO: alert & oriented x 3 with fluent speech, no focal motor/sensory deficits   LABORATORY DATA:  I have reviewed the data as listed  Chemistry      Component Value Date/Time   NA 138 03/15/2015 0742   K 4.4 03/15/2015 0742   CL 100 03/15/2015 0742   CO2 27 03/15/2015 0742   BUN 12 03/15/2015 0742   CREATININE 1.22 03/15/2015 0742   CREATININE 1.24 02/11/2015 0812      Component Value Date/Time   CALCIUM 9.6  03/15/2015 0742   ALKPHOS 36* 09/26/2014 0305   AST 26 09/26/2014 0305   ALT 33 09/26/2014 0305   BILITOT 1.2 09/26/2014 0305       Lab Results  Component Value Date   WBC 6.4 05/21/2015   HGB 17.4* 05/21/2015   HCT 54.5* 05/21/2015   MCV 87.6 05/21/2015   PLT 129* 05/21/2015   NEUTROABS 3.1 05/21/2015   ASSESSMENT & PLAN:  Polycythemia, secondary Secondary Polycythemia: Negative JAK2 V617F mutation. Erythropoeitin 18.6 (mildly elevated) 10/26/14 Recommendation: 1. Most likely related to underlying cardiac disease 2. Phlebotomy is not the standard of care for secondary polycythemia. However if his Hb crosses 20, we can consider doing it Today's hemoglobin is 17.4 and platelet count is 129 I provided a copy of these labs to the patient. I'm very happy with the improvement in his hemoglobin. We will continue to watch and monitor him.  Thrombocytopenia: Suspicious for low grade ITP (Large platelets on smear suggest increased destruction). Today is 129. Plan: We'll continue to watch and monitor his counts every 3 months with CBC with differential   No orders of the defined types were placed in this encounter.   The patient has a good understanding of the overall plan. he agrees with it. he will call with any problems that may develop before the next visit here.   Rulon Eisenmenger, MD 05/21/2015

## 2015-05-21 NOTE — Assessment & Plan Note (Signed)
Secondary Polycythemia: Negative JAK2 V617F mutation. Erythropoeitin 18.6 (mildly elevated) 10/26/14 Recommendation: 1. Most likely related to underlying cardiac disease 2. Phlebotomy is not the standard of care for secondary polycythemia. However if his Hb crosses 20, we can consider doing it Today's hemoglobin is 18 and platelet count is 141 Thrombocytopenia: Suspicious for low grade ITP (Large platelets on smear suggest increased destruction). Today is 141. Plan: We'll continue to watch and monitor his counts every 3 months with CBC with differential

## 2015-05-21 NOTE — Telephone Encounter (Signed)
Gave patient avs report and appointments for February.  °

## 2015-05-21 NOTE — Addendum Note (Signed)
Addended by: Prentiss Bells on: 05/21/2015 02:38 PM   Modules accepted: Medications

## 2015-06-06 ENCOUNTER — Ambulatory Visit (INDEPENDENT_AMBULATORY_CARE_PROVIDER_SITE_OTHER): Payer: 59 | Admitting: Cardiology

## 2015-06-06 ENCOUNTER — Encounter: Payer: Self-pay | Admitting: Cardiology

## 2015-06-06 VITALS — BP 120/90 | HR 80 | Ht 71.0 in | Wt 231.0 lb

## 2015-06-06 DIAGNOSIS — E669 Obesity, unspecified: Secondary | ICD-10-CM

## 2015-06-06 DIAGNOSIS — G4733 Obstructive sleep apnea (adult) (pediatric): Secondary | ICD-10-CM | POA: Diagnosis not present

## 2015-06-06 DIAGNOSIS — J351 Hypertrophy of tonsils: Secondary | ICD-10-CM | POA: Diagnosis not present

## 2015-06-06 DIAGNOSIS — I10 Essential (primary) hypertension: Secondary | ICD-10-CM | POA: Diagnosis not present

## 2015-06-06 HISTORY — DX: Obesity, unspecified: E66.9

## 2015-06-06 NOTE — Patient Instructions (Signed)
Medication Instructions:  Your physician recommends that you continue on your current medications as directed. Please refer to the Current Medication list given to you today.   Labwork: None  Testing/Procedures: None  Follow-Up: You have been referred to ENT.  Your physician wants you to follow-up in: 6 months with Dr. Radford Pax. You will receive a reminder letter in the mail two months in advance. If you don't receive a letter, please call our office to schedule the follow-up appointment.   Any Other Special Instructions Will Be Listed Below (If Applicable).     If you need a refill on your cardiac medications before your next appointment, please call your pharmacy.

## 2015-06-06 NOTE — Progress Notes (Signed)
Cardiology Office Note   Date:  06/06/2015   ID:  Matthew Guerrero, DOB 01-31-1956, MRN [id number]  PCP:  Cathlean Cower, MD    Chief Complaint  Patient presents with  . Sleep Apnea      History of Present Illness: Matthew Guerrero is a 59 y.o. male who presents for evaluation of OSA.  He  underwent a PSG due to morning headaches and snoring which showed moderate OSA with an AHI of 23/hr and mlid to moderate snoring.  His oxygen saturations dropped to 71%.  He underwent CPAP titration to 9 cm H2O and is now doing well with his device.  He tolerates his mask and feels the pressure is adequate. Since starting on the CPAP he has noticed an improvement in daytime sleepiness and feels rested in the am.  He does not snore. His headaches have improved with the CPAP.   He has no mouth dryness or congestion.  He does have some nasal dryness.  He uses the nasal mask.      Past Medical History  Diagnosis Date  . ALLERGIC RHINITIS   . GOUT   . HYPERLIPIDEMIA   . HYPERTENSION   . Asthma   . Cervical radiculitis   . Lateral epicondylitis of right elbow   . Hyperglycemia 08/28/2014  . S/P cardiac cath wjith normal coronary arteries  09/28/2014  . Nonischemic cardiomyopathy ([place]) 09/28/2014  . Chronic systolic heart failure ([place]) 02/28/2015  . CHF (congestive heart failure) ([place])   . Sleep apnea   . GERD (gastroesophageal reflux disease)   . Obesity (BMI 30-39.9) 06/06/2015    Past Surgical History  Procedure Laterality Date  . Colonoscopy    . Left and right heart catheterization with coronary angiogram N/A 09/27/2014    Procedure: LEFT AND RIGHT HEART CATHETERIZATION WITH CORONARY ANGIOGRAM;  Surgeon: Belva Crome, MD;  Location: [facility] CATH LAB;  Service: Cardiovascular;  Laterality: N/A;  . Subq icd  03/21/2015  . Ep implantable device N/A 03/21/2015    Procedure: SubQ ICD Implant;  Surgeon: Deboraha Sprang, MD;  Location: [place] CV LAB;  Service: Cardiovascular;  Laterality:  N/A;     Current Outpatient Prescriptions  Medication Sig Dispense Refill  . aspirin EC 81 MG EC tablet Take 1 tablet (81 mg total) by mouth daily.    . carvedilol (COREG) 25 MG tablet Take 1 tablet (25 mg total) by mouth 2 (two) times daily with a meal. 180 tablet 1  . colchicine 0.6 MG tablet Take 1 tablet (0.6 mg total) by mouth daily. 90 tablet 1  . furosemide (LASIX) 40 MG tablet Take 1 tablet (40 mg total) by mouth daily. 90 tablet 1  . Multiple Vitamin (MULTIVITAMIN WITH MINERALS) TABS tablet Take 1 tablet by mouth daily.    . sacubitril-valsartan (ENTRESTO) 49-51 MG Take 1 tablet by mouth 2 (two) times daily. 28 tablet 0  . spironolactone (ALDACTONE) 25 MG tablet Take 0.5 tablets (12.5 mg total) by mouth daily. 45 tablet 1  . traMADol (ULTRAM) 50 MG tablet Take 50 mg by mouth every 8 (eight) hours as needed (pain).     No current facility-administered medications for this visit.    Allergies:   Review of patient's allergies indicates no known allergies.    Social History:  The patient  reports that he has never smoked. He has never used smokeless tobacco. He reports that he  does not drink alcohol or use illicit drugs.   Family History:  The patient's family history includes Aneurysm (age of onset: 37) in his sister; Diabetes in his father; Heart attack (age of onset: 70) in his father; Heart disease in his father; Hypertension in his sister; Lung cancer in his mother; Stroke (age of onset: 79) in his sister. There is no history of Colon cancer, Esophageal cancer, Rectal cancer, or Stomach cancer.    ROS:  Please see the history of present illness.   Otherwise, review of systems are positive for none.   All other systems are reviewed and negative.    PHYSICAL EXAM: VS:  BP 120/90 mmHg  Pulse 80  Ht 5\' 11"  (1.803 m)  Wt 231 lb (104.781 kg)  BMI 32.23 kg/m2  SpO2 95% , BMI Body mass index is 32.23 kg/(m^2). GEN: Well nourished, well developed, in no acute distress HEENT:  markedly enlarged tonsils Neck: no JVD, carotid bruits, or masses Cardiac: RRR; no murmurs, rubs, or gallops,no edema  Respiratory:  clear to auscultation bilaterally, normal work of breathing GI: soft, nontender, nondistended, + BS MS: no deformity or atrophy Skin: warm and dry, no rash Neuro:  Strength and sensation are intact Psych: euthymic mood, full affect   EKG:  EKG is not ordered today.    Recent Labs: 09/25/2014: TSH 2.242 09/26/2014: ALT 33; B Natriuretic Peptide 1293.3*; Magnesium 1.9 03/15/2015: BUN 12; Creatinine, Ser 1.22; Potassium 4.4; Sodium 138 05/21/2015: HGB 17.4*; Platelets 129*    Lipid Panel    Component Value Date/Time   CHOL 158 08/24/2014 0800   TRIG 142.0 08/24/2014 0800   HDL 35.10* 08/24/2014 0800   CHOLHDL 5 08/24/2014 0800   VLDL 28.4 08/24/2014 0800   LDLCALC 95 08/24/2014 0800   LDLDIRECT 105.8 06/18/2010 0806      Wt Readings from Last 3 Encounters:  06/06/15 231 lb (104.781 kg)  05/21/15 224 lb 9.6 oz (101.878 kg)  05/15/15 225 lb (102.059 kg)        ASSESSMENT AND PLAN:  1.  Moderate OSA now on CPAP and tolerating well.  His d/l today showed an AHI of 1.1/hr on 9cm H2O and 100% compliance in using more than 4 hours nightly.  Patient has been using and benefiting from CPAP use and will continue to benefit from therapy.   He has large tonsils on exam so I will refer him to ENT for further evaluation which I suspect is contributing to his OSA.   2.  HTN - borderline controlled 3.  Obesity - I have encouraged him to get into a routine exercise program.   Current medicines are reviewed at length with the patient today.  The patient does not have concerns regarding medicines.  The following changes have been made:  no change  Labs/ tests ordered today: See above Assessment and Plan  Orders Placed This Encounter  Procedures  . Ambulatory referral to ENT     Disposition:   FU with me in  6 months  Signed, Sueanne Margarita, MD    06/06/2015 8:40 AM    [facility] [address], [place], [place]  [address] Phone: [phone]; Fax: [phone]

## 2015-06-11 ENCOUNTER — Encounter: Payer: Self-pay | Admitting: Cardiology

## 2015-06-25 ENCOUNTER — Encounter: Payer: Self-pay | Admitting: Internal Medicine

## 2015-06-25 ENCOUNTER — Ambulatory Visit (INDEPENDENT_AMBULATORY_CARE_PROVIDER_SITE_OTHER): Payer: BLUE CROSS/BLUE SHIELD | Admitting: Internal Medicine

## 2015-06-25 VITALS — BP 116/80 | HR 84 | Ht 71.0 in | Wt 233.2 lb

## 2015-06-25 DIAGNOSIS — I5022 Chronic systolic (congestive) heart failure: Secondary | ICD-10-CM

## 2015-06-25 DIAGNOSIS — Z95810 Presence of automatic (implantable) cardiac defibrillator: Secondary | ICD-10-CM

## 2015-06-25 DIAGNOSIS — I428 Other cardiomyopathies: Secondary | ICD-10-CM

## 2015-06-25 DIAGNOSIS — I429 Cardiomyopathy, unspecified: Secondary | ICD-10-CM

## 2015-06-25 LAB — CUP PACEART INCLINIC DEVICE CHECK
Date Time Interrogation Session: 20170103110410
Implantable Lead Location: 753858
MDC IDC LEAD IMPLANT DT: 20160929
MDC IDC LEAD MODEL: 3401
Pulse Gen Serial Number: 118449

## 2015-06-25 NOTE — Progress Notes (Signed)
ELECTROPHYSIOLOGY OFFICE NOTE  Patient ID: Matthew Guerrero, MRN: [id number], DOB/AGE: 61-Jan-1957 61 y.o. Admit date: (Not on file) Date of Consult: 06/25/2015  Primary Physician: Cathlean Cower, MD Primary Cardiologist: [facility] Chief Complaint:  ICD   HPI Matthew Guerrero is a 60 y.o. male   Seen in follow-up for S ICD implanted for primary prevention 9/16   He has a history of nonischemic cardiomyopathy. He presented 4/16 with acute heart failure. Ejection fraction was 15-20%. He underwent catheterization demonstrating normal coronary arteries.  Most recent assessment 8/16 demonstrated persistent LV dysfunction.  EF was 10-15%. There is also RV dysfunction.  He has had TWOS in the secondary vector  nnow programmed in alternate   .    Past Medical History  Diagnosis Date  . ALLERGIC RHINITIS   . GOUT   . HYPERLIPIDEMIA   . HYPERTENSION   . Asthma   . Cervical radiculitis   . Lateral epicondylitis of right elbow   . Hyperglycemia 08/28/2014  . S/P cardiac cath wjith normal coronary arteries  09/28/2014  . Nonischemic cardiomyopathy ([place]) 09/28/2014  . Chronic systolic heart failure ([place]) 02/28/2015  . CHF (congestive heart failure) ([place])   . Sleep apnea   . GERD (gastroesophageal reflux disease)   . Obesity (BMI 30-39.9) 06/06/2015      Surgical History:  Past Surgical History  Procedure Laterality Date  . Colonoscopy    . Left and right heart catheterization with coronary angiogram N/A 09/27/2014    Procedure: LEFT AND RIGHT HEART CATHETERIZATION WITH CORONARY ANGIOGRAM;  Surgeon: Belva Crome, MD;  Location: [facility] CATH LAB;  Service: Cardiovascular;  Laterality: N/A;  . Subq icd  03/21/2015  . Ep implantable device N/A 03/21/2015    Procedure: SubQ ICD Implant;  Surgeon: Deboraha Sprang, MD;  Location: [facility] CV LAB;  Service: Cardiovascular;  Laterality: N/A;     Home Meds: Prior to Admission medications   Medication Sig Start Date End Date Taking? Authorizing Provider    aspirin EC 81 MG EC tablet Take 1 tablet (81 mg total) by mouth daily. 09/28/14  Yes Isaiah Serge, NP  carvedilol (COREG) 25 MG tablet Take 1 tablet (25 mg total) by mouth 2 (two) times daily with a meal. 02/28/15  Yes Biagio Borg, MD  colchicine 0.6 MG tablet Take 1 tablet (0.6 mg total) by mouth daily. 02/28/15  Yes Biagio Borg, MD  furosemide (LASIX) 40 MG tablet Take 1 tablet (40 mg total) by mouth daily. 02/28/15  Yes Biagio Borg, MD  sacubitril-valsartan (ENTRESTO) 49-51 MG Take 1 tablet by mouth 2 (two) times daily. 09/30/14  Yes Pixie Casino, MD  spironolactone (ALDACTONE) 25 MG tablet Take 0.5 tablets (12.5 mg total) by mouth daily. 02/28/15  Yes Biagio Borg, MD  traMADol (ULTRAM) 50 MG tablet Take 1 tablet (50 mg total) by mouth every 8 (eight) hours as needed. 06/22/14  Yes Leonard Schwartz, MD      Allergies: No Known Allergies  Social History   Social History  . Marital Status: Single    Spouse Name: N/A  . Number of Children: N/A  . Years of Education: N/A   Occupational History  . Not on file.   Social History Main Topics  . Smoking status: Never Smoker   . Smokeless tobacco: Never Used  . Alcohol Use: No  . Drug Use: No  . Sexual Activity: Not on file   Other Topics Concern  . Not on file  Social History Narrative     Family History  Problem Relation Age of Onset  . Lung cancer Mother   . Diabetes Father   . Heart disease Father   . Heart attack Father 62  . Colon cancer Neg Hx   . Esophageal cancer Neg Hx   . Rectal cancer Neg Hx   . Stomach cancer Neg Hx   . Hypertension Sister   . Aneurysm Sister 43    brain aneurysm  . Healthy Brother   . Healthy Brother   . Healthy Brother      ROS:  Please see the history of present illness.     All other systems reviewed and negative.    Physical Exam: Blood pressure 116/80, pulse 84, height 5\' 11"  (1.803 m), weight 233 lb 3.2 oz (105.779 kg). General: Well developed, well nourished male in no acute  distress. Head: Normocephalic, atraumatic, sclera non-icteric, no xanthomas, nares are without discharge. EENT: normal Lymph Nodes:  none Back: without scoliosis/kyphosis, no CVA tendersness Neck: Negative for carotid bruits. JVD not elevated. Lungs: Clear bilaterally to auscultation without wheezes, rales, or rhonchi. Breathing is unlabored. Device pocket well healed; without hematoma or erythema.  There is no tethering  Heart: RRR with S1 S2. No  murmur , rubs, or gallops appreciated. Abdomen: Soft, non-tender, non-distended with normoactive bowel sounds. No hepatomegaly. No rebound/guarding. No obvious abdominal masses. Msk:  Strength and tone appear normal for age. Extremities: No clubbing or cyanosis.  tr edema.  Distal pedal pulses are 2+ and equal bilaterally. Skin: Warm and Dry Neuro: Alert and oriented X 3. CN III-XII intact Grossly normal sensory and motor function . Psych:  Responds to questions appropriately with a normal affect.      Labs: Cardiac Enzymes No results for input(s): CKTOTAL, CKMB, TROPONINI in the last 72 hours. CBC Lab Results  Component Value Date   WBC 6.4 05/21/2015   HGB 17.4* 05/21/2015   HCT 54.5* 05/21/2015   MCV 87.6 05/21/2015   PLT 129* 05/21/2015   PROTIME: No results for input(s): LABPROT, INR in the last 72 hours. Chemistry No results for input(s): NA, K, CL, CO2, BUN, CREATININE, CALCIUM, PROT, BILITOT, ALKPHOS, ALT, AST, GLUCOSE in the last 168 hours.  Invalid input(s): LABALBU Lipids Lab Results  Component Value Date   CHOL 158 08/24/2014   HDL 35.10* 08/24/2014   LDLCALC 95 08/24/2014   TRIG 142.0 08/24/2014   BNP No results found for: PROBNP Thyroid Function Tests: No results for input(s): TSH, T4TOTAL, T3FREE, THYROIDAB in the last 72 hours.  Invalid input(s): FREET3    Miscellaneous No results found for: DDIMER  Radiology/Studies:  No results found.  EKG: sinus @ 90  16/11/38    Assessment and Plan:    Nonischemic cardiomyopathy  Congestive heart failure-colonic-class II systolic  ICD SubQ  Hypertension  bp well controlled  Continue Guideline directed medical therapy   Continue current meds       [name]

## 2015-06-25 NOTE — Patient Instructions (Addendum)
Medication Instructions: -  Labwork: -   Procedures/Testing: -   Follow-Up: - Remote monitoring is used to monitor your Pacemaker of ICD from home. This monitoring reduces the number of office visits required to check your device to one time per year. It allows [place] to keep an eye on the functioning of your device to ensure it is working properly. You are scheduled for a device check from home on 09/24/15. You may send your transmission at any time that day. If you have a wireless device, the transmission will be sent automatically. After your physician reviews your transmission, you will receive a postcard with your next transmission date.  - Your physician wants you to follow-up in: 9 months with Dr. Caryl Comes (October 2017) You will receive a reminder letter in the mail two months in advance. If you don't receive a letter, please call our office to schedule the follow-up appointment.  Any Additional Special Instructions Will Be Listed Below (If Applicable).

## 2015-06-28 ENCOUNTER — Encounter: Payer: Self-pay | Admitting: Cardiology

## 2015-07-01 ENCOUNTER — Other Ambulatory Visit: Payer: Self-pay | Admitting: Internal Medicine

## 2015-07-19 ENCOUNTER — Other Ambulatory Visit: Payer: Self-pay | Admitting: Cardiology

## 2015-08-09 ENCOUNTER — Telehealth: Payer: Self-pay | Admitting: Internal Medicine

## 2015-08-09 NOTE — Telephone Encounter (Signed)
MD completed portion of patient assistance for entresto. LM for patient that there is a portion he will need to complete and also bring in: IRS form 1040/1040EZ, social security statement (1099), paycheck stubs, W-2 forms. Asked for return call to verify receipt of information

## 2015-08-12 ENCOUNTER — Other Ambulatory Visit: Payer: Self-pay | Admitting: Internal Medicine

## 2015-08-12 DIAGNOSIS — I428 Other cardiomyopathies: Secondary | ICD-10-CM

## 2015-08-12 MED ORDER — SPIRONOLACTONE 25 MG PO TABS: 12.5000 mg | ORAL_TABLET | Freq: Every day | ORAL | Status: DC

## 2015-08-12 NOTE — Telephone Encounter (Signed)
Patient came in office to provide necessary documentation for entresto patient assistance. Asked for refill for 90 day supply of spironolactone.  Rx(s) sent to pharmacy electronically.

## 2015-08-12 NOTE — Telephone Encounter (Signed)
Patient came by office and brought in requested paperwork and patient portion of application.   [place] form to [phone]

## 2015-08-19 NOTE — Assessment & Plan Note (Addendum)
Secondary Polycythemia: Negative JAK2 V617F mutation. Erythropoeitin 18.6 (mildly elevated) 10/26/14 Recommendation: 1. Most likely related to underlying cardiac disease 2. Phlebotomy is not the standard of care for secondary polycythemia. However if his Hb crosses 20, we can consider doing it Today's hemoglobin is 17.4 and platelet count is 129 I provided a copy of these labs to the patient. I'm very happy with the improvement in his hemoglobin. We will continue to watch and monitor him.  Thrombocytopenia: Suspicious for low grade ITP (Large platelets on smear suggest increased destruction). Today is 129. Plan: We'll continue to watch and monitor his counts every 3 months with CBC with differential

## 2015-08-20 ENCOUNTER — Other Ambulatory Visit (HOSPITAL_BASED_OUTPATIENT_CLINIC_OR_DEPARTMENT_OTHER): Payer: BLUE CROSS/BLUE SHIELD

## 2015-08-20 ENCOUNTER — Ambulatory Visit (HOSPITAL_BASED_OUTPATIENT_CLINIC_OR_DEPARTMENT_OTHER): Payer: BLUE CROSS/BLUE SHIELD | Admitting: Hematology and Oncology

## 2015-08-20 ENCOUNTER — Telehealth: Payer: Self-pay | Admitting: Hematology and Oncology

## 2015-08-20 ENCOUNTER — Encounter: Payer: Self-pay | Admitting: Hematology and Oncology

## 2015-08-20 VITALS — BP 116/75 | HR 72 | Temp 98.0°F | Resp 18 | Ht 71.0 in | Wt 231.0 lb

## 2015-08-20 DIAGNOSIS — D696 Thrombocytopenia, unspecified: Secondary | ICD-10-CM | POA: Diagnosis not present

## 2015-08-20 DIAGNOSIS — D751 Secondary polycythemia: Secondary | ICD-10-CM

## 2015-08-20 LAB — CBC WITH DIFFERENTIAL/PLATELET
BASO%: 0.2 % (ref 0.0–2.0)
BASOS ABS: 0 10*3/uL (ref 0.0–0.1)
EOS ABS: 0.1 10*3/uL (ref 0.0–0.5)
EOS%: 1.7 % (ref 0.0–7.0)
HEMATOCRIT: 51.8 % — AB (ref 38.4–49.9)
HGB: 17.2 g/dL — ABNORMAL HIGH (ref 13.0–17.1)
LYMPH%: 45.8 % (ref 14.0–49.0)
MCH: 28.8 pg (ref 27.2–33.4)
MCHC: 33.2 g/dL (ref 32.0–36.0)
MCV: 86.6 fL (ref 79.3–98.0)
MONO#: 0.5 10*3/uL (ref 0.1–0.9)
MONO%: 9.2 % (ref 0.0–14.0)
NEUT#: 2.5 10*3/uL (ref 1.5–6.5)
NEUT%: 43.1 % (ref 39.0–75.0)
Platelets: 111 10*3/uL — ABNORMAL LOW (ref 140–400)
RBC: 5.98 10*6/uL — AB (ref 4.20–5.82)
RDW: 14.3 % (ref 11.0–14.6)
WBC: 5.9 10*3/uL (ref 4.0–10.3)
lymph#: 2.7 10*3/uL (ref 0.9–3.3)
nRBC: 0 % (ref 0–0)

## 2015-08-20 NOTE — Telephone Encounter (Signed)
appt made and avs printed °

## 2015-08-20 NOTE — Progress Notes (Signed)
Patient Care Team: Biagio Borg, MD as PCP - General  DIAGNOSIS: Secondary polycythemia and thrombocytopenia (suspicious for low-grade ITP).  CHIEF COMPLIANT: Follow-up on secondary polycythemia  INTERVAL HISTORY: Matthew Guerrero is a 60 year old with above-mentioned history of hemoglobin with negative JAK-2 mutation who is here for three-month follow-up of his elevated hemoglobin and blood counts. We are watching and monitoring his hemoglobin. He was asymptomatic and spit monitoring. If he was to come symptomatic or if his hemoglobin went across 20 and we would consider phlebotomy. He denies any chest pain and lightheadedness dizziness or headaches. He denies any flushing sensation.  REVIEW OF SYSTEMS:   Constitutional: Denies fevers, chills or abnormal weight loss Eyes: Denies blurriness of vision Ears, nose, mouth, throat, and face: Denies mucositis or sore throat Respiratory: Denies cough, dyspnea or wheezes Cardiovascular: Denies palpitation, chest discomfort Gastrointestinal:  Denies nausea, heartburn or change in bowel habits Skin: Denies abnormal skin rashes Lymphatics: Denies new lymphadenopathy or easy bruising Neurological:Denies numbness, tingling or new weaknesses Behavioral/Psych: Mood is stable, no new changes  Extremities: No lower extremity edema All other systems were reviewed with the patient and are negative.  I have reviewed the past medical history, past surgical history, social history and family history with the patient and they are unchanged from previous note.  ALLERGIES:  has No Known Allergies.  MEDICATIONS:  Current Outpatient Prescriptions  Medication Sig Dispense Refill  . aspirin EC 81 MG EC tablet Take 1 tablet (81 mg total) by mouth daily.    . carvedilol (COREG) 25 MG tablet Take 1 tablet (25 mg total) by mouth 2 (two) times daily with a meal. 180 tablet 1  . colchicine 0.6 MG tablet Take 1 tablet (0.6 mg total) by mouth daily. 90 tablet 1  .  ENTRESTO 49-51 MG take 1 tablet by mouth twice a day 60 tablet 5  . furosemide (LASIX) 40 MG tablet Take 1 tablet (40 mg total) by mouth daily. 90 tablet 1  . Multiple Vitamin (MULTIVITAMIN WITH MINERALS) TABS tablet Take 1 tablet by mouth daily.    [name] spironolactone (ALDACTONE) 25 MG tablet Take 0.5 tablets (12.5 mg total) by mouth daily. 45 tablet 1  . traMADol (ULTRAM) 50 MG tablet Take 50 mg by mouth every 8 (eight) hours as needed (pain).     No current facility-administered medications for this visit.    PHYSICAL EXAMINATION: ECOG PERFORMANCE STATUS: 1 - Symptomatic but completely ambulatory  Filed Vitals:   08/20/15 0819  BP: 116/75  Pulse: 72  Temp: 98 F (36.7 C)  Resp: 18   Filed Weights   08/20/15 0819  Weight: 231 lb (104.781 kg)    GENERAL:alert, no distress and comfortable SKIN: skin color, texture, turgor are normal, no rashes or significant lesions EYES: normal, Conjunctiva are pink and non-injected, sclera clear OROPHARYNX:no exudate, no erythema and lips, buccal mucosa, and tongue normal  NECK: supple, thyroid normal size, non-tender, without nodularity LYMPH:  no palpable lymphadenopathy in the cervical, axillary or inguinal LUNGS: clear to auscultation and percussion with normal breathing effort HEART: regular rate & rhythm and no murmurs and no lower extremity edema ABDOMEN:abdomen soft, non-tender and normal bowel sounds MUSCULOSKELETAL:no cyanosis of digits and no clubbing  NEURO: alert & oriented x 3 with fluent speech, no focal motor/sensory deficits EXTREMITIES: No lower extremity edema  LABORATORY DATA:  I have reviewed the data as listed   Chemistry      Component Value Date/Time   NA 138 03/15/2015 0742  K 4.4 03/15/2015 0742   CL 100 03/15/2015 0742   CO2 27 03/15/2015 0742   BUN 12 03/15/2015 0742   CREATININE 1.22 03/15/2015 0742   CREATININE 1.24 02/11/2015 0812      Component Value Date/Time   CALCIUM 9.6 03/15/2015 0742   ALKPHOS  36* 09/26/2014 0305   AST 26 09/26/2014 0305   ALT 33 09/26/2014 0305   BILITOT 1.2 09/26/2014 0305      Lab Results  Component Value Date   WBC 5.9 08/20/2015   HGB 17.2* 08/20/2015   HCT 51.8* 08/20/2015   MCV 86.6 08/20/2015   PLT 111* 08/20/2015   NEUTROABS 2.5 08/20/2015   ASSESSMENT & PLAN:   Secondary Polycythemia: Negative JAK2 V617F mutation. Erythropoeitin 18.6 (mildly elevated) 10/26/14 Recommendation: 1. Most likely related to underlying cardiac disease 2. Phlebotomy is not the standard of care for secondary polycythemia. However if his Hb crosses 20, Or if he develops symptoms of congestion, we can consider doing it.  Today's hemoglobin is 17.2 and platelet count is 111 I provided a copy of these labs to the patient. I'm very happy with the stability of his hemoglobin. We will continue to watch and monitor him.  Thrombocytopenia: Suspicious for low grade ITP (Large platelets on smear suggest increased destruction). Today is 111. I discussed with them that even though the platelet count is slightly lower than before, he does not have any symptoms related to this and we can continue to watch and monitor this.  Plan: We'll continue to watch and monitor his counts every 6 months with CBC with differential from now on.  Orders Placed This Encounter  Procedures  . CBC with Differential    Standing Status: Future     Number of Occurrences:      Standing Expiration Date: 08/19/2016   The patient has a good understanding of the overall plan. he agrees with it. he will call with any problems that may develop before the next visit here.   Rulon Eisenmenger, MD 08/20/2015

## 2015-08-26 ENCOUNTER — Telehealth: Payer: Self-pay | Admitting: Internal Medicine

## 2015-08-26 NOTE — Telephone Encounter (Signed)
Patient was denied from [employer] Patient assistance due to prescription drug coverage. Patient received this notification as well but was approved for 11 refills of entresto for free of charge.   No further assistance necessary

## 2015-08-26 NOTE — Telephone Encounter (Signed)
F/u  Pt following up on assistance paperwork faxed in last week. Please call back and discuss.

## 2015-08-29 ENCOUNTER — Ambulatory Visit (INDEPENDENT_AMBULATORY_CARE_PROVIDER_SITE_OTHER): Payer: BLUE CROSS/BLUE SHIELD | Admitting: Internal Medicine

## 2015-08-29 ENCOUNTER — Other Ambulatory Visit (INDEPENDENT_AMBULATORY_CARE_PROVIDER_SITE_OTHER): Payer: BLUE CROSS/BLUE SHIELD

## 2015-08-29 ENCOUNTER — Encounter: Payer: Self-pay | Admitting: Internal Medicine

## 2015-08-29 VITALS — BP 120/64 | HR 82 | Temp 98.3°F | Resp 20 | Wt 232.0 lb

## 2015-08-29 DIAGNOSIS — Z0001 Encounter for general adult medical examination with abnormal findings: Secondary | ICD-10-CM | POA: Insufficient documentation

## 2015-08-29 DIAGNOSIS — E785 Hyperlipidemia, unspecified: Secondary | ICD-10-CM | POA: Diagnosis not present

## 2015-08-29 DIAGNOSIS — Z0000 Encounter for general adult medical examination without abnormal findings: Secondary | ICD-10-CM | POA: Diagnosis not present

## 2015-08-29 DIAGNOSIS — I10 Essential (primary) hypertension: Secondary | ICD-10-CM

## 2015-08-29 DIAGNOSIS — R739 Hyperglycemia, unspecified: Secondary | ICD-10-CM

## 2015-08-29 LAB — CBC WITH DIFFERENTIAL/PLATELET
BASOS PCT: 0.6 % (ref 0.0–3.0)
Basophils Absolute: 0 10*3/uL (ref 0.0–0.1)
EOS ABS: 0.1 10*3/uL (ref 0.0–0.7)
EOS PCT: 1.8 % (ref 0.0–5.0)
HCT: 53.3 % — ABNORMAL HIGH (ref 39.0–52.0)
Hemoglobin: 17.6 g/dL — ABNORMAL HIGH (ref 13.0–17.0)
Lymphocytes Relative: 42.9 % (ref 12.0–46.0)
Lymphs Abs: 3.1 10*3/uL (ref 0.7–4.0)
MCHC: 33 g/dL (ref 30.0–36.0)
MCV: 87 fl (ref 78.0–100.0)
MONO ABS: 0.5 10*3/uL (ref 0.1–1.0)
Monocytes Relative: 6.3 % (ref 3.0–12.0)
NEUTROS ABS: 3.5 10*3/uL (ref 1.4–7.7)
Neutrophils Relative %: 48.4 % (ref 43.0–77.0)
PLATELETS: 129 10*3/uL — AB (ref 150.0–400.0)
RBC: 6.13 Mil/uL — ABNORMAL HIGH (ref 4.22–5.81)
RDW: 14.4 % (ref 11.5–15.5)
WBC: 7.3 10*3/uL (ref 4.0–10.5)

## 2015-08-29 LAB — LIPID PANEL
CHOLESTEROL: 177 mg/dL (ref 0–200)
HDL: 36.6 mg/dL — ABNORMAL LOW (ref >39.00)
LDL CALC: 103 mg/dL — AB (ref 0–99)
NonHDL: 140.33
TRIGLYCERIDES: 185 mg/dL — AB (ref 0.0–149.0)
Total CHOL/HDL Ratio: 5
VLDL: 37 mg/dL (ref 0.0–40.0)

## 2015-08-29 LAB — URINALYSIS, ROUTINE W REFLEX MICROSCOPIC
BILIRUBIN URINE: NEGATIVE
HGB URINE DIPSTICK: NEGATIVE
KETONES UR: NEGATIVE
Leukocytes, UA: NEGATIVE
NITRITE: NEGATIVE
RBC / HPF: NONE SEEN (ref 0–?)
Specific Gravity, Urine: 1.01 (ref 1.000–1.030)
TOTAL PROTEIN, URINE-UPE24: NEGATIVE
Urine Glucose: NEGATIVE
Urobilinogen, UA: 0.2 (ref 0.0–1.0)
WBC UA: NONE SEEN (ref 0–?)
pH: 7.5 (ref 5.0–8.0)

## 2015-08-29 LAB — PSA: PSA: 0.39 ng/mL (ref 0.10–4.00)

## 2015-08-29 LAB — HEPATIC FUNCTION PANEL
ALK PHOS: 45 U/L (ref 39–117)
ALT: 22 U/L (ref 0–53)
AST: 24 U/L (ref 0–37)
Albumin: 4.7 g/dL (ref 3.5–5.2)
BILIRUBIN DIRECT: 0.1 mg/dL (ref 0.0–0.3)
BILIRUBIN TOTAL: 0.5 mg/dL (ref 0.2–1.2)
Total Protein: 7.4 g/dL (ref 6.0–8.3)

## 2015-08-29 LAB — BASIC METABOLIC PANEL
BUN: 9 mg/dL (ref 6–23)
CHLORIDE: 103 meq/L (ref 96–112)
CO2: 30 mEq/L (ref 19–32)
Calcium: 9.7 mg/dL (ref 8.4–10.5)
Creatinine, Ser: 1.14 mg/dL (ref 0.40–1.50)
GFR: 83.99 mL/min (ref >60.00)
GLUCOSE: 103 mg/dL — AB (ref 70–99)
POTASSIUM: 4.3 meq/L (ref 3.5–5.1)
Sodium: 140 mEq/L (ref 135–145)

## 2015-08-29 LAB — TSH: TSH: 1.89 u[IU]/mL (ref 0.35–4.50)

## 2015-08-29 LAB — HEMOGLOBIN A1C: Hgb A1c MFr Bld: 6.4 % (ref 4.6–6.5)

## 2015-08-29 MED ORDER — FUROSEMIDE 40 MG PO TABS: 40.0000 mg | ORAL_TABLET | Freq: Every day | ORAL | Status: DC

## 2015-08-29 MED ORDER — CARVEDILOL 25 MG PO TABS: 25.0000 mg | ORAL_TABLET | Freq: Two times a day (BID) | ORAL | Status: DC

## 2015-08-29 MED ORDER — TRAMADOL HCL 50 MG PO TABS: 50.0000 mg | ORAL_TABLET | Freq: Three times a day (TID) | ORAL | Status: DC | PRN

## 2015-08-29 NOTE — Progress Notes (Signed)
Pre visit review using our clinic review tool, if applicable. No additional management support is needed unless otherwise documented below in the visit note. 

## 2015-08-29 NOTE — Patient Instructions (Addendum)

## 2015-08-29 NOTE — Progress Notes (Signed)
Subjective:    Patient ID: Matthew Guerrero, male    DOB: Mar 25, 1956, 60 y.o.   MRN: [id number]  HPI  Here for wellness and f/u;  Overall doing ok;  Pt denies Chest pain, worsening SOB, DOE, wheezing, orthopnea, PND, worsening LE edema, palpitations, dizziness or syncope.  Pt denies neurological change such as new headache, facial or extremity weakness.  Pt denies polydipsia, polyuria, or low sugar symptoms. Pt states overall good compliance with treatment and medications, good tolerability, and has been trying to follow appropriate diet.  Pt denies worsening depressive symptoms, suicidal ideation or panic. No fever, night sweats, wt loss, loss of appetite, or other constitutional symptoms.  Pt states good ability with ADL's, has low fall risk, home safety reviewed and adequate, no other significant changes in hearing or vision, and only occasionally active with exercise.  Pt continues to have recurring LBP without change in severity, bowel or bladder change, fever, wt loss,  worsening LE pain/numbness/weakness, gait change or falls.  No other complaints Past Medical History  Diagnosis Date  . ALLERGIC RHINITIS   . GOUT   . HYPERLIPIDEMIA   . HYPERTENSION   . Asthma   . Cervical radiculitis   . Lateral epicondylitis of right elbow   . Hyperglycemia 08/28/2014  . S/P cardiac cath wjith normal coronary arteries  09/28/2014  . Nonischemic cardiomyopathy ([place]) 09/28/2014  . Chronic systolic heart failure ([place]) 02/28/2015  . CHF (congestive heart failure) ([place])   . Sleep apnea   . GERD (gastroesophageal reflux disease)   . Obesity (BMI 30-39.9) 06/06/2015   Past Surgical History  Procedure Laterality Date  . Colonoscopy    . Left and right heart catheterization with coronary angiogram N/A 09/27/2014    Procedure: LEFT AND RIGHT HEART CATHETERIZATION WITH CORONARY ANGIOGRAM;  Surgeon: Belva Crome, MD;  Location: [facility] CATH LAB;  Service: Cardiovascular;  Laterality: N/A;  . Subq icd  03/21/2015  . Ep  implantable device N/A 03/21/2015    Procedure: SubQ ICD Implant;  Surgeon: Deboraha Sprang, MD;  Location: [place] CV LAB;  Service: Cardiovascular;  Laterality: N/A;    reports that he has never smoked. He has never used smokeless tobacco. He reports that he does not drink alcohol or use illicit drugs. family history includes Aneurysm (age of onset: 28) in his sister; Diabetes in his father; Healthy in his brother, brother, and brother; Heart attack (age of onset: 75) in his father; Heart disease in his father; Hypertension in his sister; Lung cancer in his mother. There is no history of Colon cancer, Esophageal cancer, Rectal cancer, or Stomach cancer. No Known Allergies Current Outpatient Prescriptions on File Prior to Visit  Medication Sig Dispense Refill  . aspirin EC 81 MG EC tablet Take 1 tablet (81 mg total) by mouth daily.    . colchicine 0.6 MG tablet Take 1 tablet (0.6 mg total) by mouth daily. 90 tablet 1  . ENTRESTO 49-51 MG take 1 tablet by mouth twice a day 60 tablet 5  . Multiple Vitamin (MULTIVITAMIN WITH MINERALS) TABS tablet Take 1 tablet by mouth daily.    [name] spironolactone (ALDACTONE) 25 MG tablet Take 0.5 tablets (12.5 mg total) by mouth daily. 45 tablet 1   No current facility-administered medications on file prior to visit.    Review of Systems Constitutional: Negative for increased diaphoresis, other activity, appetite or siginficant weight change other than noted HENT: Negative for worsening hearing loss, ear pain, facial swelling, mouth sores and  neck stiffness.   Eyes: Negative for other worsening pain, redness or visual disturbance.  Respiratory: Negative for shortness of breath and wheezing  Cardiovascular: Negative for chest pain and palpitations.  Gastrointestinal: Negative for diarrhea, blood in stool, abdominal distention or other pain Genitourinary: Negative for hematuria, flank pain or change in urine volume.  Musculoskeletal: Negative for myalgias or  other joint complaints.  Skin: Negative for color change and wound or drainage.  Neurological: Negative for syncope and numbness. other than noted Hematological: Negative for adenopathy. or other swelling Psychiatric/Behavioral: Negative for hallucinations, SI, self-injury, decreased concentration or other worsening agitation.      Objective:   Physical Exam BP 120/64 mmHg  Pulse 82  Temp(Src) 98.3 F (36.8 C) (Oral)  Resp 20  Wt 232 lb (105.235 kg)  SpO2 95% VS noted,  Constitutional: Pt is oriented to person, place, and time. Appears well-developed and well-nourished, in no significant distress Head: Normocephalic and atraumatic.  Right Ear: External ear normal.  Left Ear: External ear normal.  Nose: Nose normal.  Mouth/Throat: Oropharynx is clear and moist.  Eyes: Conjunctivae and EOM are normal. Pupils are equal, round, and reactive to light.  Neck: Normal range of motion. Neck supple. No JVD present. No tracheal deviation present or significant neck LA or mass Cardiovascular: Normal rate, regular rhythm, normal heart sounds and intact distal pulses.   Pulmonary/Chest: Effort normal and breath sounds without rales or wheezing  Abdominal: Soft. Bowel sounds are normal. NT. No HSM  Musculoskeletal: Normal range of motion. Exhibits no edema.  Lymphadenopathy:  Has no cervical adenopathy.  Neurological: Pt is alert and oriented to person, place, and time. Pt has normal reflexes. No cranial nerve deficit. Motor grossly intact Skin: Skin is warm and dry. No rash noted.  Psychiatric:  Has normal mood and affect. Behavior is normal.     Assessment & Plan:

## 2015-08-30 LAB — HEPATITIS C ANTIBODY: HCV AB: NEGATIVE

## 2015-08-31 NOTE — Assessment & Plan Note (Signed)
stable overall by history and exam, recent data reviewed with pt, and pt to continue medical treatment as before,  to f/u any worsening symptoms or concerns Lab Results  Component Value Date   HGBA1C 6.4 08/29/2015

## 2015-08-31 NOTE — Assessment & Plan Note (Signed)
stable overall by history and exam, recent data reviewed with pt, and pt to continue medical treatment as before,  to f/u any worsening symptoms or concerns Lab Results  Component Value Date   LDLCALC 103* 08/29/2015

## 2015-08-31 NOTE — Assessment & Plan Note (Signed)

## 2015-08-31 NOTE — Assessment & Plan Note (Signed)
stable overall by history and exam, recent data reviewed with pt, and pt to continue medical treatment as before,  to f/u any worsening symptoms or concerns BP Readings from Last 3 Encounters:  08/29/15 120/64  08/20/15 116/75  06/25/15 116/80

## 2015-09-24 ENCOUNTER — Ambulatory Visit (INDEPENDENT_AMBULATORY_CARE_PROVIDER_SITE_OTHER): Payer: BLUE CROSS/BLUE SHIELD | Admitting: *Deleted

## 2015-09-24 DIAGNOSIS — I428 Other cardiomyopathies: Secondary | ICD-10-CM

## 2015-09-24 DIAGNOSIS — I429 Cardiomyopathy, unspecified: Secondary | ICD-10-CM | POA: Diagnosis not present

## 2015-09-24 NOTE — Progress Notes (Signed)
Remote ICD transmission.   

## 2015-09-30 ENCOUNTER — Telehealth: Payer: Self-pay | Admitting: Internal Medicine

## 2015-09-30 NOTE — Telephone Encounter (Addendum)
Called patient to make him aware that we still have not received his transmission.  Patient states that tech services told him that their "system is down" and that it will not transmit through until the system issue is resolved.  Patient is agreeable to me calling him later this week when transmission is received.

## 2015-09-30 NOTE — Telephone Encounter (Signed)
New message       1. Has your device fired? No  2. Is you device beeping? yes 3. Are you experiencing draining or swelling at device site? no 4. Are you calling to see if we received your device transmission? no 5. Have you passed out? no

## 2015-09-30 NOTE — Telephone Encounter (Signed)
Returned patient's call.  He states that his Latitude monitor is not transmitting.  The lights cycle through to the picture of the doctor, then turn yellow.  Patient unplugged everything and set it back up.  Advised patient to try transmitting again and he states he will when he gets back into the house.  Advised patient that I will be on the lookout for his transmission and will call him back around 10:30am.  Patient is agreeable to this plan and denies additional questions at this time.

## 2015-09-30 NOTE — Telephone Encounter (Signed)
Called patient back.  Advised that transmission still has not been received.  Patient states he called tech services and the rep told him it would take up to 2 hours to transmit.  Advised patient that I will look for it again this afternoon and let him know if it has been received.  He is agreeable to this plan.

## 2015-09-30 NOTE — Telephone Encounter (Signed)
Advised patient that transmission was received and that it shows no alerts.  Patient is appreciative of call and denies additional questions or concerns.

## 2015-10-14 ENCOUNTER — Telehealth: Payer: Self-pay

## 2015-10-14 NOTE — Telephone Encounter (Signed)
PA initiated via CoverMyMeds key [id number]

## 2015-10-15 NOTE — Telephone Encounter (Signed)
PA APPROVED through 06/21/2038

## 2015-10-25 ENCOUNTER — Encounter: Payer: Self-pay | Admitting: Internal Medicine

## 2015-10-25 ENCOUNTER — Ambulatory Visit (INDEPENDENT_AMBULATORY_CARE_PROVIDER_SITE_OTHER): Payer: BLUE CROSS/BLUE SHIELD | Admitting: Internal Medicine

## 2015-10-25 VITALS — BP 114/80 | HR 70 | Ht 70.0 in | Wt 230.0 lb

## 2015-10-25 DIAGNOSIS — I428 Other cardiomyopathies: Secondary | ICD-10-CM

## 2015-10-25 DIAGNOSIS — I10 Essential (primary) hypertension: Secondary | ICD-10-CM | POA: Diagnosis not present

## 2015-10-25 DIAGNOSIS — G4733 Obstructive sleep apnea (adult) (pediatric): Secondary | ICD-10-CM

## 2015-10-25 DIAGNOSIS — I429 Cardiomyopathy, unspecified: Secondary | ICD-10-CM

## 2015-10-25 DIAGNOSIS — E669 Obesity, unspecified: Secondary | ICD-10-CM | POA: Diagnosis not present

## 2015-10-25 DIAGNOSIS — I5022 Chronic systolic (congestive) heart failure: Secondary | ICD-10-CM

## 2015-10-25 NOTE — Patient Instructions (Signed)
Your physician has requested that you have an echocardiogram @ [address] - [address]rd [address] - in August 2017. Echocardiography is a painless test that uses sound waves to create images of your heart. It provides your doctor with information about the size and shape of your heart and how well your heart's chambers and valves are working. This procedure takes approximately one hour. There are no restrictions for this procedure.  Your physician wants you to follow-up in: 6 months with Dr. Debara Pickett. You will receive a reminder letter in the mail two months in advance. If you don't receive a letter, please call our office to schedule the follow-up appointment.

## 2015-10-25 NOTE — Progress Notes (Signed)
OFFICE NOTE  Chief Complaint:  Routine follow-up  Primary Care Physician: Cathlean Cower, MD  HPI:  Barrie Schwitzer is a 60 y.o. male with a hx of HTN, HL, gout, asthma. He was seen in the emergency room in late March with abdominal pain and vomiting. ECG was abnormal and a fast scan demonstrated no pericardial effusion but global hypokinesis. Plan was to follow-up as an outpatient. However, he presented back to the emergency room with progressive shortness of breath and chest pain with exertion. Admitted 4/5-4/8 with acute systolic HF. EF was noted to be 15-20% on echo. Troponin was minimally elevated (0.14). He was diuresed. LHC was arranged and demonstrated normal coronary arteries. He was dx with NICM. Medications were adjusted for CHF and he returns for FU. Of note, ACE inhibitor was stopped and he was placed on Entresto.   Since DC, he is doing well. His breathing is improved. He is NYHA 2b. He denies orthopnea, PND, edema. He denies chest pain, syncope. Does admit to snoring and does take daytime naps. He has a non-productive cough.    Studies/Reports Reviewed Today:  Echo 09/27/14 - Mild LVH. EF 15% to 20%. Diffuse hypokinesis. There is akinesis of the entireanteroseptal myocardium. Grade 1 diastolic dysfunction. There is muscular trabeculation at apex of left ventricle (no obvious thrombus identified). Definity contrast used. - Mitral valve: There was mild regurgitation. - Left atrium: The atrium was moderately dilated. - Right ventricle: The cavity size was moderately dilated. Wall thickness was normal. - Right atrium: The atrium was mildly dilated.  LHC 09/27/14 The left main coronary artery is normal. The left anterior descending artery is normal. The left circumflex artery is normal. The right coronary artery is dominant and normal. EF: 15%.  Mr. Davidoff has an upcoming sleep study which should be helpful hopefully and may be contributing to his  erythrocytosis. I suspect the etiology of his heart failure is hypertensive heart disease. There remains to be seen whether he'll have improvement in LV function. He seems to be tolerating his medications at this point. He does get somewhat fatigued with walking up stairs and doing activities. I suspect a benefit from cardiac rehabilitation.  I saw Mr. Scatena back in the office today. He is here for a limited echo follow-up which was performed on 02/08/2015. Unfortunately this shows the EF to to remain between 10 and 15%. You have his medication shows near optimal therapy as he is currently on aspirin, carvedilol, Lasix, and Entresto 49/51 mg. He seems to be tolerating the medications and has NYHA class II symptoms.  Mr. Camfield returns today for follow-up. He recently had placement of an AICD for primary prevention of sudden cardiac death by Dr. Caryl Comes in 16-Mar-2023. This went well and since then he his not had any significant problems. He denies any device firings. He had one episode which was difficult to understand that happened in church but required some reprogramming. Weight is stable at 255 which was his weight in 03-16-23. He continues to be compliant with CPAP and has an appointment to see Dr. Radford Pax for reevaluation in December. He is also seeing Dr. Caryl Comes back in January. He is currently applying for disability. It should be noted that he has class II symptoms and does get short of breath with moderate exertion and is unable to lift more than 10 pounds routinely. His previous job required more physical work than this.  10/25/2015  Mr. Chesmore returns today for follow-up. He's done exceedingly well. He  seems to be tolerating Entresto at the 49/50 one dose. His weight has been stable, in fact it is improved to 230 pounds. He denies any worsening shortness of breath. He currently has class 1-2 symptoms. He's had no AICD firings. He uses CPAP and is been compliant with that. He has a follow-up with  Dr. Radford Pax this summer. He also has an ICD clinic follow-up as well. He is requesting a handicap parking sticker today which I will provide given the significance of his cardiomyopathy.   PMHx:  Past Medical History  Diagnosis Date  . ALLERGIC RHINITIS   . GOUT   . HYPERLIPIDEMIA   . HYPERTENSION   . Asthma   . Cervical radiculitis   . Lateral epicondylitis of right elbow   . Hyperglycemia 08/28/2014  . S/P cardiac cath wjith normal coronary arteries  09/28/2014  . Nonischemic cardiomyopathy ([place]) 09/28/2014  . Chronic systolic heart failure ([place]) 02/28/2015  . CHF (congestive heart failure) ([place])   . Sleep apnea   . GERD (gastroesophageal reflux disease)   . Obesity (BMI 30-39.9) 06/06/2015    Past Surgical History  Procedure Laterality Date  . Colonoscopy    . Left and right heart catheterization with coronary angiogram N/A 09/27/2014    Procedure: LEFT AND RIGHT HEART CATHETERIZATION WITH CORONARY ANGIOGRAM;  Surgeon: Belva Crome, MD;  Location: [facility] CATH LAB;  Service: Cardiovascular;  Laterality: N/A;  . Subq icd  03/21/2015  . Ep implantable device N/A 03/21/2015    Procedure: SubQ ICD Implant;  Surgeon: Deboraha Sprang, MD;  Location: [place] CV LAB;  Service: Cardiovascular;  Laterality: N/A;    FAMHx:  Family History  Problem Relation Age of Onset  . Lung cancer Mother   . Diabetes Father   . Heart disease Father   . Heart attack Father 3  . Colon cancer Neg Hx   . Esophageal cancer Neg Hx   . Rectal cancer Neg Hx   . Stomach cancer Neg Hx   . Hypertension Sister   . Aneurysm Sister 78    brain aneurysm  . Healthy Brother   . Healthy Brother   . Healthy Brother     SOCHx:   reports that he has never smoked. He has never used smokeless tobacco. He reports that he does not drink alcohol or use illicit drugs.  ALLERGIES:  No Known Allergies  ROS: Pertinent items noted in HPI and remainder of comprehensive ROS otherwise negative.  HOME MEDS: Current  Outpatient Prescriptions  Medication Sig Dispense Refill  . aspirin EC 81 MG EC tablet Take 1 tablet (81 mg total) by mouth daily.    . carvedilol (COREG) 25 MG tablet Take 1 tablet (25 mg total) by mouth 2 (two) times daily with a meal. 180 tablet 3  . colchicine 0.6 MG tablet Take 1 tablet (0.6 mg total) by mouth daily. 90 tablet 1  . ENTRESTO 49-51 MG take 1 tablet by mouth twice a day 60 tablet 5  . furosemide (LASIX) 40 MG tablet Take 1 tablet (40 mg total) by mouth daily. 90 tablet 3  . Multiple Vitamin (MULTIVITAMIN WITH MINERALS) TABS tablet Take 1 tablet by mouth daily.    [name] spironolactone (ALDACTONE) 25 MG tablet Take 0.5 tablets (12.5 mg total) by mouth daily. 45 tablet 1  . traMADol (ULTRAM) 50 MG tablet Take 1 tablet (50 mg total) by mouth every 8 (eight) hours as needed (pain). 30 tablet 5   No current facility-administered medications  for this visit.    LABS/IMAGING: No results found for this or any previous visit (from the past 48 hour(s)). No results found.  WEIGHTS: Wt Readings from Last 3 Encounters:  10/25/15 230 lb (104.327 kg)  08/29/15 232 lb (105.235 kg)  08/20/15 231 lb (104.781 kg)    VITALS: BP 114/80 mmHg  Pulse 70  Ht 5\' 10"  (1.778 m)  Wt 230 lb (104.327 kg)  BMI 33.00 kg/m2  EXAM: General appearance: alert and no distress Neck: no carotid bruit, no JVD and thyroid not enlarged, symmetric, no tenderness/mass/nodules Lungs: clear to auscultation bilaterally Heart: regular rate and rhythm, S1, S2 normal, no murmur, click, rub or gallop and AICD pocket is intact Abdomen: soft, non-tender; bowel sounds normal; no masses,  no organomegaly Extremities: extremities normal, atraumatic, no cyanosis or edema Pulses: 2+ and symmetric Skin: Skin color, texture, turgor normal. No rashes or lesions Neurologic: Grossly normal Psych: Pleasant  EKG: Sinus rhythm at 70, LVH with ST and T-wave changes   ASSESSMENT: 1. Nonischemic, probably hypertensive  cardiomyopathy, EF 10-15% with normal coronaries-NYHA class I-II symptoms 2. Hypertension-controlled 3. Dyslipidemia 4. OSA on CPAP 5. Gout 6. Status post AICD for primary prevention of sudden cardiac death  PLAN: 1.   Mr. Mccrimon is doing well clinically. His weight is decreasing and he denies any worsening shortness of breath with exertion. He reports moving fairly slowly but is doing well. He's had no events regarding his defibrillator. Blood pressure is well-controlled. He is tolerating Entresto at his current dose. I like to reassess his LVEF in August which would be one year since his last echocardiogram. He is compliant with his CPAP. He has follow-ups with both Dr. Radford Pax for sleep apnea as well as the device clinic this summer.  Follow-up with me in 6 months.   Pixie Casino, MD, [facility] Attending Cardiologist [facility] Emillio Ngo 10/25/2015, 8:12 AM

## 2015-11-11 LAB — CUP PACEART REMOTE DEVICE CHECK
Date Time Interrogation Session: 20170404110200
Implantable Lead Location: 753858
Implantable Lead Model: 3401
MDC IDC LEAD IMPLANT DT: 20160929
MDC IDC MSMT BATTERY REMAINING PERCENTAGE: 94 %
Pulse Gen Serial Number: 118449

## 2015-11-12 ENCOUNTER — Encounter: Payer: Self-pay | Admitting: Cardiology

## 2015-11-25 ENCOUNTER — Encounter: Payer: Self-pay | Admitting: Internal Medicine

## 2015-11-27 ENCOUNTER — Encounter: Payer: Self-pay | Admitting: Cardiology

## 2015-12-02 ENCOUNTER — Encounter: Payer: Self-pay | Admitting: Cardiology

## 2015-12-02 ENCOUNTER — Ambulatory Visit (INDEPENDENT_AMBULATORY_CARE_PROVIDER_SITE_OTHER): Payer: BLUE CROSS/BLUE SHIELD | Admitting: Cardiology

## 2015-12-02 VITALS — BP 110/70 | HR 77 | Ht 70.0 in | Wt 223.0 lb

## 2015-12-02 DIAGNOSIS — I10 Essential (primary) hypertension: Secondary | ICD-10-CM | POA: Diagnosis not present

## 2015-12-02 DIAGNOSIS — E669 Obesity, unspecified: Secondary | ICD-10-CM | POA: Diagnosis not present

## 2015-12-02 DIAGNOSIS — G4733 Obstructive sleep apnea (adult) (pediatric): Secondary | ICD-10-CM

## 2015-12-02 NOTE — Progress Notes (Signed)
Cardiology Office Note    Date:  12/02/2015   ID:  Matthew Guerrero, DOB [birth year], MRN [id number]  PCP:  Cathlean Cower, MD  Cardiologist:  Fransico Him, MD   Chief Complaint  Patient presents with  . Sleep Apnea  . Hypertension    History of Present Illness:  Matthew Guerrero is a 60 y.o. male who presents for followup of OSA. He underwent a PSG due to morning headaches and snoring which showed moderate OSA with an AHI of 23/hr and mlid to moderate snoring. His oxygen saturations dropped to 71%. He is on 9 cm H2O and is doing well with his device. He tolerates his Airfit Pillow 46mask and feels the pressure is adequate. He has some dryness of his mouth but does not want to use a chin strap.  He denies any daytime sleepiness and feels rested in the am. He does not snore.     Past Medical History  Diagnosis Date  . ALLERGIC RHINITIS   . GOUT   . HYPERLIPIDEMIA   . HYPERTENSION   . Asthma   . Cervical radiculitis   . Lateral epicondylitis of right elbow   . Hyperglycemia 08/28/2014  . S/P cardiac cath wjith normal coronary arteries  09/28/2014  . Nonischemic cardiomyopathy ([place]) 09/28/2014  . Chronic systolic heart failure ([place]) 02/28/2015  . CHF (congestive heart failure) ([place])   . Sleep apnea   . GERD (gastroesophageal reflux disease)   . Obesity (BMI 30-39.9) 06/06/2015    Past Surgical History  Procedure Laterality Date  . Colonoscopy    . Left and right heart catheterization with coronary angiogram N/A 09/27/2014    Procedure: LEFT AND RIGHT HEART CATHETERIZATION WITH CORONARY ANGIOGRAM;  Surgeon: Belva Crome, MD;  Location[facility] CATH LAB;  Service: Cardiovascular;  Laterality: N/A;  . Subq icd  03/21/2015  . Ep implantable device N/A 03/21/2015    Procedure: SubQ ICD Implant;  Surgeon: Deboraha Sprang, MD;  Location: [place] CV LAB;  Service: Cardiovascular;  Laterality: N/A;    Current Medications: Outpatient Prescriptions Prior to Visit  Medication Sig Dispense  Refill  . aspirin EC 81 MG EC tablet Take 1 tablet (81 mg total) by mouth daily.    . carvedilol (COREG) 25 MG tablet Take 1 tablet (25 mg total) by mouth 2 (two) times daily with a meal. 180 tablet 3  . colchicine 0.6 MG tablet Take 1 tablet (0.6 mg total) by mouth daily. 90 tablet 1  . ENTRESTO 49-51 MG take 1 tablet by mouth twice a day 60 tablet 5  . furosemide (LASIX) 40 MG tablet Take 1 tablet (40 mg total) by mouth daily. 90 tablet 3  . Multiple Vitamin (MULTIVITAMIN WITH MINERALS) TABS tablet Take 1 tablet by mouth daily.    [name] spironolactone (ALDACTONE) 25 MG tablet Take 0.5 tablets (12.5 mg total) by mouth daily. 45 tablet 1  . traMADol (ULTRAM) 50 MG tablet Take 1 tablet (50 mg total) by mouth every 8 (eight) hours as needed (pain). 30 tablet 5   No facility-administered medications prior to visit.     Allergies:   Review of patient's allergies indicates no known allergies.   Social History   Social History  . Marital Status: Single    Spouse Name: N/A  . Number of Children: N/A  . Years of Education: N/A   Social History Main Topics  . Smoking status: Never Smoker   . Smokeless tobacco: Never Used  . Alcohol Use: No  .  Drug Use: No  . Sexual Activity: Not Asked   Other Topics Concern  . None   Social History Narrative     Family History:  The patient's family history includes Aneurysm (age of onset: 62) in his sister; Diabetes in his father; Healthy in his brother, brother, and brother; Heart attack (age of onset: 13) in his father; Heart disease in his father; Hypertension in his sister; Lung cancer in his mother. There is no history of Colon cancer, Esophageal cancer, Rectal cancer, or Stomach cancer.   ROS:   Please see the history of present illness.    Review of Systems  Respiratory: Positive for cough.   Neurological: Positive for dizziness.   All other systems reviewed and are negative.   PHYSICAL EXAM:   VS:  BP 110/70 mmHg  Pulse 77  Ht 5\' 10"   (1.778 m)  Wt 223 lb (101.152 kg)  BMI 32.00 kg/m2   GEN: Well nourished, well developed, in no acute distress HEENT: normal Neck: no JVD, carotid bruits, or masses Cardiac: RRR; no murmurs, rubs, or gallops,no edema.  Intact distal pulses bilaterally.  Respiratory:  clear to auscultation bilaterally, normal work of breathing GI: soft, nontender, nondistended, + BS MS: no deformity or atrophy Skin: warm and dry, no rash Neuro:  Alert and Oriented x 3, Strength and sensation are intact Psych: euthymic mood, full affect  Wt Readings from Last 3 Encounters:  12/02/15 223 lb (101.152 kg)  10/25/15 230 lb (104.327 kg)  08/29/15 232 lb (105.235 kg)      Studies/Labs Reviewed:   EKG:  EKG is not ordered today.   Recent Labs: 02/11/2015: Brain Natriuretic Peptide 276.6* 08/29/2015: ALT 22; BUN 9; Creatinine, Ser 1.14; Hemoglobin 17.6*; Platelets 129.0*; Potassium 4.3; Sodium 140; TSH 1.89   Lipid Panel    Component Value Date/Time   CHOL 177 08/29/2015 0942   TRIG 185.0* 08/29/2015 0942   HDL 36.60* 08/29/2015 0942   CHOLHDL 5 08/29/2015 0942   VLDL 37.0 08/29/2015 0942   LDLCALC 103* 08/29/2015 0942   LDLDIRECT 105.8 06/18/2010 0806    Additional studies/ records that were reviewed today include:  CPAP download    ASSESSMENT:    1. OSA (obstructive sleep apnea)   2. Essential hypertension   3. Obesity (BMI 30-39.9)      PLAN:  In order of problems listed above:  OSA - the patient is tolerating PAP therapy well without any problems. The PAP download was reviewed today and showed an AHI of 0.6/hr on 14 cm H2O with 100% compliance in using more than 4 hours nightly.  The patient has been using and benefiting from CPAP use and will continue to benefit from therapy.  HTN - BP controlled on current medical regimen.  Continue BB/Entresto/aldactone. 3.   Obesity - I have encouraged him to get into a routine exercise program and cut back on carbs and portions.   I encouraged  him to try to get under 200lbs.     Medication Adjustments/Labs and Tests Ordered: Current medicines are reviewed at length with the patient today.  Concerns regarding medicines are outlined above.  Medication changes, Labs and Tests ordered today are listed in the Patient Instructions below.  There are no Patient Instructions on file for this visit.   Signed, Fransico Him, MD  12/02/2015 1:19 PM    [facility] [address], [place], [place]  [address] Phone: [phone]; Fax: [phone]

## 2015-12-02 NOTE — Patient Instructions (Signed)

## 2015-12-03 ENCOUNTER — Ambulatory Visit: Payer: BLUE CROSS/BLUE SHIELD | Admitting: Cardiology

## 2015-12-06 ENCOUNTER — Telehealth: Payer: Self-pay

## 2015-12-06 MED ORDER — COLCHICINE 0.6 MG PO TABS: 0.6000 mg | ORAL_TABLET | Freq: Every day | ORAL | Status: DC

## 2015-12-06 NOTE — Telephone Encounter (Signed)
Inform refill sent to [facility]...Matthew Guerrero

## 2015-12-06 NOTE — Telephone Encounter (Signed)
colchicine 0.6 MG tablet [[id number]]       colchicine 0.6 MG tablet [id number]      Patient is requesting a refill on this medication.

## 2015-12-25 ENCOUNTER — Ambulatory Visit (INDEPENDENT_AMBULATORY_CARE_PROVIDER_SITE_OTHER): Payer: BLUE CROSS/BLUE SHIELD | Admitting: *Deleted

## 2015-12-25 DIAGNOSIS — I428 Other cardiomyopathies: Secondary | ICD-10-CM

## 2015-12-25 DIAGNOSIS — I429 Cardiomyopathy, unspecified: Secondary | ICD-10-CM

## 2015-12-25 LAB — CUP PACEART REMOTE DEVICE CHECK
Date Time Interrogation Session: 20170705110500
Implantable Lead Implant Date: 20160929
Implantable Lead Location: 753858
Implantable Lead Model: 3401
MDC IDC MSMT BATTERY REMAINING PERCENTAGE: 91 %
Pulse Gen Serial Number: 118449

## 2015-12-25 NOTE — Progress Notes (Signed)
Remote ICD transmission.   

## 2016-01-01 ENCOUNTER — Encounter: Payer: Self-pay | Admitting: Cardiology

## 2016-01-06 ENCOUNTER — Telehealth: Payer: Self-pay | Admitting: Internal Medicine

## 2016-01-06 NOTE — Telephone Encounter (Signed)
NEw Message  Pt calling requesting to speak with RN about sending transmission for September. Please call back to discuss

## 2016-01-06 NOTE — Telephone Encounter (Signed)
Returned patient's call.  He states that he is going on vacation in September and wants to know what to do with his transmitter.  Advised patient that he can leave his transmitter at home as he will be gone less than a week and that he should resume weekly transmissions the following Monday.  Patient is aware of appointment on 03/23/16 with Dr. Caryl Comes.  Patient is appreciative of assistance and denies additional questions or concerns at this time.

## 2016-01-27 ENCOUNTER — Other Ambulatory Visit (HOSPITAL_COMMUNITY): Payer: Self-pay | Admitting: Cardiology

## 2016-01-28 ENCOUNTER — Other Ambulatory Visit: Payer: Self-pay

## 2016-01-28 ENCOUNTER — Ambulatory Visit (HOSPITAL_COMMUNITY): Payer: BLUE CROSS/BLUE SHIELD | Attending: Cardiology

## 2016-01-28 ENCOUNTER — Telehealth: Payer: Self-pay | Admitting: Internal Medicine

## 2016-01-28 DIAGNOSIS — E785 Hyperlipidemia, unspecified: Secondary | ICD-10-CM | POA: Diagnosis not present

## 2016-01-28 DIAGNOSIS — I110 Hypertensive heart disease with heart failure: Secondary | ICD-10-CM | POA: Insufficient documentation

## 2016-01-28 DIAGNOSIS — Z6832 Body mass index (BMI) 32.0-32.9, adult: Secondary | ICD-10-CM | POA: Diagnosis not present

## 2016-01-28 DIAGNOSIS — E669 Obesity, unspecified: Secondary | ICD-10-CM | POA: Insufficient documentation

## 2016-01-28 DIAGNOSIS — I351 Nonrheumatic aortic (valve) insufficiency: Secondary | ICD-10-CM | POA: Insufficient documentation

## 2016-01-28 DIAGNOSIS — I429 Cardiomyopathy, unspecified: Secondary | ICD-10-CM

## 2016-01-28 DIAGNOSIS — I428 Other cardiomyopathies: Secondary | ICD-10-CM | POA: Diagnosis not present

## 2016-01-28 DIAGNOSIS — I340 Nonrheumatic mitral (valve) insufficiency: Secondary | ICD-10-CM | POA: Insufficient documentation

## 2016-01-28 DIAGNOSIS — I5022 Chronic systolic (congestive) heart failure: Secondary | ICD-10-CM | POA: Insufficient documentation

## 2016-01-28 MED ORDER — ASPIRIN 81 MG PO TBEC: 81.0000 mg | DELAYED_RELEASE_TABLET | Freq: Every day | ORAL | 3 refills | Status: DC

## 2016-01-28 NOTE — Telephone Encounter (Signed)
Spoke w/ patient and clarified - he is already taking daily ASA, there was a refill authorization request I responded on. He knows to take ASA and just wanted to make sure he did not need any new meds as result of echo findings. Had echo done today - we reviewed as Dr. Debara Pickett had already submitted result note. Pt aware of these findings and voiced understanding. He is aware to call if he has new questions or concerns.

## 2016-01-28 NOTE — Telephone Encounter (Signed)
New message   Pt c/o medication issue:  1. Name of Medication: Asprin   2. How are you currently taking this medication (dosage and times per day)? unknown 3. Are you having a reaction (difficulty breathing--STAT)? no 4. What is your medication issue? Pt stated he was unaware of the medication and wants to be told why he has to take it

## 2016-02-14 ENCOUNTER — Telehealth: Payer: Self-pay

## 2016-02-14 NOTE — Telephone Encounter (Signed)
Called patient to advise medication is placed in front office ready for pick up

## 2016-02-17 ENCOUNTER — Telehealth: Payer: Self-pay | Admitting: Hematology and Oncology

## 2016-02-17 ENCOUNTER — Other Ambulatory Visit (HOSPITAL_BASED_OUTPATIENT_CLINIC_OR_DEPARTMENT_OTHER): Payer: BLUE CROSS/BLUE SHIELD

## 2016-02-17 ENCOUNTER — Encounter: Payer: Self-pay | Admitting: Hematology and Oncology

## 2016-02-17 ENCOUNTER — Ambulatory Visit (HOSPITAL_BASED_OUTPATIENT_CLINIC_OR_DEPARTMENT_OTHER): Payer: BLUE CROSS/BLUE SHIELD | Admitting: Hematology and Oncology

## 2016-02-17 DIAGNOSIS — D751 Secondary polycythemia: Secondary | ICD-10-CM | POA: Diagnosis not present

## 2016-02-17 DIAGNOSIS — D696 Thrombocytopenia, unspecified: Secondary | ICD-10-CM | POA: Diagnosis not present

## 2016-02-17 LAB — CBC WITH DIFFERENTIAL/PLATELET
BASO%: 0.6 % (ref 0.0–2.0)
Basophils Absolute: 0 10*3/uL (ref 0.0–0.1)
EOS%: 2.6 % (ref 0.0–7.0)
Eosinophils Absolute: 0.2 10*3/uL (ref 0.0–0.5)
HCT: 55.1 % — ABNORMAL HIGH (ref 38.4–49.9)
HGB: 17.6 g/dL — ABNORMAL HIGH (ref 13.0–17.1)
LYMPH%: 40.8 % (ref 14.0–49.0)
MCH: 27.7 pg (ref 27.2–33.4)
MCHC: 31.9 g/dL — AB (ref 32.0–36.0)
MCV: 86.8 fL (ref 79.3–98.0)
MONO#: 0.6 10*3/uL (ref 0.1–0.9)
MONO%: 7.1 % (ref 0.0–14.0)
NEUT%: 48.9 % (ref 39.0–75.0)
NEUTROS ABS: 3.9 10*3/uL (ref 1.5–6.5)
Platelets: 130 10*3/uL — ABNORMAL LOW (ref 140–400)
RBC: 6.34 10*6/uL — AB (ref 4.20–5.82)
RDW: 14.5 % (ref 11.0–14.6)
WBC: 7.9 10*3/uL (ref 4.0–10.3)
lymph#: 3.2 10*3/uL (ref 0.9–3.3)

## 2016-02-17 NOTE — Progress Notes (Signed)
Patient Care Team: Biagio Borg, MD as PCP - General  DIAGNOSIS: Secondary polycythemia  CHIEF COMPLIANT: Occasional shortness of breath exertion  INTERVAL HISTORY: Matthew Guerrero is a 60 year old with above-mentioned history of secondary polycythemia most likely due to his history of this or heart failure who is here for six-month follow-up. He reports no new changes or symptoms. He feels relatively well. He denies any headache. He does have some dizziness when he stands up from sitting position. Also complains of shortness of breath to exertion.  REVIEW OF SYSTEMS:   Constitutional: Denies fevers, chills or abnormal weight loss Eyes: Denies blurriness of vision Ears, nose, mouth, throat, and face: Denies mucositis or sore throat Respiratory: Shortness of breath exertion Cardiovascular: Denies palpitation, chest discomfort Gastrointestinal:  Denies nausea, heartburn or change in bowel habits Skin: Denies abnormal skin rashes Lymphatics: Denies new lymphadenopathy or easy bruising Neurological:Denies numbness, tingling or new weaknesses Behavioral/Psych: Mood is stable, no new changes  Extremities: No lower extremity edema  All other systems were reviewed with the patient and are negative.  I have reviewed the past medical history, past surgical history, social history and family history with the patient and they are unchanged from previous note.  ALLERGIES:  has No Known Allergies.  MEDICATIONS:  Current Outpatient Prescriptions  Medication Sig Dispense Refill  . aspirin 81 MG EC tablet Take 1 tablet (81 mg total) by mouth daily. 30 tablet 3  . carvedilol (COREG) 25 MG tablet Take 1 tablet (25 mg total) by mouth 2 (two) times daily with a meal. 180 tablet 3  . colchicine 0.6 MG tablet Take 1 tablet (0.6 mg total) by mouth daily. 90 tablet 1  . ENTRESTO 49-51 MG take 1 tablet by mouth twice a day 60 tablet 5  . furosemide (LASIX) 40 MG tablet Take 1 tablet (40 mg total) by  mouth daily. 90 tablet 3  . Multiple Vitamin (MULTIVITAMIN WITH MINERALS) TABS tablet Take 1 tablet by mouth daily.    [name] spironolactone (ALDACTONE) 25 MG tablet Take 0.5 tablets (12.5 mg total) by mouth daily. 45 tablet 1  . traMADol (ULTRAM) 50 MG tablet Take 1 tablet (50 mg total) by mouth every 8 (eight) hours as needed (pain). 30 tablet 5   No current facility-administered medications for this visit.     PHYSICAL EXAMINATION: ECOG PERFORMANCE STATUS: 0 - Asymptomatic  Vitals:   02/17/16 0831  BP: 103/68  Pulse: 63  Resp: 18  Temp: 98.7 F (37.1 C)   Filed Weights   02/17/16 0831  Weight: 221 lb 9.6 oz (100.5 kg)    GENERAL:alert, no distress and comfortable SKIN: skin color, texture, turgor are normal, no rashes or significant lesions EYES: normal, Conjunctiva are pink and non-injected, sclera clear OROPHARYNX:no exudate, no erythema and lips, buccal mucosa, and tongue normal  NECK: supple, thyroid normal size, non-tender, without nodularity LYMPH:  no palpable lymphadenopathy in the cervical, axillary or inguinal LUNGS: clear to auscultation and percussion with normal breathing effort HEART: regular rate & rhythm and no murmurs and no lower extremity edema ABDOMEN:abdomen soft, non-tender and normal bowel sounds MUSCULOSKELETAL:no cyanosis of digits and no clubbing  NEURO: alert & oriented x 3 with fluent speech, no focal motor/sensory deficits EXTREMITIES: No lower extremity edema  LABORATORY DATA:  I have reviewed the data as listed   Chemistry      Component Value Date/Time   NA 140 08/29/2015 0942   K 4.3 08/29/2015 0942   CL 103 08/29/2015 0942  CO2 30 08/29/2015 0942   BUN 9 08/29/2015 0942   CREATININE 1.14 08/29/2015 0942   CREATININE 1.24 02/11/2015 0812      Component Value Date/Time   CALCIUM 9.7 08/29/2015 0942   ALKPHOS 45 08/29/2015 0942   AST 24 08/29/2015 0942   ALT 22 08/29/2015 0942   BILITOT 0.5 08/29/2015 0942       Lab Results    Component Value Date   WBC 7.9 02/17/2016   HGB 17.6 (H) 02/17/2016   HCT 55.1 (H) 02/17/2016   MCV 86.8 02/17/2016   PLT 130 (L) 02/17/2016   NEUTROABS 3.9 02/17/2016     ASSESSMENT & PLAN:  Polycythemia, secondary  Secondary Polycythemia: Negative JAK2 V617F mutation. Erythropoeitin 18.6 (mildly elevated) 10/26/14 Recommendation: 1. Most likely related to underlying cardiac disease 2. Phlebotomy is not the standard of care for secondary polycythemia. However if his Hb crosses 20, Or if he develops symptoms of congestion, we can consider doing it.  Today's hemoglobin is 17.6 and platelet count is 130 I provided a copy of these labs to the patient. I'm very happy with the stability of his hemoglobin. We will continue to watch and monitor him.  Thrombocytopenia: Suspicious for low grade ITP (Large platelets on smear suggest increased destruction). Today is 130. I discussed with them that even though the platelet count is slightly lower than before, he does not have any symptoms related to this and we can continue to watch and monitor this.  Plan: We'll continue to watch and monitor his counts every 6 months with CBC with differential from now on.   No orders of the defined types were placed in this encounter.  The patient has a good understanding of the overall plan. he agrees with it. he will call with any problems that may develop before the next visit here.   Rulon Eisenmenger, MD 02/17/16

## 2016-02-17 NOTE — Assessment & Plan Note (Signed)
  Secondary Polycythemia: Negative JAK2 V617F mutation. Erythropoeitin 18.6 (mildly elevated) 10/26/14 Recommendation: 1. Most likely related to underlying cardiac disease 2. Phlebotomy is not the standard of care for secondary polycythemia. However if his Hb crosses 20, Or if he develops symptoms of congestion, we can consider doing it.  Today's hemoglobin is 17.2 and platelet count is 111 I provided a copy of these labs to the patient. I'm very happy with the stability of his hemoglobin. We will continue to watch and monitor him.  Thrombocytopenia: Suspicious for low grade ITP (Large platelets on smear suggest increased destruction). Today is 111. I discussed with them that even though the platelet count is slightly lower than before, he does not have any symptoms related to this and we can continue to watch and monitor this.  Plan: We'll continue to watch and monitor his counts every 6 months with CBC with differential from now on.

## 2016-02-17 NOTE — Telephone Encounter (Signed)
appt made and avs printed °

## 2016-02-28 ENCOUNTER — Other Ambulatory Visit: Payer: Self-pay | Admitting: Internal Medicine

## 2016-02-28 ENCOUNTER — Encounter: Payer: Self-pay | Admitting: Internal Medicine

## 2016-03-01 ENCOUNTER — Other Ambulatory Visit: Payer: Self-pay | Admitting: Internal Medicine

## 2016-03-01 ENCOUNTER — Encounter: Payer: Self-pay | Admitting: Internal Medicine

## 2016-03-03 ENCOUNTER — Encounter: Payer: Self-pay | Admitting: Internal Medicine

## 2016-03-03 ENCOUNTER — Ambulatory Visit (INDEPENDENT_AMBULATORY_CARE_PROVIDER_SITE_OTHER): Payer: BLUE CROSS/BLUE SHIELD | Admitting: Internal Medicine

## 2016-03-03 VITALS — BP 116/68 | HR 76 | Temp 98.6°F | Resp 20 | Wt 222.4 lb

## 2016-03-03 DIAGNOSIS — R739 Hyperglycemia, unspecified: Secondary | ICD-10-CM

## 2016-03-03 DIAGNOSIS — M1712 Unilateral primary osteoarthritis, left knee: Secondary | ICD-10-CM | POA: Diagnosis not present

## 2016-03-03 DIAGNOSIS — J309 Allergic rhinitis, unspecified: Secondary | ICD-10-CM

## 2016-03-03 DIAGNOSIS — R6889 Other general symptoms and signs: Secondary | ICD-10-CM

## 2016-03-03 DIAGNOSIS — Z0001 Encounter for general adult medical examination with abnormal findings: Secondary | ICD-10-CM

## 2016-03-03 DIAGNOSIS — K219 Gastro-esophageal reflux disease without esophagitis: Secondary | ICD-10-CM | POA: Diagnosis not present

## 2016-03-03 MED ORDER — TRAMADOL HCL 50 MG PO TABS: 50.0000 mg | ORAL_TABLET | Freq: Two times a day (BID) | ORAL | 5 refills | Status: DC | PRN

## 2016-03-03 MED ORDER — PANTOPRAZOLE SODIUM 40 MG PO TBEC: 40.0000 mg | DELAYED_RELEASE_TABLET | Freq: Every day | ORAL | 3 refills | Status: DC

## 2016-03-03 MED ORDER — CETIRIZINE HCL 10 MG PO TABS: 10.0000 mg | ORAL_TABLET | Freq: Every day | ORAL | 11 refills | Status: DC

## 2016-03-03 NOTE — Progress Notes (Signed)
Subjective:    Patient ID: Matthew Guerrero, male    DOB: 07-18-1955, 60 y.o.   MRN: [id number]  HPI  Here to f/u; overall doing ok,  Pt denies chest pain, increasing sob or doe, wheezing, orthopnea, PND, increased LE swelling, palpitations, dizziness or syncope.  Pt denies new neurological symptoms such as new headache, or facial or extremity weakness or numbness.  Pt denies polydipsia, polyuria, or low sugar episode.   Pt denies new neurological symptoms such as new headache, or facial or extremity weakness or numbness.   Pt states overall good compliance with meds, mostly trying to follow appropriate diet, with wt overall stable,  but little exercise however. Wt Readings from Last 3 Encounters:  03/03/16 222 lb 6 oz (100.9 kg)  02/17/16 221 lb 9.6 oz (100.5 kg)  12/02/15 223 lb (101.2 kg)  To see Dr Jens Som next mo, s/p AICD.  No shocks.  Pt continues to have recurring LBP without change in severity, bowel or bladder change, fever, wt loss,  worsening LE pain/numbness/weakness, gait change or falls, but overall improved. Does have worsening left knee pain recetnly after a torque injury with wrestling.  No swelling, no falls, better to sit, worse to walk long distances.  Already had flu shot this season.   Has had mild worsening reflux,but no abd pain, dysphagia, n/v, bowel change or blood, despite better and anti-reflux precuations. Does have several wks ongoing nasal allergy symptoms with clearish congestion, itch and sneezing, without fever, pain, ST, cough, swelling or wheezing. Past Medical History:  Diagnosis Date  . ALLERGIC RHINITIS   . Asthma   . Cervical radiculitis   . CHF (congestive heart failure) ([place])   . Chronic systolic heart failure ([place]) 02/28/2015  . GERD (gastroesophageal reflux disease)   . GOUT   . Hyperglycemia 08/28/2014  . HYPERLIPIDEMIA   . HYPERTENSION   . Lateral epicondylitis of right elbow   . Nonischemic cardiomyopathy ([place]) 09/28/2014  . Obesity (BMI 30-39.9)  06/06/2015  . S/P cardiac cath wjith normal coronary arteries  09/28/2014  . Sleep apnea    Past Surgical History:  Procedure Laterality Date  . COLONOSCOPY    . EP IMPLANTABLE DEVICE N/A 03/21/2015   Procedure: SubQ ICD Implant;  Surgeon: Deboraha Sprang, MD;  Location: [place] [facility];  Service: Cardiovascular;  Laterality: N/A;  . LEFT AND RIGHT HEART CATHETERIZATION WITH CORONARY ANGIOGRAM N/A 09/27/2014   Procedure: LEFT AND RIGHT HEART CATHETERIZATION WITH CORONARY ANGIOGRAM;  Surgeon: Belva Crome, MD;  Location: [facility];  Service: Cardiovascular;  Laterality: N/A;  . SUBQ ICD  03/21/2015    reports that he has never smoked. He has never used smokeless tobacco. He reports that he does not drink alcohol or use drugs. family history includes Aneurysm (age of onset: 72) in his sister; Diabetes in his father; Healthy in his brother, brother, and brother; Heart attack (age of onset: 33) in his father; Heart disease in his father; Hypertension in his sister; Lung cancer in his mother. No Known Allergies Current Outpatient Prescriptions on File Prior to Visit  Medication Sig Dispense Refill  . aspirin 81 MG EC tablet Take 1 tablet (81 mg total) by mouth daily. 30 tablet 3  . carvedilol (COREG) 25 MG tablet Take 1 tablet (25 mg total) by mouth 2 (two) times daily with a meal. 180 tablet 3  . colchicine 0.6 MG tablet Take 1 tablet (0.6 mg total) by mouth daily. 90 tablet 1  .  ENTRESTO 49-51 MG take 1 tablet by mouth twice a day 60 tablet 5  . furosemide (LASIX) 40 MG tablet Take 1 tablet (40 mg total) by mouth daily. 90 tablet 3  . Multiple Vitamin (MULTIVITAMIN WITH MINERALS) TABS tablet Take 1 tablet by mouth daily.    [name] spironolactone (ALDACTONE) 25 MG tablet Take 0.5 tablets (12.5 mg total) by mouth daily. 45 tablet 1   No current facility-administered medications on file prior to visit.    Review of Systems  Constitutional: Negative for unusual diaphoresis or night sweats HENT:  Negative for ear swelling or discharge Eyes: Negative for worsening visual haziness  Respiratory: Negative for choking and stridor.   Gastrointestinal: Negative for distension or worsening eructation Genitourinary: Negative for retention or change in urine volume.  Musculoskeletal: Negative for other MSK pain or swelling Skin: Negative for color change and worsening wound Neurological: Negative for tremors and numbness other than noted  Psychiatric/Behavioral: Negative for decreased concentration or agitation other than above       Objective:   Physical Exam BP 116/68   Pulse 76   Temp 98.6 F (37 C) (Oral)   Resp 20   Wt 222 lb 6 oz (100.9 kg)   SpO2 94%   BMI 31.91 kg/m  VS noted,  Constitutional: Pt appears in no apparent distress HENT: Head: NCAT.  Right Ear: External ear normal.  Left Ear: External ear normal.  Eyes: . Pupils are equal, round, and reactive to light. Conjunctivae and EOM are normal Neck: Normal range of motion. Neck supple.  Cardiovascular: Normal rate and regular rhythm.   Pulmonary/Chest: Effort normal and breath sounds without rales or wheezing.  Abd:  Soft, NT, ND, + BS Neurological: Pt is alert. Not confused , motor grossly intact Skin: Skin is warm. No rash, no LE edema Psychiatric: Pt behavior is normal. No agitation.     Assessment & Plan:

## 2016-03-03 NOTE — Progress Notes (Signed)
Pre visit review using our clinic review tool, if applicable. No additional management support is needed unless otherwise documented below in the visit note. 

## 2016-03-03 NOTE — Patient Instructions (Signed)
Please take all new medication as prescribed - the protonix 40 mg per day  You can also take OTC zyrtec as needed for allergy symptoms  Please continue all other medications as before, and refills have been done if requested.  Please have the pharmacy call with any other refills you may need.  Please continue your efforts at being more active, low cholesterol diet, and weight control.  You are otherwise up to date with prevention measures today.  Please keep your appointments with your specialists as you may have planned  No blood work is needed today  Please return in 6 months, or sooner if needed, with Lab testing done 3-5 days before

## 2016-03-10 NOTE — Assessment & Plan Note (Signed)
Mild to mod, for protonix 40,,  to f/u any worsening symptoms or concerns

## 2016-03-10 NOTE — Assessment & Plan Note (Signed)
Mild to mod, for zyrtec/nasacort asd,  to f/u any worsening symptoms or concerns 

## 2016-03-10 NOTE — Assessment & Plan Note (Addendum)
stable overall by history and exam, recent data reviewed with pt, and pt to continue medical treatment as before,  to f/u any worsening symptoms or concerns Lab Results  Component Value Date   HGBA1C 6.4 08/29/2015

## 2016-03-10 NOTE — Assessment & Plan Note (Signed)
stable overall by history and exam, declines ortho referral, consider sport med in this office, and pt to continue medical treatment as before,  to f/u any worsening symptoms or concerns

## 2016-03-20 ENCOUNTER — Encounter: Payer: Self-pay | Admitting: Internal Medicine

## 2016-03-20 ENCOUNTER — Ambulatory Visit (INDEPENDENT_AMBULATORY_CARE_PROVIDER_SITE_OTHER): Payer: BLUE CROSS/BLUE SHIELD | Admitting: Internal Medicine

## 2016-03-20 VITALS — BP 128/92 | HR 76 | Ht 70.0 in | Wt 226.6 lb

## 2016-03-20 DIAGNOSIS — I429 Cardiomyopathy, unspecified: Secondary | ICD-10-CM | POA: Diagnosis not present

## 2016-03-20 DIAGNOSIS — Z95810 Presence of automatic (implantable) cardiac defibrillator: Secondary | ICD-10-CM

## 2016-03-20 DIAGNOSIS — I5022 Chronic systolic (congestive) heart failure: Secondary | ICD-10-CM

## 2016-03-20 DIAGNOSIS — I428 Other cardiomyopathies: Secondary | ICD-10-CM

## 2016-03-20 LAB — BASIC METABOLIC PANEL
BUN: 7 mg/dL (ref 7–25)
CALCIUM: 9.3 mg/dL (ref 8.6–10.3)
CHLORIDE: 103 mmol/L (ref 98–110)
CO2: 26 mmol/L (ref 20–31)
CREATININE: 1.13 mg/dL (ref 0.70–1.25)
Glucose, Bld: 138 mg/dL — ABNORMAL HIGH (ref 65–99)
Potassium: 4.3 mmol/L (ref 3.5–5.3)
SODIUM: 138 mmol/L (ref 135–146)

## 2016-03-20 LAB — CUP PACEART INCLINIC DEVICE CHECK
Implantable Lead Implant Date: 20160929
Implantable Lead Location: 753858
Implantable Lead Model: 3401
MDC IDC PG SERIAL: 118449
MDC IDC SESS DTM: 20170929115302

## 2016-03-20 NOTE — Progress Notes (Signed)
ELECTROPHYSIOLOGY OFFICE NOTE  Patient ID: Matthew Guerrero, MRN: [id number], DOB/AGE: 1955-09-14 60 y.o. Admit date: (Not on file) Date of Consult: 03/20/2016  Primary Physician: Cathlean Cower, MD Primary Cardiologist: [facility] Chief Complaint:  ICD   HPI Matthew Guerrero is a 60 y.o. male Seen in follow-up for S ICD implanted for primary prevention 9/16   He has a history of nonischemic cardiomyopathy. He presented 4/16 with acute heart failure. Ejection fraction was 15-20%. He underwent catheterization demonstrating normal coronary arteries.   8/16 demonstrated persistent LV dysfunction.  EF was 10-15%. There is also RV dysfunction.[name] DATE TEST    8/16    echo   EF 15 %   8/17    echo   EF 15 %     He has had TWOS in the secondary vector  now programmed in alternate  The patient denies chest pain or peripheral edema.  There have been no palpitations,  He has some PND/orthopnea--sporadic Orthostatic lightheadedness but minimal if gets up slowly   Records reviewed=-- office notes   3/17 Cr 1.14 K 4.3   Past Medical History:  Diagnosis Date  . ALLERGIC RHINITIS   . Asthma   . Cervical radiculitis   . CHF (congestive heart failure) ([place])   . Chronic systolic heart failure ([place]) 02/28/2015  . GERD (gastroesophageal reflux disease)   . GOUT   . Hyperglycemia 08/28/2014  . HYPERLIPIDEMIA   . HYPERTENSION   . Lateral epicondylitis of right elbow   . Nonischemic cardiomyopathy ([place]) 09/28/2014  . Obesity (BMI 30-39.9) 06/06/2015  . S/P cardiac cath wjith normal coronary arteries  09/28/2014  . Sleep apnea       Surgical History:  Past Surgical History:  Procedure Laterality Date  . COLONOSCOPY    . EP IMPLANTABLE DEVICE N/A 03/21/2015   Procedure: SubQ ICD Implant;  Surgeon: Deboraha Sprang, MD;  Location: [place] [facility];  Service: Cardiovascular;  Laterality: N/A;  . LEFT AND RIGHT HEART CATHETERIZATION WITH CORONARY ANGIOGRAM N/A 09/27/2014   Procedure: LEFT AND RIGHT HEART  CATHETERIZATION WITH CORONARY ANGIOGRAM;  Surgeon: Belva Crome, MD;  Location: [facility] CATH LAB;  Service: Cardiovascular;  Laterality: N/A;  . SUBQ ICD  03/21/2015     Home Meds: Prior to Admission medications   Medication Sig Start Date End Date Taking? Authorizing Provider  aspirin EC 81 MG EC tablet Take 1 tablet (81 mg total) by mouth daily. 09/28/14  Yes Isaiah Serge, NP  carvedilol (COREG) 25 MG tablet Take 1 tablet (25 mg total) by mouth 2 (two) times daily with a meal. 02/28/15  Yes Biagio Borg, MD  colchicine 0.6 MG tablet Take 1 tablet (0.6 mg total) by mouth daily. 02/28/15  Yes Biagio Borg, MD  furosemide (LASIX) 40 MG tablet Take 1 tablet (40 mg total) by mouth daily. 02/28/15  Yes Biagio Borg, MD  sacubitril-valsartan (ENTRESTO) 49-51 MG Take 1 tablet by mouth 2 (two) times daily. 09/30/14  Yes Pixie Casino, MD  spironolactone (ALDACTONE) 25 MG tablet Take 0.5 tablets (12.5 mg total) by mouth daily. 02/28/15  Yes Biagio Borg, MD  traMADol (ULTRAM) 50 MG tablet Take 1 tablet (50 mg total) by mouth every 8 (eight) hours as needed. 06/22/14  Yes Leonard Schwartz, MD      Allergies: No Known Allergies  Social History   Social History  . Marital status: Single    Spouse name: N/A  . Number of children: N/A  .  Years of education: N/A   Occupational History  . Not on file.   Social History Main Topics  . Smoking status: Never Smoker  . Smokeless tobacco: Never Used  . Alcohol use No  . Drug use: No  . Sexual activity: Not on file   Other Topics Concern  . Not on file   Social History Narrative  . No narrative on file     Family History  Problem Relation Age of Onset  . Lung cancer Mother   . Diabetes Father   . Heart disease Father   . Heart attack Father 56  . Hypertension Sister   . Aneurysm Sister 68    brain aneurysm  . Healthy Brother   . Healthy Brother   . Healthy Brother   . Colon cancer Neg Hx   . Esophageal cancer Neg Hx   . Rectal cancer Neg Hx   .  Stomach cancer Neg Hx      ROS:  Please see the history of present illness.     All other systems reviewed and negative.    Physical Exam: Blood pressure (!) 128/92, pulse 76, height 5\' 10"  (1.778 m), weight 226 lb 9.6 oz (102.8 kg), SpO2 97 %. General: Well developed, well nourished male in no acute distress. Head: Normocephalic, atraumatic, sclera non-icteric, no xanthomas, nares are without discharge. EENT: normal Lymph Nodes:  none Back: without scoliosis/kyphosis, no CVA tendersness Neck: Negative for carotid bruits. JVD not elevated. Lungs: Clear bilaterally to auscultation without wheezes, rales, or rhonchi. Breathing is unlabored. Device pocket well healed; without hematoma or erythema.  There is no tethering  Heart: RRR with S1 S2. No  murmur , rubs, or gallops appreciated. Abdomen: Soft, non-tender, non-distended with normoactive bowel sounds. No hepatomegaly. No rebound/guarding. No obvious abdominal masses. Msk:  Strength and tone appear normal for age. Extremities: No clubbing or cyanosis.  No edema.  Distal pedal pulses are 2+ and equal bilaterally. Skin: Warm and Dry Neuro: Alert and oriented X 3. CN III-XII intact Grossly normal sensory and motor function . Psych:  Responds to questions appropriately with a normal affect.      Labs: Cardiac Enzymes No results for input(s): CKTOTAL, CKMB, TROPONINI in the last 72 hours. CBC Lab Results  Component Value Date   WBC 7.9 02/17/2016   HGB 17.6 (H) 02/17/2016   HCT 55.1 (H) 02/17/2016   MCV 86.8 02/17/2016   PLT 130 (L) 02/17/2016   PROTIME: No results for input(s): LABPROT, INR in the last 72 hours. Chemistry No results for input(s): NA, K, CL, CO2, BUN, CREATININE, CALCIUM, PROT, BILITOT, ALKPHOS, ALT, AST, GLUCOSE in the last 168 hours.  Invalid input(s): LABALBU Lipids Lab Results  Component Value Date   CHOL 177 08/29/2015   HDL 36.60 (L) 08/29/2015   LDLCALC 103 (H) 08/29/2015   TRIG 185.0 (H)  08/29/2015   BNP No results found for: PROBNP Thyroid Function Tests: No results for input(s): TSH, T4TOTAL, T3FREE, THYROIDAB in the last 72 hours.  Invalid input(s): FREET3    Miscellaneous No results found for: DDIMER  Radiology/Studies:  No results found.  EKG: sinus @ 90  16/11/38    Assessment and Plan:  Nonischemic cardiomyopathy  Congestive heart failure-colonic-class II systolic  ICD SubQ  The patient's device was interrogated.  The information was reviewed. No changes were made in the programming.     Hypertension   Orthostatic lightheadedness  Hyperlipidemia  BP    High Risk Medication Surveillance  Continue Guideline directed medical therapy; uptitration limited by orthostatic lightheadedness  If continues w PND would use nocturnal NTG  Euvolemic continue current meds Reviewed salt and water issues with ChF  BP well controlled    Continue current meds       [name]

## 2016-03-20 NOTE — Patient Instructions (Addendum)
Medication Instructions: - Your physician recommends that you continue on your current medications as directed. Please refer to the Current Medication list given to you today.  Labwork: - Your physician recommends that you have lab work today: [employer]  Procedures/Testing: - none ordered  Follow-Up: - Remote monitoring is used to monitor your Pacemaker of ICD from home. This monitoring reduces the number of office visits required to check your device to one time per year. It allows [place] to keep an eye on the functioning of your device to ensure it is working properly. You are scheduled for a device check from home on 06/24/15. You may send your transmission at any time that day. If you have a wireless device, the transmission will be sent automatically. After your physician reviews your transmission, you will receive a postcard with your next transmission date.  - Your physician wants you to follow-up in: 1 year with Chanetta Marshall, NP for Dr. Caryl Comes. You will receive a reminder letter in the mail two months in advance. If you don't receive a letter, please call our office to schedule the follow-up appointment.  Any Additional Special Instructions Will Be Listed Below (If Applicable).     If you need a refill on your cardiac medications before your next appointment, please call your pharmacy.

## 2016-03-23 ENCOUNTER — Encounter: Payer: BLUE CROSS/BLUE SHIELD | Admitting: Internal Medicine

## 2016-04-03 ENCOUNTER — Ambulatory Visit (INDEPENDENT_AMBULATORY_CARE_PROVIDER_SITE_OTHER): Payer: BLUE CROSS/BLUE SHIELD | Admitting: Internal Medicine

## 2016-04-03 VITALS — BP 137/84 | HR 66 | Temp 98.1°F | Ht 70.0 in | Wt 227.2 lb

## 2016-04-03 DIAGNOSIS — E785 Hyperlipidemia, unspecified: Secondary | ICD-10-CM | POA: Diagnosis not present

## 2016-04-03 DIAGNOSIS — I10 Essential (primary) hypertension: Secondary | ICD-10-CM | POA: Diagnosis not present

## 2016-04-03 DIAGNOSIS — J309 Allergic rhinitis, unspecified: Secondary | ICD-10-CM | POA: Diagnosis not present

## 2016-04-03 MED ORDER — FLUTICASONE PROPIONATE 50 MCG/ACT NA SUSP: 1.0000 | Freq: Every day | NASAL | 2 refills | Status: DC

## 2016-04-03 MED ORDER — PRAVASTATIN SODIUM 40 MG PO TABS: 40.0000 mg | ORAL_TABLET | Freq: Every evening | ORAL | 2 refills | Status: DC

## 2016-04-03 NOTE — Patient Instructions (Signed)
Please continue to take your medications as prescribed.  We have prescribed Flonase for allergies, which should help with your congestion and cough.  We have prescribed Pravachol to lower your cholesterol levels, if you develop any muscle tenderness or notice muscle weakness please give [place] a call and stop taking this medication.  If you have any questions or concerns, please call the clinic at [phone] or if it is the weekend or after hours, you may call [phone] and ask for the internal medicine resident on call.

## 2016-04-03 NOTE — Assessment & Plan Note (Signed)
Endorses ongoing congestion and post-nasal drip and sputum production, has to clear throat often. Mild erythema of nasal turbinates and lungs clear to ausculation. Reports some improvement with recently-prescribed Zyrtec, but still having symptoms.  Plan: - Prescribed Flonase nasal spray - Continue Zyrtec daily

## 2016-04-03 NOTE — Assessment & Plan Note (Addendum)
Blood pressure well-controlled, 137/84 today with HR 66. Patient taking Entresto (Sacubitril-Valsartan) 49-51 mg BID and Coreg 25mg  BID for CHF/NICM with no orthostatic symptoms.

## 2016-04-03 NOTE — Progress Notes (Signed)
   CC: Establish care, health maintenance visit  HPI:  [name] is a 60 y.o. male with PMHx detailed below presenting to establish care. [name] is feeling very well overall and continues to adhere to a low-salt low-fat diet and exercises often. He endorses some ongoing congestion, post-nasal drip and phlegm production that was improved somewhat by recently-started Zyrtec.   See problem based assessment and plan below for additional details.  Past Medical History:  Diagnosis Date  . ALLERGIC RHINITIS   . Asthma   . Cervical radiculitis   . CHF (congestive heart failure) ([place])   . Chronic systolic heart failure ([place]) 02/28/2015  . GERD (gastroesophageal reflux disease)   . GOUT   . Hyperglycemia 08/28/2014  . HYPERLIPIDEMIA   . HYPERTENSION   . Lateral epicondylitis of right elbow   . Nonischemic cardiomyopathy ([place]) 09/28/2014  . Obesity (BMI 30-39.9) 06/06/2015  . S/P cardiac cath wjith normal coronary arteries  09/28/2014  . Sleep apnea     Review of Systems: Review of Systems  Constitutional: Negative for chills, fever, malaise/fatigue and weight loss.  HENT: Positive for congestion.   Respiratory: Positive for sputum production. Negative for cough, shortness of breath and wheezing.   Cardiovascular: Negative for chest pain, palpitations, orthopnea and leg swelling.  Gastrointestinal: Negative for abdominal pain, constipation, diarrhea, nausea and vomiting.  Musculoskeletal: Negative for back pain.  Neurological: Negative for dizziness.  All other systems reviewed and are negative.    Physical Exam: Vitals:   04/03/16 1549  BP: 137/84  Pulse: 66  Temp: 98.1 F (36.7 C)  TempSrc: Oral  SpO2: 97%  Weight: 227 lb 3.2 oz (103.1 kg)  Height: 5\' 10"  (1.778 m)   Body mass index is 32.6 kg/m. GENERAL- Casually-dressed gentleman sitting comfortably in exam room chair, alert, in no distress, conversational and pleasant HEENT- Atraumatic, moist mucous membranes,  oropharynx clear, edentulous, mildly erythematous nasal turbinates CARDIAC- Regular rate and rhythm, no murmurs, rubs or gallops. RESP- Clear to ascultation bilaterally, no wheezing or crackles, normal work of breathing ABDOMEN- Soft, nontender, nondistended BACK- Normal curvature, no spinal tenderness EXTREMITIES- Normal bulk and range of motion, no edema, 2+ peripheral pulses SKIN- Warm, dry, intact, without visible rash PSYCH- Appropriate affect, clear speech, thoughts linear and goal-directed  Assessment & Plan:   See encounters tab for problem based medical decision making.  Patient seen with Dr. Lynnae January

## 2016-04-03 NOTE — Assessment & Plan Note (Signed)
Patient making positive lifestyle and diet changes but recent lipid panel results and age put him at 9.8% 10-year risk of acute cardiac event per ASCVD calculation guidelines. Additonally he has significant non-ischemic cardiac issues with ICD in place. Moderate-intensity statin is indicated for primary prevention. There is mildly increased risk of myopathy with colchicine, although this typically occurs more at treatment doses.  Plan: - Start Pravastatin 40mg  QHS - Repeat lipid panel at next visit - Patient advised to monitor for muscle pain and/or weakness and to call us and stop taking statin if these symptoms should develop  Lipid Panel     Component Value Date/Time   CHOL 177 08/29/2015 0942   TRIG 185.0 (H) 08/29/2015 0942   HDL 36.60 (L) 08/29/2015 0942   CHOLHDL 5 08/29/2015 0942   VLDL 37.0 08/29/2015 0942   LDLCALC 103 (H) 08/29/2015 0942   LDLDIRECT 105.8 06/18/2010 [id number]

## 2016-04-06 NOTE — Progress Notes (Signed)
Internal Medicine Clinic Attending  I saw and evaluated the patient.  I personally confirmed the key portions of the history and exam documented by Dr. Johnson and I reviewed pertinent patient test results.  The assessment, diagnosis, and plan were formulated together and I agree with the documentation in the resident's note.  

## 2016-04-09 ENCOUNTER — Other Ambulatory Visit: Payer: Self-pay | Admitting: Internal Medicine

## 2016-04-09 DIAGNOSIS — I428 Other cardiomyopathies: Secondary | ICD-10-CM

## 2016-04-09 NOTE — Telephone Encounter (Signed)
REFILL 

## 2016-04-16 ENCOUNTER — Emergency Department (HOSPITAL_COMMUNITY): Payer: BLUE CROSS/BLUE SHIELD

## 2016-04-16 ENCOUNTER — Encounter (HOSPITAL_COMMUNITY): Payer: Self-pay

## 2016-04-16 ENCOUNTER — Emergency Department (HOSPITAL_COMMUNITY)
Admission: EM | Admit: 2016-04-16 | Discharge: 2016-04-16 | Disposition: A | Discharge status: Home / Self Care | Payer: BLUE CROSS/BLUE SHIELD | Attending: Emergency Medicine | Admitting: Emergency Medicine

## 2016-04-16 DIAGNOSIS — I5022 Chronic systolic (congestive) heart failure: Secondary | ICD-10-CM | POA: Diagnosis not present

## 2016-04-16 DIAGNOSIS — Z7982 Long term (current) use of aspirin: Secondary | ICD-10-CM | POA: Insufficient documentation

## 2016-04-16 DIAGNOSIS — J40 Bronchitis, not specified as acute or chronic: Secondary | ICD-10-CM | POA: Insufficient documentation

## 2016-04-16 DIAGNOSIS — I110 Hypertensive heart disease with heart failure: Secondary | ICD-10-CM | POA: Diagnosis not present

## 2016-04-16 DIAGNOSIS — R05 Cough: Secondary | ICD-10-CM | POA: Diagnosis present

## 2016-04-16 MED ORDER — AZITHROMYCIN 250 MG PO TABS: 250.0000 mg | ORAL_TABLET | Freq: Every day | ORAL | 0 refills | Status: DC

## 2016-04-16 MED ORDER — BENZONATATE 100 MG PO CAPS: 100.0000 mg | ORAL_CAPSULE | Freq: Three times a day (TID) | ORAL | 0 refills | Status: DC

## 2016-04-16 NOTE — ED Provider Notes (Signed)
[facility] DEPT Provider Note   CSN: [id number] Arrival date & time: 04/16/16  1115  By signing my name below, I, Evelene Croon, attest that this documentation has been prepared under the direction and in the presence of non-physician practitioner, Jeannett Senior, PA-C. Electronically Signed: Evelene Croon, Scribe. 04/16/2016. 12:38 PM.    History   Chief Complaint Chief Complaint  Patient presents with  . Cough  . Nasal Congestion    The history is provided by the patient. No language interpreter was used.     HPI Comments:  Matthew Guerrero is a 60 y.o. male who presents to the Emergency Department complaining of productive cough with yellow sputum x ~1 week. His cough is worse at night. He reports associated chest congestion and fatigue. He has taken alkaseltzer cold with mild relief. He denies rhinorrhea, fever, and SOB. No h/o asthma or bronchitis. He reports positive sick contacts; notes multiple people at his church with similar symptoms. Pt denies smoking history.   Past Medical History:  Diagnosis Date  . ALLERGIC RHINITIS   . Asthma   . Cervical radiculitis   . CHF (congestive heart failure) ([place])   . Chronic systolic heart failure ([place]) 02/28/2015  . GERD (gastroesophageal reflux disease)   . GOUT   . Hyperglycemia 08/28/2014  . HYPERLIPIDEMIA   . HYPERTENSION   . Lateral epicondylitis of right elbow   . Nonischemic cardiomyopathy ([place]) 09/28/2014  . Obesity (BMI 30-39.9) 06/06/2015  . S/P cardiac cath wjith normal coronary arteries  09/28/2014  . Sleep apnea     Patient Active Problem List   Diagnosis Date Noted  . Degenerative arthritis of left knee 03/03/2016  . GERD (gastroesophageal reflux disease) 03/03/2016  . Preventative health care 08/29/2015  . Obesity (BMI 30-39.9) 06/06/2015  . Polycythemia, secondary 05/21/2015  . NICM (nonischemic cardiomyopathy) ([place]) 03/21/2015  . Chronic systolic heart failure ([place]) 02/28/2015  . Excessive daytime  sleepiness 12/16/2014  . OSA (obstructive sleep apnea) 12/16/2014  . Nonischemic cardiomyopathy ([place]) 09/28/2014  . S/P cardiac cath wjith normal coronary arteries  09/28/2014  . Erythrocytosis and decreased platelet count. 09/28/2014  . Chest pain 09/25/2014  . Elevated troponin 09/25/2014  . Hyperglycemia 08/28/2014  . Cervical radiculitis 01/02/2012  . Hyperlipidemia 07/03/2010  . GOUT 07/01/2009  . Essential hypertension 07/01/2009  . Allergic rhinitis 07/01/2009  . COLONIC POLYPS, HX OF 07/01/2009    Past Surgical History:  Procedure Laterality Date  . COLONOSCOPY    . EP IMPLANTABLE DEVICE N/A 03/21/2015   Procedure: SubQ ICD Implant;  Surgeon: Deboraha Sprang, MD;  Location: [facility];  Service: Cardiovascular;  Laterality: N/A;  . LEFT AND RIGHT HEART CATHETERIZATION WITH CORONARY ANGIOGRAM N/A 09/27/2014   Procedure: LEFT AND RIGHT HEART CATHETERIZATION WITH CORONARY ANGIOGRAM;  Surgeon: Belva Crome, MD;  Location[facility] C[facility];  Service: Cardiovascular;  Laterality: N/A;  . SUBQ ICD  03/21/2015       Home Medications    Prior to Admission medications   Medication Sig Start Date End Date Taking? Authorizing Provider  aspirin 81 MG EC tablet Take 1 tablet (81 mg total) by mouth daily. 01/28/16   Pixie Casino, MD  carvedilol (COREG) 25 MG tablet Take 1 tablet (25 mg total) by mouth 2 (two) times daily with a meal. 08/29/15   Biagio Borg, MD  cetirizine (ZYRTEC) 10 MG tablet Take 1 tablet (10 mg total) by mouth daily. 03/03/16 03/03/17  Biagio Borg, MD  colchicine 0.6  MG tablet Take 1 tablet (0.6 mg total) by mouth daily. 12/06/15   Biagio Borg, MD  ENTRESTO 49-51 MG take 1 tablet by mouth twice a day 02/28/16   Biagio Borg, MD  fluticasone [facility]) 50 MCG/ACT nasal spray Place 1 spray into both nostrils daily. 04/03/16 04/03/17  Asencion Partridge, MD  furosemide (LASIX) 40 MG tablet Take 1 tablet (40 mg total) by mouth daily. 08/29/15   Biagio Borg, MD  Multiple Vitamin  (MULTIVITAMIN WITH MINERALS) TABS tablet Take 1 tablet by mouth daily.    Historical Provider, MD  pantoprazole (PROTONIX) 40 MG tablet Take 1 tablet (40 mg total) by mouth daily. 03/03/16   Biagio Borg, MD  pravastatin (PRAVACHOL) 40 MG tablet Take 1 tablet (40 mg total) by mouth every evening. 04/03/16 04/03/17  Asencion Partridge, MD  spironolactone (ALDACTONE) 25 MG tablet TAKE 1/2 TABLET BY MOUTH ONCE DAILY 04/09/16   Pixie Casino, MD  traMADol (ULTRAM) 50 MG tablet Take 1 tablet (50 mg total) by mouth every 12 (twelve) hours as needed (pain). 03/03/16   Biagio Borg, MD    Family History Family History  Problem Relation Age of Onset  . Lung cancer Mother   . Diabetes Father   . Heart disease Father   . Heart attack Father 16  . Hypertension Sister   . Aneurysm Sister 49    brain aneurysm  . Healthy Brother   . Healthy Brother   . Healthy Brother   . Colon cancer Neg Hx   . Esophageal cancer Neg Hx   . Rectal cancer Neg Hx   . Stomach cancer Neg Hx     Social History Social History  Substance Use Topics  . Smoking status: Never Smoker  . Smokeless tobacco: Never Used  . Alcohol use No     Allergies   Review of patient's allergies indicates no known allergies.   Review of Systems Review of Systems  Constitutional: Positive for fatigue. Negative for fever.  HENT: Positive for congestion. Negative for rhinorrhea.   Respiratory: Positive for cough. Negative for shortness of breath.      Physical Exam Updated Vital Signs BP 139/84 (BP Location: Right Arm)   Pulse 72   Temp 98 F (36.7 C) (Oral)   Resp 18   Ht 5\' 10"  (1.778 m)   Wt 227 lb (103 kg)   SpO2 97%   BMI 32.57 kg/m   Physical Exam  Constitutional: He is oriented to person, place, and time. He appears well-developed and well-nourished. No distress.  HENT:  Head: Normocephalic and atraumatic.  Right Ear: External ear normal.  Left Ear: External ear normal.  Nose: Nose normal.  Mouth/Throat:  Oropharynx is clear and moist.  TMs normal bilaterally  Eyes: Conjunctivae are normal.  Neck: Neck supple.  Cardiovascular: Normal rate, regular rhythm and normal heart sounds.   Pulmonary/Chest: Effort normal. No respiratory distress. He has no wheezes. He has no rales.  Abdominal: Soft. Bowel sounds are normal. He exhibits no distension. There is no tenderness. There is no rebound.  Musculoskeletal: He exhibits no edema.  Neurological: He is alert and oriented to person, place, and time.  Skin: Skin is warm and dry.  Psychiatric: He has a normal mood and affect.  Nursing note and vitals reviewed.    ED Treatments / Results  DIAGNOSTIC STUDIES:  Oxygen Saturation is 97% on RA, normal by my interpretation.    COORDINATION OF CARE:  12:08 PM Discussed  treatment plan with pt at bedside and pt agreed to plan.  Labs (all labs ordered are listed, but only abnormal results are displayed) Labs Reviewed - No data to display  EKG  EKG Interpretation None       Radiology Dg Chest 2 View  Result Date: 04/16/2016 CLINICAL DATA:  60 year old male with cough and mid chest pain for 5 days. Initial encounter. EXAM: CHEST  2 VIEW COMPARISON:  03/21/2015. FINDINGS: External defibrillator once again noted. No infiltrate, congestive heart failure or pneumothorax. Heart size within normal limits. No plain film evidence of pulmonary malignancy. Calcified aorta is minimally tortuous. Mild scoliosis thoracic spine convex right. IMPRESSION: No active cardiopulmonary disease. Electronically Signed   By: Genia Del M.D.   On: 04/16/2016 12:50    Procedures Procedures (including critical care time)  Medications Ordered in ED Medications - No data to display   Initial Impression / Assessment and Plan / ED Course  I have reviewed the triage vital signs and the nursing notes.  Pertinent labs & imaging results that were available during my care of the patient were reviewed by me and considered  in my medical decision making (see chart for details).  Clinical Course    Patient emergency department with cough for about a week. His vital signs are normal. He is afebrile. He states that he coughed so hard he felt like he was about to vomit. X-ray was obtained to rule out pneumonia and is negative. Given symptoms for a week, will start on azithromycin. Will give Tessalon for cough. Instructed to try over-the-counter cough suppressants as well as needed. Follow with primary care doctor.  Vitals:   04/16/16 1121 04/16/16 1122  BP: 139/84   Pulse: 72   Resp: 18   Temp: 98 F (36.7 C)   TempSrc: Oral   SpO2: 97%   Weight:  103 kg  Height:  5\' 10"  (1.778 m)     Final Clinical Impressions(s) / ED Diagnoses   Final diagnoses:  None    New Prescriptions New Prescriptions   No medications on file   I personally performed the services described in this documentation, which was scribed in my presence. The recorded information has been reviewed and is accurate.    Jeannett Senior, PA-C 04/16/16 [place], MD 04/16/16 2337

## 2016-04-16 NOTE — ED Notes (Signed)
Papers and medications reviewed and pt. Verbalizes understanding

## 2016-04-16 NOTE — ED Notes (Signed)
Patient returned from xray.

## 2016-04-16 NOTE — ED Triage Notes (Signed)
Patient here with 4-5 days of congestion and cough with some sputum, denies fever, no relief with otc meds

## 2016-04-16 NOTE — Discharge Instructions (Signed)
Take zithromax as prescribed until all gone. Take tessalon for cough. You can also try over the counter cough suppressants for additional relief. Follow up with your doctor for recheck.

## 2016-04-30 ENCOUNTER — Encounter: Payer: Self-pay | Admitting: Internal Medicine

## 2016-05-01 ENCOUNTER — Telehealth: Payer: Self-pay

## 2016-05-01 MED ORDER — COLCHICINE 0.6 MG PO TABS: 0.6000 mg | ORAL_TABLET | Freq: Every day | ORAL | 1 refills | Status: DC

## 2016-05-01 NOTE — Telephone Encounter (Signed)
Medication refill sent to pharmacy  

## 2016-06-22 ENCOUNTER — Encounter: Payer: Self-pay | Admitting: Internal Medicine

## 2016-06-22 DIAGNOSIS — F1120 Opioid dependence, uncomplicated: Secondary | ICD-10-CM | POA: Insufficient documentation

## 2016-06-23 ENCOUNTER — Ambulatory Visit (INDEPENDENT_AMBULATORY_CARE_PROVIDER_SITE_OTHER): Payer: BLUE CROSS/BLUE SHIELD | Admitting: *Deleted

## 2016-06-23 DIAGNOSIS — I428 Other cardiomyopathies: Secondary | ICD-10-CM | POA: Diagnosis not present

## 2016-06-26 ENCOUNTER — Encounter: Payer: Self-pay | Admitting: Cardiology

## 2016-07-01 LAB — CUP PACEART REMOTE DEVICE CHECK
Battery Remaining Percentage: 86 %
Date Time Interrogation Session: 20180102141500
Implantable Lead Implant Date: 20160929
Implantable Lead Location: 753858
Implantable Lead Model: 3401
Implantable Pulse Generator Implant Date: 20160929
Pulse Gen Serial Number: 118449

## 2016-07-17 ENCOUNTER — Ambulatory Visit (INDEPENDENT_AMBULATORY_CARE_PROVIDER_SITE_OTHER): Payer: BLUE CROSS/BLUE SHIELD | Admitting: Internal Medicine

## 2016-07-17 ENCOUNTER — Encounter: Payer: Self-pay | Admitting: Internal Medicine

## 2016-07-17 VITALS — BP 136/73 | HR 60 | Temp 98.1°F | Wt 220.8 lb

## 2016-07-17 DIAGNOSIS — M170 Bilateral primary osteoarthritis of knee: Secondary | ICD-10-CM

## 2016-07-17 DIAGNOSIS — J309 Allergic rhinitis, unspecified: Secondary | ICD-10-CM | POA: Diagnosis not present

## 2016-07-17 DIAGNOSIS — E785 Hyperlipidemia, unspecified: Secondary | ICD-10-CM | POA: Diagnosis not present

## 2016-07-17 DIAGNOSIS — Z6831 Body mass index (BMI) 31.0-31.9, adult: Secondary | ICD-10-CM | POA: Diagnosis not present

## 2016-07-17 DIAGNOSIS — N529 Male erectile dysfunction, unspecified: Secondary | ICD-10-CM | POA: Diagnosis not present

## 2016-07-17 DIAGNOSIS — F1120 Opioid dependence, uncomplicated: Secondary | ICD-10-CM

## 2016-07-17 DIAGNOSIS — E669 Obesity, unspecified: Secondary | ICD-10-CM

## 2016-07-17 DIAGNOSIS — Z8679 Personal history of other diseases of the circulatory system: Secondary | ICD-10-CM | POA: Diagnosis not present

## 2016-07-17 MED ORDER — SILDENAFIL CITRATE 25 MG PO TABS: 25.0000 mg | ORAL_TABLET | Freq: Every day | ORAL | 0 refills | Status: DC | PRN

## 2016-07-17 NOTE — Patient Instructions (Addendum)
Thank you for visiting today!  Keep up the great work with the weight loss!

## 2016-07-17 NOTE — Assessment & Plan Note (Signed)
His BMI remains elevated at 31.7 kg/m2 today. He has been able to lose some weight since last visit through diet/exercise (220 lbs from approx 227). Congratulated on this and encouraged to continue his efforts. He has a history of borderline HbA1c at 6.4 in 08/2015.  - Encourage continued diet/exercise/weight loss - Check HbA1c and CMP next visit

## 2016-07-17 NOTE — Progress Notes (Signed)
   CC: Erectile dysfunction, hyperlipemia follow up  HPI:  [name] is a 61 y.o. male with PMHx detailed below presenting for routine follow up visit. He was started on Pravastatin last visit and has noticed no side effects. He has also experienced symptom relief with Zyrtec and Flonase for allergies and congestion. He has no acute complaints today but inquires about medication for erectile dysfunction. He has noticed difficulty maintaining an erection 10-20% of the time and is distressed by this. He did have an ED visit in October for cough/bronchitis which resolved with Azithromycin. Otherwise he feels healthy and active and has been able to lose about 7 lbs since October.  See problem based assessment and plan below for additional details.  Past Medical History:  Diagnosis Date  . ALLERGIC RHINITIS   . Asthma   . Cervical radiculitis   . CHF (congestive heart failure) ([place])   . Chronic systolic heart failure ([place]) 02/28/2015  . GERD (gastroesophageal reflux disease)   . GOUT   . Hyperglycemia 08/28/2014  . HYPERLIPIDEMIA   . HYPERTENSION   . Lateral epicondylitis of right elbow   . Nonischemic cardiomyopathy ([place]) 09/28/2014  . Obesity (BMI 30-39.9) 06/06/2015  . S/P cardiac cath wjith normal coronary arteries  09/28/2014  . Sleep apnea     Review of Systems: Review of Systems  Constitutional: Positive for weight loss (intentional). Negative for chills, fever and malaise/fatigue.  Respiratory: Negative for cough, sputum production and shortness of breath.   Cardiovascular: Negative for chest pain, palpitations, orthopnea and leg swelling.  Gastrointestinal: Negative for abdominal pain, blood in stool, constipation, diarrhea, nausea and vomiting.  Genitourinary: Negative for dysuria, frequency, hematuria and urgency.  Musculoskeletal: Positive for joint pain (knees).  Skin: Negative for rash.     Physical Exam: Vitals:   07/17/16 1406  BP: 136/73  Pulse: 60  Temp:  98.1 F (36.7 C)  TempSrc: Oral  SpO2: 98%  Weight: 220 lb 12.8 oz (100.2 kg)   Body mass index is 31.68 kg/m. GENERAL- Gentleman sitting comfortably in exam room chair, alert, in no distress, conversational HEENT- Atraumatic, PERRL, moist mucous membranes CARDIAC- Regular rate and rhythm, no murmurs, rubs or gallops. RESP/CHEST- Clear to ascultation bilaterally, no wheezing or crackles, normal work of breathing, no gynecomastia ABDOMEN- Soft, nontender, nondistended EXTREMITIES- Normal bulk and range of motion, no edema, 2+ peripheral pulses SKIN- Warm, dry, intact, without rash PSYCH- Positve affect, clear speech  Assessment & Plan:   See encounters tab for problem based medical decision making.  Patient discussed with Dr. Dareen Piano

## 2016-07-17 NOTE — Assessment & Plan Note (Signed)
Reports difficulty maintaining erection during about 10-20% of the time. Denies any urinary symptoms, no anxiety/depression, reports still having AM erections. He has a history of severe NICM EF 15% but is not taking any nitrates. Discussed a trial of a Sildenafil and the side effects to watch out for. Also advised him to tell providers in the future about this medication if they start new medicines for his heart.  Plan: - Provided Rx Sildenafil (Viagra) 25 mg tablet, #20

## 2016-07-17 NOTE — Assessment & Plan Note (Signed)
Rarely takes one Tramadol 50 mg (2-3 times weekly) for knee arthritis pain. His job requires some degree of manual labor. He reports taking  Ibuprofen sometimes instead for relief. He is not persistently requiring Tramadol for pain control and this controlled medication seems unecessary. Advised to take Ibuprofen and/or Tylenol instead and he voiced agreement.   - Ibuprofen / Tylenol for OA pain, dc Tramadol from med list

## 2016-07-17 NOTE — Assessment & Plan Note (Signed)
Started Pravastatin last visit for primary cardiovascular prevention. Has reported no new symptoms and tolerating this medication well.  - Last lipid panel in 08/215, repeat lipid panel at next visit.

## 2016-07-17 NOTE — Assessment & Plan Note (Signed)
Allergy / congestion symptoms have resolved. Reports adherence with Zyrtec and Flonase. Advised that he can take these PRN.

## 2016-07-20 NOTE — Progress Notes (Signed)
Internal Medicine Clinic Attending  Case discussed with Dr. Johnson at the time of the visit.  We reviewed the resident's history and exam and pertinent patient test results.  I agree with the assessment, diagnosis, and plan of care documented in the resident's note.  

## 2016-07-27 ENCOUNTER — Encounter: Payer: Self-pay | Admitting: Internal Medicine

## 2016-07-27 ENCOUNTER — Ambulatory Visit (INDEPENDENT_AMBULATORY_CARE_PROVIDER_SITE_OTHER): Payer: BLUE CROSS/BLUE SHIELD | Admitting: Internal Medicine

## 2016-07-27 VITALS — BP 117/71 | HR 63 | Ht 70.0 in | Wt 217.4 lb

## 2016-07-27 DIAGNOSIS — I10 Essential (primary) hypertension: Secondary | ICD-10-CM

## 2016-07-27 DIAGNOSIS — Z95810 Presence of automatic (implantable) cardiac defibrillator: Secondary | ICD-10-CM | POA: Diagnosis not present

## 2016-07-27 DIAGNOSIS — G4733 Obstructive sleep apnea (adult) (pediatric): Secondary | ICD-10-CM | POA: Diagnosis not present

## 2016-07-27 DIAGNOSIS — I5022 Chronic systolic (congestive) heart failure: Secondary | ICD-10-CM

## 2016-07-27 DIAGNOSIS — I428 Other cardiomyopathies: Secondary | ICD-10-CM | POA: Diagnosis not present

## 2016-07-27 MED ORDER — SACUBITRIL-VALSARTAN 49-51 MG PO TABS: 1.0000 | ORAL_TABLET | Freq: Two times a day (BID) | ORAL | 5 refills | Status: DC

## 2016-07-27 NOTE — Progress Notes (Signed)
OFFICE NOTE  Chief Complaint:  Routine follow-up  Primary Care Physician: Asencion Partridge, MD  HPI:  Matthew Guerrero is a 61 y.o. male with a hx of HTN, HL, gout, asthma. He was seen in the emergency room in late March with abdominal pain and vomiting. ECG was abnormal and a fast scan demonstrated no pericardial effusion but global hypokinesis. Plan was to follow-up as an outpatient. However, he presented back to the emergency room with progressive shortness of breath and chest pain with exertion. Admitted 4/5-4/8 with acute systolic HF. EF was noted to be 15-20% on echo. Troponin was minimally elevated (0.14). He was diuresed. LHC was arranged and demonstrated normal coronary arteries. He was dx with NICM. Medications were adjusted for CHF and he returns for FU. Of note, ACE inhibitor was stopped and he was placed on Entresto.   Since DC, he is doing well. His breathing is improved. He is NYHA 2b. He denies orthopnea, PND, edema. He denies chest pain, syncope. Does admit to snoring and does take daytime naps. He has a non-productive cough.    Studies/Reports Reviewed Today:  Echo 09/27/14 - Mild LVH. EF 15% to 20%. Diffuse hypokinesis. There is akinesis of the entireanteroseptal myocardium. Grade 1 diastolic dysfunction. There is muscular trabeculation at apex of left ventricle (no obvious thrombus identified). Definity contrast used. - Mitral valve: There was mild regurgitation. - Left atrium: The atrium was moderately dilated. - Right ventricle: The cavity size was moderately dilated. Wall thickness was normal. - Right atrium: The atrium was mildly dilated.  LHC 09/27/14 The left main coronary artery is normal. The left anterior descending artery is normal. The left circumflex artery is normal. The right coronary artery is dominant and normal. EF: 15%.  Matthew Guerrero has an upcoming sleep study which should be helpful hopefully and may be contributing to his  erythrocytosis. I suspect the etiology of his heart failure is hypertensive heart disease. There remains to be seen whether he'll have improvement in LV function. He seems to be tolerating his medications at this point. He does get somewhat fatigued with walking up stairs and doing activities. I suspect a benefit from cardiac rehabilitation.  I saw Matthew Guerrero back in the office today. He is here for a limited echo follow-up which was performed on 02/08/2015. Unfortunately this shows the EF to to remain between 10 and 15%. You have his medication shows near optimal therapy as he is currently on aspirin, carvedilol, Lasix, and Entresto 49/51 mg. He seems to be tolerating the medications and has NYHA class II symptoms.  Matthew Guerrero returns today for follow-up. He recently had placement of an AICD for primary prevention of sudden cardiac death by Dr. Caryl Comes in 03/25/2023. This went well and since then he his not had any significant problems. He denies any device firings. He had one episode which was difficult to understand that happened in church but required some reprogramming. Weight is stable at 255 which was his weight in March 25, 2023. He continues to be compliant with CPAP and has an appointment to see Dr. Radford Pax for reevaluation in December. He is also seeing Dr. Caryl Comes back in January. He is currently applying for disability. It should be noted that he has class II symptoms and does get short of breath with moderate exertion and is unable to lift more than 10 pounds routinely. His previous job required more physical work than this.  10/25/2015  Matthew Guerrero returns today for follow-up. He's done exceedingly well. He  seems to be tolerating Entresto at the 49/50 one dose. His weight has been stable, in fact it is improved to 230 pounds. He denies any worsening shortness of breath. He currently has class 1-2 symptoms. He's had no AICD firings. He uses CPAP and is been compliant with that. He has a follow-up with  Dr. Radford Pax this summer. He also has an ICD clinic follow-up as well. He is requesting a handicap parking sticker today which I will provide given the significance of his cardiomyopathy.   07/27/2016  Matthew Guerrero was seen in the office for follow-up today. He continues to do very well. He is completely asymptomatic. Weight is now down further from 230 pounds to 217 pounds. He is on the middle dose of interest oh by hesitant to increase the dose higher due to his blood pressure. He is also on a high dose of carvedilol, spironolactone, aspirin and Lasix. He has had no indication of worsening lead impedance on his AICD remote checks. He denies any shocks. He continues to have class 1-2 symptoms. His last echo in August 2017 showed an EF of 15% without any significant improvement. He remains compliant with CPAP.  PMHx:  Past Medical History:  Diagnosis Date  . ALLERGIC RHINITIS   . Asthma   . Cervical radiculitis   . CHF (congestive heart failure) ([place])   . Chronic systolic heart failure ([place]) 02/28/2015  . GERD (gastroesophageal reflux disease)   . GOUT   . Hyperglycemia 08/28/2014  . HYPERLIPIDEMIA   . HYPERTENSION   . Lateral epicondylitis of right elbow   . Nonischemic cardiomyopathy ([place]) 09/28/2014  . Obesity (BMI 30-39.9) 06/06/2015  . S/P cardiac cath wjith normal coronary arteries  09/28/2014  . Sleep apnea     Past Surgical History:  Procedure Laterality Date  . COLONOSCOPY    . EP IMPLANTABLE DEVICE N/A 03/21/2015   Procedure: SubQ ICD Implant;  Surgeon: Deboraha Sprang, MD;  Location: [place] [facility];  Service: Cardiovascular;  Laterality: N/A;  . LEFT AND RIGHT HEART CATHETERIZATION WITH CORONARY ANGIOGRAM N/A 09/27/2014   Procedure: LEFT AND RIGHT HEART CATHETERIZATION WITH CORONARY ANGIOGRAM;  Surgeon: Belva Crome, MD;  Location: [facility] CATH LAB;  Service: Cardiovascular;  Laterality: N/A;  . SUBQ ICD  03/21/2015    FAMHx:  Family History  Problem Relation Age of Onset  . Lung  cancer Mother   . Diabetes Father   . Heart disease Father   . Heart attack Father 58  . Hypertension Sister   . Aneurysm Sister 17    brain aneurysm  . Healthy Brother   . Healthy Brother   . Healthy Brother   . Colon cancer Neg Hx   . Esophageal cancer Neg Hx   . Rectal cancer Neg Hx   . Stomach cancer Neg Hx     SOCHx:   reports that he has never smoked. He has never used smokeless tobacco. He reports that he does not drink alcohol or use drugs.  ALLERGIES:  No Known Allergies  ROS: Pertinent items noted in HPI and remainder of comprehensive ROS otherwise negative.  HOME MEDS: Current Outpatient Prescriptions  Medication Sig Dispense Refill  . aspirin 81 MG EC tablet Take 1 tablet (81 mg total) by mouth daily. 30 tablet 3  . carvedilol (COREG) 25 MG tablet Take 1 tablet (25 mg total) by mouth 2 (two) times daily with a meal. 180 tablet 3  . cetirizine (ZYRTEC) 10 MG tablet Take 1 tablet (  10 mg total) by mouth daily. 30 tablet 11  . colchicine 0.6 MG tablet Take 1 tablet (0.6 mg total) by mouth daily. 90 tablet 1  . ENTRESTO 49-51 MG take 1 tablet by mouth twice a day 60 tablet 5  . fluticasone (FLONASE) 50 MCG/ACT nasal spray Place 1 spray into both nostrils daily. 16 g 2  . furosemide (LASIX) 40 MG tablet Take 1 tablet (40 mg total) by mouth daily. 90 tablet 3  . Multiple Vitamin (MULTIVITAMIN WITH MINERALS) TABS tablet Take 1 tablet by mouth daily.    . pantoprazole (PROTONIX) 40 MG tablet Take 1 tablet (40 mg total) by mouth daily. 90 tablet 3  . pravastatin (PRAVACHOL) 40 MG tablet Take 1 tablet (40 mg total) by mouth every evening. 90 tablet 2  . sildenafil (VIAGRA) 25 MG tablet Take 1 tablet (25 mg total) by mouth daily as needed for erectile dysfunction. 20 tablet 0  . spironolactone (ALDACTONE) 25 MG tablet TAKE 1/2 TABLET BY MOUTH ONCE DAILY 45 tablet 3   No current facility-administered medications for this visit.     LABS/IMAGING: No results found for this  or any previous visit (from the past 48 hour(s)). No results found.  WEIGHTS: Wt Readings from Last 3 Encounters:  07/17/16 220 lb 12.8 oz (100.2 kg)  04/16/16 227 lb (103 kg)  04/03/16 227 lb 3.2 oz (103.1 kg)    VITALS: There were no vitals taken for this visit.  EXAM: General appearance: alert and no distress Neck: no carotid bruit, no JVD and thyroid not enlarged, symmetric, no tenderness/mass/nodules Lungs: clear to auscultation bilaterally Heart: regular rate and rhythm, S1, S2 normal, no murmur, click, rub or gallop and AICD pocket is intact Abdomen: soft, non-tender; bowel sounds normal; no masses,  no organomegaly Extremities: extremities normal, atraumatic, no cyanosis or edema Pulses: 2+ and symmetric Skin: Skin color, texture, turgor normal. No rashes or lesions Neurologic: Grossly normal Psych: Pleasant  EKG: Sinus rhythm at 63, LVH with ST and T-wave changes   ASSESSMENT: 1. Nonischemic, probably hypertensive cardiomyopathy, EF 15% with normal coronaries-NYHA class I-II symptoms 2. Hypertension-controlled 3. Dyslipidemia 4. OSA on CPAP 5. Gout 6. Status post AICD for primary prevention of sudden cardiac death  PLAN: 1.   Matthew Guerrero is doing well clinically. His weight continues to decrease with activity and diet changes. He is euvolemic on exam. He's had no AICD shocks. Blood pressure is well-controlled. He is tolerating Entresto at his current dose. I like to reassess his LVEF in August which would be one year since his last echocardiogram. He is compliant with his CPAP. Follow-up with me in 6 months.   Pixie Casino, MD, [facility] Attending Cardiologist [place] 07/27/2016, 8:00 AM

## 2016-07-27 NOTE — Patient Instructions (Addendum)
Your physician has requested that you have an echocardiogram in 6 months @ [address] - [address]rd [address]. Echocardiography is a painless test that uses sound waves to create images of your heart. It provides your doctor with information about the size and shape of your heart and how well your heart's chambers and valves are working. This procedure takes approximately one hour. There are no restrictions for this procedure.  Your physician wants you to follow-up in: 6 months with Dr. Debara Pickett - after echocardiogram. You will receive a reminder letter in the mail two months in advance. If you don't receive a letter, please call our office to schedule the follow-up appointment.

## 2016-08-03 ENCOUNTER — Other Ambulatory Visit: Payer: Self-pay | Admitting: Internal Medicine

## 2016-08-17 ENCOUNTER — Encounter: Payer: Self-pay | Admitting: Hematology and Oncology

## 2016-08-17 ENCOUNTER — Ambulatory Visit (HOSPITAL_BASED_OUTPATIENT_CLINIC_OR_DEPARTMENT_OTHER): Payer: BLUE CROSS/BLUE SHIELD | Admitting: Hematology and Oncology

## 2016-08-17 ENCOUNTER — Other Ambulatory Visit (HOSPITAL_BASED_OUTPATIENT_CLINIC_OR_DEPARTMENT_OTHER): Payer: BLUE CROSS/BLUE SHIELD

## 2016-08-17 DIAGNOSIS — D751 Secondary polycythemia: Secondary | ICD-10-CM

## 2016-08-17 DIAGNOSIS — D696 Thrombocytopenia, unspecified: Secondary | ICD-10-CM | POA: Diagnosis not present

## 2016-08-17 LAB — CBC WITH DIFFERENTIAL/PLATELET
BASO%: 0.4 % (ref 0.0–2.0)
Basophils Absolute: 0 10*3/uL (ref 0.0–0.1)
EOS%: 1.6 % (ref 0.0–7.0)
Eosinophils Absolute: 0.1 10*3/uL (ref 0.0–0.5)
HCT: 52.3 % — ABNORMAL HIGH (ref 38.4–49.9)
HGB: 17.1 g/dL (ref 13.0–17.1)
LYMPH%: 41.3 % (ref 14.0–49.0)
MCH: 29 pg (ref 27.2–33.4)
MCHC: 32.6 g/dL (ref 32.0–36.0)
MCV: 88.9 fL (ref 79.3–98.0)
MONO#: 0.5 10*3/uL (ref 0.1–0.9)
MONO%: 8.1 % (ref 0.0–14.0)
NEUT%: 48.6 % (ref 39.0–75.0)
NEUTROS ABS: 2.9 10*3/uL (ref 1.5–6.5)
Platelets: 128 10*3/uL — ABNORMAL LOW (ref 140–400)
RBC: 5.89 10*6/uL — AB (ref 4.20–5.82)
RDW: 14.6 % (ref 11.0–14.6)
WBC: 6 10*3/uL (ref 4.0–10.3)
lymph#: 2.5 10*3/uL (ref 0.9–3.3)

## 2016-08-17 NOTE — Assessment & Plan Note (Signed)
Secondary Polycythemia: Negative JAK2 V617F mutation. Erythropoeitin 18.6 (mildly elevated) 10/26/14 Recommendation: 1. Most likely related to underlying cardiac disease 2. Phlebotomy is not the standard of care for secondary polycythemia. However if his Hb crosses 20, Or if he develops symptoms of congestion, we can consider doing it.  Today's hemoglobin is 17.6 and platelet count is 130 I provided a copy of these labs to the patient. I'm very happy with the stability of his hemoglobin. We will continue to watch and monitor him.  Thrombocytopenia: Suspicious for low grade ITP (Large platelets on smear suggest increased destruction). Today is 130.   Return to clinic in 6 months with labs and follow up

## 2016-08-17 NOTE — Progress Notes (Signed)
Patient Care Team: Asencion Partridge, MD as PCP - General (Internal Medicine)  DIAGNOSIS:  Encounter Diagnosis  Name Primary?  . Polycythemia, secondary    CHIEF COMPLIANT: Follow-up of polycythemia, denies any new symptoms  INTERVAL HISTORY: Matthew Guerrero is a 61 year old with above-mentioned history of polycythemia who is currently on surveillance. We diagnosed him with secondary polycythemia. He also had mild thrombocytopenia which we felt was related to low-grade ITP. He denies any recent problems with headaches or dizziness or blurred vision. He has been feeling quite well. Denies any bruising or bleeding.  REVIEW OF SYSTEMS:   Constitutional: Denies fevers, chills or abnormal weight loss Eyes: Denies blurriness of vision Ears, nose, mouth, throat, and face: Denies mucositis or sore throat Respiratory: Denies cough, dyspnea or wheezes Cardiovascular: Denies palpitation, chest discomfort Gastrointestinal:  Denies nausea, heartburn or change in bowel habits Skin: Denies abnormal skin rashes Lymphatics: Denies new lymphadenopathy or easy bruising Neurological:Denies numbness, tingling or new weaknesses Behavioral/Psych: Mood is stable, no new changes  Extremities: No lower extremity edema All other systems were reviewed with the patient and are negative.  I have reviewed the past medical history, past surgical history, social history and family history with the patient and they are unchanged from previous note.  ALLERGIES:  has No Known Allergies.  MEDICATIONS:  Current Outpatient Prescriptions  Medication Sig Dispense Refill  . aspirin 81 MG EC tablet Take 1 tablet (81 mg total) by mouth daily. 30 tablet 3  . carvedilol (COREG) 25 MG tablet take 1 tablet by mouth twice a day with meals 180 tablet 3  . cetirizine (ZYRTEC) 10 MG tablet Take 1 tablet (10 mg total) by mouth daily. 30 tablet 11  . colchicine 0.6 MG tablet Take 1 tablet (0.6 mg total) by mouth daily. 90 tablet 1    . fluticasone (FLONASE) 50 MCG/ACT nasal spray Place 1 spray into both nostrils daily. 16 g 2  . furosemide (LASIX) 20 MG tablet take 2 tablets by mouth once daily 180 tablet 1  . furosemide (LASIX) 40 MG tablet Take 1 tablet (40 mg total) by mouth daily. 90 tablet 3  . Multiple Vitamin (MULTIVITAMIN WITH MINERALS) TABS tablet Take 1 tablet by mouth daily.    . pantoprazole (PROTONIX) 40 MG tablet Take 1 tablet (40 mg total) by mouth daily. 90 tablet 3  . pravastatin (PRAVACHOL) 40 MG tablet Take 1 tablet (40 mg total) by mouth every evening. 90 tablet 2  . sacubitril-valsartan (ENTRESTO) 49-51 MG Take 1 tablet by mouth 2 (two) times daily. 60 tablet 5  . sildenafil (VIAGRA) 25 MG tablet Take 1 tablet (25 mg total) by mouth daily as needed for erectile dysfunction. 20 tablet 0  . spironolactone (ALDACTONE) 25 MG tablet TAKE 1/2 TABLET BY MOUTH ONCE DAILY 45 tablet 3   No current facility-administered medications for this visit.     PHYSICAL EXAMINATION: ECOG PERFORMANCE STATUS: 0 - Asymptomatic  Vitals:   08/17/16 0806  BP: 124/77  Pulse: 65  Resp: 18  Temp: 98 F (36.7 C)   Filed Weights   08/17/16 0806  Weight: 209 lb 11.2 oz (95.1 kg)    GENERAL:alert, no distress and comfortable SKIN: skin color, texture, turgor are normal, no rashes or significant lesions EYES: normal, Conjunctiva are pink and non-injected, sclera clear OROPHARYNX:no exudate, no erythema and lips, buccal mucosa, and tongue normal  NECK: supple, thyroid normal size, non-tender, without nodularity LYMPH:  no palpable lymphadenopathy in the cervical, axillary or inguinal LUNGS:  clear to auscultation and percussion with normal breathing effort HEART: regular rate & rhythm and no murmurs and no lower extremity edema ABDOMEN:abdomen soft, non-tender and normal bowel sounds MUSCULOSKELETAL:no cyanosis of digits and no clubbing  NEURO: alert & oriented x 3 with fluent speech, no focal motor/sensory  deficits EXTREMITIES: No lower extremity edema  LABORATORY DATA:  I have reviewed the data as listed   Chemistry      Component Value Date/Time   NA 138 03/20/2016 1021   K 4.3 03/20/2016 1021   CL 103 03/20/2016 1021   CO2 26 03/20/2016 1021   BUN 7 03/20/2016 1021   CREATININE 1.13 03/20/2016 1021      Component Value Date/Time   CALCIUM 9.3 03/20/2016 1021   ALKPHOS 45 08/29/2015 0942   AST 24 08/29/2015 0942   ALT 22 08/29/2015 0942   BILITOT 0.5 08/29/2015 0942       Lab Results  Component Value Date   WBC 6.0 08/17/2016   HGB 17.1 08/17/2016   HCT 52.3 (H) 08/17/2016   MCV 88.9 08/17/2016   PLT 128 (L) 08/17/2016   NEUTROABS 2.9 08/17/2016    ASSESSMENT & PLAN:  Polycythemia, secondary Secondary Polycythemia: Negative JAK2 V617F mutation. Erythropoeitin 18.6 (mildly elevated) 10/26/14 Recommendation: 1. Most likely related to underlying cardiac disease 2. Phlebotomy is not the standard of care for secondary polycythemia. However if his Hb crosses 20, Or if he develops symptoms of congestion, we can consider doing it.  Today's hemoglobin is 17.1 and platelet count is 128 I provided a copy of these labs to the patient. I'm very happy with the stability of his hemoglobin. We will continue to watch and monitor him.  Thrombocytopenia: Suspicious for low grade ITP (Large platelets on smear suggest increased destruction). Today is 128.   Return to clinic in 6 months with labs and follow up And if his blood counts remain stable, he can be seen once a year.   I spent 25 minutes talking to the patient of which more than half was spent in counseling and coordination of care.  No orders of the defined types were placed in this encounter.  The patient has a good understanding of the overall plan. he agrees with it. he will call with any problems that may develop before the next visit here.   Rulon Eisenmenger, MD 08/17/16

## 2016-09-22 ENCOUNTER — Ambulatory Visit (INDEPENDENT_AMBULATORY_CARE_PROVIDER_SITE_OTHER): Payer: BLUE CROSS/BLUE SHIELD | Admitting: *Deleted

## 2016-09-22 DIAGNOSIS — I428 Other cardiomyopathies: Secondary | ICD-10-CM

## 2016-09-22 NOTE — Progress Notes (Signed)
Remote ICD transmission.   

## 2016-09-23 ENCOUNTER — Encounter: Payer: Self-pay | Admitting: Cardiology

## 2016-09-23 LAB — CUP PACEART REMOTE DEVICE CHECK
Date Time Interrogation Session: 20180403115200
Implantable Lead Location: 753862
MDC IDC LEAD IMPLANT DT: 20160929
MDC IDC MSMT BATTERY REMAINING PERCENTAGE: 83 %
MDC IDC PG IMPLANT DT: 20160929
Pulse Gen Serial Number: 118449

## 2016-10-21 ENCOUNTER — Ambulatory Visit (INDEPENDENT_AMBULATORY_CARE_PROVIDER_SITE_OTHER): Payer: Medicaid Other | Admitting: Internal Medicine

## 2016-10-21 VITALS — BP 118/75 | HR 59 | Temp 98.1°F | Wt 200.9 lb

## 2016-10-21 DIAGNOSIS — M1712 Unilateral primary osteoarthritis, left knee: Secondary | ICD-10-CM | POA: Diagnosis not present

## 2016-10-21 DIAGNOSIS — I509 Heart failure, unspecified: Secondary | ICD-10-CM

## 2016-10-21 DIAGNOSIS — Z79899 Other long term (current) drug therapy: Secondary | ICD-10-CM | POA: Diagnosis not present

## 2016-10-21 DIAGNOSIS — N529 Male erectile dysfunction, unspecified: Secondary | ICD-10-CM | POA: Diagnosis present

## 2016-10-21 DIAGNOSIS — Z8709 Personal history of other diseases of the respiratory system: Secondary | ICD-10-CM | POA: Diagnosis not present

## 2016-10-21 DIAGNOSIS — D751 Secondary polycythemia: Secondary | ICD-10-CM

## 2016-10-21 DIAGNOSIS — G4733 Obstructive sleep apnea (adult) (pediatric): Secondary | ICD-10-CM | POA: Diagnosis not present

## 2016-10-21 DIAGNOSIS — Z6828 Body mass index (BMI) 28.0-28.9, adult: Secondary | ICD-10-CM

## 2016-10-21 DIAGNOSIS — I110 Hypertensive heart disease with heart failure: Secondary | ICD-10-CM | POA: Diagnosis not present

## 2016-10-21 DIAGNOSIS — I10 Essential (primary) hypertension: Secondary | ICD-10-CM

## 2016-10-21 DIAGNOSIS — R7303 Prediabetes: Secondary | ICD-10-CM | POA: Diagnosis not present

## 2016-10-21 DIAGNOSIS — E785 Hyperlipidemia, unspecified: Secondary | ICD-10-CM | POA: Diagnosis present

## 2016-10-21 DIAGNOSIS — J309 Allergic rhinitis, unspecified: Secondary | ICD-10-CM

## 2016-10-21 DIAGNOSIS — Z9989 Dependence on other enabling machines and devices: Secondary | ICD-10-CM | POA: Diagnosis not present

## 2016-10-21 LAB — POCT GLYCOSYLATED HEMOGLOBIN (HGB A1C): HEMOGLOBIN A1C: 6.1

## 2016-10-21 LAB — GLUCOSE, CAPILLARY: GLUCOSE-CAPILLARY: 90 mg/dL (ref 65–99)

## 2016-10-21 MED ORDER — FUROSEMIDE 40 MG PO TABS: 40.0000 mg | ORAL_TABLET | Freq: Every day | ORAL | 6 refills | Status: DC

## 2016-10-21 NOTE — Assessment & Plan Note (Signed)
Reports no issues with knee pain and months (mentioned in previous visits), has not required ibuprofen, Tylenol, tramadol. These improvements are likely due to his continued weight loss.  Plan: -- Encouraged continued weight loss

## 2016-10-21 NOTE — Assessment & Plan Note (Signed)
Follow lipid profile, checked today.  Plan: -- Continue Pravachol 40 mg daily

## 2016-10-21 NOTE — Assessment & Plan Note (Signed)
Reports no recent issues with congestion, rhinorrhea, seasonal allergies and has not been taking his Zyrtec or Flonase.  Plan: -- Discontinue Zyrtec and Flonase from his medication list

## 2016-10-21 NOTE — Assessment & Plan Note (Addendum)
Reports that he had no side effects or issues with his Viagra prescription, no dizziness, headache, palpitations, etc. He reports that he no longer has any issues with erectile dysfunction, and no longer requires this prescription. Clinical improvements likely due to weight loss.  Plan: -- Removed sildenafil from medication list

## 2016-10-21 NOTE — Assessment & Plan Note (Signed)
A1c today and it is 6.1% from 6.4 when last checked. He has consistently been losing weight through diet and exercise.  Plan: -- Encouraged continued dietary and last modifications to continue gradual weight loss

## 2016-10-21 NOTE — Progress Notes (Signed)
   CC: Prediabetes and erectile dysfunction follow-up  HPI:  [name] is a 61 y.o. male with PMHx detailed below presenting for follow-up of several chronic medical conditions. He has no complaints today. He reports he has been in good health and remains active doing yard work at home.   See problem based assessment and plan below for additional details.  Past Medical History:  Diagnosis Date  . ALLERGIC RHINITIS   . Asthma   . Cervical radiculitis   . CHF (congestive heart failure) ([place])   . Chronic systolic heart failure ([place]) 02/28/2015  . GERD (gastroesophageal reflux disease)   . GOUT   . Hyperglycemia 08/28/2014  . HYPERLIPIDEMIA   . HYPERTENSION   . Lateral epicondylitis of right elbow   . Nonischemic cardiomyopathy ([place]) 09/28/2014  . Obesity (BMI 30-39.9) 06/06/2015  . S/P cardiac cath wjith normal coronary arteries  09/28/2014  . Sleep apnea     Review of Systems: Review of Systems  Constitutional: Positive for weight loss (intentional). Negative for chills, diaphoresis, fever and malaise/fatigue.  HENT: Negative for congestion and sore throat.   Respiratory: Negative for cough, sputum production and shortness of breath.   Cardiovascular: Negative for chest pain, palpitations, orthopnea, claudication, leg swelling and PND.  Gastrointestinal: Negative for abdominal pain, constipation, diarrhea, nausea and vomiting.  Musculoskeletal: Negative for joint pain.  Neurological: Negative for dizziness and weakness.  All other systems reviewed and are negative.    Physical Exam: Vitals:   10/21/16 1412  BP: 118/75  Pulse: (!) 59  Temp: 98.1 F (36.7 C)  TempSrc: Oral  SpO2: 99%  Weight: 200 lb 14.4 oz (91.1 kg)   Body mass index is 28.83 kg/m. GENERAL- Well-nourished gentleman sitting comfortably in exam room chair, alert, in no distress, pleasant and conversational HEENT- Atraumatic, PERRL, moist mucous membranes, edentulous, oropharynx clear CARDIAC-  bradycardic rate and regular rhythm, no murmurs, rubs or gallops. RESP- Clear to ascultation bilaterally, no wheezing or crackles, normal work of breathing ABDOMEN- Soft, nontender, nondistended NEURO- Alert and oriented EXTREMITIES- Normal bulk and range of motion, no edema, 2+ peripheral pulses SKIN- Warm, dry, intact, without visible rash PSYCH- positive affect, clear speech, thoughts linear and goal-directed   Assessment & Plan:   See encounters tab for problem based medical decision making.  Patient discussed with Dr. Beryle Beams

## 2016-10-21 NOTE — Patient Instructions (Addendum)
Please continue taking your medications as prescribed.  We are very impressed with the progress you have made with diet and weight loss! Keep up the good work!  Your weight loss has already improved your health as evidenced by your improved knee pain, blood pressure, and sleep apnea symptoms.  We can see you in 5 months for check up visit and to get your flu shot.

## 2016-10-21 NOTE — Assessment & Plan Note (Signed)
Reports he uses CPAP about 50% of the time, although he understands he should use it all the time. Family notices no snoring when he does not use it, nor does he feel tired the day afterward. He has made great progress with weight loss but does have secondary polycythemia likely from his OSA/CHF.  Plan: -- Encouraged 100% compliance with CPAP QHS -- With continued weight loss we may consider a repeat sleep study in the future to assess if he still has OSA

## 2016-10-21 NOTE — Assessment & Plan Note (Signed)
Blood pressure 118/75 today, well controlled. He denies any dizziness, fatigue, chest pain, shortness of breath, orthopnea, peripheral edema. He is consistently following up with Dr. Debara Pickett, his cardiologist.  Plan: -- Continue Carvedilol 25 mg BID, Sacubitril-Valsartan (Entresto) 49-51 mg BID, and Spironolactone 12.5 mg QD

## 2016-10-22 LAB — LIPID PANEL
CHOLESTEROL TOTAL: 134 mg/dL (ref 100–199)
Chol/HDL Ratio: 3.8 ratio (ref 0.0–5.0)
HDL: 35 mg/dL — AB (ref >39)
LDL Calculated: 68 mg/dL (ref 0–99)
Triglycerides: 155 mg/dL — ABNORMAL HIGH (ref 0–149)
VLDL Cholesterol Cal: 31 mg/dL (ref 5–40)

## 2016-10-22 NOTE — Progress Notes (Signed)
Medicine attending: Medical history, presenting problems, physical findings, and medications, reviewed with resident physician Dr Adam Johnson on the day of the patient visit and I concur with his evaluation and management plan. 

## 2016-10-23 ENCOUNTER — Encounter: Payer: BLUE CROSS/BLUE SHIELD | Admitting: Internal Medicine

## 2016-10-23 ENCOUNTER — Encounter: Payer: Self-pay | Admitting: Internal Medicine

## 2016-10-25 ENCOUNTER — Encounter: Payer: Self-pay | Admitting: Internal Medicine

## 2016-10-25 DIAGNOSIS — E785 Hyperlipidemia, unspecified: Secondary | ICD-10-CM

## 2016-10-27 ENCOUNTER — Encounter: Payer: Self-pay | Admitting: Internal Medicine

## 2016-10-27 ENCOUNTER — Other Ambulatory Visit: Payer: Self-pay | Admitting: *Deleted

## 2016-10-27 DIAGNOSIS — E785 Hyperlipidemia, unspecified: Secondary | ICD-10-CM

## 2016-10-27 MED ORDER — PRAVASTATIN SODIUM 40 MG PO TABS: 40.0000 mg | ORAL_TABLET | Freq: Every evening | ORAL | 3 refills | Status: DC

## 2016-11-10 ENCOUNTER — Encounter (HOSPITAL_COMMUNITY): Payer: Self-pay

## 2016-11-10 ENCOUNTER — Emergency Department (HOSPITAL_COMMUNITY)
Admission: EM | Admit: 2016-11-10 | Discharge: 2016-11-10 | Disposition: A | Discharge status: Home / Self Care | Payer: BLUE CROSS/BLUE SHIELD | Attending: Emergency Medicine | Admitting: Emergency Medicine

## 2016-11-10 ENCOUNTER — Emergency Department (HOSPITAL_COMMUNITY): Payer: BLUE CROSS/BLUE SHIELD

## 2016-11-10 DIAGNOSIS — I110 Hypertensive heart disease with heart failure: Secondary | ICD-10-CM | POA: Diagnosis not present

## 2016-11-10 DIAGNOSIS — J45909 Unspecified asthma, uncomplicated: Secondary | ICD-10-CM | POA: Diagnosis not present

## 2016-11-10 DIAGNOSIS — R0789 Other chest pain: Secondary | ICD-10-CM | POA: Diagnosis not present

## 2016-11-10 DIAGNOSIS — Z79899 Other long term (current) drug therapy: Secondary | ICD-10-CM | POA: Diagnosis not present

## 2016-11-10 DIAGNOSIS — R079 Chest pain, unspecified: Secondary | ICD-10-CM | POA: Diagnosis present

## 2016-11-10 DIAGNOSIS — Z7982 Long term (current) use of aspirin: Secondary | ICD-10-CM | POA: Insufficient documentation

## 2016-11-10 DIAGNOSIS — I5022 Chronic systolic (congestive) heart failure: Secondary | ICD-10-CM | POA: Insufficient documentation

## 2016-11-10 LAB — I-STAT TROPONIN, ED
TROPONIN I, POC: 0.01 ng/mL (ref 0.00–0.08)
Troponin i, poc: 0.01 ng/mL (ref 0.00–0.08)

## 2016-11-10 LAB — BRAIN NATRIURETIC PEPTIDE: B NATRIURETIC PEPTIDE 5: 102.9 pg/mL — AB (ref 0.0–100.0)

## 2016-11-10 LAB — BASIC METABOLIC PANEL
Anion gap: 7 (ref 5–15)
BUN: 7 mg/dL (ref 6–20)
CO2: 28 mmol/L (ref 22–32)
CREATININE: 1.05 mg/dL (ref 0.61–1.24)
Calcium: 9.3 mg/dL (ref 8.9–10.3)
Chloride: 104 mmol/L (ref 101–111)
GFR calc Af Amer: >60 mL/min (ref >60)
GLUCOSE: 117 mg/dL — AB (ref 65–99)
POTASSIUM: 3.5 mmol/L (ref 3.5–5.1)
Sodium: 139 mmol/L (ref 135–145)

## 2016-11-10 LAB — CBC
HCT: 48.6 % (ref 39.0–52.0)
Hemoglobin: 16.3 g/dL (ref 13.0–17.0)
MCH: 28.6 pg (ref 26.0–34.0)
MCHC: 33.5 g/dL (ref 30.0–36.0)
MCV: 85.4 fL (ref 78.0–100.0)
Platelets: 102 10*3/uL — ABNORMAL LOW (ref 150–400)
RBC: 5.69 MIL/uL (ref 4.22–5.81)
RDW: 13.7 % (ref 11.5–15.5)
WBC: 5.2 10*3/uL (ref 4.0–10.5)

## 2016-11-10 MED ORDER — NITROGLYCERIN 0.4 MG SL SUBL
0.4000 mg | SUBLINGUAL_TABLET | Freq: Once | SUBLINGUAL | Status: AC
  Administered 2016-11-10: 0.4 mg via SUBLINGUAL
  Filled 2016-11-10: qty 1

## 2016-11-10 MED ORDER — NITROGLYCERIN 0.4 MG SL SUBL: 0.4000 mg | SUBLINGUAL_TABLET | SUBLINGUAL | Status: DC | PRN

## 2016-11-10 MED ORDER — ACETAMINOPHEN 500 MG PO TABS
1000.0000 mg | ORAL_TABLET | Freq: Once | ORAL | Status: AC
  Administered 2016-11-10: 1000 mg via ORAL
  Filled 2016-11-10: qty 2

## 2016-11-10 MED ORDER — ASPIRIN 81 MG PO CHEW: 324.0000 mg | CHEWABLE_TABLET | Freq: Once | ORAL | Status: DC

## 2016-11-10 MED ORDER — IBUPROFEN 400 MG PO TABS: 400.0000 mg | ORAL_TABLET | Freq: Three times a day (TID) | ORAL | 0 refills | Status: AC

## 2016-11-10 NOTE — ED Provider Notes (Signed)
[facility] DEPT Provider Note   CSN: [id number] Arrival date & time: 11/10/16  5400     History   Chief Complaint Chief Complaint  Patient presents with  . Chest Pain    HPI Tahmir Guerrero is a 61 y.o. male.  61yo M w/ PMH including sCHF, non-ischemic cardiomyopathy, asthma, HTN, HLD who p/w chest pain. 2 days ago while at rest he began having intermittent, left sided chest pressure that radiates to back, associated with SOB, worse with exertion, and 1 episode of associated diaphoresis. Currently he reports some mild chest pain running into L shoulder and back. No N/V, fever, cough/cold symptoms, or recent illness. He is compliant with medications and has taken ASA this morning. He reports intentional weight loss recently.    The history is provided by the patient.    Past Medical History:  Diagnosis Date  . ALLERGIC RHINITIS   . Asthma   . Cervical radiculitis   . CHF (congestive heart failure) ([place])   . Chronic systolic heart failure ([place]) 02/28/2015  . GERD (gastroesophageal reflux disease)   . GOUT   . Hyperglycemia 08/28/2014  . HYPERLIPIDEMIA   . HYPERTENSION   . Lateral epicondylitis of right elbow   . Nonischemic cardiomyopathy ([place]) 09/28/2014  . Obesity (BMI 30-39.9) 06/06/2015  . S/P cardiac cath wjith normal coronary arteries  09/28/2014  . Sleep apnea     Patient Active Problem List   Diagnosis Date Noted  . Erectile dysfunction 07/17/2016  . Degenerative arthritis of left knee 03/03/2016  . GERD (gastroesophageal reflux disease) 03/03/2016  . Preventative health care 08/29/2015  . Obesity (BMI 30-39.9) 06/06/2015  . Polycythemia, secondary 05/21/2015  . NICM (nonischemic cardiomyopathy) ([place]) 03/21/2015  . Chronic systolic heart failure ([place]) 02/28/2015  . Excessive daytime sleepiness 12/16/2014  . OSA (obstructive sleep apnea) 12/16/2014  . Nonischemic cardiomyopathy ([place]) 09/28/2014  . S/P cardiac cath wjith normal coronary arteries  09/28/2014    . Erythrocytosis and decreased platelet count. 09/28/2014  . Musculoskeletal chest pain 09/25/2014  . Elevated troponin 09/25/2014  . Prediabetes 08/28/2014  . Cervical radiculitis 01/02/2012  . Hyperlipidemia 07/03/2010  . GOUT 07/01/2009  . Essential hypertension 07/01/2009  . Allergic rhinitis 07/01/2009  . COLONIC POLYPS, HX OF 07/01/2009    Past Surgical History:  Procedure Laterality Date  . COLONOSCOPY    . EP IMPLANTABLE DEVICE N/A 03/21/2015   Procedure: SubQ ICD Implant;  Surgeon: Deboraha Sprang, MD;  Location: [facility];  Service: Cardiovascular;  Laterality: N/A;  . LEFT AND RIGHT HEART CATHETERIZATION WITH CORONARY ANGIOGRAM N/A 09/27/2014   Procedure: LEFT AND RIGHT HEART CATHETERIZATION WITH CORONARY ANGIOGRAM;  Surgeon: Belva Crome, MD;  Location: [facility] CATH LAB;  Service: Cardiovascular;  Laterality: N/A;  . SUBQ ICD  03/21/2015       Home Medications    Prior to Admission medications   Medication Sig Start Date End Date Taking? Authorizing Provider  aspirin 81 MG EC tablet Take 1 tablet (81 mg total) by mouth daily. 01/28/16  Yes Hilty, Nadean Corwin, MD  carvedilol (COREG) 25 MG tablet take 1 tablet by mouth twice a day with meals 08/03/16  Yes Biagio Borg, MD  colchicine 0.6 MG tablet Take 1 tablet (0.6 mg total) by mouth daily. 05/01/16  Yes Biagio Borg, MD  furosemide (LASIX) 40 MG tablet Take 1 tablet (40 mg total) by mouth daily. 10/21/16  Yes Asencion Partridge, MD  Multiple Vitamin (MULTIVITAMIN WITH MINERALS) TABS tablet Take  1 tablet by mouth daily.   Yes [provider]  pantoprazole (PROTONIX) 40 MG tablet Take 1 tablet (40 mg total) by mouth daily. 03/03/16  Yes Biagio Borg, MD  pravastatin (PRAVACHOL) 40 MG tablet Take 1 tablet (40 mg total) by mouth every evening. 10/27/16 10/27/17 Yes Asencion Partridge, MD  sacubitril-valsartan (ENTRESTO) 49-51 MG Take 1 tablet by mouth 2 (two) times daily. 07/27/16  Yes Pixie Casino, MD  spironolactone  (ALDACTONE) 25 MG tablet TAKE 1/2 TABLET BY MOUTH ONCE DAILY 04/09/16  Yes Hilty, Nadean Corwin, MD  ibuprofen (ADVIL,MOTRIN) 400 MG tablet Take 1 tablet (400 mg total) by mouth 3 (three) times daily. 11/10/16 11/14/16  Little, Wenda Overland, MD    Family History Family History  Problem Relation Age of Onset  . Lung cancer Mother   . Diabetes Father   . Heart disease Father   . Heart attack Father 28  . Hypertension Sister   . Aneurysm Sister 70       brain aneurysm  . Healthy Brother   . Healthy Brother   . Healthy Brother   . Colon cancer Neg Hx   . Esophageal cancer Neg Hx   . Rectal cancer Neg Hx   . Stomach cancer Neg Hx     Social History Social History  Substance Use Topics  . Smoking status: Never Smoker  . Smokeless tobacco: Never Used  . Alcohol use No     Allergies   Patient has no known allergies.   Review of Systems Review of Systems All other systems reviewed and are negative except that which was mentioned in HPI  Physical Exam Updated Vital Signs BP (!) 145/80   Pulse 66   Temp 98.1 F (36.7 C) (Oral)   Resp (!) 21   Ht 5\' 10"  (1.778 m)   Wt 86.5 kg (190 lb 9.6 oz)   SpO2 96%   BMI 27.35 kg/m   Physical Exam  Constitutional: He is oriented to person, place, and time. He appears well-developed and well-nourished. No distress.  HENT:  Head: Normocephalic and atraumatic.  Moist mucous membranes  Eyes: Conjunctivae are normal. Pupils are equal, round, and reactive to light.  Neck: Neck supple.  Cardiovascular: Normal rate, regular rhythm and normal heart sounds.   No murmur heard. Pulmonary/Chest: Effort normal and breath sounds normal.  Abdominal: Soft. Bowel sounds are normal. He exhibits no distension. There is no tenderness.  Musculoskeletal: He exhibits no edema.  Neurological: He is alert and oriented to person, place, and time.  Fluent speech  Skin: Skin is warm and dry.  Psychiatric: He has a normal mood and affect. Judgment normal.    Nursing note and vitals reviewed.    ED Treatments / Results  Labs (all labs ordered are listed, but only abnormal results are displayed) Labs Reviewed  BASIC METABOLIC PANEL - Abnormal; Notable for the following:       Result Value   Glucose, Bld 117 (*)    All other components within normal limits  CBC - Abnormal; Notable for the following:    Platelets 102 (*)    All other components within normal limits  BRAIN NATRIURETIC PEPTIDE - Abnormal; Notable for the following:    B Natriuretic Peptide 102.9 (*)    All other components within normal limits  Randolm Idol, ED  Randolm Idol, ED    EKG  EKG Interpretation  Date/Time:  Tuesday Nov 10 2016 07:45:55 EDT Ventricular Rate:  62 PR Interval:  166 QRS Duration: 116 QT Interval:  448 QTC Calculation: 211 R Axis:   47 Text Interpretation:  Normal sinus rhythm Left ventricular hypertrophy with QRS widening and repolarization abnormality Abnormal ECG previous T wave inversions in septal, lateral leads have normalized, similar to older tracing in 2016 Confirmed by Theotis Burrow [phone]) on 11/10/2016 8:08:55 AM       Radiology Dg Chest 2 View  Result Date: 11/10/2016 CLINICAL DATA:  Intermittent left shoulder pain and shortness of breath. EXAM: CHEST  2 VIEW COMPARISON:  04/16/2016 FINDINGS: External defibrillator is again identified. Normal heart size. No pleural effusion or edema. No airspace opacities. The visualized osseous structures are unremarkable. IMPRESSION: 1. No acute cardiopulmonary abnormalities. Electronically Signed   By: Kerby Moors M.D.   On: 11/10/2016 09:47    Procedures Procedures (including critical care time)  Medications Ordered in ED Medications  nitroGLYCERIN (NITROSTAT) SL tablet 0.4 mg (0.4 mg Sublingual Given 11/10/16 0855)  acetaminophen (TYLENOL) tablet 1,000 mg (1,000 mg Oral Given 11/10/16 1027)     Initial Impression / Assessment and Plan / ED Course  I have reviewed the  triage vital signs and the nursing notes.  Pertinent labs & imaging results that were available during my care of the patient were reviewed by me and considered in my medical decision making (see chart for details).    PT w/ h/o CHF following with cardiology who p/w 2 days intermittent CP associated with SOB. He was Well-appearing and comfortable on exam with reassuring vital signs. EKG similar to older tracing in 2016 with no new ischemic changes. He had already had aspirin today. Gave one nitroglycerin with mild improvement in his pain, repeated a second dose which caused headache for which I gave him Tylenol. Lab work shows negative serial troponins, unremarkable BMP and CBC, BNP 102. Chest x-ray negative acute. I discussed findings with cardiology, who evaluated the patient in the ED. Patient was seen by Dr. Debara Pickett who recommended discharge home given that his symptoms have been ongoing and serial troponins have been negative. They recommended short course of NSAIDs in the event that this could be musculoskeletal etiology. I recommended follow-up in the cardiology clinic and extensively reviewed return precautions with the patient. He voiced understanding and felt comfortable with this plan. Patient discharged in satisfactory condition.  Final Clinical Impressions(s) / ED Diagnoses   Final diagnoses:  Atypical chest pain    New Prescriptions Discharge Medication List as of 11/10/2016 12:44 PM    START taking these medications   Details  ibuprofen (ADVIL,MOTRIN) 400 MG tablet Take 1 tablet (400 mg total) by mouth 3 (three) times daily., Starting Tue 11/10/2016, Until Sat 11/14/2016, Print         Little, Wenda Overland, MD 11/11/16 [phone]

## 2016-11-10 NOTE — Consult Note (Signed)
Patient ID: Matthew Guerrero MRN: [id number], DOB/AGE: May 24, 1956   Admit date: 11/10/2016   Primary Physician: Asencion Partridge, MD Primary Cardiologist: Dr. Debara Pickett / Dr. Caryl Comes (EP) / Dr. Radford Pax (Matthew) Reason for admission:  Chest pain  HPI:  Matthew Guerrero is a 61 y.o. male with a history of Guerrero, Matthew Guerrero, Matthew Guerrero, Matthew Guerrero, Matthew Matthew NICM (EF 15%) s/p BSX ICD (02/2015), Matthew Guerrero who presented to [facility] ED on 11/10/16 with chest pain.    Admitted 4/5-09/28/14 with acute systolic HF.EF was noted to be 15-20% on echo. Troponin was minimally elevated (0.14).He was diuresed. LHC was arranged Matthew demonstrated normal coronary arteries. He was dx with NICM Matthew felt to be most likely from longstanding Matthew Guerrero. Medications were adjusted for Guerrero Matthew he was placed on Entresto.   His EF remained quite low despite optimal medical therapy Matthew he was seen by Dr. Caryl Comes Matthew a BSX S-ICD was implanted on 03/21/15 for primary prevention.   He was last seen by Dr. Debara Pickett in the office on 07/27/16 for follow up Matthew doing quite well. He was down to 230-->217 lbs.   He was in his usual state of health until Sunday night (11/08/16) when he started noticing left sided stabbing chest pain that radiates to his back as well as left shoulder pain that feels like a grabbing. This is associated with worsening SOB Matthew diaphoresis. The discomfort comes on at rest, usually lasts about 15-20 minutes Matthew then resolves. Not related to exertion. No LE edema, orthopnea or PND. No palpitations. The other day he got up off the couch to walk to the kitchen Matthew felt very dizzy Matthew pre-syncopal, but did not pas out. He is complaint with all of his medications. He has been loosing weight by watching his diet. He stays very active working in his garden Matthew walking the dog.    Problem List  Past Medical History:  Diagnosis Date  . ALLERGIC RHINITIS   . Asthma   . Cervical radiculitis   . Guerrero (congestive heart failure) ([place])     . Chronic systolic heart failure ([place]) 02/28/2015  . GERD (gastroesophageal reflux disease)   . Matthew   . Hyperglycemia 08/28/2014  . HYPERLIPIDEMIA   . HYPERTENSION   . Lateral epicondylitis of right elbow   . Nonischemic cardiomyopathy ([place]) 09/28/2014  . Guerrero (BMI 30-39.9) 06/06/2015  . S/P cardiac cath wjith normal coronary arteries  09/28/2014  . Sleep apnea     Past Surgical History:  Procedure Laterality Date  . COLONOSCOPY    . EP IMPLANTABLE DEVICE N/A 03/21/2015   Procedure: SubQ ICD Implant;  Surgeon: Deboraha Sprang, MD;  Location: [place] [facility];  Service: Cardiovascular;  Laterality: N/A;  . LEFT Matthew RIGHT HEART CATHETERIZATION WITH CORONARY ANGIOGRAM N/A 09/27/2014   Procedure: LEFT Matthew RIGHT HEART CATHETERIZATION WITH CORONARY ANGIOGRAM;  Surgeon: Belva Crome, MD;  Location: [facility] CATH LAB;  Service: Cardiovascular;  Laterality: N/A;  . SUBQ ICD  03/21/2015     Allergies  No Known Allergies   Home Medications  Prior to Admission medications   Medication Sig Start Date End Date Taking? Authorizing Provider  aspirin 81 MG EC tablet Take 1 tablet (81 mg total) by mouth daily. 01/28/16  Yes Hilty, Nadean Corwin, MD  carvedilol (COREG) 25 MG tablet take 1 tablet by mouth twice a day with meals 08/03/16  Yes Biagio Borg, MD  colchicine 0.6 MG tablet Take 1 tablet (  0.6 mg total) by mouth daily. 05/01/16  Yes Biagio Borg, MD  furosemide (LASIX) 40 MG tablet Take 1 tablet (40 mg total) by mouth daily. 10/21/16  Yes Asencion Partridge, MD  Multiple Vitamin (MULTIVITAMIN WITH MINERALS) TABS tablet Take 1 tablet by mouth daily.   Yes [provider]  pantoprazole (PROTONIX) 40 MG tablet Take 1 tablet (40 mg total) by mouth daily. 03/03/16  Yes Biagio Borg, MD  pravastatin (PRAVACHOL) 40 MG tablet Take 1 tablet (40 mg total) by mouth every evening. 10/27/16 10/27/17 Yes Asencion Partridge, MD  sacubitril-valsartan (ENTRESTO) 49-51 MG Take 1 tablet by mouth 2 (two) times daily. 07/27/16  Yes  Pixie Casino, MD  spironolactone (ALDACTONE) 25 MG tablet TAKE 1/2 TABLET BY MOUTH ONCE DAILY 04/09/16  Yes Hilty, Nadean Corwin, MD    Family History  Family History  Problem Relation Age of Onset  . Lung cancer Mother   . Diabetes Father   . Heart disease Father   . Heart attack Father 29  . Hypertension Sister   . Aneurysm Sister 39       brain aneurysm  . Healthy Brother   . Healthy Brother   . Healthy Brother   . Colon cancer Neg Hx   . Esophageal cancer Neg Hx   . Rectal cancer Neg Hx   . Stomach cancer Neg Hx        Family Status  Relation Status  . Mother Deceased  . Father Alive  . Sister Alive  . Sister Deceased  . Sister Alive  . Brother Alive  . Brother Alive  . Brother Alive  . Neg Hx (Not Specified)     Social History  Social History   Social History  . Marital status: Single    Spouse name: N/A  . Number of children: N/A  . Years of education: N/A   Occupational History  . Not on file.   Social History Main Topics  . Smoking status: Never Smoker  . Smokeless tobacco: Never Used  . Alcohol use No  . Drug use: No  . Sexual activity: Not on file   Other Topics Concern  . Not on file   Social History Narrative  . No narrative on file     Review of Systems General:  No chills, fever, night sweats or weight changes.  Cardiovascular: +++ chest pain, +++ dyspnea on exertion, NO edema, orthopnea, palpitations, paroxysmal nocturnal dyspnea. Dermatological: No rash, lesions/masses Respiratory: No cough, dyspnea Urologic: No hematuria, dysuria Abdominal:   No nausea, vomiting, diarrhea, bright red blood per rectum, melena, or hematemesis Neurologic:  No visual changes, wkns, changes in mental status. All other systems reviewed Matthew are otherwise negative except as noted above.  Physical Exam  Blood pressure 121/78, pulse (!) 56, temperature 98.1 F (36.7 C), temperature source Oral, resp. rate 18, height 5\' 10"  (1.778 m), weight 190 lb  9.6 oz (86.5 kg), SpO2 96 %.  General: Pleasant, NAD Psych: Normal affect. Neuro: Alert Matthew oriented X 3. Moves all extremities spontaneously. HEENT: Normal  Neck: Supple without bruits or JVD. Lungs:  Resp regular Matthew unlabored, CTA. Heart: RRR no s3, s4, or murmurs. Abdomen: Soft, non-tender, non-distended, BS + x 4.  Extremities: No clubbing, cyanosis or edema. DP/PT/Radials 2+ Matthew equal bilaterally.  Labs  No results for input(s): CKTOTAL, CKMB, TROPONINI in the last 72 hours. Lab Results  Component Value Date   WBC 5.2 11/10/2016   HGB 16.3 11/10/2016  HCT 48.6 11/10/2016   MCV 85.4 11/10/2016   PLT 102 (L) 11/10/2016    Recent Labs Lab 11/10/16 0812  NA 139  K 3.5  CL 104  CO2 28  BUN 7  CREATININE 1.05  CALCIUM 9.3  GLUCOSE 117*   Lab Results  Component Value Date   CHOL 134 10/21/2016   HDL 35 (L) 10/21/2016   LDLCALC 68 10/21/2016   TRIG 155 (H) 10/21/2016   No results found for: DDIMER   Radiology/Studies  Dg Chest 2 View  Result Date: 11/10/2016 CLINICAL DATA:  Intermittent left shoulder pain Matthew shortness of breath. EXAM: CHEST  2 VIEW COMPARISON:  04/16/2016 FINDINGS: External defibrillator is again identified. Normal heart size. No pleural effusion or edema. No airspace opacities. The visualized osseous structures are unremarkable. IMPRESSION: 1. No acute cardiopulmonary abnormalities. Electronically Signed   By: Kerby Moors M.D.   On: 11/10/2016 09:47   Echo 09/27/14 - Mild LVH. EF 15% to 20%. Diffuse hypokinesis. There is akinesis of the entireanteroseptal myocardium. Grade 1 diastolic dysfunction. There is muscular trabeculation at apex of left ventricle (no obvious thrombus identified). Definity contrast used. - Mitral valve: There was mild regurgitation. - Left atrium: The atrium was moderately dilated. - Right ventricle: The cavity size was moderately dilated. Wall thickness was normal. - Right atrium: The atrium was mildly  dilated.  LHC 09/27/14 The left main coronary artery is normal. The left anterior descending artery is normal. The left circumflex artery is normal. The right coronary artery is dominant Matthew normal. EF: 15%.   ECG Sinus with LVH with repol abnormality   ASSESSMENT Matthew PLAN Shedrick Sarli is a 60 y.o. male with a history of Guerrero, Matthew Guerrero, Matthew Guerrero, Matthew Guerrero, Matthew Matthew NICM (EF 15%) s/p BSX ICD (08/2990), chronic systolic Guerrero who presented to [facility] ED on 11/10/16 with chest pain.   Chest pain: atypical with no objective signs of ischemia. He had normal coronary arteries on cath in 09/2014. Sounds MSK. Plan will be for delta troponin Matthew if negative, plan to go home with anti-inflammatories,  NICM: EF 15 %. He has an ICD in place for primary prevention. Recent interrogation of ICD showed normal lead parameters Matthew good battery life  Chronic systolic Guerrero: appears euvolemic. Continue medical regimen of lasix 40mg  daily, Coreg 25mg  BID, Entresto 49-51mg  daily Matthew spiro 25mg  daily.   Matthew Guerrero: BP well controlled   Matthew Guerrero: continue statin   Matthew: continue Guerrero, followed by Dr. Radford Pax   Signed, Angelena Form, PA-C 11/10/2016, 10:34 AM  Pager [phone]

## 2016-11-10 NOTE — ED Triage Notes (Signed)
Pt reports left side chest pain X2 days. Some radiation to the back as well as associated shortness of breath. No acute distress noted.

## 2016-11-18 ENCOUNTER — Encounter: Payer: Self-pay | Admitting: Cardiology

## 2016-11-30 ENCOUNTER — Encounter: Payer: Self-pay | Admitting: *Deleted

## 2016-12-03 ENCOUNTER — Encounter: Payer: Self-pay | Admitting: Cardiology

## 2016-12-07 NOTE — Progress Notes (Signed)
Cardiology Office Note    Date:  12/08/2016   ID:  Matthew Guerrero, DOB 1956/02/16, MRN [id number]  PCP:  Asencion Partridge, MD  Cardiologist: Lyman Bishop, MD   Chief Complaint  Patient presents with  . Sleep Apnea    History of Present Illness:  Matthew Guerrero is a 61 y.o. male with a history of moderate OSA with an AHI of 23/hr and mild to moderate snoring and associated nocturnal hypoxemia with his respiratory events as low as 71%. He is on 9 cm H2O and is doing well with his device.he says that he had stop using it for a while because he had a roommate that was hospitalized and he did not use it but recently started using it again.   He tolerates his nasal pillow mask and feels the pressure is adequate. He denies any dryness of his mouth or nose.   He denies any daytime sleepiness and continues to feel rested in the am. He does not snore.    Past Medical History:  Diagnosis Date  . ALLERGIC RHINITIS   . Asthma   . Cervical radiculitis   . CHF (congestive heart failure) ([place])   . Chronic systolic heart failure ([place]) 02/28/2015  . GERD (gastroesophageal reflux disease)   . GOUT   . Hyperglycemia 08/28/2014  . HYPERLIPIDEMIA   . HYPERTENSION   . Lateral epicondylitis of right elbow   . Nonischemic cardiomyopathy ([place]) 09/28/2014  . Obesity (BMI 30-39.9) 06/06/2015  . S/P cardiac cath wjith normal coronary arteries  09/28/2014  . Sleep apnea     Past Surgical History:  Procedure Laterality Date  . COLONOSCOPY    . EP IMPLANTABLE DEVICE N/A 03/21/2015   Procedure: SubQ ICD Implant;  Surgeon: Deboraha Sprang, MD;  Location: [place] CV LAB;  Service: Cardiovascular;  Laterality: N/A;  . LEFT AND RIGHT HEART CATHETERIZATION WITH CORONARY ANGIOGRAM N/A 09/27/2014   Procedure: LEFT AND RIGHT HEART CATHETERIZATION WITH CORONARY ANGIOGRAM;  Surgeon: Belva Crome, MD;  Location: [facility] CATH LAB;  Service: Cardiovascular;  Laterality: N/A;  . SUBQ ICD  03/21/2015    Current  Medications: Current Meds  Medication Sig  . aspirin 81 MG EC tablet Take 1 tablet (81 mg total) by mouth daily.  . carvedilol (COREG) 25 MG tablet take 1 tablet by mouth twice a day with meals  . colchicine 0.6 MG tablet Take 1 tablet (0.6 mg total) by mouth daily.  . furosemide (LASIX) 40 MG tablet Take 1 tablet (40 mg total) by mouth daily.  . Multiple Vitamin (MULTIVITAMIN WITH MINERALS) TABS tablet Take 1 tablet by mouth daily.  . pantoprazole (PROTONIX) 40 MG tablet Take 1 tablet (40 mg total) by mouth daily.  . pravastatin (PRAVACHOL) 40 MG tablet Take 1 tablet (40 mg total) by mouth every evening.  . sacubitril-valsartan (ENTRESTO) 49-51 MG Take 1 tablet by mouth 2 (two) times daily.  [name] spironolactone (ALDACTONE) 25 MG tablet TAKE 1/2 TABLET BY MOUTH ONCE DAILY    Allergies:   Patient has no known allergies.   Social History   Social History  . Marital status: Single    Spouse name: N/A  . Number of children: N/A  . Years of education: N/A   Social History Main Topics  . Smoking status: Never Smoker  . Smokeless tobacco: Never Used  . Alcohol use No  . Drug use: No  . Sexual activity: Not Asked   Other Topics Concern  . None   Social  History Narrative  . None     Family History:  The patient's family history includes Aneurysm (age of onset: 54) in his sister; Diabetes in his father; Healthy in his brother, brother, and brother; Heart attack (age of onset: 101) in his father; Heart disease in his father; Hypertension in his sister; Lung cancer in his mother.   ROS:   Please see the history of present illness.    ROS All other systems reviewed and are negative.  No flowsheet data found.     PHYSICAL EXAM:   VS:  BP 110/70   Pulse 82   Ht 5\' 10"  (1.778 m)   Wt 193 lb 12.8 oz (87.9 kg)   SpO2 95%   BMI 27.81 kg/m    GEN: Well nourished, well developed, in no acute distress  HEENT: normal  Neck: no JVD, carotid bruits, or masses Cardiac: RRR; no murmurs,  rubs, or gallops,no edema.  Intact distal pulses bilaterally.  Respiratory:  clear to auscultation bilaterally, normal work of breathing GI: soft, nontender, nondistended, + BS MS: no deformity or atrophy  Skin: warm and dry, no rash Neuro:  Alert and Oriented x 3, Strength and sensation are intact Psych: euthymic mood, full affect  Wt Readings from Last 3 Encounters:  12/08/16 193 lb 12.8 oz (87.9 kg)  11/10/16 190 lb 9.6 oz (86.5 kg)  10/21/16 200 lb 14.4 oz (91.1 kg)      Studies/Labs Reviewed:   EKG:  EKG is not ordered today.    Recent Labs: 11/10/2016: B Natriuretic Peptide 102.9; BUN 7; Creatinine, Ser 1.05; Hemoglobin 16.3; Platelets 102; Potassium 3.5; Sodium 139   Lipid Panel    Component Value Date/Time   CHOL 134 10/21/2016 1438   TRIG 155 (H) 10/21/2016 1438   HDL 35 (L) 10/21/2016 1438   CHOLHDL 3.8 10/21/2016 1438   CHOLHDL 5 08/29/2015 0942   VLDL 37.0 08/29/2015 0942   LDLCALC 68 10/21/2016 1438   LDLDIRECT 105.8 06/18/2010 0806    Additional studies/ records that were reviewed today include:  CPAP download    ASSESSMENT:    1. OSA (obstructive sleep apnea)   2. Essential hypertension   3. Obesity (BMI 30-39.9)      PLAN:  In order of problems listed above:  OSA - the patient is tolerating PAP therapy well without any problems. The PAP download was reviewed today and showed an AHI of 1.4/hr on 9 cm H2O with 30% compliance in using more than 4 hours nightly.  The patient has been using and benefiting from CPAP use and will continue to benefit from therapy. His compliance fell off some recently when his roommate was in the hospital but he is back using it recently.  HTN - His BP is well controlled on exam today. He will continue on aldactone, entresto and  carvedilol Obesity - He tries to stay active at home.  He has lost 7 pounds.  I encouraged him to walk some for exercise.     Medication Adjustments/Labs and Tests Ordered: Current medicines  are reviewed at length with the patient today.  Concerns regarding medicines are outlined above.  Medication changes, Labs and Tests ordered today are listed in the Patient Instructions below.  There are no Patient Instructions on file for this visit.   Signed, Fransico Him, MD  12/08/2016 9:36 AM    [facility] [address], [place], [place]  [address] Phone: [phone]; Fax: [phone]

## 2016-12-08 ENCOUNTER — Ambulatory Visit (INDEPENDENT_AMBULATORY_CARE_PROVIDER_SITE_OTHER): Payer: Medicaid Other | Admitting: Cardiology

## 2016-12-08 ENCOUNTER — Encounter: Payer: Self-pay | Admitting: Cardiology

## 2016-12-08 VITALS — BP 110/70 | HR 82 | Ht 70.0 in | Wt 193.8 lb

## 2016-12-08 DIAGNOSIS — I10 Essential (primary) hypertension: Secondary | ICD-10-CM | POA: Diagnosis not present

## 2016-12-08 DIAGNOSIS — E669 Obesity, unspecified: Secondary | ICD-10-CM

## 2016-12-08 DIAGNOSIS — G4733 Obstructive sleep apnea (adult) (pediatric): Secondary | ICD-10-CM

## 2016-12-08 NOTE — Patient Instructions (Signed)

## 2016-12-10 ENCOUNTER — Emergency Department (HOSPITAL_COMMUNITY)
Admission: EM | Admit: 2016-12-10 | Discharge: 2016-12-10 | Disposition: A | Discharge status: Home / Self Care | Payer: Medicaid Other | Attending: Emergency Medicine | Admitting: Emergency Medicine

## 2016-12-10 ENCOUNTER — Emergency Department (HOSPITAL_COMMUNITY): Payer: Medicaid Other

## 2016-12-10 ENCOUNTER — Encounter (HOSPITAL_COMMUNITY): Payer: Self-pay | Admitting: Emergency Medicine

## 2016-12-10 ENCOUNTER — Telehealth: Payer: Self-pay

## 2016-12-10 DIAGNOSIS — M79671 Pain in right foot: Secondary | ICD-10-CM

## 2016-12-10 DIAGNOSIS — Y999 Unspecified external cause status: Secondary | ICD-10-CM | POA: Diagnosis not present

## 2016-12-10 DIAGNOSIS — I10 Essential (primary) hypertension: Secondary | ICD-10-CM | POA: Insufficient documentation

## 2016-12-10 DIAGNOSIS — Y9301 Activity, walking, marching and hiking: Secondary | ICD-10-CM | POA: Insufficient documentation

## 2016-12-10 DIAGNOSIS — W101XXA Fall (on)(from) sidewalk curb, initial encounter: Secondary | ICD-10-CM | POA: Diagnosis not present

## 2016-12-10 DIAGNOSIS — Z7982 Long term (current) use of aspirin: Secondary | ICD-10-CM | POA: Insufficient documentation

## 2016-12-10 DIAGNOSIS — J45909 Unspecified asthma, uncomplicated: Secondary | ICD-10-CM | POA: Diagnosis not present

## 2016-12-10 DIAGNOSIS — Y92410 Unspecified street and highway as the place of occurrence of the external cause: Secondary | ICD-10-CM | POA: Insufficient documentation

## 2016-12-10 DIAGNOSIS — S99921A Unspecified injury of right foot, initial encounter: Secondary | ICD-10-CM | POA: Diagnosis present

## 2016-12-10 DIAGNOSIS — I5022 Chronic systolic (congestive) heart failure: Secondary | ICD-10-CM | POA: Insufficient documentation

## 2016-12-10 DIAGNOSIS — S9031XA Contusion of right foot, initial encounter: Secondary | ICD-10-CM

## 2016-12-10 DIAGNOSIS — Z79899 Other long term (current) drug therapy: Secondary | ICD-10-CM | POA: Diagnosis not present

## 2016-12-10 NOTE — Telephone Encounter (Signed)
Sounds like a strange encounter. I don't see a chronic pain diagnosis in his chart. History of getting Tramadol, but not since 2017. I agree we would need to evaluate him in office for new pain. However, it was inappropriate for him to raise his voice when talking with you.

## 2016-12-10 NOTE — Discharge Instructions (Signed)
If you can, please take Ibuprofen (Advil, motrin) and Tylenol (acetaminophen) to relieve your pain.  You may take up to 800 MG (4 pills) of normal strength ibuprofen every 8 hours as needed.  In between doses of ibuprofen you make take tylenol, up to 1,000 mg (two extra strength pills).  Do not take more than 3,000 mg tylenol in a 24 hour period.  Please check all medication labels as many medications such as pain and cold medications may contain tylenol.  Do not drink alcohol while taking these medications.  Do not take other NSAID'S while taking ibuprofen (such as aleve or naproxen).  Please take ibuprofen with food to decrease stomach upset.  If your symptoms do not improve over the weekend please follow up with your primary care doctor.    Nothing appears obviously broken on your x-rays.

## 2016-12-10 NOTE — ED Triage Notes (Addendum)
Last week he stepped off curb and R foot slipped; he caught his balance and has had foot pain since. Saturday night he had a liter soda fall onto his foot. He has been reporting pain since. Is able to ambulate on it.

## 2016-12-10 NOTE — Telephone Encounter (Signed)
Would like something for pain. Please call pt back.

## 2016-12-10 NOTE — ED Notes (Signed)
ED Provider at bedside. 

## 2016-12-10 NOTE — Telephone Encounter (Signed)
rtc to pt, he demanded pain medicine, stated he went to ED and all they told him was to use tylenol and the like, he states that the nurse just needs to tell dr Wynetta Emery to call some pain medcine in, he was informed he would need to be seen in the office, he said no, just call it in, he was ask agin to please come to clinic this pm and stated in a raised voice well ill tell you what im going to do, im going to find a new doctor, he was told he may at his choosing and to please inform [facility] when he does so [facility] may send his medical records to his new pcp

## 2016-12-11 NOTE — ED Provider Notes (Signed)
[facility] DEPT Provider Note   CSN: [id number] Arrival date & time: 12/10/16  1253     History   Chief Complaint Chief Complaint  Patient presents with  . Foot Pain    HPI Matthew Guerrero is a 61 y.o. male with right foot pain.  He reports that he stepped off a curb last week causing his right foot to slip and he has had consistent foot pain since.  On Saturday night he dropped a 2liter soda bottle on the right foot which increased his pain.  He is concerned his foot may be broken.  His pain is worsened with ambulation, better with rest.    HPI  Past Medical History:  Diagnosis Date  . ALLERGIC RHINITIS   . Asthma   . Cervical radiculitis   . CHF (congestive heart failure) ([place])   . Chronic systolic heart failure ([place]) 02/28/2015  . GERD (gastroesophageal reflux disease)   . GOUT   . Hyperglycemia 08/28/2014  . HYPERLIPIDEMIA   . HYPERTENSION   . Lateral epicondylitis of right elbow   . Nonischemic cardiomyopathy ([place]) 09/28/2014  . Obesity (BMI 30-39.9) 06/06/2015  . S/P cardiac cath wjith normal coronary arteries  09/28/2014  . Sleep apnea     Patient Active Problem List   Diagnosis Date Noted  . Erectile dysfunction 07/17/2016  . Degenerative arthritis of left knee 03/03/2016  . GERD (gastroesophageal reflux disease) 03/03/2016  . Preventative health care 08/29/2015  . Obesity (BMI 30-39.9) 06/06/2015  . Polycythemia, secondary 05/21/2015  . NICM (nonischemic cardiomyopathy) ([place]) 03/21/2015  . Chronic systolic heart failure ([place]) 02/28/2015  . Excessive daytime sleepiness 12/16/2014  . OSA (obstructive sleep apnea) 12/16/2014  . Nonischemic cardiomyopathy ([place]) 09/28/2014  . S/P cardiac cath wjith normal coronary arteries  09/28/2014  . Erythrocytosis and decreased platelet count. 09/28/2014  . Musculoskeletal chest pain 09/25/2014  . Elevated troponin 09/25/2014  . Prediabetes 08/28/2014  . Cervical radiculitis 01/02/2012  . Hyperlipidemia 07/03/2010  .  GOUT 07/01/2009  . Essential hypertension 07/01/2009  . Allergic rhinitis 07/01/2009  . COLONIC POLYPS, HX OF 07/01/2009    Past Surgical History:  Procedure Laterality Date  . COLONOSCOPY    . EP IMPLANTABLE DEVICE N/A 03/21/2015   Procedure: SubQ ICD Implant;  Surgeon: Deboraha Sprang, MD;  Location: [facility];  Service: Cardiovascular;  Laterality: N/A;  . LEFT AND RIGHT HEART CATHETERIZATION WITH CORONARY ANGIOGRAM N/A 09/27/2014   Procedure: LEFT AND RIGHT HEART CATHETERIZATION WITH CORONARY ANGIOGRAM;  Surgeon: Belva Crome, MD;  Location: [facility] CATH LAB;  Service: Cardiovascular;  Laterality: N/A;  . SUBQ ICD  03/21/2015       Home Medications    Prior to Admission medications   Medication Sig Start Date End Date Taking? Authorizing Provider  aspirin 81 MG EC tablet Take 1 tablet (81 mg total) by mouth daily. 01/28/16   Pixie Casino, MD  carvedilol (COREG) 25 MG tablet take 1 tablet by mouth twice a day with meals 08/03/16   Biagio Borg, MD  colchicine 0.6 MG tablet Take 1 tablet (0.6 mg total) by mouth daily. 05/01/16   Biagio Borg, MD  furosemide (LASIX) 40 MG tablet Take 1 tablet (40 mg total) by mouth daily. 10/21/16   Asencion Partridge, MD  Multiple Vitamin (MULTIVITAMIN WITH MINERALS) TABS tablet Take 1 tablet by mouth daily.    [provider]  pantoprazole (PROTONIX) 40 MG tablet Take 1 tablet (40 mg total) by mouth daily. 03/03/16  Biagio Borg, MD  pravastatin (PRAVACHOL) 40 MG tablet Take 1 tablet (40 mg total) by mouth every evening. 10/27/16 10/27/17  Asencion Partridge, MD  sacubitril-valsartan (ENTRESTO) 49-51 MG Take 1 tablet by mouth 2 (two) times daily. 07/27/16   Pixie Casino, MD  spironolactone (ALDACTONE) 25 MG tablet TAKE 1/2 TABLET BY MOUTH ONCE DAILY 04/09/16   Hilty, Nadean Corwin, MD    Family History Family History  Problem Relation Age of Onset  . Lung cancer Mother   . Diabetes Father   . Heart disease Father   . Heart attack Father 51  .  Hypertension Sister   . Aneurysm Sister 37       brain aneurysm  . Healthy Brother   . Healthy Brother   . Healthy Brother   . Colon cancer Neg Hx   . Esophageal cancer Neg Hx   . Rectal cancer Neg Hx   . Stomach cancer Neg Hx     Social History Social History  Substance Use Topics  . Smoking status: Never Smoker  . Smokeless tobacco: Never Used  . Alcohol use No     Allergies   Patient has no known allergies.   Review of Systems Review of Systems  Musculoskeletal: Negative for arthralgias, joint swelling and myalgias.  Skin: Negative for color change, pallor and wound.     Physical Exam Updated Vital Signs BP 114/77   Pulse 88   Temp 97.9 F (36.6 C) (Oral)   Resp 16   SpO2 99%   Physical Exam  Constitutional: He appears well-developed and well-nourished.  Musculoskeletal:  Right foot is mildly tender to palpation over first MTP joint.  No obvious swelling redness, abnormal warmth or deformity.  Right foot and toes are warm, well perfused. No tenderness to right ankle, medial/lateral malleolus, proximal fibula or tibia.   Neurological: He is alert.  Sensation intact to bilateral feet  Skin: Skin is warm and dry. He is not diaphoretic. No pallor.  Psychiatric: He has a normal mood and affect. His behavior is normal.  Nursing note and vitals reviewed.    ED Treatments / Results  Labs (all labs ordered are listed, but only abnormal results are displayed) Labs Reviewed - No data to display  EKG  EKG Interpretation None       Radiology Dg Foot Complete Right  Result Date: 12/10/2016 CLINICAL DATA:  Pt c/o lateral right foot pain after he stepped on a curb on Thursday and then dropped a 2-liter soda bottle on his right foot on Saturday. No hx of prior injuries or surgeries to the area. EXAM: RIGHT FOOT COMPLETE - 3+ VIEW COMPARISON:  None. FINDINGS: There is no evidence of fracture or dislocation. Plantar and Achilles calcaneal spurs are present. Soft  tissues are unremarkable. IMPRESSION: No evidence for acute  abnormality. Electronically Signed   By: Nolon Nations M.D.   On: 12/10/2016 13:31    Procedures Procedures (including critical care time)  Medications Ordered in ED Medications - No data to display   Initial Impression / Assessment and Plan / ED Course  I have reviewed the triage vital signs and the nursing notes.  Pertinent labs & imaging results that were available during my care of the patient were reviewed by me and considered in my medical decision making (see chart for details).    Matthew Guerrero presents with right foot pain consistent with a contusion.  Radiology with out acute abnormality.  Based on location of pain as  first MTP joint may be more consistent with gout and independent of trauma.  Patient reports he has not missed any allopurinol doses. Patient instructed on OTC pain management and conservative measures.  Instructed to follow up with PCP or podiatry if pain continues. Patient was given the option to ask questions, all of which were answered to the best of my ability. Patient agreeable for discharge.  Final Clinical Impressions(s) / ED Diagnoses   Final diagnoses:  Contusion of right foot, initial encounter  Foot pain, right    New Prescriptions Discharge Medication List as of 12/10/2016  2:24 PM       Lorin Glass, PA-C 12/11/16 2105    Lajean Saver, MD 12/14/16 1510

## 2016-12-14 ENCOUNTER — Ambulatory Visit (INDEPENDENT_AMBULATORY_CARE_PROVIDER_SITE_OTHER): Payer: Medicaid Other | Admitting: Internal Medicine

## 2016-12-14 VITALS — BP 133/84 | HR 64 | Temp 98.1°F | Ht 70.0 in | Wt 194.9 lb

## 2016-12-14 DIAGNOSIS — W208XXD Other cause of strike by thrown, projected or falling object, subsequent encounter: Secondary | ICD-10-CM

## 2016-12-14 DIAGNOSIS — S90111A Contusion of right great toe without damage to nail, initial encounter: Secondary | ICD-10-CM | POA: Insufficient documentation

## 2016-12-14 DIAGNOSIS — S90111D Contusion of right great toe without damage to nail, subsequent encounter: Secondary | ICD-10-CM

## 2016-12-14 MED ORDER — TRAMADOL HCL 50 MG PO TABS: 50.0000 mg | ORAL_TABLET | Freq: Four times a day (QID) | ORAL | 0 refills | Status: DC | PRN

## 2016-12-14 MED ORDER — NAPROXEN 500 MG PO TABS: 500.0000 mg | ORAL_TABLET | Freq: Two times a day (BID) | ORAL | 0 refills | Status: DC

## 2016-12-14 NOTE — Progress Notes (Signed)
   CC: Right foot swelling  HPI:  [name] is a 61 y.o. man who is here for follow up of right foot pain for which he was seen at the Emergency Department 4 days ago.   See problem based assessment and plan below for additional details  Past Medical History:  Diagnosis Date  . ALLERGIC RHINITIS   . Asthma   . Cervical radiculitis   . CHF (congestive heart failure) ([place])   . Chronic systolic heart failure ([place]) 02/28/2015  . GERD (gastroesophageal reflux disease)   . GOUT   . Hyperglycemia 08/28/2014  . HYPERLIPIDEMIA   . HYPERTENSION   . Lateral epicondylitis of right elbow   . Nonischemic cardiomyopathy ([place]) 09/28/2014  . Obesity (BMI 30-39.9) 06/06/2015  . S/P cardiac cath wjith normal coronary arteries  09/28/2014  . Sleep apnea     Review of Systems:  Review of Systems  Cardiovascular: Negative for leg swelling.  Musculoskeletal: Positive for joint pain. Negative for falls.  Neurological: Negative for dizziness, sensory change and focal weakness.  Endo/Heme/Allergies: Does not bruise/bleed easily.    Physical Exam: Physical Exam  Constitutional: He is well-developed, well-nourished, and in no distress.  Cardiovascular: Normal rate and regular rhythm.   Musculoskeletal: He exhibits edema and tenderness. He exhibits no deformity.  Nonpitting edema around the 1st right MTP joint extending about 4cm proximally, tender to passive ROM in all directions, no overlying erythema, no joint laxity or deformity  Neurological:  Sensation intact to tip of toe  Skin: Skin is warm and dry. No rash noted.    Vitals:   12/14/16 1454  BP: 133/84  Pulse: 64  Temp: 98.1 F (36.7 C)  TempSrc: Oral  SpO2: 99%  Weight: 194 lb 14.4 oz (88.4 kg)  Height: 5\' 10"  (1.778 m)    Assessment & Plan:   See Encounters Tab for problem based charting.  Patient discussed with Dr. Daryll Drown

## 2016-12-14 NOTE — Patient Instructions (Signed)
It was a pleasure to see you today Matthew Guerrero.  You have an injury of the soft parts of the foot and toe joint. These can take up to several weeks to fully heal but generally do so completely. In the meantime we can try several things to relieve your pain and swelling  I recommend taking an antiinflammatory medicine such as naproxen twice daily for the next 1-2 weeks. I have also prescribed a pain medicine tramadol you may take as needed.  [name]:  Rest- try to minimize use of the toe initially while it starts to heal. Use a cane or walker if possible to get off the foot entirely.  Ice- you may use direct cold up to 15 minutes at a time several times per day for relief of swelling and pain  Compression- I recommend an ACE wrap or similar to help get the swelling and inflammatory fluids away from the toe, also helps to stabilize it  Elevation- whenever seated try to elevate your foot to at least waist height

## 2016-12-15 NOTE — Progress Notes (Signed)
Internal Medicine Clinic Attending  Case discussed with Dr. Rice at the time of the visit.  We reviewed the resident's history and exam and pertinent patient test results.  I agree with the assessment, diagnosis, and plan of care documented in the resident's note.  

## 2016-12-15 NOTE — Assessment & Plan Note (Addendum)
HPI: He injured his right foot after stepping awkwardly from a curb and subsequently dropping a full 2 liter bottle onto the same toe later in the same evening. He was seen at the ED on 6/21 for this with a foot xray obtained showing no acute fracture. He has taken NSAIDs since that time with partial relief but has continued pain and swelling. The pain is worst when walking and he is wearing slippers rather than shoes due to sensitivity to pressure on the joint.  A: Right 1st MTP soft tissue injury I suspect some degree of ligamentous injury since there is pain with passive ROM in the joint even without loadbearing. He is still only 4 days into recovery so continued pain and swelling is still a typical presentation. If his pain persisted without improvement for several weeks we would need to consider re-imaging.  P: Recommended he take naproxen as a scheduled treatment for 2 weeks or until pain improves Instructed on Matthew Guerrero supportive treatment plan-see AVS for details discussed Rx sent for tramadol 50mg  q6hrs PRN #20 tablets for additional pain relief to maintain good mobility during recovery after reported poor response to NSAIDs for pain so far

## 2016-12-22 ENCOUNTER — Ambulatory Visit (INDEPENDENT_AMBULATORY_CARE_PROVIDER_SITE_OTHER): Payer: Medicaid Other | Admitting: *Deleted

## 2016-12-22 DIAGNOSIS — I428 Other cardiomyopathies: Secondary | ICD-10-CM

## 2016-12-22 NOTE — Progress Notes (Signed)
Remote ICD transmission.   

## 2016-12-25 ENCOUNTER — Encounter: Payer: Self-pay | Admitting: Cardiology

## 2017-01-06 LAB — CUP PACEART REMOTE DEVICE CHECK
Date Time Interrogation Session: 20180703112100
Implantable Lead Implant Date: 20160929
Implantable Pulse Generator Implant Date: 20160929
MDC IDC LEAD LOCATION: 753862
MDC IDC MSMT BATTERY REMAINING PERCENTAGE: 80 %
Pulse Gen Serial Number: 118449

## 2017-01-20 ENCOUNTER — Other Ambulatory Visit: Payer: Self-pay | Admitting: Internal Medicine

## 2017-01-26 ENCOUNTER — Other Ambulatory Visit: Payer: Self-pay | Admitting: Internal Medicine

## 2017-02-01 ENCOUNTER — Other Ambulatory Visit: Payer: Self-pay

## 2017-02-01 ENCOUNTER — Ambulatory Visit (HOSPITAL_COMMUNITY): Payer: Medicaid Other | Attending: Cardiovascular Disease

## 2017-02-01 DIAGNOSIS — E785 Hyperlipidemia, unspecified: Secondary | ICD-10-CM | POA: Diagnosis not present

## 2017-02-01 DIAGNOSIS — I509 Heart failure, unspecified: Secondary | ICD-10-CM | POA: Insufficient documentation

## 2017-02-01 DIAGNOSIS — I428 Other cardiomyopathies: Secondary | ICD-10-CM | POA: Insufficient documentation

## 2017-02-01 DIAGNOSIS — I340 Nonrheumatic mitral (valve) insufficiency: Secondary | ICD-10-CM | POA: Diagnosis not present

## 2017-02-01 DIAGNOSIS — I110 Hypertensive heart disease with heart failure: Secondary | ICD-10-CM | POA: Insufficient documentation

## 2017-02-01 DIAGNOSIS — I429 Cardiomyopathy, unspecified: Secondary | ICD-10-CM | POA: Diagnosis present

## 2017-02-01 LAB — ECHOCARDIOGRAM COMPLETE
Area-P 1/2: 2.32 cm2
CHL CUP DOP CALC LVOT VTI: 10.3 cm
CHL CUP MV DEC (S): 250
E/e' ratio: 5.54
EWDT: 250 ms
FS: 14 % — AB (ref 28–44)
IVS/LV PW RATIO, ED: 0.5
LA ID, A-P, ES: 41 mm
LA vol A4C: 53.2 ml
LADIAMINDEX: 1.95 cm/m2
LDCA: 4.15 cm2
LEFT ATRIUM END SYS DIAM: 41 mm
LV E/e' medial: 5.54
LV PW d: 14 mm — AB (ref 0.6–1.1)
LV TDI E'LATERAL: 9.03
LV e' LATERAL: 9.03 cm/s
LVEEAVG: 5.54
LVOT SV: 43 mL
LVOTD: 23 mm
LVOTPV: 53.9 cm/s
MV pk A vel: 63.1 m/s
MV pk E vel: 50 m/s
P 1/2 time: 220 ms
P 1/2 time: 73 ms
RV LATERAL S' VELOCITY: 10.2 cm/s
TAPSE: 15.3 mm
TDI e' medial: 5.11

## 2017-02-08 ENCOUNTER — Encounter: Payer: Self-pay | Admitting: Internal Medicine

## 2017-02-08 ENCOUNTER — Ambulatory Visit (INDEPENDENT_AMBULATORY_CARE_PROVIDER_SITE_OTHER): Payer: Medicaid Other | Admitting: Internal Medicine

## 2017-02-08 ENCOUNTER — Telehealth: Payer: Self-pay | Admitting: Internal Medicine

## 2017-02-08 VITALS — BP 122/74 | HR 63 | Ht 70.0 in | Wt 196.0 lb

## 2017-02-08 DIAGNOSIS — I5022 Chronic systolic (congestive) heart failure: Secondary | ICD-10-CM

## 2017-02-08 DIAGNOSIS — I428 Other cardiomyopathies: Secondary | ICD-10-CM

## 2017-02-08 DIAGNOSIS — I10 Essential (primary) hypertension: Secondary | ICD-10-CM

## 2017-02-08 DIAGNOSIS — Z95810 Presence of automatic (implantable) cardiac defibrillator: Secondary | ICD-10-CM | POA: Insufficient documentation

## 2017-02-08 DIAGNOSIS — Z79899 Other long term (current) drug therapy: Secondary | ICD-10-CM | POA: Diagnosis not present

## 2017-02-08 MED ORDER — SACUBITRIL-VALSARTAN 97-103 MG PO TABS: 1.0000 | ORAL_TABLET | Freq: Two times a day (BID) | ORAL | 5 refills | Status: DC

## 2017-02-08 NOTE — Progress Notes (Signed)
OFFICE NOTE  Chief Complaint:  No complaints  Primary Care Physician: Welford Roche, MD  HPI:  Matthew Guerrero is a 61 y.o. male with a hx of HTN, HL, gout, asthma. He was seen in the emergency room in late March with abdominal pain and vomiting. ECG was abnormal and a fast scan demonstrated no pericardial effusion but global hypokinesis. Plan was to follow-up as an outpatient. However, he presented back to the emergency room with progressive shortness of breath and chest pain with exertion. Admitted 4/5-4/8 with acute systolic HF. EF was noted to be 15-20% on echo. Troponin was minimally elevated (0.14). He was diuresed. LHC was arranged and demonstrated normal coronary arteries. He was dx with NICM. Medications were adjusted for CHF and he returns for FU. Of note, ACE inhibitor was stopped and he was placed on Entresto.   Since DC, he is doing well. His breathing is improved. He is NYHA 2b. He denies orthopnea, PND, edema. He denies chest pain, syncope. Does admit to snoring and does take daytime naps. He has a non-productive cough.    Studies/Reports Reviewed Today:  Echo 09/27/14 - Mild LVH. EF 15% to 20%. Diffuse hypokinesis. There is akinesis of the entireanteroseptal myocardium. Grade 1 diastolic dysfunction. There is muscular trabeculation at apex of left ventricle (no obvious thrombus identified). Definity contrast used. - Mitral valve: There was mild regurgitation. - Left atrium: The atrium was moderately dilated. - Right ventricle: The cavity size was moderately dilated. Wall thickness was normal. - Right atrium: The atrium was mildly dilated.  LHC 09/27/14 The left main coronary artery is normal. The left anterior descending artery is normal. The left circumflex artery is normal. The right coronary artery is dominant and normal. EF: 15%.  Matthew Guerrero has an upcoming sleep study which should be helpful hopefully and may be contributing to his  erythrocytosis. I suspect the etiology of his heart failure is hypertensive heart disease. There remains to be seen whether he'll have improvement in LV function. He seems to be tolerating his medications at this point. He does get somewhat fatigued with walking up stairs and doing activities. I suspect a benefit from cardiac rehabilitation.  I saw Matthew Guerrero back in the office today. He is here for a limited echo follow-up which was performed on 02/08/2015. Unfortunately this shows the EF to to remain between 10 and 15%. You have his medication shows near optimal therapy as he is currently on aspirin, carvedilol, Lasix, and Entresto 49/51 mg. He seems to be tolerating the medications and has NYHA class II symptoms.  Matthew Guerrero returns today for follow-up. He recently had placement of an AICD for primary prevention of sudden cardiac death by Dr. Caryl Comes in 2023-04-21. This went well and since then he his not had any significant problems. He denies any device firings. He had one episode which was difficult to understand that happened in church but required some reprogramming. Weight is stable at 255 which was his weight in 04-21-2023. He continues to be compliant with CPAP and has an appointment to see Dr. Radford Pax for reevaluation in December. He is also seeing Dr. Caryl Comes back in January. He is currently applying for disability. It should be noted that he has class II symptoms and does get short of breath with moderate exertion and is unable to lift more than 10 pounds routinely. His previous job required more physical work than this.  10/25/2015  Matthew Guerrero returns today for follow-up. He's done exceedingly well. He  seems to be tolerating Entresto at the 49/50 one dose. His weight has been stable, in fact it is improved to 230 pounds. He denies any worsening shortness of breath. He currently has class 1-2 symptoms. He's had no AICD firings. He uses CPAP and is been compliant with that. He has a follow-up with  Dr. Radford Pax this summer. He also has an ICD clinic follow-up as well. He is requesting a handicap parking sticker today which I will provide given the significance of his cardiomyopathy.   07/27/2016  Matthew Guerrero was seen in the office for follow-up today. He continues to do very well. He is completely asymptomatic. Weight is now down further from 230 pounds to 217 pounds. He is on the middle dose of interest oh by hesitant to increase the dose higher due to his blood pressure. He is also on a high dose of carvedilol, spironolactone, aspirin and Lasix. He has had no indication of worsening lead impedance on his AICD remote checks. He denies any shocks. He continues to have class 1-2 symptoms. His last echo in August 2017 showed an EF of 15% without any significant improvement. He remains compliant with CPAP.  02/08/2017  Matthew Guerrero returns today for follow-up. He seems to be tolerating Entresto. Recently had an echo which showed improvement in LV function up from 15% to 25-30%. He does have an AICD in place. I've encouraged by the improvement in LV function which may be due to additional medical therapy. He's asymptomatic for most of the time with NYHA class 1-2 symptoms. Blood pressure is stable in the 120s over 70s. EKG shows a sinus rhythm with LVH and repolarization abnormality at 63-personally reviewed. Weight is been stable as well. He denies chest pain or worsening shortness of breath. He is compliant with his CPAP. He has an in office defibrillator check in September.  PMHx:  Past Medical History:  Diagnosis Date  . ALLERGIC RHINITIS   . Asthma   . Cervical radiculitis   . CHF (congestive heart failure) ([place])   . Chronic systolic heart failure ([place]) 02/28/2015  . GERD (gastroesophageal reflux disease)   . GOUT   . Hyperglycemia 08/28/2014  . HYPERLIPIDEMIA   . HYPERTENSION   . Lateral epicondylitis of right elbow   . Nonischemic cardiomyopathy ([place]) 09/28/2014  . Obesity (BMI 30-39.9)  06/06/2015  . S/P cardiac cath wjith normal coronary arteries  09/28/2014  . Sleep apnea     Past Surgical History:  Procedure Laterality Date  . COLONOSCOPY    . EP IMPLANTABLE DEVICE N/A 03/21/2015   Procedure: SubQ ICD Implant;  Surgeon: Deboraha Sprang, MD;  Location: [facility];  Service: Cardiovascular;  Laterality: N/A;  . LEFT AND RIGHT HEART CATHETERIZATION WITH CORONARY ANGIOGRAM N/A 09/27/2014   Procedure: LEFT AND RIGHT HEART CATHETERIZATION WITH CORONARY ANGIOGRAM;  Surgeon: Belva Crome, MD;  Location: [facility] CATH LAB;  Service: Cardiovascular;  Laterality: N/A;  . SUBQ ICD  03/21/2015    FAMHx:  Family History  Problem Relation Guerrero of Onset  . Lung cancer Mother   . Diabetes Father   . Heart disease Father   . Heart attack Father 28  . Hypertension Sister   . Aneurysm Sister 106       brain aneurysm  . Healthy Brother   . Healthy Brother   . Healthy Brother   . Colon cancer Neg Hx   . Esophageal cancer Neg Hx   . Rectal cancer Neg Hx   .  Stomach cancer Neg Hx     SOCHx:   reports that he has never smoked. He has never used smokeless tobacco. He reports that he does not drink alcohol or use drugs.  ALLERGIES:  No Known Allergies  ROS: Pertinent items noted in HPI and remainder of comprehensive ROS otherwise negative.  HOME MEDS: Current Outpatient Prescriptions  Medication Sig Dispense Refill  . aspirin 81 MG EC tablet Take 1 tablet (81 mg total) by mouth daily. 30 tablet 3  . carvedilol (COREG) 25 MG tablet take 1 tablet by mouth twice a day with meals 180 tablet 3  . colchicine 0.6 MG tablet Take 1 tablet (0.6 mg total) by mouth daily. 90 tablet 1  . furosemide (LASIX) 40 MG tablet Take 1 tablet (40 mg total) by mouth daily. 30 tablet 6  . Multiple Vitamin (MULTIVITAMIN WITH MINERALS) TABS tablet Take 1 tablet by mouth daily.    . pantoprazole (PROTONIX) 40 MG tablet Take 1 tablet (40 mg total) by mouth daily. **PT NEEDS PHYSICAL FOR ADDITIONAL  REFILLS** 90 tablet 0  . pravastatin (PRAVACHOL) 40 MG tablet Take 1 tablet (40 mg total) by mouth every evening. 90 tablet 3  . spironolactone (ALDACTONE) 25 MG tablet TAKE 1/2 TABLET BY MOUTH ONCE DAILY 45 tablet 3  . traMADol (ULTRAM) 50 MG tablet Take 1 tablet (50 mg total) by mouth every 6 (six) hours as needed. 20 tablet 0  . sacubitril-valsartan (ENTRESTO) 97-103 MG Take 1 tablet by mouth 2 (two) times daily. 60 tablet 5   No current facility-administered medications for this visit.     LABS/IMAGING: No results found for this or any previous visit (from the past 48 hour(s)). No results found.  WEIGHTS: Wt Readings from Last 3 Encounters:  02/08/17 196 lb (88.9 kg)  12/14/16 194 lb 14.4 oz (88.4 kg)  12/08/16 193 lb 12.8 oz (87.9 kg)    VITALS: BP 122/74   Pulse 63   Ht 5\' 10"  (1.778 m)   Wt 196 lb (88.9 kg)   BMI 28.12 kg/m   EXAM: General appearance: alert and no distress Neck: no carotid bruit, no JVD and thyroid not enlarged, symmetric, no tenderness/mass/nodules Lungs: clear to auscultation bilaterally Heart: regular rate and rhythm, S1, S2 normal, no murmur, click, rub or gallop and AICD pocket is intact Abdomen: soft, non-tender; bowel sounds normal; no masses,  no organomegaly Extremities: extremities normal, atraumatic, no cyanosis or edema Pulses: 2+ and symmetric Skin: Skin color, texture, turgor normal. No rashes or lesions Neurologic: Grossly normal Psych: Pleasant  EKG: Sinus rhythm at 63, LVH with repolarization abnormality-personally reviewed  ASSESSMENT: 1. Nonischemic, probably hypertensive cardiomyopathy, EF 15% with normal coronaries-NYHA class I-II symptoms (EF improved to 25-30%-01/2017) 2. Hypertension-controlled 3. Dyslipidemia 4. OSA on CPAP 5. Gout 6. Status post AICD for primary prevention of sudden cardiac death  PLAN: 1.   Matthew Guerrero continues to have mostly class I and infrequent class II symptoms. His EF has improved from 15% up  to 25-30%. He is on guideline directed therapy for heart failure. Currently he is on the intermediate dose of Entresto. I do feel he'll be able to tolerate the high-dose and we'll switch that today. Plan a repeat metabolic profile in one week. Otherwise follow-up with me in 6 months or sooner as necessary.  Pixie Casino, MD, [facility] Attending Cardiologist [place] 02/08/2017, 1:51 PM

## 2017-02-08 NOTE — Telephone Encounter (Signed)
Contacted [facility] pharmacy - unable to get thru at this time.

## 2017-02-08 NOTE — Patient Instructions (Signed)
Your physician has recommended you make the following change in your medication:  -- INCREASE entresto 97-103mg  twice daily  Your physician recommends that you return for lab work in: [place] (BMET)   Your physician wants you to follow-up in: 6 months with Dr. Debara Pickett. You will receive a reminder letter in the mail two months in advance. If you don't receive a letter, please call our office to schedule the follow-up appointment.

## 2017-02-08 NOTE — Telephone Encounter (Signed)
Patient called in to office stating that his pharmacy told him they cannot fill his entresto 97-103mg  at this time. Unsure if a PA is needed at present, as I have not received a notification from pharmacy. Informed patient I would call his pharmacy to inquire about what is needed and keep him updated. He is aware to continue on current dose (entresto 49-51mg ) until I notify him otherwise. He is aware that if a PA is needed, it will likely not be completed today.

## 2017-02-09 NOTE — Telephone Encounter (Signed)
Prior auth form completed for [employer]. Will have MD sign on 02/10/17 and fax.

## 2017-02-10 NOTE — Telephone Encounter (Signed)
Patient called in to office to ask when he should have lab work. Explained that he should to labs after he has been on increased dose of entresto for 1 week

## 2017-02-10 NOTE — Telephone Encounter (Signed)
Prior authorization for [facility] faxed to [employer].  MyChart message sent to patient w/this update

## 2017-02-12 NOTE — Telephone Encounter (Signed)
Called [employer] @ [phone] for PA for [facility] after receiving a notice that this medication cannot have a PA completed via fax. Completed PA request via phone which is being submitted for review  PA [id number]  Interaction ID for prior auth: [id number]

## 2017-02-13 ENCOUNTER — Other Ambulatory Visit: Payer: Self-pay | Admitting: Internal Medicine

## 2017-02-13 ENCOUNTER — Encounter: Payer: Self-pay | Admitting: Internal Medicine

## 2017-02-15 ENCOUNTER — Other Ambulatory Visit: Payer: Self-pay

## 2017-02-15 ENCOUNTER — Encounter: Payer: Self-pay | Admitting: Hematology and Oncology

## 2017-02-15 ENCOUNTER — Telehealth: Payer: Self-pay | Admitting: Hematology and Oncology

## 2017-02-15 ENCOUNTER — Other Ambulatory Visit: Payer: Self-pay | Admitting: *Deleted

## 2017-02-15 ENCOUNTER — Ambulatory Visit (HOSPITAL_BASED_OUTPATIENT_CLINIC_OR_DEPARTMENT_OTHER): Payer: Medicaid Other | Admitting: Hematology and Oncology

## 2017-02-15 ENCOUNTER — Ambulatory Visit (HOSPITAL_BASED_OUTPATIENT_CLINIC_OR_DEPARTMENT_OTHER): Payer: Medicaid Other

## 2017-02-15 DIAGNOSIS — D696 Thrombocytopenia, unspecified: Secondary | ICD-10-CM

## 2017-02-15 DIAGNOSIS — D751 Secondary polycythemia: Secondary | ICD-10-CM | POA: Diagnosis present

## 2017-02-15 LAB — CBC WITH DIFFERENTIAL/PLATELET
BASO%: 0.2 % (ref 0.0–2.0)
BASOS ABS: 0 10*3/uL (ref 0.0–0.1)
EOS ABS: 0.1 10*3/uL (ref 0.0–0.5)
EOS%: 2.3 % (ref 0.0–7.0)
HEMATOCRIT: 48.2 % (ref 38.4–49.9)
HGB: 15.5 g/dL (ref 13.0–17.1)
LYMPH%: 42 % (ref 14.0–49.0)
MCH: 29 pg (ref 27.2–33.4)
MCHC: 32.2 g/dL (ref 32.0–36.0)
MCV: 90.1 fL (ref 79.3–98.0)
MONO#: 0.5 10*3/uL (ref 0.1–0.9)
MONO%: 9 % (ref 0.0–14.0)
NEUT%: 46.5 % (ref 39.0–75.0)
NEUTROS ABS: 2.6 10*3/uL (ref 1.5–6.5)
PLATELETS: 124 10*3/uL — AB (ref 140–400)
RBC: 5.35 10*6/uL (ref 4.20–5.82)
RDW: 14.1 % (ref 11.0–14.6)
WBC: 5.7 10*3/uL (ref 4.0–10.3)
lymph#: 2.4 10*3/uL (ref 0.9–3.3)

## 2017-02-15 NOTE — Progress Notes (Signed)
Patient Care Team: Welford Roche, MD as PCP - General  DIAGNOSIS:  Encounter Diagnosis  Name Primary?  . Polycythemia, secondary     CHIEF COMPLIANT: Follow-up to review blood work for secondary polycythemia  INTERVAL HISTORY: Matthew Guerrero is a 61 year old gentleman with above-mentioned history of secondary polycythemia most likely attributed to his cardiac issues who is here for routine follow-up. He denies any new complaints or concerns. Patient informed me that his cardiac issues have improved significantly. 3 months ago in May 2018 his hemoglobin was 16.5.  REVIEW OF SYSTEMS:   Constitutional: Denies fevers, chills or abnormal weight loss Eyes: Denies blurriness of vision Ears, nose, mouth, throat, and face: Denies mucositis or sore throat Respiratory: Denies cough, dyspnea or wheezes Cardiovascular: Denies palpitation, chest discomfort Gastrointestinal:  Denies nausea, heartburn or change in bowel habits Skin: Denies abnormal skin rashes Lymphatics: Denies new lymphadenopathy or easy bruising Neurological:Denies numbness, tingling or new weaknesses Behavioral/Psych: Mood is stable, no new changes  Extremities: No lower extremity edema  All other systems were reviewed with the patient and are negative.  I have reviewed the past medical history, past surgical history, social history and family history with the patient and they are unchanged from previous note.  ALLERGIES:  has No Known Allergies.  MEDICATIONS:  Current Outpatient Prescriptions  Medication Sig Dispense Refill  . aspirin 81 MG EC tablet Take 1 tablet (81 mg total) by mouth daily. 30 tablet 3  . carvedilol (COREG) 25 MG tablet take 1 tablet by mouth twice a day with meals 180 tablet 3  . colchicine 0.6 MG tablet Take 1 tablet (0.6 mg total) by mouth daily. 90 tablet 1  . furosemide (LASIX) 40 MG tablet Take 1 tablet (40 mg total) by mouth daily. 30 tablet 6  . Multiple Vitamin (MULTIVITAMIN  WITH MINERALS) TABS tablet Take 1 tablet by mouth daily.    . pantoprazole (PROTONIX) 40 MG tablet Take 1 tablet (40 mg total) by mouth daily. **PT NEEDS PHYSICAL FOR ADDITIONAL REFILLS** 90 tablet 0  . pravastatin (PRAVACHOL) 40 MG tablet Take 1 tablet (40 mg total) by mouth every evening. 90 tablet 3  . sacubitril-valsartan (ENTRESTO) 97-103 MG Take 1 tablet by mouth 2 (two) times daily. 60 tablet 5  . spironolactone (ALDACTONE) 25 MG tablet TAKE 1/2 TABLET BY MOUTH ONCE DAILY 45 tablet 3  . traMADol (ULTRAM) 50 MG tablet Take 1 tablet (50 mg total) by mouth every 6 (six) hours as needed. 20 tablet 0   No current facility-administered medications for this visit.     PHYSICAL EXAMINATION: ECOG PERFORMANCE STATUS: 1 - Symptomatic but completely ambulatory  Vitals:   02/15/17 0811  BP: 123/71  Pulse: (!) 59  Resp: 18  Temp: 98.5 F (36.9 C)  SpO2: 99%   Filed Weights   02/15/17 0811  Weight: 196 lb (88.9 kg)    GENERAL:alert, no distress and comfortable SKIN: skin color, texture, turgor are normal, no rashes or significant lesions EYES: normal, Conjunctiva are pink and non-injected, sclera clear OROPHARYNX:no exudate, no erythema and lips, buccal mucosa, and tongue normal  NECK: supple, thyroid normal size, non-tender, without nodularity LYMPH:  no palpable lymphadenopathy in the cervical, axillary or inguinal LUNGS: clear to auscultation and percussion with normal breathing effort HEART: regular rate & rhythm and no murmurs and no lower extremity edema ABDOMEN:abdomen soft, non-tender and normal bowel sounds MUSCULOSKELETAL:no cyanosis of digits and no clubbing  NEURO: alert & oriented x 3 with fluent speech, no focal  motor/sensory deficits EXTREMITIES: No lower extremity edema  I LABORATORY DATA:  I have reviewed the data as listed   Chemistry      Component Value Date/Time   NA 139 11/10/2016 0812   K 3.5 11/10/2016 0812   CL 104 11/10/2016 0812   CO2 28 11/10/2016  0812   BUN 7 11/10/2016 0812   CREATININE 1.05 11/10/2016 0812   CREATININE 1.13 03/20/2016 1021      Component Value Date/Time   CALCIUM 9.3 11/10/2016 0812   ALKPHOS 45 08/29/2015 0942   AST 24 08/29/2015 0942   ALT 22 08/29/2015 0942   BILITOT 0.5 08/29/2015 0942       Lab Results  Component Value Date   WBC 5.7 02/15/2017   HGB 15.5 02/15/2017   HCT 48.2 02/15/2017   MCV 90.1 02/15/2017   PLT 124 (L) 02/15/2017   NEUTROABS 2.6 02/15/2017    ASSESSMENT & PLAN:  Polycythemia, secondary Secondary Polycythemia: Negative JAK2 V617F mutation. Erythropoeitin 18.6 (mildly elevated) 10/26/14 Recommendation: 1. Most likely related to underlying cardiac disease 2. Phlebotomy is not the standard of care for secondary polycythemia. However if his Hb crosses 20, Or if he develops symptoms of congestion, we can consider doing it.  Today's hemoglobin is 15.5and platelet count is 124 I provided a copy of these labs to the patient. I'm very happy with the stability of his hemoglobin. We will continue to watch and monitor him.  Thrombocytopenia: Suspicious for low grade ITP (Large platelets on smear suggest increased destruction). Today is 124.   Return to clinic in 1 yr   I spent 25 minutes talking to the patient of which more than half was spent in counseling and coordination of care.  Orders Placed This Encounter  Procedures  . CBC with Differential    Standing Status:   Future    Number of Occurrences:   1    Standing Expiration Date:   02/15/2018  . CBC with Differential/Platelet    Standing Status:   Future    Standing Expiration Date:   03/22/2018   The patient has a good understanding of the overall plan. he agrees with it. he will call with any problems that may develop before the next visit here.   Rulon Eisenmenger, MD 02/15/17

## 2017-02-15 NOTE — Telephone Encounter (Signed)
Gave patient avs report and appointments for August 2019.  °

## 2017-02-15 NOTE — Assessment & Plan Note (Signed)
Secondary Polycythemia: Negative JAK2 V617F mutation. Erythropoeitin 18.6 (mildly elevated) 10/26/14 Recommendation: 1. Most likely related to underlying cardiac disease 2. Phlebotomy is not the standard of care for secondary polycythemia. However if his Hb crosses 20, Or if he develops symptoms of congestion, we can consider doing it.  Today's hemoglobin is 17.1and platelet count is 128 I provided a copy of these labs to the patient. I'm very happy with the stability of his hemoglobin. We will continue to watch and monitor him.  Thrombocytopenia: Suspicious for low grade ITP (Large platelets on smear suggest increased destruction). Today is 128.   Return to clinic in 1 yr

## 2017-02-15 NOTE — Telephone Encounter (Signed)
pantoprazole (PROTONIX) 40 MG tablet, Refill request @ [facility] on [address].

## 2017-02-15 NOTE — Telephone Encounter (Signed)
Spoke w/ pt he has refill from Dr. Cathlean Cower at pharmacy, he states in October he will be returning to dr Jenny Reichmann as his pcp because he will then have medicaid and medicare. He states he will call pharmacy and get his refill

## 2017-02-15 NOTE — Telephone Encounter (Signed)
Called [employer] and received notice that Delene Loll is approved  Approved from 02/12/2018 - 02/07/2018  [facility] and confirmed that Rx was running thru on patient's insurance.

## 2017-02-24 LAB — BASIC METABOLIC PANEL
BUN / CREAT RATIO: 10 (ref 10–24)
BUN: 11 mg/dL (ref 8–27)
CO2: 29 mmol/L (ref 20–29)
CREATININE: 1.14 mg/dL (ref 0.76–1.27)
Calcium: 10.2 mg/dL (ref 8.6–10.2)
Chloride: 103 mmol/L (ref 96–106)
GFR calc Af Amer: 79 mL/min/{1.73_m2} (ref >59)
GFR, EST NON AFRICAN AMERICAN: 69 mL/min/{1.73_m2} (ref >59)
Glucose: 133 mg/dL — ABNORMAL HIGH (ref 65–99)
Potassium: 4.8 mmol/L (ref 3.5–5.2)
SODIUM: 141 mmol/L (ref 134–144)

## 2017-03-02 NOTE — Progress Notes (Signed)
Electrophysiology Office Note Date: 03/03/2017  ID:  Matthew Guerrero, DOB 05-15-1956, MRN [id number]  PCP: Welford Roche, MD Primary Cardiologist: Debara Pickett Electrophysiologist: Caryl Comes  CC: Routine ICD follow-up  Matthew Guerrero is a 61 y.o. male seen today for Dr Caryl Comes.  He presents today for routine electrophysiology followup.  Since last being seen in our clinic, the patient reports doing very well. He has intentionally lost around 80 pounds intentionally. He denies chest pain, palpitations, dyspnea, PND, orthopnea, nausea, vomiting, dizziness, syncope, edema, weight gain, or early satiety.  He has not had ICD shocks.   Device History: BSX S-ICD implanted 2016 for NICM History of appropriate therapy: No History of AAD therapy: No   Past Medical History:  Diagnosis Date  . ALLERGIC RHINITIS   . Asthma   . Cervical radiculitis   . CHF (congestive heart failure) ([place])   . Chronic systolic heart failure ([place]) 02/28/2015  . GERD (gastroesophageal reflux disease)   . GOUT   . Hyperglycemia 08/28/2014  . HYPERLIPIDEMIA   . HYPERTENSION   . Lateral epicondylitis of right elbow   . Nonischemic cardiomyopathy ([place]) 09/28/2014  . Obesity (BMI 30-39.9) 06/06/2015  . S/P cardiac cath wjith normal coronary arteries  09/28/2014  . Sleep apnea    Past Surgical History:  Procedure Laterality Date  . COLONOSCOPY    . EP IMPLANTABLE DEVICE N/A 03/21/2015   Procedure: SubQ ICD Implant;  Surgeon: Deboraha Sprang, MD;  Location: [place] [facility];  Service: Cardiovascular;  Laterality: N/A;  . LEFT AND RIGHT HEART CATHETERIZATION WITH CORONARY ANGIOGRAM N/A 09/27/2014   Procedure: LEFT AND RIGHT HEART CATHETERIZATION WITH CORONARY ANGIOGRAM;  Surgeon: Belva Crome, MD;  [facility] CATH [facility];  Service: Cardiovascular;  Laterality: N/A;  . SUBQ ICD  03/21/2015    Current Outpatient Prescriptions  Medication Sig Dispense Refill  . aspirin 81 MG EC tablet Take 1 tablet (81 mg total) by  mouth daily. 30 tablet 3  . carvedilol (COREG) 25 MG tablet take 1 tablet by mouth twice a day with meals 180 tablet 3  . colchicine 0.6 MG tablet Take 1 tablet (0.6 mg total) by mouth daily. 90 tablet 1  . furosemide (LASIX) 40 MG tablet Take 1 tablet (40 mg total) by mouth daily. 30 tablet 6  . Multiple Vitamin (MULTIVITAMIN WITH MINERALS) TABS tablet Take 1 tablet by mouth daily.    . pantoprazole (PROTONIX) 40 MG tablet Take 1 tablet (40 mg total) by mouth daily. **PT NEEDS PHYSICAL FOR ADDITIONAL REFILLS** 90 tablet 0  . pravastatin (PRAVACHOL) 40 MG tablet Take 1 tablet (40 mg total) by mouth every evening. 90 tablet 3  . sacubitril-valsartan (ENTRESTO) 97-103 MG Take 1 tablet by mouth 2 (two) times daily. 60 tablet 5  . spironolactone (ALDACTONE) 25 MG tablet TAKE 1/2 TABLET BY MOUTH ONCE DAILY 45 tablet 3  . traMADol (ULTRAM) 50 MG tablet Take 50 mg by mouth every 6 (six) hours as needed for moderate pain.     No current facility-administered medications for this visit.     Allergies:   Patient has no known allergies.   Social History: Social History   Social History  . Marital status: Single    Spouse name: N/A  . Number of children: N/A  . Years of education: N/A   Occupational History  . Not on file.   Social History Main Topics  . Smoking status: Never Smoker  . Smokeless tobacco: Never Used  . Alcohol use  No  . Drug use: No  . Sexual activity: Not on file   Other Topics Concern  . Not on file   Social History Narrative  . No narrative on file    Family History: Family History  Problem Relation Age of Onset  . Lung cancer Mother   . Diabetes Father   . Heart disease Father   . Heart attack Father 22  . Hypertension Sister   . Aneurysm Sister 79       brain aneurysm  . Healthy Brother   . Healthy Brother   . Healthy Brother   . Colon cancer Neg Hx   . Esophageal cancer Neg Hx   . Rectal cancer Neg Hx   . Stomach cancer Neg Hx     Review of  Systems: All other systems reviewed and are otherwise negative except as noted above.   Physical Exam: VS:  BP 122/82   Pulse 74   Ht 5\' 10"  (1.778 m)   Wt 194 lb 12.8 oz (88.4 kg)   SpO2 95%   BMI 27.95 kg/m  , BMI Body mass index is 27.95 kg/m.  GEN- The patient is well appearing, alert and oriented x 3 today.   HEENT: normocephalic, atraumatic; sclera clear, conjunctiva pink; hearing intact; oropharynx clear; neck supple Lungs- Clear to ausculation bilaterally, normal work of breathing.  No wheezes, rales, rhonchi Heart- Regular rate and rhythm GI- soft, non-tender, non-distended, bowel sounds present Extremities- no clubbing, cyanosis, or edema; DP/PT/radial pulses 2+ bilaterally MS- no significant deformity or atrophy Skin- warm and dry, no rash or lesion; ICD pocket well healed Psych- euthymic mood, full affect Neuro- strength and sensation are intact  ICD interrogation- reviewed in detail today,  See PACEART report  EKG:  EKG is not ordered today.  Recent Labs: 11/10/2016: B Natriuretic Peptide 102.9 02/15/2017: HGB 15.5; Platelets 124 02/24/2017: BUN 11; Creatinine, Ser 1.14; Potassium 4.8; Sodium 141   Wt Readings from Last 3 Encounters:  03/03/17 194 lb 12.8 oz (88.4 kg)  02/15/17 196 lb (88.9 kg)  02/08/17 196 lb (88.9 kg)     Other studies Reviewed: Additional studies/ records that were reviewed today include: Dr Caryl Comes and Dr [facility] office notes   Assessment and Plan:  1.  Chronic systolic dysfunction euvolemic today Stable on an appropriate medical regimen Normal ICD function See [facility] report No changes today He previously had TWOS in the secondary vector, now programmed alternate  2.  HTN Stable No change required today  3.  OSA Compliance with CPAP encouraged. He reports that with weight loss, he has not been using as much. Will send note to Dr Radford Pax to evaluate need for repeat sleep study.     Current medicines are reviewed at length with  the patient today.   The patient does not have concerns regarding his medicines.  The following changes were made today:  none  Labs/ tests ordered today include: none No orders of the defined types were placed in this encounter.    Disposition:   Follow up with [facility], Dr Caryl Comes 1 year      Signed, Chanetta Marshall, NP 03/03/2017 8:15 AM  [facility] [address] [place] [address] [phone] (office) [phone] (fax)

## 2017-03-03 ENCOUNTER — Encounter: Payer: Self-pay | Admitting: Nurse Practitioner

## 2017-03-03 ENCOUNTER — Telehealth: Payer: Self-pay | Admitting: *Deleted

## 2017-03-03 ENCOUNTER — Ambulatory Visit (INDEPENDENT_AMBULATORY_CARE_PROVIDER_SITE_OTHER): Payer: Medicaid Other | Admitting: Nurse Practitioner

## 2017-03-03 VITALS — BP 122/82 | HR 74 | Ht 70.0 in | Wt 194.8 lb

## 2017-03-03 DIAGNOSIS — I428 Other cardiomyopathies: Secondary | ICD-10-CM

## 2017-03-03 DIAGNOSIS — G4733 Obstructive sleep apnea (adult) (pediatric): Secondary | ICD-10-CM

## 2017-03-03 DIAGNOSIS — I5022 Chronic systolic (congestive) heart failure: Secondary | ICD-10-CM

## 2017-03-03 DIAGNOSIS — I10 Essential (primary) hypertension: Secondary | ICD-10-CM

## 2017-03-03 NOTE — Telephone Encounter (Signed)
-----   Message from Sueanne Margarita, MD sent at 03/03/2017  3:25 PM EDT ----- Please find out why patient is not using his CPAP  Traci ----- Message ----- From: Patsey Berthold, NP Sent: 03/03/2017   8:21 AM To: Sueanne Margarita, MD

## 2017-03-03 NOTE — Patient Instructions (Addendum)
Medication Instructions:  Your physician recommends that you continue on your current medications as directed. Please refer to the Current Medication list given to you today.   Labwork: None ordered  Testing/Procedures: None ordered  Follow-Up: Remote monitoring is used to monitor your Pacemaker of ICD from home. This monitoring reduces the number of office visits required to check your device to one time per year. It allows [place] to keep an eye on the functioning of your device to ensure it is working properly. You are scheduled for a device check from home on 03/23/2017.  You may send your transmission at any time that day. If you have a wireless device, the transmission will be sent automatically. After your physician reviews your transmission, you will receive a postcard with your next transmission date.   Your physician wants you to follow-up in: [place] will receive a reminder letter in the mail two months in advance. If you don't receive a letter, please call our office to schedule the follow-up appointment.   Any Other Special Instructions Will Be Listed Below (If Applicable).    If you need a refill on your cardiac medications before your next appointment, please call your pharmacy.

## 2017-03-03 NOTE — Telephone Encounter (Signed)
He stopped using his machine on a regular basis because his roommate got sick and he kept getting up out of the bed at all times of the night.  Patient says he lost down from 220 lbs to 191 lbs and when he starteded back wearing his mask it he felt like he wasn't getting enough air. Patient says he mentioned to you at his last visit that he had not been wearing his machine like he should because of all the interruptions that happen when he is trying to wear his mask. Patient says if you tell him to start back using it regularly he will be compliant.

## 2017-03-08 ENCOUNTER — Telehealth: Payer: Self-pay | Admitting: Internal Medicine

## 2017-03-08 NOTE — Telephone Encounter (Signed)
Ok with me 

## 2017-03-08 NOTE — Telephone Encounter (Signed)
Pt called and was last seen 02/2016, he switched primary care providers because of an insurance issue, he has now gotten new insurance and would like to come back to you. Will you continue to see him? Please advise

## 2017-03-08 NOTE — Telephone Encounter (Signed)
Returning your call. °

## 2017-03-09 ENCOUNTER — Telehealth: Payer: Self-pay | Admitting: Internal Medicine

## 2017-03-09 NOTE — Telephone Encounter (Signed)
New message    Pt is calling back for [place].   Pt c/o BP issue: STAT if pt c/o blurred vision, one-sided weakness or slurred speech  1. What are your last 5 BP readings? 93/70  2. Are you having any other symptoms (ex. Dizziness, headache, blurred vision, passed out)? Dizziness started yesterday  3. What is your BP issue? Pt started new medication and calling to report his BP.

## 2017-03-09 NOTE — Telephone Encounter (Signed)
Got patient scheduled

## 2017-03-09 NOTE — Telephone Encounter (Signed)
Returned call to patient of Dr. Debara Pickett He states his increased dose of entresto was causing him some lightheadedness He has been on entresto 97-103mg  BID for about 2 weeks Last 2 cardiology visits BP was in 120s/80s He states he started feeling better last night after his pharmacist recommended he take position changes slowly He is not checking home BP Advised to check home BP to see if pressure is lower than normal He states he will call back if symptoms persist/worsen

## 2017-03-09 NOTE — Telephone Encounter (Signed)
Returned call to patient of Dr. Debara Pickett - recently increased entresto to 97/103 BID He checked BP at [place] this morning after taking BP Reading was 93/70 He feels OK  Informed him that if he is asymptomatic he should continue current meds. Advised I will notify MD

## 2017-03-10 ENCOUNTER — Telehealth: Payer: Self-pay | Admitting: Cardiology

## 2017-03-10 NOTE — Telephone Encounter (Signed)
New Message  Pt call requesting to speak with Gae Bon with the sleep department. Pt states he was instructed to start using his cpap machine. Pt states throughout the nights he has been having issue with it working properly. Pt states he has to take it off. Please call back to discuss

## 2017-03-11 NOTE — Telephone Encounter (Signed)
I would monitor BP - if systolic persistently <898 and symptomatic, may need to reduced dose of Entresto.  Dr. Lemmie Evens

## 2017-03-12 NOTE — Telephone Encounter (Signed)
I spoke w patient, he reports since calling on Tuesday, his BPs have been better. He's been running 953-202 systolic, most recently today was 113/67. NAS. Advised pt to continue regular BP checks, and informed him of Dr. Lysbeth Penner advice - if he should have sustained low BPs w symptoms, pt indicates he will call.  He verbalized understanding and thanks for assistance.

## 2017-03-15 ENCOUNTER — Telehealth: Payer: Self-pay | Admitting: *Deleted

## 2017-03-15 DIAGNOSIS — G4733 Obstructive sleep apnea (adult) (pediatric): Secondary | ICD-10-CM

## 2017-03-15 NOTE — Telephone Encounter (Signed)
    Patient called having trouble with his CPAP during the night.  Patient states it starts out running like it should then it feels like no air is coming through the hose so he turns the machine off. Per [facility] Apolonio Schneiders) patient has not received supplies for a year now, patient is eligible but has a debt in collections in the amount of $900. Apolonio Schneiders states if Dr Radford Pax can write an order for a mask refit for medical necessity he may be able to get a new mask but she could not guarantee it. Dr Radford Pax wrote the order and it has been faxed over to the Re-Supply team at [facility].

## 2017-03-15 NOTE — Telephone Encounter (Signed)
Patient called having trouble with his CPAP during the night.  Patient states it starts out running like it should then it feels like no air is coming through the hose so he turns the machine off. Per [facility] Matthew Guerrero) patient has not received supplies for a year now, patient is eligible but has a debt in collections in the amount of $900. Matthew Guerrero states if Dr Radford Pax can write an order for a mask refit for medical necessity he may be able to get a new mask but she could not guarantee it. Dr Radford Pax wrote the order and it has been faxed over to the Re-Supply team at [facility].

## 2017-03-18 ENCOUNTER — Telehealth: Payer: Self-pay

## 2017-03-18 NOTE — Telephone Encounter (Signed)
Patient called to reschedule Feb appointment , He had a mix up with his dates. Per 9/27

## 2017-03-23 ENCOUNTER — Ambulatory Visit (INDEPENDENT_AMBULATORY_CARE_PROVIDER_SITE_OTHER): Payer: Medicare Other | Admitting: *Deleted

## 2017-03-23 DIAGNOSIS — I428 Other cardiomyopathies: Secondary | ICD-10-CM | POA: Diagnosis not present

## 2017-03-23 NOTE — Progress Notes (Signed)
Remote ICD transmission.   

## 2017-03-24 ENCOUNTER — Other Ambulatory Visit: Payer: Self-pay

## 2017-03-24 ENCOUNTER — Encounter: Payer: Self-pay | Admitting: Cardiology

## 2017-03-24 DIAGNOSIS — I428 Other cardiomyopathies: Secondary | ICD-10-CM

## 2017-03-24 DIAGNOSIS — E785 Hyperlipidemia, unspecified: Secondary | ICD-10-CM

## 2017-03-24 MED ORDER — FUROSEMIDE 40 MG PO TABS: 40.0000 mg | ORAL_TABLET | Freq: Every day | ORAL | 3 refills | Status: DC

## 2017-03-24 MED ORDER — COLCHICINE 0.6 MG PO TABS: 0.6000 mg | ORAL_TABLET | Freq: Every day | ORAL | 3 refills | Status: DC

## 2017-03-24 MED ORDER — SPIRONOLACTONE 25 MG PO TABS: 12.5000 mg | ORAL_TABLET | Freq: Every day | ORAL | 3 refills | Status: DC

## 2017-03-24 MED ORDER — PRAVASTATIN SODIUM 40 MG PO TABS: 40.0000 mg | ORAL_TABLET | Freq: Every evening | ORAL | 3 refills | Status: DC

## 2017-03-24 MED ORDER — SACUBITRIL-VALSARTAN 97-103 MG PO TABS: 1.0000 | ORAL_TABLET | Freq: Two times a day (BID) | ORAL | 3 refills | Status: DC

## 2017-03-24 MED ORDER — ASPIRIN 81 MG PO TBEC: 81.0000 mg | DELAYED_RELEASE_TABLET | Freq: Every day | ORAL | 3 refills | Status: DC

## 2017-03-26 LAB — CUP PACEART REMOTE DEVICE CHECK
Battery Remaining Percentage: 76 %
Implantable Lead Location: 753862
Implantable Lead Model: 3401
Implantable Pulse Generator Implant Date: 20160929
MDC IDC LEAD IMPLANT DT: 20160929
MDC IDC PG SERIAL: 118449
MDC IDC SESS DTM: 20181002120700

## 2017-03-29 ENCOUNTER — Ambulatory Visit (INDEPENDENT_AMBULATORY_CARE_PROVIDER_SITE_OTHER): Payer: Medicare Other | Admitting: Internal Medicine

## 2017-03-29 ENCOUNTER — Encounter: Payer: Self-pay | Admitting: Gastroenterology

## 2017-03-29 ENCOUNTER — Other Ambulatory Visit (INDEPENDENT_AMBULATORY_CARE_PROVIDER_SITE_OTHER): Payer: Medicare Other

## 2017-03-29 ENCOUNTER — Encounter: Payer: Self-pay | Admitting: Internal Medicine

## 2017-03-29 VITALS — BP 116/78 | HR 62 | Temp 98.6°F | Ht 70.0 in | Wt 194.0 lb

## 2017-03-29 DIAGNOSIS — Z0000 Encounter for general adult medical examination without abnormal findings: Secondary | ICD-10-CM

## 2017-03-29 DIAGNOSIS — Z86010 Personal history of colonic polyps: Secondary | ICD-10-CM | POA: Diagnosis not present

## 2017-03-29 DIAGNOSIS — Z114 Encounter for screening for human immunodeficiency virus [HIV]: Secondary | ICD-10-CM

## 2017-03-29 LAB — BASIC METABOLIC PANEL
BUN: 10 mg/dL (ref 6–23)
CO2: 30 mEq/L (ref 19–32)
Calcium: 9.6 mg/dL (ref 8.4–10.5)
Chloride: 102 mEq/L (ref 96–112)
Creatinine, Ser: 1.08 mg/dL (ref 0.40–1.50)
GFR: 88.93 mL/min (ref >60.00)
Glucose, Bld: 102 mg/dL — ABNORMAL HIGH (ref 70–99)
Potassium: 3.9 mEq/L (ref 3.5–5.1)
Sodium: 140 mEq/L (ref 135–145)

## 2017-03-29 LAB — URINALYSIS, ROUTINE W REFLEX MICROSCOPIC
BILIRUBIN URINE: NEGATIVE
HGB URINE DIPSTICK: NEGATIVE
KETONES UR: NEGATIVE
LEUKOCYTES UA: NEGATIVE
NITRITE: NEGATIVE
PH: 5.5 (ref 5.0–8.0)
Specific Gravity, Urine: 1.015 (ref 1.000–1.030)
Total Protein, Urine: NEGATIVE
Urine Glucose: NEGATIVE
Urobilinogen, UA: 0.2 (ref 0.0–1.0)

## 2017-03-29 LAB — HEPATIC FUNCTION PANEL
ALT: 20 U/L (ref 0–53)
AST: 17 U/L (ref 0–37)
Albumin: 4.8 g/dL (ref 3.5–5.2)
Alkaline Phosphatase: 46 U/L (ref 39–117)
Bilirubin, Direct: 0.2 mg/dL (ref 0.0–0.3)
Total Bilirubin: 0.9 mg/dL (ref 0.2–1.2)
Total Protein: 7.3 g/dL (ref 6.0–8.3)

## 2017-03-29 LAB — CUP PACEART INCLINIC DEVICE CHECK
Implantable Lead Model: 3401
Implantable Pulse Generator Implant Date: 20160929
MDC IDC LEAD IMPLANT DT: 20160929
MDC IDC LEAD LOCATION: 753862
MDC IDC SESS DTM: 20181008075837
Pulse Gen Serial Number: 118449

## 2017-03-29 LAB — CBC WITH DIFFERENTIAL/PLATELET
BASOS ABS: 0 10*3/uL (ref 0.0–0.1)
Basophils Relative: 0.5 % (ref 0.0–3.0)
Eosinophils Absolute: 0.1 10*3/uL (ref 0.0–0.7)
Eosinophils Relative: 1.7 % (ref 0.0–5.0)
HCT: 50 % (ref 39.0–52.0)
Hemoglobin: 16.1 g/dL (ref 13.0–17.0)
LYMPHS ABS: 2.7 10*3/uL (ref 0.7–4.0)
Lymphocytes Relative: 46.7 % — ABNORMAL HIGH (ref 12.0–46.0)
MCHC: 32.1 g/dL (ref 30.0–36.0)
MCV: 89.2 fl (ref 78.0–100.0)
MONO ABS: 0.4 10*3/uL (ref 0.1–1.0)
MONOS PCT: 7.4 % (ref 3.0–12.0)
NEUTROS PCT: 43.7 % (ref 43.0–77.0)
Neutro Abs: 2.5 10*3/uL (ref 1.4–7.7)
Platelets: 124 10*3/uL — ABNORMAL LOW (ref 150.0–400.0)
RBC: 5.6 Mil/uL (ref 4.22–5.81)
RDW: 13.9 % (ref 11.5–15.5)
WBC: 5.8 10*3/uL (ref 4.0–10.5)

## 2017-03-29 LAB — TSH: TSH: 1.18 u[IU]/mL (ref 0.35–4.50)

## 2017-03-29 LAB — LIPID PANEL
CHOLESTEROL: 151 mg/dL (ref 0–200)
HDL: 40.8 mg/dL (ref >39.00)
LDL Cholesterol: 80 mg/dL (ref 0–99)
NONHDL: 110.11
Total CHOL/HDL Ratio: 4
Triglycerides: 152 mg/dL — ABNORMAL HIGH (ref 0.0–149.0)
VLDL: 30.4 mg/dL (ref 0.0–40.0)

## 2017-03-29 LAB — PSA: PSA: 0.63 ng/mL (ref 0.10–4.00)

## 2017-03-29 NOTE — Assessment & Plan Note (Signed)

## 2017-03-29 NOTE — Progress Notes (Signed)
Subjective:    Patient ID: Matthew Guerrero, male    DOB: December 04, 1955, 61 y.o.   MRN: [id number]  HPI  Here after recent care by [facility] provider, but now with [facility] and here to continue care, last seen sept 2017.  Here for wellness and f/u;  Overall doing ok;  Pt denies Chest pain, worsening SOB, DOE, wheezing, orthopnea, PND, worsening LE edema, palpitations, dizziness or syncope.  Pt denies neurological change such as new headache, facial or extremity weakness.  Pt denies polydipsia, polyuria, or low sugar symptoms. Pt states overall good compliance with treatment and medications, good tolerability, and has been trying to follow appropriate diet.  Pt denies worsening depressive symptoms, suicidal ideation or panic. No fever, night sweats, wt loss, loss of appetite, or other constitutional symptoms.  Pt states good ability with ADL's, has low fall risk, home safety reviewed and adequate, no other significant changes in hearing or vision, and only occasionally active with exercise.  Due for colonoscopy.  Follows closely with cardiology  Has no complaints today, No other interval hx Past Medical History:  Diagnosis Date  . ALLERGIC RHINITIS   . Asthma   . Cervical radiculitis   . CHF (congestive heart failure) ([place])   . Chronic systolic heart failure ([place]) 02/28/2015  . GERD (gastroesophageal reflux disease)   . GOUT   . Hyperglycemia 08/28/2014  . HYPERLIPIDEMIA   . HYPERTENSION   . Lateral epicondylitis of right elbow   . Nonischemic cardiomyopathy ([place]) 09/28/2014  . Obesity (BMI 30-39.9) 06/06/2015  . S/P cardiac cath wjith normal coronary arteries  09/28/2014  . Sleep apnea    Past Surgical History:  Procedure Laterality Date  . COLONOSCOPY    . EP IMPLANTABLE DEVICE N/A 03/21/2015   Procedure: SubQ ICD Implant;  Surgeon: Deboraha Sprang, MD;  Location: [facility];  Service: Cardiovascular;  Laterality: N/A;  . LEFT AND RIGHT HEART CATHETERIZATION WITH CORONARY ANGIOGRAM N/A  09/27/2014   Procedure: LEFT AND RIGHT HEART CATHETERIZATION WITH CORONARY ANGIOGRAM;  Surgeon: Belva Crome, MD;  Location: [facility] CATH LAB;  Service: Cardiovascular;  Laterality: N/A;  . SUBQ ICD  03/21/2015    reports that he has never smoked. He has never used smokeless tobacco. He reports that he does not drink alcohol or use drugs. family history includes Aneurysm (age of onset: 79) in his sister; Diabetes in his father; Healthy in his brother, brother, and brother; Heart attack (age of onset: 72) in his father; Heart disease in his father; Hypertension in his sister; Lung cancer in his mother. No Known Allergies Current Outpatient Prescriptions on File Prior to Visit  Medication Sig Dispense Refill  . aspirin 81 MG EC tablet Take 1 tablet (81 mg total) by mouth daily. 90 tablet 3  . carvedilol (COREG) 25 MG tablet take 1 tablet by mouth twice a day with meals 180 tablet 3  . colchicine 0.6 MG tablet Take 1 tablet (0.6 mg total) by mouth daily. 90 tablet 3  . furosemide (LASIX) 40 MG tablet Take 1 tablet (40 mg total) by mouth daily. 90 tablet 3  . Multiple Vitamin (MULTIVITAMIN WITH MINERALS) TABS tablet Take 1 tablet by mouth daily.    . pantoprazole (PROTONIX) 40 MG tablet Take 1 tablet (40 mg total) by mouth daily. **PT NEEDS PHYSICAL FOR ADDITIONAL REFILLS** 90 tablet 0  . pravastatin (PRAVACHOL) 40 MG tablet Take 1 tablet (40 mg total) by mouth every evening. 90 tablet 3  . sacubitril-valsartan (  ENTRESTO) 97-103 MG Take 1 tablet by mouth 2 (two) times daily. 180 tablet 3  . spironolactone (ALDACTONE) 25 MG tablet Take 0.5 tablets (12.5 mg total) by mouth daily. 45 tablet 3  . traMADol (ULTRAM) 50 MG tablet Take 50 mg by mouth every 6 (six) hours as needed for moderate pain.     No current facility-administered medications on file prior to visit.     Review of Systems Constitutional: Negative for other unusual diaphoresis, sweats, appetite or weight changes HENT: Negative for other  worsening hearing loss, ear pain, facial swelling, mouth sores or neck stiffness.   Eyes: Negative for other worsening pain, redness or other visual disturbance.  Respiratory: Negative for other stridor or swelling Cardiovascular: Negative for other palpitations or other chest pain  Gastrointestinal: Negative for worsening diarrhea or loose stools, blood in stool, distention or other pain Genitourinary: Negative for hematuria, flank pain or other change in urine volume.  Musculoskeletal: Negative for myalgias or other joint swelling.  Skin: Negative for other color change, or other wound or worsening drainage.  Neurological: Negative for other syncope or numbness. Hematological: Negative for other adenopathy or swelling Psychiatric/Behavioral: Negative for hallucinations, other worsening agitation, SI, self-injury, or new decreased concentration All other system neg per pt    Objective:   Physical Exam BP 116/78   Pulse 62   Temp 98.6 F (37 C) (Oral)   Ht 5\' 10"  (1.778 m)   Wt 194 lb (88 kg)   SpO2 98%   BMI 27.84 kg/m  VS noted,  Constitutional: Pt is oriented to person, place, and time. Appears well-developed and well-nourished, in no significant distress and comfortable Head: Normocephalic and atraumatic  Eyes: Conjunctivae and EOM are normal. Pupils are equal, round, and reactive to light Right Ear: External ear normal without discharge Left Ear: External ear normal without discharge Nose: Nose without discharge or deformity Mouth/Throat: Oropharynx is without other ulcerations and moist  Neck: Normal range of motion. Neck supple. No JVD present. No tracheal deviation present or significant neck LA or mass Cardiovascular: Normal rate, regular rhythm, normal heart sounds and intact distal pulses.   Pulmonary/Chest: WOB normal and breath sounds without rales or wheezing  Abdominal: Soft. Bowel sounds are normal. NT. No HSM  Musculoskeletal: Normal range of motion. Exhibits no  edema Lymphadenopathy: Has no other cervical adenopathy.  Neurological: Pt is alert and oriented to person, place, and time. Pt has normal reflexes. No cranial nerve deficit. Motor grossly intact, Gait intact Skin: Skin is warm and dry. No rash noted or new ulcerations Psychiatric:  Has normal mood and affect. Behavior is normal without agitation No other exam findings Lab Results  Component Value Date   WBC 5.7 02/15/2017   HGB 15.5 02/15/2017   HCT 48.2 02/15/2017   PLT 124 (L) 02/15/2017   GLUCOSE 133 (H) 02/24/2017   CHOL 134 10/21/2016   TRIG 155 (H) 10/21/2016   HDL 35 (L) 10/21/2016   LDLDIRECT 105.8 06/18/2010   LDLCALC 68 10/21/2016   ALT 22 08/29/2015   AST 24 08/29/2015   NA 141 02/24/2017   K 4.8 02/24/2017   CL 103 02/24/2017   CREATININE 1.14 02/24/2017   BUN 11 02/24/2017   CO2 29 02/24/2017   TSH 1.89 08/29/2015   PSA 0.39 08/29/2015   INR 1.2 (H) 03/15/2015   HGBA1C 6.1 10/21/2016       Assessment & Plan:

## 2017-03-29 NOTE — Patient Instructions (Signed)
Please continue all other medications as before, and refills have been done if requested.  Please have the pharmacy call with any other refills you may need.  Please continue your efforts at being more active, low cholesterol diet, and weight control.  You are otherwise up to date with prevention measures today.  Please keep your appointments with your specialists as you may have planned  Please return in 6 months, or sooner if needed 

## 2017-03-30 ENCOUNTER — Other Ambulatory Visit: Payer: Self-pay | Admitting: Internal Medicine

## 2017-03-30 ENCOUNTER — Other Ambulatory Visit: Payer: Self-pay

## 2017-03-30 LAB — HIV ANTIBODY (ROUTINE TESTING W REFLEX): HIV 1&2 Ab, 4th Generation: NONREACTIVE

## 2017-03-30 MED ORDER — PANTOPRAZOLE SODIUM 40 MG PO TBEC: 40.0000 mg | DELAYED_RELEASE_TABLET | Freq: Every day | ORAL | 3 refills | Status: DC

## 2017-03-30 MED ORDER — ADULT MULTIVITAMIN W/MINERALS CH: 1.0000 | ORAL_TABLET | Freq: Every day | ORAL | 0 refills | Status: DC

## 2017-03-30 MED ORDER — CARVEDILOL 25 MG PO TABS: 25.0000 mg | ORAL_TABLET | Freq: Two times a day (BID) | ORAL | 3 refills | Status: DC

## 2017-03-30 NOTE — Progress Notes (Unsigned)
normj

## 2017-03-31 ENCOUNTER — Telehealth: Payer: Self-pay | Admitting: Cardiology

## 2017-03-31 NOTE — Telephone Encounter (Signed)
Mr.Feagan is calling about his Cpap Mask . Please call

## 2017-04-01 DIAGNOSIS — H524 Presbyopia: Secondary | ICD-10-CM | POA: Diagnosis not present

## 2017-04-01 NOTE — Telephone Encounter (Signed)
Patient states he was not able to get his P10 mask  from [facility] due tohis account being in collections.  Patient has asked me to try one of the other DME companies we use to try and get him the mask he needs. The CPAP assistant will check with [place] to try to help the patient get the supplies he needs.

## 2017-04-02 ENCOUNTER — Encounter: Payer: Self-pay | Admitting: Internal Medicine

## 2017-04-02 MED ORDER — CARVEDILOL 25 MG PO TABS: 25.0000 mg | ORAL_TABLET | Freq: Two times a day (BID) | ORAL | 3 refills | Status: DC

## 2017-04-05 NOTE — Telephone Encounter (Signed)
Patient called asking to be switched from [facility] to [facility] because he needs a new mask but he has an outstanding balance at [facility] and can not get supplies until he balance is paid. Patient agrees with treatment.

## 2017-04-05 NOTE — Addendum Note (Signed)
Addended by: Freada Bergeron on: 04/05/2017 04:40 PM   Modules accepted: Orders

## 2017-04-09 DIAGNOSIS — G4733 Obstructive sleep apnea (adult) (pediatric): Secondary | ICD-10-CM | POA: Diagnosis not present

## 2017-04-16 ENCOUNTER — Telehealth: Payer: Self-pay | Admitting: Cardiology

## 2017-04-16 NOTE — Telephone Encounter (Signed)
New message   Patient calling because  He feels his cpap mask is not fitting properly, unsure of he needs to change type of mask.  Patient already called [employer], waiting on return call from [place] at [place]. Please call  1) What problem are you experiencing? Mask not fitting properly  2) Who is your medical equipment company? [employer]   Please route to the sleep study assistant.

## 2017-04-19 NOTE — Telephone Encounter (Signed)
Follow up    Pt is calling back did not hear from anyone, he is unable to use CPAP machine due to mask not fitting

## 2017-04-19 NOTE — Telephone Encounter (Signed)
Reached out to the patient to offer assistance with getting him a new mask form [employer]. Spoke to [place] at [place] who will get a message to [place] [facility] to switch out the patient's mask for a better fit.

## 2017-05-19 ENCOUNTER — Telehealth: Payer: Self-pay | Admitting: Internal Medicine

## 2017-05-24 DIAGNOSIS — G4733 Obstructive sleep apnea (adult) (pediatric): Secondary | ICD-10-CM | POA: Diagnosis not present

## 2017-05-27 ENCOUNTER — Encounter: Payer: Self-pay | Admitting: Physician Assistant

## 2017-05-27 ENCOUNTER — Ambulatory Visit (INDEPENDENT_AMBULATORY_CARE_PROVIDER_SITE_OTHER): Payer: Medicare Other | Admitting: Physician Assistant

## 2017-05-27 VITALS — BP 122/80 | HR 70 | Ht 70.0 in | Wt 198.2 lb

## 2017-05-27 DIAGNOSIS — I5022 Chronic systolic (congestive) heart failure: Secondary | ICD-10-CM | POA: Diagnosis not present

## 2017-05-27 DIAGNOSIS — Z86010 Personal history of colonic polyps: Secondary | ICD-10-CM

## 2017-05-27 MED ORDER — NA SULFATE-K SULFATE-MG SULF 17.5-3.13-1.6 GM/177ML PO SOLN: 1.0000 | ORAL | 0 refills | Status: DC

## 2017-05-27 NOTE — Progress Notes (Signed)
I agree with the above note, plan 

## 2017-05-27 NOTE — Progress Notes (Signed)
 Chief Complaint: Consult for a screening colonoscopy, H/o polyps  HPI:    Matthew Guerrero is a 61 y/o AA male with a past medical history as listed below, including CHF with his last EF 25-30% on 02/01/17, who was referred to me by John, James W, MD for consult for a screening colonoscopy in a patient with heart failure.    Patient has followed with Dr. Jacobs in the past.  His last colonoscopy was completed 05/06/12 after a finding of 5 adenomatous polyps in 2010.  During the colonoscopy patient had finnding of very mild diverticulosis in the left colon and otherwise normal colon mucosa.  It was recommended that given his history of adenomatous polyps he would need a repeat in 5 years.   Today, the patient presents to clinic and expresses that he feels well. He tells me that his heart function has actually improved recently and he was told by his cardiologist that he was doing well. He is s/p defibrillator placement in 2016, but has had no further acute cardiac events. He tells me he is eager to do his colonoscopy. He denies any GI complaints.   Patient denies fever, chills, weight loss, fatigue, change in bowel habits, abdominal pain, heartburn or reflux.  Past Medical History:  Diagnosis Date  . ALLERGIC RHINITIS   . Asthma   . Cervical radiculitis   . CHF (congestive heart failure) ([facility])   . Chronic systolic heart failure ([facility]) 02/28/2015  . GERD (gastroesophageal reflux disease)   . GOUT   . Hyperglycemia 08/28/2014  . HYPERLIPIDEMIA   . HYPERTENSION   . Lateral epicondylitis of right elbow   . Nonischemic cardiomyopathy ([facility]) 09/28/2014  . Obesity (BMI 30-39.9) 06/06/2015  . S/P cardiac cath wjith normal coronary arteries  09/28/2014  . Sleep apnea     Past Surgical History:  Procedure Laterality Date  . COLONOSCOPY    . EP IMPLANTABLE DEVICE N/A 03/21/2015   Procedure: SubQ ICD Implant;  Surgeon: Steven C Klein, MD;  Location: [facility];  Service: Cardiovascular;  Laterality:  N/A;  . LEFT AND RIGHT HEART CATHETERIZATION WITH CORONARY ANGIOGRAM N/A 09/27/2014   Procedure: LEFT AND RIGHT HEART CATHETERIZATION WITH CORONARY ANGIOGRAM;  Surgeon: Henry W Smith, MD;  Location: [facility] CATH LAB;  Service: Cardiovascular;  Laterality: N/A;  . SUBQ ICD  03/21/2015    Current Outpatient Medications  Medication Sig Dispense Refill  . aspirin 81 MG EC tablet Take 1 tablet (81 mg total) by mouth daily. 90 tablet 3  . carvedilol (COREG) 25 MG tablet Take 1 tablet (25 mg total) by mouth 2 (two) times daily with a meal. 180 tablet 3  . colchicine 0.6 MG tablet Take 1 tablet (0.6 mg total) by mouth daily. 90 tablet 3  . furosemide (LASIX) 40 MG tablet Take 1 tablet (40 mg total) by mouth daily. 90 tablet 3  . Multiple Vitamin (MULTIVITAMIN WITH MINERALS) TABS tablet Take 1 tablet by mouth daily. 1 tablet 0  . pantoprazole (PROTONIX) 40 MG tablet Take 1 tablet (40 mg total) by mouth daily. 90 tablet 3  . pravastatin (PRAVACHOL) 40 MG tablet Take 1 tablet (40 mg total) by mouth every evening. 90 tablet 3  . sacubitril-valsartan (ENTRESTO) 97-103 MG Take 1 tablet by mouth 2 (two) times daily. 180 tablet 3  . spironolactone (ALDACTONE) 25 MG tablet Take 0.5 tablets (12.5 mg total) by mouth daily. 45 tablet 3  . traMADol (ULTRAM) 50 MG tablet Take 50 mg by   mouth every 6 (six) hours as needed for moderate pain.    . Na Sulfate-K Sulfate-Mg Sulf (SUPREP BOWEL PREP KIT) 17.5-3.13-1.6 GM/177ML SOLN Take 1 kit by mouth as directed. 324 mL 0   No current facility-administered medications for this visit.     Allergies as of 05/27/2017  . (No Known Allergies)    Family History  Problem Relation Age of Onset  . Lung cancer Mother   . Diabetes Father   . Heart disease Father   . Heart attack Father 95  . Hypertension Sister   . Aneurysm Sister 52       brain aneurysm  . Healthy Brother   . Healthy Brother   . Healthy Brother   . Colon cancer Neg Hx   . Esophageal cancer Neg Hx   .  Rectal cancer Neg Hx   . Stomach cancer Neg Hx     Social History   Socioeconomic History  . Marital status: Single    Spouse name: Not on file  . Number of children: Not on file  . Years of education: Not on file  . Highest education level: Not on file  Social Needs  . Financial resource strain: Not on file  . Food insecurity - worry: Not on file  . Food insecurity - inability: Not on file  . Transportation needs - medical: Not on file  . Transportation needs - non-medical: Not on file  Occupational History  . Not on file  Tobacco Use  . Smoking status: Never Smoker  . Smokeless tobacco: Never Used  Substance and Sexual Activity  . Alcohol use: No    Alcohol/week: 0.0 oz  . Drug use: No  . Sexual activity: Not on file  Other Topics Concern  . Not on file  Social History Narrative  . Not on file    Review of Systems:    Constitutional: No weight loss, fever or chills. Skin: No rash Cardiovascular: No chest pain  Respiratory: No SOB Gastrointestinal: See HPI and otherwise negative Genitourinary: No dysuria  Neurological: No headache Musculoskeletal: No new muscle or joint pain Hematologic: No bleeding  Psychiatric: No history of depression or anxiety   Physical Exam:  Vital signs: BP 122/80   Pulse 70   Ht 5' 10" (1.778 m)   Wt 198 lb 3.2 oz (89.9 kg)   SpO2 97%   BMI 28.44 kg/m   Constitutional:   Pleasant AA male appears to be in NAD, Well developed, Well nourished, alert and cooperative Head:  Normocephalic and atraumatic. Eyes:   PEERL, EOMI. No icterus. Conjunctiva pink. Ears:  Normal auditory acuity. Neck:  Supple Throat: Oral cavity and pharynx without inflammation, swelling or lesion.  Respiratory: Respirations even and unlabored. Lungs clear to auscultation bilaterally.   No wheezes, crackles, or rhonchi.  Cardiovascular: Normal S1, S2. No MRG. Regular rate and rhythm. No peripheral edema, cyanosis or pallor.  Gastrointestinal:  Soft,  nondistended, nontender. No rebound or guarding. Normal bowel sounds. No appreciable masses or hepatomegaly. Rectal:  Not performed.  Msk:  Symmetrical without gross deformities. Without edema, no deformity or joint abnormality.  Neurologic:  Alert and  oriented x4;  grossly normal neurologically.  Skin:   Dry and intact without significant lesions or rashes. Psychiatric: Demonstrates good judgement and reason without abnormal affect or behaviors.  RELEVANT LABS AND IMAGING: CBC    Component Value Date/Time   WBC 5.8 03/29/2017 1455   RBC 5.60 03/29/2017 1455   HGB 16.1 03/29/2017 1455  HGB 15.5 02/15/2017 0822   HCT 50.0 03/29/2017 1455   HCT 48.2 02/15/2017 0822   PLT 124.0 (L) 03/29/2017 1455   PLT 124 (L) 02/15/2017 0822   MCV 89.2 03/29/2017 1455   MCV 90.1 02/15/2017 0822   MCH 29.0 02/15/2017 0822   MCH 28.6 11/10/2016 0812   MCHC 32.1 03/29/2017 1455   RDW 13.9 03/29/2017 1455   RDW 14.1 02/15/2017 0822   LYMPHSABS 2.7 03/29/2017 1455   LYMPHSABS 2.4 02/15/2017 0822   MONOABS 0.4 03/29/2017 1455   MONOABS 0.5 02/15/2017 0822   EOSABS 0.1 03/29/2017 1455   EOSABS 0.1 02/15/2017 0822   BASOSABS 0.0 03/29/2017 1455   BASOSABS 0.0 02/15/2017 0822    CMP     Component Value Date/Time   NA 140 03/29/2017 1455   NA 141 02/24/2017 0948   K 3.9 03/29/2017 1455   CL 102 03/29/2017 1455   CO2 30 03/29/2017 1455   GLUCOSE 102 (H) 03/29/2017 1455   BUN 10 03/29/2017 1455   BUN 11 02/24/2017 0948   CREATININE 1.08 03/29/2017 1455   CREATININE 1.13 03/20/2016 1021   CALCIUM 9.6 03/29/2017 1455   PROT 7.3 03/29/2017 1455   ALBUMIN 4.8 03/29/2017 1455   AST 17 03/29/2017 1455   ALT 20 03/29/2017 1455   ALKPHOS 46 03/29/2017 1455   BILITOT 0.9 03/29/2017 1455   GFRNONAA 69 02/24/2017 0948   GFRAA 79 02/24/2017 0948    Assessment: 1.  History of adenomatous colon polyps: Colonoscopy in 2010 with 5 adenomatous polyps, repeat in 2013 was normal, repeat was recommended  in 5 years, patient is due now 2.  CHF: Last EF 25-30% in August of this year  Plan: 1.  Scheduled patient for a surveillance colonoscopy in the hospital with Dr. Jacobs.  Did discuss risks, benefits, limitations and alternatives and the patient agrees to proceed.  Discussed that this will be done in the hospital due to patient's decreased heart function. 2.  Patient to return to clinic per recommendations from Dr. Jacobs after time of procedure.  Jennifer Lemmon, PA-C [facility] 05/27/2017, 9:09 AM  Cc: John, James W, MD 

## 2017-05-27 NOTE — H&P (View-Only) (Signed)
 Chief Complaint: Consult for a screening colonoscopy, H/o polyps  HPI:    Matthew Guerrero is a 61 y/o AA male with a past medical history as listed below, including CHF with his last EF 25-30% on 02/01/17, who was referred to me by John, James W, MD for consult for a screening colonoscopy in a patient with heart failure.    Patient has followed with Dr. Jacobs in the past.  His last colonoscopy was completed 05/06/12 after a finding of 5 adenomatous polyps in 2010.  During the colonoscopy patient had finnding of very mild diverticulosis in the left colon and otherwise normal colon mucosa.  It was recommended that given his history of adenomatous polyps he would need a repeat in 5 years.   Today, the patient presents to clinic and expresses that he feels well. He tells me that his heart function has actually improved recently and he was told by his cardiologist that he was doing well. He is s/p defibrillator placement in 2016, but has had no further acute cardiac events. He tells me he is eager to do his colonoscopy. He denies any GI complaints.   Patient denies fever, chills, weight loss, fatigue, change in bowel habits, abdominal pain, heartburn or reflux.  Past Medical History:  Diagnosis Date  . ALLERGIC RHINITIS   . Asthma   . Cervical radiculitis   . CHF (congestive heart failure) (HCC)   . Chronic systolic heart failure (HCC) 02/28/2015  . GERD (gastroesophageal reflux disease)   . GOUT   . Hyperglycemia 08/28/2014  . HYPERLIPIDEMIA   . HYPERTENSION   . Lateral epicondylitis of right elbow   . Nonischemic cardiomyopathy (HCC) 09/28/2014  . Obesity (BMI 30-39.9) 06/06/2015  . S/P cardiac cath wjith normal coronary arteries  09/28/2014  . Sleep apnea     Past Surgical History:  Procedure Laterality Date  . COLONOSCOPY    . EP IMPLANTABLE DEVICE N/A 03/21/2015   Procedure: SubQ ICD Implant;  Surgeon: Steven C Klein, MD;  Location: [facility];  Service: Cardiovascular;  Laterality:  N/A;  . LEFT AND RIGHT HEART CATHETERIZATION WITH CORONARY ANGIOGRAM N/A 09/27/2014   Procedure: LEFT AND RIGHT HEART CATHETERIZATION WITH CORONARY ANGIOGRAM;  Surgeon: Henry W Smith, MD;  Location: [facility] CATH LAB;  Service: Cardiovascular;  Laterality: N/A;  . SUBQ ICD  03/21/2015    Current Outpatient Medications  Medication Sig Dispense Refill  . aspirin 81 MG EC tablet Take 1 tablet (81 mg total) by mouth daily. 90 tablet 3  . carvedilol (COREG) 25 MG tablet Take 1 tablet (25 mg total) by mouth 2 (two) times daily with a meal. 180 tablet 3  . colchicine 0.6 MG tablet Take 1 tablet (0.6 mg total) by mouth daily. 90 tablet 3  . furosemide (LASIX) 40 MG tablet Take 1 tablet (40 mg total) by mouth daily. 90 tablet 3  . Multiple Vitamin (MULTIVITAMIN WITH MINERALS) TABS tablet Take 1 tablet by mouth daily. 1 tablet 0  . pantoprazole (PROTONIX) 40 MG tablet Take 1 tablet (40 mg total) by mouth daily. 90 tablet 3  . pravastatin (PRAVACHOL) 40 MG tablet Take 1 tablet (40 mg total) by mouth every evening. 90 tablet 3  . sacubitril-valsartan (ENTRESTO) 97-103 MG Take 1 tablet by mouth 2 (two) times daily. 180 tablet 3  . spironolactone (ALDACTONE) 25 MG tablet Take 0.5 tablets (12.5 mg total) by mouth daily. 45 tablet 3  . traMADol (ULTRAM) 50 MG tablet Take 50 mg by   mouth every 6 (six) hours as needed for moderate pain.    . Na Sulfate-K Sulfate-Mg Sulf (SUPREP BOWEL PREP KIT) 17.5-3.13-1.6 GM/177ML SOLN Take 1 kit by mouth as directed. 324 mL 0   No current facility-administered medications for this visit.     Allergies as of 05/27/2017  . (No Known Allergies)    Family History  Problem Relation Age of Onset  . Lung cancer Mother   . Diabetes Father   . Heart disease Father   . Heart attack Father 73  . Hypertension Sister   . Aneurysm Sister 34       brain aneurysm  . Healthy Brother   . Healthy Brother   . Healthy Brother   . Colon cancer Neg Hx   . Esophageal cancer Neg Hx   .  Rectal cancer Neg Hx   . Stomach cancer Neg Hx     Social History   Socioeconomic History  . Marital status: Single    Spouse name: Not on file  . Number of children: Not on file  . Years of education: Not on file  . Highest education level: Not on file  Social Needs  . Financial resource strain: Not on file  . Food insecurity - worry: Not on file  . Food insecurity - inability: Not on file  . Transportation needs - medical: Not on file  . Transportation needs - non-medical: Not on file  Occupational History  . Not on file  Tobacco Use  . Smoking status: Never Smoker  . Smokeless tobacco: Never Used  Substance and Sexual Activity  . Alcohol use: No    Alcohol/week: 0.0 oz  . Drug use: No  . Sexual activity: Not on file  Other Topics Concern  . Not on file  Social History Narrative  . Not on file    Review of Systems:    Constitutional: No weight loss, fever or chills. Skin: No rash Cardiovascular: No chest pain  Respiratory: No SOB Gastrointestinal: See HPI and otherwise negative Genitourinary: No dysuria  Neurological: No headache Musculoskeletal: No new muscle or joint pain Hematologic: No bleeding  Psychiatric: No history of depression or anxiety   Physical Exam:  Vital signs: BP 122/80   Pulse 70   Ht 5' 10" (1.778 m)   Wt 198 lb 3.2 oz (89.9 kg)   SpO2 97%   BMI 28.44 kg/m   Constitutional:   Pleasant AA male appears to be in NAD, Well developed, Well nourished, alert and cooperative Head:  Normocephalic and atraumatic. Eyes:   PEERL, EOMI. No icterus. Conjunctiva pink. Ears:  Normal auditory acuity. Neck:  Supple Throat: Oral cavity and pharynx without inflammation, swelling or lesion.  Respiratory: Respirations even and unlabored. Lungs clear to auscultation bilaterally.   No wheezes, crackles, or rhonchi.  Cardiovascular: Normal S1, S2. No MRG. Regular rate and rhythm. No peripheral edema, cyanosis or pallor.  Gastrointestinal:  Soft,  nondistended, nontender. No rebound or guarding. Normal bowel sounds. No appreciable masses or hepatomegaly. Rectal:  Not performed.  Msk:  Symmetrical without gross deformities. Without edema, no deformity or joint abnormality.  Neurologic:  Alert and  oriented x4;  grossly normal neurologically.  Skin:   Dry and intact without significant lesions or rashes. Psychiatric: Demonstrates good judgement and reason without abnormal affect or behaviors.  RELEVANT LABS AND IMAGING: CBC    Component Value Date/Time   WBC 5.8 03/29/2017 1455   RBC 5.60 03/29/2017 1455   HGB 16.1 03/29/2017 1455     HGB 15.5 02/15/2017 0822   HCT 50.0 03/29/2017 1455   HCT 48.2 02/15/2017 0822   PLT 124.0 (L) 03/29/2017 1455   PLT 124 (L) 02/15/2017 0822   MCV 89.2 03/29/2017 1455   MCV 90.1 02/15/2017 0822   MCH 29.0 02/15/2017 0822   MCH 28.6 11/10/2016 0812   MCHC 32.1 03/29/2017 1455   RDW 13.9 03/29/2017 1455   RDW 14.1 02/15/2017 0822   LYMPHSABS 2.7 03/29/2017 1455   LYMPHSABS 2.4 02/15/2017 0822   MONOABS 0.4 03/29/2017 1455   MONOABS 0.5 02/15/2017 0822   EOSABS 0.1 03/29/2017 1455   EOSABS 0.1 02/15/2017 0822   BASOSABS 0.0 03/29/2017 1455   BASOSABS 0.0 02/15/2017 0822    CMP     Component Value Date/Time   NA 140 03/29/2017 1455   NA 141 02/24/2017 0948   K 3.9 03/29/2017 1455   CL 102 03/29/2017 1455   CO2 30 03/29/2017 1455   GLUCOSE 102 (H) 03/29/2017 1455   BUN 10 03/29/2017 1455   BUN 11 02/24/2017 0948   CREATININE 1.08 03/29/2017 1455   CREATININE 1.13 03/20/2016 1021   CALCIUM 9.6 03/29/2017 1455   PROT 7.3 03/29/2017 1455   ALBUMIN 4.8 03/29/2017 1455   AST 17 03/29/2017 1455   ALT 20 03/29/2017 1455   ALKPHOS 46 03/29/2017 1455   BILITOT 0.9 03/29/2017 1455   GFRNONAA 69 02/24/2017 0948   GFRAA 79 02/24/2017 0948    Assessment: 1.  History of adenomatous colon polyps: Colonoscopy in 2010 with 5 adenomatous polyps, repeat in 2013 was normal, repeat was recommended  in 5 years, patient is due now 2.  CHF: Last EF 25-30% in August of this year  Plan: 1.  Scheduled patient for a surveillance colonoscopy in the hospital with Dr. Jacobs.  Did discuss risks, benefits, limitations and alternatives and the patient agrees to proceed.  Discussed that this will be done in the hospital due to patient's decreased heart function. 2.  Patient to return to clinic per recommendations from Dr. Jacobs after time of procedure.  Matthew Crochet, PA-C [facility] 05/27/2017, 9:09 AM  Cc: John, James W, MD 

## 2017-05-27 NOTE — Patient Instructions (Signed)

## 2017-06-02 ENCOUNTER — Telehealth: Payer: Self-pay | Admitting: Internal Medicine

## 2017-06-02 NOTE — Telephone Encounter (Signed)
Matthew Guerrero is calling because he had a fall on yesterday 06/01/17 and wanted to know if anything showed up on his defibrillator. If you have any questions please call . Thanks

## 2017-06-02 NOTE — Telephone Encounter (Signed)
Matthew Guerrero fell on the ice yesterday and is concerned that he damaged his SICD. He sent a remote transmission while on the phone with me, no episodes, normal device function. He reports that the ICD site looks fine, I advised him to call back if he notices any swelling, drainage from the site. He verbalizes understanding.

## 2017-06-04 ENCOUNTER — Encounter (HOSPITAL_COMMUNITY): Payer: Self-pay | Admitting: Emergency Medicine

## 2017-06-04 ENCOUNTER — Other Ambulatory Visit: Payer: Self-pay

## 2017-06-08 ENCOUNTER — Encounter: Payer: Medicare Other | Admitting: Gastroenterology

## 2017-06-11 NOTE — Telephone Encounter (Signed)
Closed Encounter  °

## 2017-06-14 ENCOUNTER — Telehealth: Payer: Self-pay | Admitting: Gastroenterology

## 2017-06-14 NOTE — Telephone Encounter (Signed)
Patient notified he can hold his lasix and aldactone until after his procedure, but take his other medications that am with a sip of water.

## 2017-06-17 ENCOUNTER — Ambulatory Visit (HOSPITAL_COMMUNITY): Payer: Medicare Other | Admitting: Certified Registered Nurse Anesthetist

## 2017-06-17 ENCOUNTER — Other Ambulatory Visit: Payer: Self-pay

## 2017-06-17 ENCOUNTER — Encounter (HOSPITAL_COMMUNITY)
Admission: RE | Disposition: A | Discharge status: Home / Self Care | Payer: Self-pay | Source: Ambulatory Visit | Attending: Gastroenterology

## 2017-06-17 ENCOUNTER — Encounter (HOSPITAL_COMMUNITY): Payer: Self-pay | Admitting: Gastroenterology

## 2017-06-17 ENCOUNTER — Ambulatory Visit (HOSPITAL_COMMUNITY)
Admission: RE | Admit: 2017-06-17 | Discharge: 2017-06-17 | Disposition: A | Discharge status: Home / Self Care | Payer: Medicare Other | Source: Ambulatory Visit | Attending: Gastroenterology | Admitting: Gastroenterology

## 2017-06-17 DIAGNOSIS — I5022 Chronic systolic (congestive) heart failure: Secondary | ICD-10-CM | POA: Insufficient documentation

## 2017-06-17 DIAGNOSIS — Z6827 Body mass index (BMI) 27.0-27.9, adult: Secondary | ICD-10-CM | POA: Diagnosis not present

## 2017-06-17 DIAGNOSIS — I110 Hypertensive heart disease with heart failure: Secondary | ICD-10-CM | POA: Insufficient documentation

## 2017-06-17 DIAGNOSIS — M109 Gout, unspecified: Secondary | ICD-10-CM | POA: Diagnosis not present

## 2017-06-17 DIAGNOSIS — K219 Gastro-esophageal reflux disease without esophagitis: Secondary | ICD-10-CM | POA: Diagnosis not present

## 2017-06-17 DIAGNOSIS — E785 Hyperlipidemia, unspecified: Secondary | ICD-10-CM | POA: Diagnosis not present

## 2017-06-17 DIAGNOSIS — Z1211 Encounter for screening for malignant neoplasm of colon: Secondary | ICD-10-CM | POA: Insufficient documentation

## 2017-06-17 DIAGNOSIS — D124 Benign neoplasm of descending colon: Secondary | ICD-10-CM | POA: Diagnosis not present

## 2017-06-17 DIAGNOSIS — I10 Essential (primary) hypertension: Secondary | ICD-10-CM | POA: Diagnosis not present

## 2017-06-17 DIAGNOSIS — Z7982 Long term (current) use of aspirin: Secondary | ICD-10-CM | POA: Insufficient documentation

## 2017-06-17 DIAGNOSIS — Z86010 Personal history of colonic polyps: Secondary | ICD-10-CM

## 2017-06-17 DIAGNOSIS — D126 Benign neoplasm of colon, unspecified: Secondary | ICD-10-CM | POA: Diagnosis not present

## 2017-06-17 DIAGNOSIS — Z95810 Presence of automatic (implantable) cardiac defibrillator: Secondary | ICD-10-CM | POA: Insufficient documentation

## 2017-06-17 DIAGNOSIS — Z9989 Dependence on other enabling machines and devices: Secondary | ICD-10-CM | POA: Diagnosis not present

## 2017-06-17 DIAGNOSIS — K635 Polyp of colon: Secondary | ICD-10-CM | POA: Diagnosis not present

## 2017-06-17 DIAGNOSIS — G4730 Sleep apnea, unspecified: Secondary | ICD-10-CM | POA: Diagnosis not present

## 2017-06-17 DIAGNOSIS — I428 Other cardiomyopathies: Secondary | ICD-10-CM | POA: Insufficient documentation

## 2017-06-17 DIAGNOSIS — Z860101 Personal history of adenomatous and serrated colon polyps: Secondary | ICD-10-CM

## 2017-06-17 DIAGNOSIS — Z79899 Other long term (current) drug therapy: Secondary | ICD-10-CM | POA: Insufficient documentation

## 2017-06-17 DIAGNOSIS — D122 Benign neoplasm of ascending colon: Secondary | ICD-10-CM

## 2017-06-17 HISTORY — PX: COLONOSCOPY WITH PROPOFOL: SHX5780

## 2017-06-17 SURGERY — COLONOSCOPY WITH PROPOFOL
Anesthesia: Monitor Anesthesia Care

## 2017-06-17 MED ORDER — PROPOFOL 500 MG/50ML IV EMUL
INTRAVENOUS | Status: DC | PRN
  Administered 2017-06-17: 75 ug/kg/min via INTRAVENOUS

## 2017-06-17 MED ORDER — PROPOFOL 10 MG/ML IV BOLUS
INTRAVENOUS | Status: AC
  Filled 2017-06-17: qty 60

## 2017-06-17 MED ORDER — EPHEDRINE SULFATE-NACL 50-0.9 MG/10ML-% IV SOSY
PREFILLED_SYRINGE | INTRAVENOUS | Status: DC | PRN
  Administered 2017-06-17: 10 mg via INTRAVENOUS
  Administered 2017-06-17: 5 mg via INTRAVENOUS

## 2017-06-17 MED ORDER — LACTATED RINGERS IV SOLN
INTRAVENOUS | Status: DC
  Administered 2017-06-17: 08:00:00 via INTRAVENOUS

## 2017-06-17 MED ORDER — SODIUM CHLORIDE 0.9 % IV SOLN
INTRAVENOUS | Status: DC
Start: 2017-06-17 — End: 2017-06-17

## 2017-06-17 MED ORDER — LIDOCAINE 2% (20 MG/ML) 5 ML SYRINGE
INTRAMUSCULAR | Status: DC | PRN
  Administered 2017-06-17: 100 mg via INTRAVENOUS

## 2017-06-17 MED ORDER — ONDANSETRON HCL 4 MG/2ML IJ SOLN
INTRAMUSCULAR | Status: DC | PRN
  Administered 2017-06-17: 4 mg via INTRAVENOUS

## 2017-06-17 MED ORDER — PROPOFOL 500 MG/50ML IV EMUL
INTRAVENOUS | Status: DC | PRN
  Administered 2017-06-17: 20 mg via INTRAVENOUS
  Administered 2017-06-17: 10 mg via INTRAVENOUS
  Administered 2017-06-17: 20 mg via INTRAVENOUS

## 2017-06-17 SURGICAL SUPPLY — 22 items

## 2017-06-17 NOTE — Interval H&P Note (Signed)
History and Physical Interval Note:  06/17/2017 8:20 AM  Matthew Guerrero  has presented today for surgery, with the diagnosis of hx of colon polyps/db  The various methods of treatment have been discussed with the patient and family. After consideration of risks, benefits and other options for treatment, the patient has consented to  Procedure(s): COLONOSCOPY WITH PROPOFOL (N/A) as a surgical intervention .  The patient's history has been reviewed, patient examined, no change in status, stable for surgery.  I have reviewed the patient's chart and labs.  Questions were answered to the patient's satisfaction.     Milus Banister

## 2017-06-17 NOTE — Transfer of Care (Signed)
Immediate Anesthesia Transfer of Care Note  Patient: Matthew Guerrero  Procedure(s) Performed: COLONOSCOPY WITH PROPOFOL (N/A )  Patient Location: Endoscopy Unit  Anesthesia Type:MAC  Level of Consciousness: sedated and patient cooperative  Airway & Oxygen Therapy: Patient Spontanous Breathing and Patient connected to face mask oxygen  Post-op Assessment: Report given to RN and Post -op Vital signs reviewed and stable  Post vital signs: Reviewed and stable  Last Vitals:  Vitals:   06/17/17 0819  BP: 138/79  Pulse: 75  Resp: 18  Temp: 37.2 C  SpO2: 100%    Last Pain:  Vitals:   06/17/17 0819  TempSrc: Oral         Complications: No apparent anesthesia complications

## 2017-06-17 NOTE — Op Note (Signed)
[facility] Patient Name: Matthew Guerrero Procedure Date: 06/17/2017 MRN: [id number] Attending MD: Milus Banister , MD Date of Birth: 19-Nov-1955 CSN: [id number] Age: 61 Admit Type: Outpatient Procedure:                Colonoscopy Indications:              High risk colon cancer surveillance: Personal                            history of colonic polyps; colonoscopy 2010 5 subCM                            polyps removed, mixed HPs and TAs; colonoscopy 2013                            found no polyps Providers:                Milus Banister, MD, Cleda Daub, RN, William Dalton, Technician Referring MD:              Medicines:                Monitored Anesthesia Care Complications:            No immediate complications. Estimated blood loss:                            None. Estimated Blood Loss:     Estimated blood loss: none. Procedure:                Pre-Anesthesia Assessment:                           - Prior to the procedure, a History and Physical                            was performed, and patient medications and                            allergies were reviewed. The patient's tolerance of                            previous anesthesia was also reviewed. The risks                            and benefits of the procedure and the sedation                            options and risks were discussed with the patient.                            All questions were answered, and informed consent                            was obtained. Prior  Anticoagulants: The patient has                            taken no previous anticoagulant or antiplatelet                            agents. ASA Grade Assessment: IV - A patient with                            severe systemic disease that is a constant threat                            to life. After reviewing the risks and benefits,                            the patient was deemed in  satisfactory condition to                            undergo the procedure.                           After obtaining informed consent, the colonoscope                            was passed under direct vision. Throughout the                            procedure, the patient's blood pressure, pulse, and                            oxygen saturations were monitored continuously. The                            EC-[id number] ([id number]) scope was introduced through                            the anus and advanced to the the cecum, identified                            by appendiceal orifice and ileocecal valve. The                            colonoscopy was performed without difficulty. The                            patient tolerated the procedure well. The quality                            of the bowel preparation was excellent. The                            ileocecal valve, appendiceal orifice, and rectum  were photographed. Scope In: 8:54:26 AM Scope Out: 9:05:04 AM Scope Withdrawal Time: 0 hours 7 minutes 31 seconds  Total Procedure Duration: 0 hours 10 minutes 38 seconds  Findings:      Two sessile polyps were found in the descending colon and ascending       colon. The polyps were 3 to 4 mm in size. These polyps were removed with       a cold snare. Resection and retrieval were complete.      The exam was otherwise without abnormality on direct and retroflexion       views. Impression:               - Two 3 to 4 mm polyps in the descending colon and                            in the ascending colon, removed with a cold snare.                            Resected and retrieved.                           - The examination was otherwise normal on direct                            and retroflexion views. Moderate Sedation:      N/A- Per Anesthesia Care Recommendation:           - Patient has a contact number available for                            emergencies. The  signs and symptoms of potential                            delayed complications were discussed with the                            patient. Return to normal activities tomorrow.                            Written discharge instructions were provided to the                            patient.                           - Resume previous diet.                           - Continue present medications.                           You will receive a letter within 2-3 weeks with the                            pathology results and my final recommendations.  If the polyp(s) is proven to be 'pre-cancerous' on                            pathology, you will need repeat colonoscopy in 5                            years. Procedure Code(s):        --- Professional ---                           [phone], Colonoscopy, flexible; with removal of                            tumor(s), polyp(s), or other lesion(s) by snare                            technique Diagnosis Code(s):        --- Professional ---                           Z86.010, Personal history of colonic polyps                           D12.4, Benign neoplasm of descending colon                           D12.2, Benign neoplasm of ascending colon CPT copyright 2016 American Medical Association. All rights reserved. The codes documented in this report are preliminary and upon coder review may  be revised to meet current compliance requirements. Milus Banister, MD 06/17/2017 9:09:39 AM This report has been signed electronically. Number of Addenda: 0

## 2017-06-17 NOTE — Anesthesia Preprocedure Evaluation (Addendum)
Anesthesia Evaluation  Patient identified by MRN, date of birth, ID band Patient awake    Reviewed: Allergy & Precautions, NPO status , Patient's Chart, lab work & pertinent test results, reviewed documented beta blocker date and time   History of Anesthesia Complications Negative for: history of anesthetic complications  Airway Mallampati: II  TM Distance: >3 FB Neck ROM: Full    Dental  (+) Dental Advisory Given, Chipped   Pulmonary sleep apnea and Continuous Positive Airway Pressure Ventilation ,    breath sounds clear to auscultation       Cardiovascular hypertension, Pt. on medications and Pt. on home beta blockers (-) angina+CHF  (-) CAD + Cardiac Defibrillator  Rhythm:Regular Rate:Normal  8/18 ECHO: EF 25-30%, mild MR   Neuro/Psych negative neurological ROS     GI/Hepatic Neg liver ROS, GERD  Medicated and Controlled,  Endo/Other  Morbid obesity  Renal/GU Renal disease     Musculoskeletal  (+) Arthritis , gout   Abdominal   Peds  Hematology   Anesthesia Other Findings   Reproductive/Obstetrics                            Anesthesia Physical Anesthesia Plan  ASA: III  Anesthesia Plan: MAC   Post-op Pain Management:    Induction: Intravenous  PONV Risk Score and Plan: 1 and Treatment may vary due to age or medical condition  Airway Management Planned: Natural Airway and Nasal Cannula  Additional Equipment:   Intra-op Plan:   Post-operative Plan:   Informed Consent: I have reviewed the patients History and Physical, chart, labs and discussed the procedure including the risks, benefits and alternatives for the proposed anesthesia with the patient or authorized representative who has indicated his/her understanding and acceptance.   Dental advisory given  Plan Discussed with: CRNA and Surgeon  Anesthesia Plan Comments: (Plan routine monitors, MAC)         Anesthesia Quick Evaluation

## 2017-06-17 NOTE — Anesthesia Postprocedure Evaluation (Signed)
Anesthesia Post Note  Patient: Matthew Guerrero  Procedure(s) Performed: COLONOSCOPY WITH PROPOFOL (N/A )     Patient location during evaluation: Endoscopy Anesthesia Type: MAC Level of consciousness: awake and alert, oriented and patient cooperative Pain management: pain level controlled Vital Signs Assessment: post-procedure vital signs reviewed and stable Respiratory status: spontaneous breathing, nonlabored ventilation and respiratory function stable Cardiovascular status: blood pressure returned to baseline and stable Postop Assessment: no apparent nausea or vomiting Anesthetic complications: no    Last Vitals:  Vitals:   06/17/17 0921 06/17/17 0929  BP: 100/79 135/79  Pulse: 73 68  Resp: 19 17  Temp:    SpO2:      Last Pain:  Vitals:   06/17/17 0915  TempSrc: Oral                 Pearline Yerby,E. Yasheka Fossett

## 2017-06-17 NOTE — Discharge Instructions (Signed)

## 2017-06-18 ENCOUNTER — Encounter (HOSPITAL_COMMUNITY): Payer: Self-pay | Admitting: Gastroenterology

## 2017-06-23 ENCOUNTER — Ambulatory Visit (INDEPENDENT_AMBULATORY_CARE_PROVIDER_SITE_OTHER): Payer: Medicare Other | Admitting: *Deleted

## 2017-06-23 DIAGNOSIS — I428 Other cardiomyopathies: Secondary | ICD-10-CM | POA: Diagnosis not present

## 2017-06-24 NOTE — Progress Notes (Signed)
Remote ICD transmission.   

## 2017-06-25 ENCOUNTER — Encounter: Payer: Self-pay | Admitting: Cardiology

## 2017-07-08 LAB — CUP PACEART REMOTE DEVICE CHECK
Date Time Interrogation Session: 20190102125800
Implantable Lead Implant Date: 20160929
Implantable Lead Location: 753862
Implantable Lead Model: 3401
MDC IDC MSMT BATTERY REMAINING PERCENTAGE: 74 %
MDC IDC PG IMPLANT DT: 20160929
MDC IDC PG SERIAL: 118449

## 2017-07-15 ENCOUNTER — Telehealth: Payer: Self-pay | Admitting: *Deleted

## 2017-07-15 NOTE — Telephone Encounter (Signed)
Faxed [phone] last office note and diagnostic PSG to [employer], [place], [place] [address] for patient cpap supplies.

## 2017-07-20 ENCOUNTER — Telehealth: Payer: Self-pay | Admitting: Internal Medicine

## 2017-07-20 DIAGNOSIS — I10 Essential (primary) hypertension: Secondary | ICD-10-CM | POA: Diagnosis not present

## 2017-07-20 NOTE — Telephone Encounter (Signed)
Matthew Guerrero given the verbal ok.

## 2017-07-20 NOTE — Telephone Encounter (Signed)
Rose for verbal, ok for signing off when received, thanks

## 2017-07-20 NOTE — Telephone Encounter (Signed)
Copied from [place] [phone]. Topic: General - Other >> Jul 20, 2017 11:12 AM Carolyn Stare wrote:  Matthew Guerrero with [facility] call to say they faxed over an order for incontin supplies and is asking if the office received it. In the mean time she would like a verbal order until the doctor sign off  Pull ups in a 2x   [place]

## 2017-07-27 NOTE — Telephone Encounter (Signed)
Documents was faxed on 1/25

## 2017-07-27 NOTE — Telephone Encounter (Signed)
[facility] called and said that they need the ordered faxed back for future orders. Fax [phone]

## 2017-08-04 ENCOUNTER — Telehealth: Payer: Self-pay

## 2017-08-04 NOTE — Telephone Encounter (Signed)
This has been done already and faxed back.  Original documents were sent to scan.    Copied from [place]. Topic: Inquiry >> Aug 04, 2017  9:54 AM Pricilla Handler wrote: Reason for CRM: Elmyra Ricks from [facility] # [phone], Fax # [phone]) called wanting to inquire if a certificate of medical necessity, which is needing corrections, was received. Last fax was sent on January 29th, but has not received a response. Elmyra Ricks stated that she will fax the document again today for corrections to be completed.      Thank You!!!

## 2017-08-13 ENCOUNTER — Telehealth: Payer: Self-pay

## 2017-08-13 NOTE — Telephone Encounter (Signed)
This form has been filled out and faxed back twice.   Copied from [place] [phone]. Topic: Inquiry >> Aug 13, 2017 10:24 AM Pricilla Handler wrote: Reason for CRM: Enis Gash from [facility] called stating that they re-faxed a document to our office on 08/05/2017 that was supposed to be corrected and returned to [facility]. Enis Gash states that the document needed Questions 21 and 30 answered, as well as which code is primary and patient's height and current weight. Please call [phone]. Fax [phone]. Enis Gash states they need this document back ASAP.

## 2017-08-19 ENCOUNTER — Telehealth: Payer: Self-pay

## 2017-08-19 NOTE — Telephone Encounter (Signed)
Documents have been faxed again  Copied from [place] [phone]. Topic: Inquiry >> Aug 13, 2017 10:24 AM Pricilla Handler wrote: Reason for CRM: Enis Gash from [facility] called stating that they re-faxed a document to our office on 08/05/2017 that was supposed to be corrected and returned to [facility]. Enis Gash states that the document needed Questions 21 and 30 answered, as well as which code is primary and patient's height and current weight. Please call [phone]. Fax [phone]. Enis Gash states they need this document back ASAP.  >> Aug 19, 2017  1:36 PM Scherrie Gerlach wrote: Charlotte Crumb with [facility] states they need questions 21 through 30 answered. (ALL) Height and weight in question 19.  Also Check which IC 10 code is primary at the top of the page.  They have been waiting since 07/19/17. Pt needs in order to get his supplies  It was re sent 08/05/17 to the office ANY questions, please call them  Phone: [phone]  Fax: [phone] Pt is on the phone with the pt, he states he needs asap.

## 2017-08-24 ENCOUNTER — Ambulatory Visit (INDEPENDENT_AMBULATORY_CARE_PROVIDER_SITE_OTHER): Payer: Medicare Other | Admitting: Internal Medicine

## 2017-08-24 ENCOUNTER — Encounter: Payer: Self-pay | Admitting: Internal Medicine

## 2017-08-24 VITALS — BP 116/78 | HR 59 | Ht 70.0 in | Wt 197.0 lb

## 2017-08-24 DIAGNOSIS — I428 Other cardiomyopathies: Secondary | ICD-10-CM | POA: Diagnosis not present

## 2017-08-24 DIAGNOSIS — I5022 Chronic systolic (congestive) heart failure: Secondary | ICD-10-CM

## 2017-08-24 DIAGNOSIS — G4733 Obstructive sleep apnea (adult) (pediatric): Secondary | ICD-10-CM

## 2017-08-24 DIAGNOSIS — E785 Hyperlipidemia, unspecified: Secondary | ICD-10-CM | POA: Diagnosis not present

## 2017-08-24 DIAGNOSIS — Z95810 Presence of automatic (implantable) cardiac defibrillator: Secondary | ICD-10-CM

## 2017-08-24 MED ORDER — SACUBITRIL-VALSARTAN 97-103 MG PO TABS: 1.0000 | ORAL_TABLET | Freq: Two times a day (BID) | ORAL | 3 refills | Status: DC

## 2017-08-24 MED ORDER — FUROSEMIDE 40 MG PO TABS: 40.0000 mg | ORAL_TABLET | Freq: Every day | ORAL | 3 refills | Status: DC

## 2017-08-24 MED ORDER — PRAVASTATIN SODIUM 40 MG PO TABS: 40.0000 mg | ORAL_TABLET | Freq: Every evening | ORAL | 3 refills | Status: DC

## 2017-08-24 MED ORDER — CARVEDILOL 25 MG PO TABS: 25.0000 mg | ORAL_TABLET | Freq: Two times a day (BID) | ORAL | 3 refills | Status: DC

## 2017-08-24 MED ORDER — SPIRONOLACTONE 25 MG PO TABS: 12.5000 mg | ORAL_TABLET | Freq: Every day | ORAL | 3 refills | Status: DC

## 2017-08-24 NOTE — Patient Instructions (Signed)
Your physician wants you to follow-up in: 6 months with Dr. Hilty. You will receive a reminder letter in the mail two months in advance. If you don't receive a letter, please call our office to schedule the follow-up appointment.    

## 2017-08-24 NOTE — Progress Notes (Signed)
OFFICE NOTE  Chief Complaint:  No complaints  Primary Care Physician: Biagio Borg, MD  HPI:  Matthew Guerrero is a 62 y.o. male with a hx of HTN, HL, gout, asthma. He was seen in the emergency room in late March with abdominal pain and vomiting. ECG was abnormal and a fast scan demonstrated no pericardial effusion but global hypokinesis. Plan was to follow-up as an outpatient. However, he presented back to the emergency room with progressive shortness of breath and chest pain with exertion. Admitted 4/5-4/8 with acute systolic HF. EF was noted to be 15-20% on echo. Troponin was minimally elevated (0.14). He was diuresed. LHC was arranged and demonstrated normal coronary arteries. He was dx with NICM. Medications were adjusted for CHF and he returns for FU. Of note, ACE inhibitor was stopped and he was placed on Entresto.   Since DC, he is doing well. His breathing is improved. He is NYHA 2b. He denies orthopnea, PND, edema. He denies chest pain, syncope. Does admit to snoring and does take daytime naps. He has a non-productive cough.    Studies/Reports Reviewed Today:  Echo 09/27/14 - Mild LVH. EF 15% to 20%. Diffuse hypokinesis. There is akinesis of the entireanteroseptal myocardium. Grade 1 diastolic dysfunction. There is muscular trabeculation at apex of left ventricle (no obvious thrombus identified). Definity contrast used. - Mitral valve: There was mild regurgitation. - Left atrium: The atrium was moderately dilated. - Right ventricle: The cavity size was moderately dilated. Wall thickness was normal. - Right atrium: The atrium was mildly dilated.  LHC 09/27/14 The left main coronary artery is normal. The left anterior descending artery is normal. The left circumflex artery is normal. The right coronary artery is dominant and normal. EF: 15%.  [name] has an upcoming sleep study which should be helpful hopefully and may be contributing to his  erythrocytosis. I suspect the etiology of his heart failure is hypertensive heart disease. There remains to be seen whether he'll have improvement in LV function. He seems to be tolerating his medications at this point. He does get somewhat fatigued with walking up stairs and doing activities. I suspect a benefit from cardiac rehabilitation.  I saw [name] back in the office today. He is here for a limited echo follow-up which was performed on 02/08/2015. Unfortunately this shows the EF to to remain between 10 and 15%. You have his medication shows near optimal therapy as he is currently on aspirin, carvedilol, Lasix, and Entresto 49/51 mg. He seems to be tolerating the medications and has NYHA class II symptoms.  Matthew Guerrero returns today for follow-up. He recently had placement of an AICD for primary prevention of sudden cardiac death by Dr. Caryl Comes in 04-10-2023. This went well and since then he his not had any significant problems. He denies any device firings. He had one episode which was difficult to understand that happened in church but required some reprogramming. Weight is stable at 255 which was his weight in 2023-04-10. He continues to be compliant with CPAP and has an appointment to see Dr. Radford Pax for reevaluation in December. He is also seeing Dr. Caryl Comes back in January. He is currently applying for disability. It should be noted that he has class II symptoms and does get short of breath with moderate exertion and is unable to lift more than 10 pounds routinely. His previous job required more physical work than this.  10/25/2015  [name] returns today for follow-up. He's done exceedingly well.  He seems to be tolerating Entresto at the 49/50 one dose. His weight has been stable, in fact it is improved to 230 pounds. He denies any worsening shortness of breath. He currently has class 1-2 symptoms. He's had no AICD firings. He uses CPAP and is been compliant with that. He has a follow-up with  Dr. Radford Pax this summer. He also has an ICD clinic follow-up as well. He is requesting a handicap parking sticker today which I will provide given the significance of his cardiomyopathy.   07/27/2016  [name] was seen in the office for follow-up today. He continues to do very well. He is completely asymptomatic. Weight is now down further from 230 pounds to 217 pounds. He is on the middle dose of interest oh by hesitant to increase the dose higher due to his blood pressure. He is also on a high dose of carvedilol, spironolactone, aspirin and Lasix. He has had no indication of worsening lead impedance on his AICD remote checks. He denies any shocks. He continues to have class 1-2 symptoms. His last echo in August 2017 showed an EF of 15% without any significant improvement. He remains compliant with CPAP.  02/08/2017  [name] returns today for follow-up. He seems to be tolerating Entresto. Recently had an echo which showed improvement in LV function up from 15% to 25-30%. He does have an AICD in place. I've encouraged by the improvement in LV function which may be due to additional medical therapy. He's asymptomatic for most of the time with NYHA class 1-2 symptoms. Blood pressure is stable in the 120s over 70s. EKG shows a sinus rhythm with LVH and repolarization abnormality at 62-personally reviewed. Weight is been stable as well. He denies chest pain or worsening shortness of breath. He is compliant with his CPAP. He has an in office defibrillator check in September.  08/24/2017  [name] was seen today for follow-up.  He continues to do very well.  He is on high-dose Entresto.  He has had improvement in LV function up to 25-30% as of August 2018.  His AICD is followed by Dr. Caryl Comes and is working appropriately.  His weight has been stable.  He continues to have mostly class I to class II NYHA symptoms.  Blood pressure is at goal.  EKG is unchanged.  He is compliant with CPAP.  PMHx:  Past  Medical History:  Diagnosis Date  . ALLERGIC RHINITIS   . Asthma   . Cervical radiculitis   . CHF (congestive heart failure) ([place])   . Chronic systolic heart failure ([place]) 02/28/2015  . GERD (gastroesophageal reflux disease)   . GOUT   . Hyperglycemia 08/28/2014  . HYPERLIPIDEMIA   . HYPERTENSION   . Lateral epicondylitis of right elbow   . Nonischemic cardiomyopathy ([place]) 09/28/2014  . Obesity (BMI 30-39.9) 06/06/2015  . S/P cardiac cath wjith normal coronary arteries  09/28/2014  . Sleep apnea     Past Surgical History:  Procedure Laterality Date  . COLONOSCOPY    . COLONOSCOPY WITH PROPOFOL N/A 06/17/2017   Procedure: COLONOSCOPY WITH PROPOFOL;  Surgeon: Milus Banister, MD;  Location: [facility];  Service: Endoscopy;  Laterality: N/A;  . EP IMPLANTABLE DEVICE N/A 03/21/2015   Procedure: SubQ ICD Implant;  Surgeon: Deboraha Sprang, MD;  Location: [place] [facility];  Service: Cardiovascular;  Laterality: N/A;  . LEFT AND RIGHT HEART CATHETERIZATION WITH CORONARY ANGIOGRAM N/A 09/27/2014   Procedure: LEFT AND RIGHT HEART CATHETERIZATION WITH CORONARY  Cyril Loosen;  Surgeon: Belva Crome, MD;  Location: [facility] CATH LAB;  Service: Cardiovascular;  Laterality: N/A;  . SUBQ ICD  03/21/2015    FAMHx:  Family History  Problem Relation Age of Onset  . Lung cancer Mother   . Diabetes Father   . Heart disease Father   . Heart attack Father 20  . Hypertension Sister   . Aneurysm Sister 37       brain aneurysm  . Healthy Brother   . Healthy Brother   . Healthy Brother   . Colon cancer Neg Hx   . Esophageal cancer Neg Hx   . Rectal cancer Neg Hx   . Stomach cancer Neg Hx     SOCHx:   reports that  has never smoked. he has never used smokeless tobacco. He reports that he does not drink alcohol or use drugs.  ALLERGIES:  No Known Allergies  ROS: Pertinent items noted in HPI and remainder of comprehensive ROS otherwise negative.  HOME MEDS: Current Outpatient Medications  Medication  Sig Dispense Refill  . aspirin 81 MG EC tablet Take 1 tablet (81 mg total) by mouth daily. 90 tablet 3  . carvedilol (COREG) 25 MG tablet Take 1 tablet (25 mg total) by mouth 2 (two) times daily with a meal. 180 tablet 3  . colchicine 0.6 MG tablet Take 1 tablet (0.6 mg total) by mouth daily. 90 tablet 3  . furosemide (LASIX) 40 MG tablet Take 1 tablet (40 mg total) by mouth daily. 90 tablet 3  . pantoprazole (PROTONIX) 40 MG tablet Take 1 tablet (40 mg total) by mouth daily. 90 tablet 3  . pravastatin (PRAVACHOL) 40 MG tablet Take 1 tablet (40 mg total) by mouth every evening. 90 tablet 3  . sacubitril-valsartan (ENTRESTO) 97-103 MG Take 1 tablet by mouth 2 (two) times daily. 180 tablet 3  . spironolactone (ALDACTONE) 25 MG tablet Take 0.5 tablets (12.5 mg total) by mouth daily. 45 tablet 3   No current facility-administered medications for this visit.     LABS/IMAGING: No results found for this or any previous visit (from the past 48 hour(s)). No results found.  WEIGHTS: Wt Readings from Last 3 Encounters:  08/24/17 197 lb (89.4 kg)  06/17/17 192 lb (87.1 kg)  05/27/17 198 lb 3.2 oz (89.9 kg)    VITALS: BP 116/78   Pulse (!) 59   Ht 5\' 10"  (1.778 m)   Wt 197 lb (89.4 kg)   BMI 28.27 kg/m   EXAM: General appearance: alert and no distress Neck: no carotid bruit, no JVD and thyroid not enlarged, symmetric, no tenderness/mass/nodules Lungs: clear to auscultation bilaterally Heart: regular rate and rhythm, S1, S2 normal, no murmur, click, rub or gallop and AICD pocket is intact Abdomen: soft, non-tender; bowel sounds normal; no masses,  no organomegaly Extremities: extremities normal, atraumatic, no cyanosis or edema Pulses: 2+ and symmetric Skin: Skin color, texture, turgor normal. No rashes or lesions Neurologic: Grossly normal Psych: Pleasant  EKG: Sinus bradycardia 59, incomplete left bundle branch block, minimal voltage criteria for LVH-personally  reviewed  ASSESSMENT: 1. Nonischemic, probably hypertensive cardiomyopathy, EF 15% with normal coronaries-NYHA class I-II symptoms (EF improved to 25-30%-01/2017) 2. Hypertension-controlled 3. Dyslipidemia 4. OSA on CPAP 5. Gout 6. Status post AICD for primary prevention of sudden cardiac death  PLAN: 1.   [name] continues to do well without any new heart failure symptoms.  He had improved heart function by echo in August 2018.  We may  consider repeating an echo in the next 6-12 months.  He is compliant with CPAP, as his recent LDL cholesterol was 80.  Blood pressures well controlled.  He has an AICD which is working appropriately.  We will continue his current medications.  Plan to see him back in 6 months.  Pixie Casino, MD, [facility], [place] Director of the [facility]  Card[facility] Diplomate of the American Board of Clinical Lipidology Attending Cardiologist  Direct Dial: [phone]  Fax: [phone]  Website:  [email or web address] 08/24/2017, 8:31 AM

## 2017-08-24 NOTE — Addendum Note (Signed)
Addended by: Fidel Levy on: 08/24/2017 08:43 AM   Modules accepted: Orders

## 2017-08-25 ENCOUNTER — Telehealth: Payer: Self-pay | Admitting: Internal Medicine

## 2017-08-25 NOTE — Telephone Encounter (Signed)
Received signed [employer] Form back from Dr. Debara Pickett. Form has been faxed to [facility]. Spoke with patient. Patient is aware form has been faxed. Patient requested his copy by mail. Mailed to patient on 08/25/17. ab

## 2017-09-09 IMAGING — CR DG CHEST 2V
2 series · 2 of 2 positions shown · non-contrast
Comparison: 03/21/2015.

CLINICAL DATA: 60-year-old male with cough and mid chest pain for 5
days. Initial encounter.

EXAM:
CHEST  2 VIEW

[chest pa]
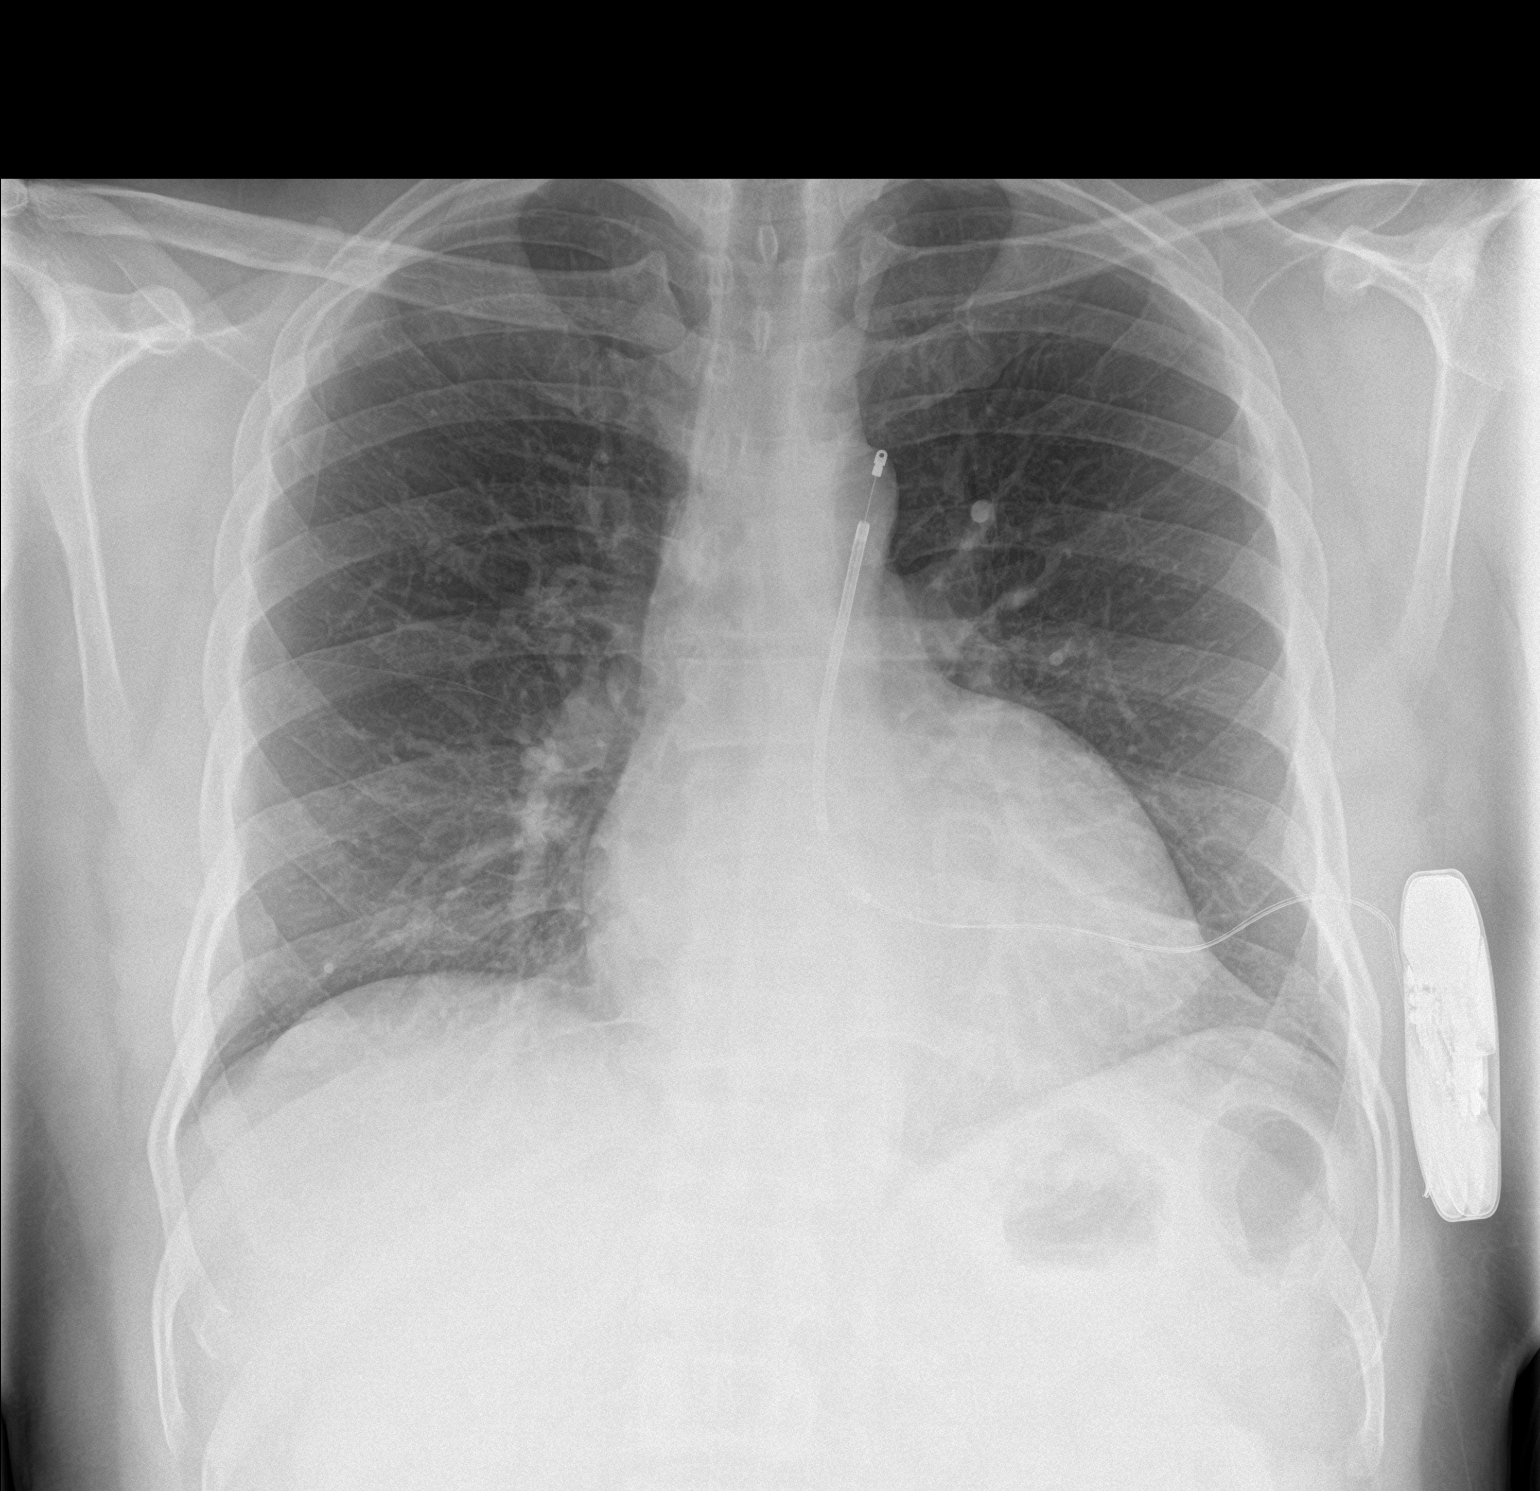

[chest lat]
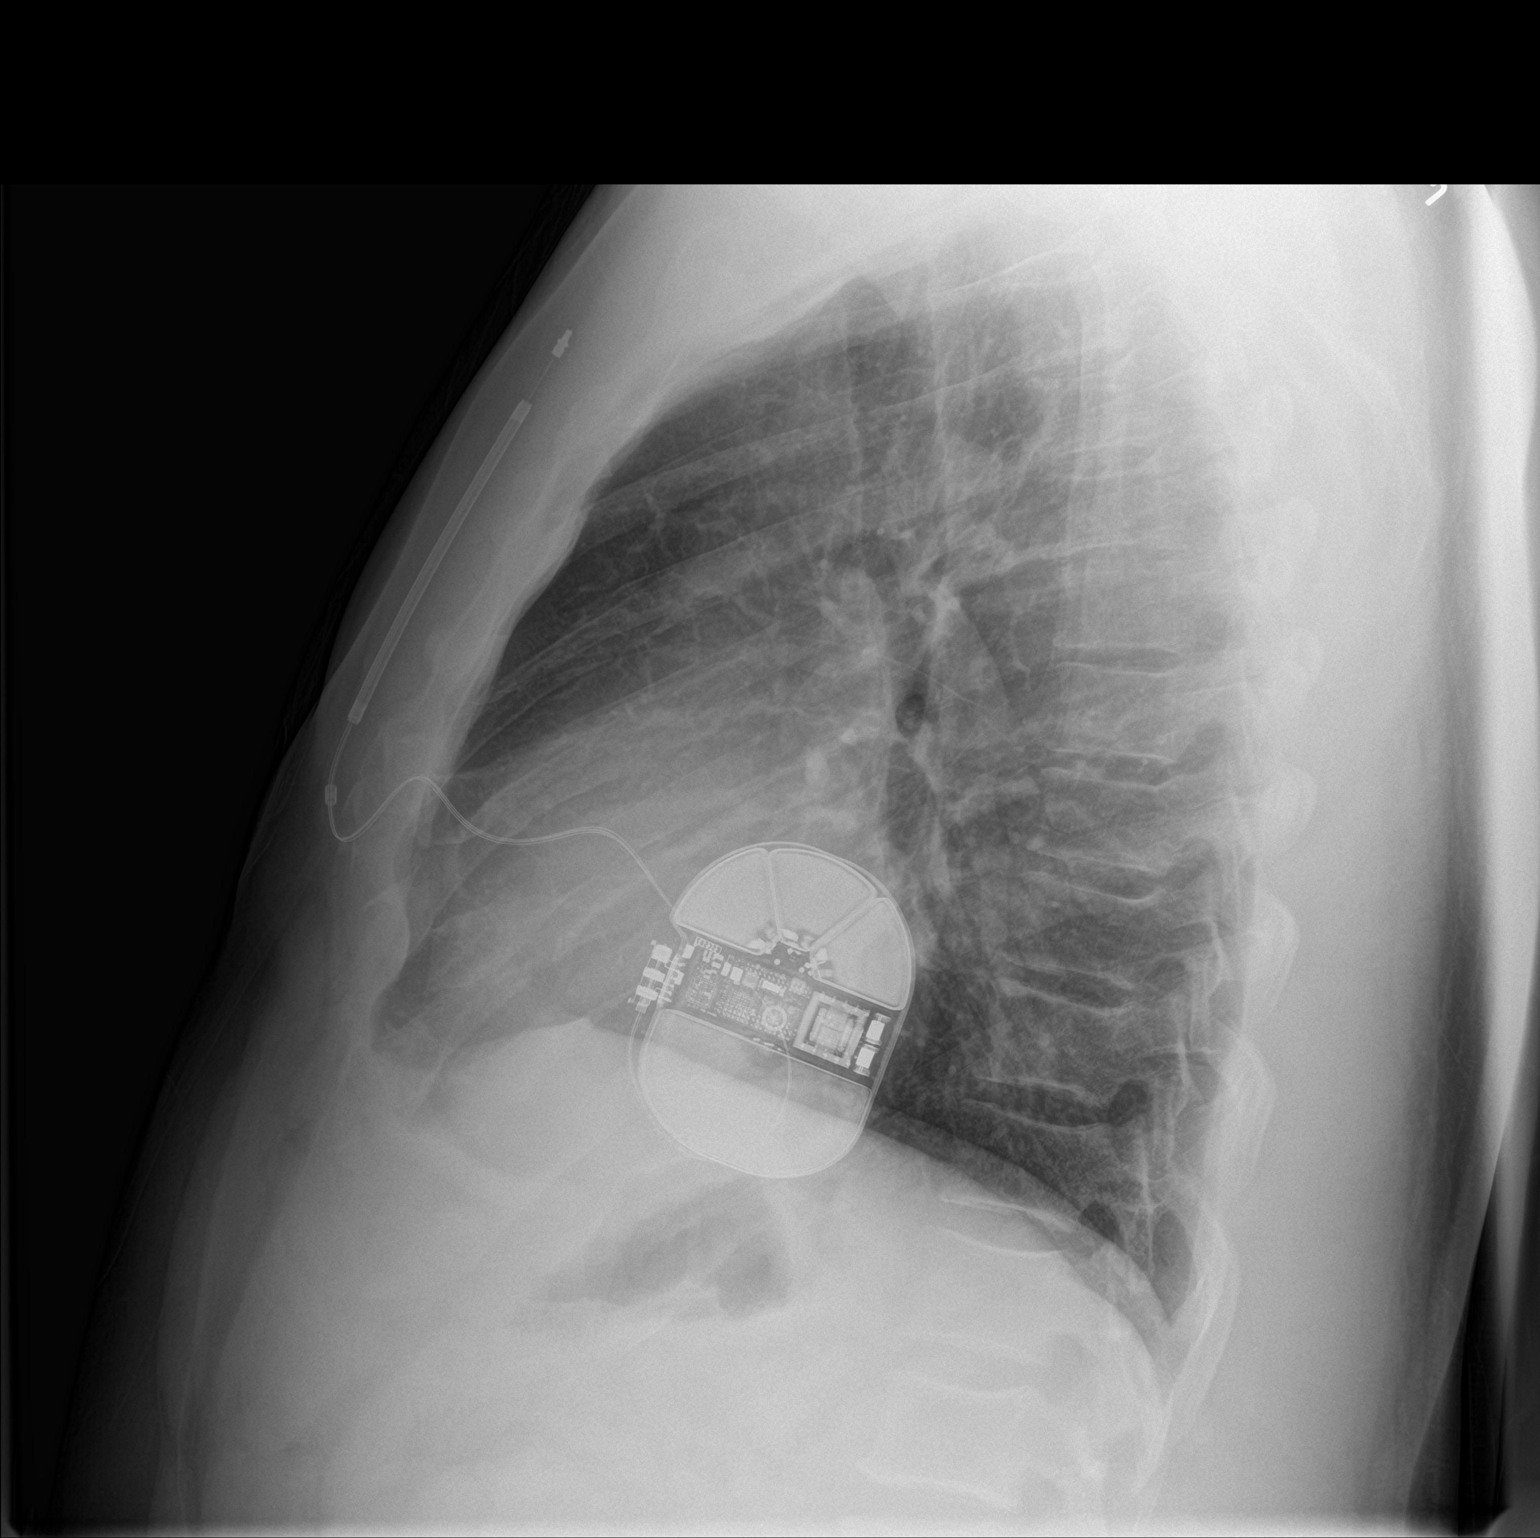

[2 of 2 positions shown; findings below may reference images not displayed]

FINDINGS: External defibrillator once again noted.

No infiltrate, congestive heart failure or pneumothorax.

Heart size within normal limits.

No plain film evidence of pulmonary malignancy.

Calcified aorta is minimally tortuous.

Mild scoliosis thoracic spine convex right.
IMPRESSION: No active cardiopulmonary disease.

## 2017-09-22 ENCOUNTER — Ambulatory Visit (INDEPENDENT_AMBULATORY_CARE_PROVIDER_SITE_OTHER): Payer: Medicare Other | Admitting: *Deleted

## 2017-09-22 DIAGNOSIS — I428 Other cardiomyopathies: Secondary | ICD-10-CM

## 2017-09-22 NOTE — Progress Notes (Signed)
Remote ICD transmission.   

## 2017-09-23 ENCOUNTER — Encounter: Payer: Self-pay | Admitting: Cardiology

## 2017-09-27 ENCOUNTER — Ambulatory Visit (INDEPENDENT_AMBULATORY_CARE_PROVIDER_SITE_OTHER): Payer: Medicare Other | Admitting: Internal Medicine

## 2017-09-27 ENCOUNTER — Encounter: Payer: Self-pay | Admitting: Internal Medicine

## 2017-09-27 VITALS — BP 128/88 | HR 65 | Temp 98.4°F | Ht 70.0 in | Wt 195.0 lb

## 2017-09-27 DIAGNOSIS — I10 Essential (primary) hypertension: Secondary | ICD-10-CM | POA: Diagnosis not present

## 2017-09-27 DIAGNOSIS — E785 Hyperlipidemia, unspecified: Secondary | ICD-10-CM

## 2017-09-27 DIAGNOSIS — Z0000 Encounter for general adult medical examination without abnormal findings: Secondary | ICD-10-CM

## 2017-09-27 DIAGNOSIS — R7303 Prediabetes: Secondary | ICD-10-CM

## 2017-09-27 MED ORDER — COLCHICINE 0.6 MG PO TABS: 0.6000 mg | ORAL_TABLET | Freq: Every day | ORAL | 3 refills | Status: DC

## 2017-09-27 NOTE — Patient Instructions (Signed)
Please continue all other medications as before, and refills have been done if requested.  Please have the pharmacy call with any other refills you may need.  Please continue your efforts at being more active, low cholesterol diet, and weight control.  You are otherwise up to date with prevention measures today.  Please keep your appointments with your specialists as you may have planned  Please return in 6 months, or sooner if needed, with Lab testing done 3-5 days before  

## 2017-09-27 NOTE — Progress Notes (Signed)
Subjective:    Patient ID: Matthew Guerrero, male    DOB: December 06, 1955, 62 y.o.   MRN: [id number]  HPI  Here to f/u; overall doing ok,  Pt denies chest pain, increasing sob or doe, wheezing, orthopnea, PND, increased LE swelling, palpitations, dizziness or syncope.  Pt denies new neurological symptoms such as new headache, or facial or extremity weakness or numbness.  Pt denies polydipsia, polyuria, or low sugar episode.  Pt states overall good compliance with meds, mostly trying to follow appropriate diet, with wt overall stable,  but little exercise however. Plans to start regular excercise Wt Readings from Last 3 Encounters:  09/27/17 195 lb (88.5 kg)  08/24/17 197 lb (89.4 kg)  06/17/17 192 lb (87.1 kg)   BP Readings from Last 3 Encounters:  09/27/17 128/88  08/24/17 116/78  06/17/17 135/79   Past Medical History:  Diagnosis Date  . ALLERGIC RHINITIS   . Asthma   . Cervical radiculitis   . CHF (congestive heart failure) ([place])   . Chronic systolic heart failure ([place]) 02/28/2015  . GERD (gastroesophageal reflux disease)   . GOUT   . Hyperglycemia 08/28/2014  . HYPERLIPIDEMIA   . HYPERTENSION   . Lateral epicondylitis of right elbow   . Nonischemic cardiomyopathy ([place]) 09/28/2014  . Obesity (BMI 30-39.9) 06/06/2015  . S/P cardiac cath wjith normal coronary arteries  09/28/2014  . Sleep apnea    Past Surgical History:  Procedure Laterality Date  . COLONOSCOPY    . COLONOSCOPY WITH PROPOFOL N/A 06/17/2017   Procedure: COLONOSCOPY WITH PROPOFOL;  Surgeon: Milus Banister, MD;  Location: [facility];  Service: Endoscopy;  Laterality: N/A;  . EP IMPLANTABLE DEVICE N/A 03/21/2015   Procedure: SubQ ICD Implant;  Surgeon: Deboraha Sprang, MD;  Location: [place] [facility] LAB;  Service: Cardiovascular;  Laterality: N/A;  . LEFT AND RIGHT HEART CATHETERIZATION WITH CORONARY ANGIOGRAM N/A 09/27/2014   Procedure: LEFT AND RIGHT HEART CATHETERIZATION WITH CORONARY ANGIOGRAM;  Surgeon: Belva Crome,  MD;  Location: [facility] CATH LAB;  Service: Cardiovascular;  Laterality: N/A;  . SUBQ ICD  03/21/2015    reports that he has never smoked. He has never used smokeless tobacco. He reports that he does not drink alcohol or use drugs. family history includes Aneurysm (age of onset: 88) in his sister; Diabetes in his father; Healthy in his brother, brother, and brother; Heart attack (age of onset: 21) in his father; Heart disease in his father; Hypertension in his sister; Lung cancer in his mother. No Known Allergies Current Outpatient Medications on File Prior to Visit  Medication Sig Dispense Refill  . aspirin 81 MG EC tablet Take 1 tablet (81 mg total) by mouth daily. 90 tablet 3  . carvedilol (COREG) 25 MG tablet Take 1 tablet (25 mg total) by mouth 2 (two) times daily with a meal. 180 tablet 3  . furosemide (LASIX) 40 MG tablet Take 1 tablet (40 mg total) by mouth daily. 90 tablet 3  . pantoprazole (PROTONIX) 40 MG tablet Take 1 tablet (40 mg total) by mouth daily. 90 tablet 3  . pravastatin (PRAVACHOL) 40 MG tablet Take 1 tablet (40 mg total) by mouth every evening. 90 tablet 3  . sacubitril-valsartan (ENTRESTO) 97-103 MG Take 1 tablet by mouth 2 (two) times daily. 180 tablet 3  . spironolactone (ALDACTONE) 25 MG tablet Take 0.5 tablets (12.5 mg total) by mouth daily. 45 tablet 3   No current facility-administered medications on file prior to visit.  Review of Systems  Constitutional: Negative for other unusual diaphoresis or sweats HENT: Negative for ear discharge or swelling Eyes: Negative for other worsening visual disturbances Respiratory: Negative for stridor or other swelling  Gastrointestinal: Negative for worsening distension or other blood Genitourinary: Negative for retention or other urinary change Musculoskeletal: Negative for other MSK pain or swelling Skin: Negative for color change or other new lesions Neurological: Negative for worsening tremors and other numbness    Psychiatric/Behavioral: Negative for worsening agitation or other fatigue All other system neg per pt    Objective:   Physical Exam BP 128/88   Pulse 65   Temp 98.4 F (36.9 C) (Oral)   Ht 5\' 10"  (1.778 m)   Wt 195 lb (88.5 kg)   SpO2 96%   BMI 27.98 kg/m  VS noted,  Constitutional: Pt appears in NAD HENT: Head: NCAT.  Right Ear: External ear normal.  Left Ear: External ear normal.  Eyes: . Pupils are equal, round, and reactive to light. Conjunctivae and EOM are normal Nose: without d/c or deformity Neck: Neck supple. Gross normal ROM Cardiovascular: Normal rate and regular rhythm.   Pulmonary/Chest: Effort normal and breath sounds without rales or wheezing.  Neurological: Pt is alert. At baseline orientation, motor grossly intact Skin: Skin is warm. No rashes, other new lesions, no LE edema Psychiatric: Pt behavior is normal without agitation  No other exam findings  Lab Results  Component Value Date   WBC 5.8 03/29/2017   HGB 16.1 03/29/2017   HCT 50.0 03/29/2017   PLT 124.0 (L) 03/29/2017   GLUCOSE 102 (H) 03/29/2017   CHOL 151 03/29/2017   TRIG 152.0 (H) 03/29/2017   HDL 40.80 03/29/2017   LDLDIRECT 105.8 06/18/2010   LDLCALC 80 03/29/2017   ALT 20 03/29/2017   AST 17 03/29/2017   NA 140 03/29/2017   K 3.9 03/29/2017   CL 102 03/29/2017   CREATININE 1.08 03/29/2017   BUN 10 03/29/2017   CO2 30 03/29/2017   TSH 1.18 03/29/2017   PSA 0.63 03/29/2017   INR 1.2 (H) 03/15/2015   HGBA1C 6.1 10/21/2016       Assessment & Plan:

## 2017-09-27 NOTE — Assessment & Plan Note (Signed)
Lab Results  Component Value Date   HGBA1C 6.1 10/21/2016   stable overall by history and exam, recent data reviewed with pt, and pt to continue medical treatment as before,  to f/u any worsening symptoms or concerns

## 2017-09-27 NOTE — Assessment & Plan Note (Signed)
Lab Results  Component Value Date   LDLCALC 80 03/29/2017  stable overall by history and exam, recent data reviewed with pt, and pt to continue medical treatment as before,  to f/u any worsening symptoms or concerns

## 2017-09-27 NOTE — Assessment & Plan Note (Signed)
stable overall by history and exam, recent data reviewed with pt, and pt to continue medical treatment as before,  to f/u any worsening symptoms or concerns BP Readings from Last 3 Encounters:  09/27/17 128/88  08/24/17 116/78  06/17/17 135/79

## 2017-10-06 DIAGNOSIS — G4733 Obstructive sleep apnea (adult) (pediatric): Secondary | ICD-10-CM | POA: Diagnosis not present

## 2017-10-12 LAB — CUP PACEART REMOTE DEVICE CHECK
Battery Remaining Percentage: 71 %
MDC IDC LEAD IMPLANT DT: 20160929
MDC IDC LEAD LOCATION: 753862
MDC IDC PG IMPLANT DT: 20160929
MDC IDC SESS DTM: 20190403112300
Pulse Gen Serial Number: 118449

## 2017-12-10 ENCOUNTER — Telehealth: Payer: Self-pay | Admitting: *Deleted

## 2017-12-10 ENCOUNTER — Encounter: Payer: Self-pay | Admitting: Cardiology

## 2017-12-10 ENCOUNTER — Ambulatory Visit (INDEPENDENT_AMBULATORY_CARE_PROVIDER_SITE_OTHER): Payer: Medicare Other | Admitting: Cardiology

## 2017-12-10 VITALS — BP 120/84 | HR 69 | Ht 70.0 in | Wt 200.0 lb

## 2017-12-10 DIAGNOSIS — I10 Essential (primary) hypertension: Secondary | ICD-10-CM | POA: Diagnosis not present

## 2017-12-10 DIAGNOSIS — G4733 Obstructive sleep apnea (adult) (pediatric): Secondary | ICD-10-CM

## 2017-12-10 NOTE — Progress Notes (Signed)
Cardiology Office Note:    Date:  12/10/2017   ID:  Matthew Guerrero, DOB 17-Feb-1956, MRN [id number]  PCP:  Biagio Borg, MD  Cardiologist:  No primary care provider on file.    Referring MD: Biagio Borg, MD   Chief Complaint  Patient presents with  . Sleep Apnea  . Hypertension    History of Present Illness:    Matthew Guerrero is a 62 y.o. male with a hx of moderate OSA with an AHI of 23/hr and mild to moderate snoring and associated nocturnal hypoxemia with his respiratory events as low as 71%. He is on 9 cm H2O. He is still having problems with being compliant with his device.  He says that he will wake up in the middle the night and his mouth is really really dry and then he will also feel like is not getting adequate air.  He uses a nasal pillow mask which he tolerates well but does not use a chinstrap.  He continues to feel rested in the am and has no significant daytime sleepiness.   He does not think that he snores.     Past Medical History:  Diagnosis Date  . ALLERGIC RHINITIS   . Asthma   . Cervical radiculitis   . CHF (congestive heart failure) ([place])   . Chronic systolic heart failure ([place]) 02/28/2015  . GERD (gastroesophageal reflux disease)   . GOUT   . Hyperglycemia 08/28/2014  . HYPERLIPIDEMIA   . HYPERTENSION   . Lateral epicondylitis of right elbow   . Nonischemic cardiomyopathy ([place]) 09/28/2014  . Obesity (BMI 30-39.9) 06/06/2015  . S/P cardiac cath wjith normal coronary arteries  09/28/2014  . Sleep apnea     Past Surgical History:  Procedure Laterality Date  . COLONOSCOPY    . COLONOSCOPY WITH PROPOFOL N/A 06/17/2017   Procedure: COLONOSCOPY WITH PROPOFOL;  Surgeon: Milus Banister, MD;  Location: [facility];  Service: Endoscopy;  Laterality: N/A;  . EP IMPLANTABLE DEVICE N/A 03/21/2015   Procedure: SubQ ICD Implant;  Surgeon: Deboraha Sprang, MD;  Location: [facility];  Service: Cardiovascular;  Laterality: N/A;  . LEFT AND RIGHT HEART  CATHETERIZATION WITH CORONARY ANGIOGRAM N/A 09/27/2014   Procedure: LEFT AND RIGHT HEART CATHETERIZATION WITH CORONARY ANGIOGRAM;  Surgeon: Belva Crome, MD;  Location: [facility] CATH LAB;  Service: Cardiovascular;  Laterality: N/A;  . SUBQ ICD  03/21/2015    Current Medications: Current Meds  Medication Sig  . aspirin 81 MG EC tablet Take 1 tablet (81 mg total) by mouth daily.  . carvedilol (COREG) 25 MG tablet Take 1 tablet (25 mg total) by mouth 2 (two) times daily with a meal.  . colchicine 0.6 MG tablet Take 1 tablet (0.6 mg total) by mouth daily.  . furosemide (LASIX) 40 MG tablet Take 1 tablet (40 mg total) by mouth daily.  . pantoprazole (PROTONIX) 40 MG tablet Take 1 tablet (40 mg total) by mouth daily.  . pravastatin (PRAVACHOL) 40 MG tablet Take 1 tablet (40 mg total) by mouth every evening.  . sacubitril-valsartan (ENTRESTO) 97-103 MG Take 1 tablet by mouth 2 (two) times daily.  [name] spironolactone (ALDACTONE) 25 MG tablet Take 0.5 tablets (12.5 mg total) by mouth daily.     Allergies:   Patient has no known allergies.   Social History   Socioeconomic History  . Marital status: Single    Spouse name: Not on file  . Number of children: Not on file  .  Years of education: Not on file  . Highest education level: Not on file  Occupational History  . Not on file  Social Needs  . Financial resource strain: Not on file  . Food insecurity:    Worry: Not on file    Inability: Not on file  . Transportation needs:    Medical: Not on file    Non-medical: Not on file  Tobacco Use  . Smoking status: Never Smoker  . Smokeless tobacco: Never Used  Substance and Sexual Activity  . Alcohol use: No    Alcohol/week: 0.0 oz  . Drug use: No  . Sexual activity: Not on file  Lifestyle  . Physical activity:    Days per week: Not on file    Minutes per session: Not on file  . Stress: Not on file  Relationships  . Social connections:    Talks on phone: Not on file    Gets together: Not on  file    Attends religious service: Not on file    Active member of club or organization: Not on file    Attends meetings of clubs or organizations: Not on file    Relationship status: Not on file  Other Topics Concern  . Not on file  Social History Narrative  . Not on file     Family History: The patient's family history includes Aneurysm (age of onset: 62) in his sister; Diabetes in his father; Healthy in his brother, brother, and brother; Heart attack (age of onset: 69) in his father; Heart disease in his father; Hypertension in his sister; Lung cancer in his mother. There is no history of Colon cancer, Esophageal cancer, Rectal cancer, or Stomach cancer.  ROS:   Please see the history of present illness.    ROS  All other systems reviewed and negative.   EKGs/Labs/Other Studies Reviewed:    The following studies were reviewed today: PAP download  EKG:  EKG is not ordered today.    Recent Labs: 03/29/2017: ALT 20; BUN 10; Creatinine, Ser 1.08; Hemoglobin 16.1; Platelets 124.0; Potassium 3.9; Sodium 140; TSH 1.18   Recent Lipid Panel    Component Value Date/Time   CHOL 151 03/29/2017 1455   CHOL 134 10/21/2016 1438   TRIG 152.0 (H) 03/29/2017 1455   HDL 40.80 03/29/2017 1455   HDL 35 (L) 10/21/2016 1438   CHOLHDL 4 03/29/2017 1455   VLDL 30.4 03/29/2017 1455   LDLCALC 80 03/29/2017 1455   LDLCALC 68 10/21/2016 1438   LDLDIRECT 105.8 06/18/2010 0806    Physical Exam:    VS:  BP 120/84   Pulse 69   Ht 5\' 10"  (1.778 m)   Wt 200 lb (90.7 kg)   SpO2 97%   BMI 28.70 kg/m     Wt Readings from Last 3 Encounters:  12/10/17 200 lb (90.7 kg)  09/27/17 195 lb (88.5 kg)  08/24/17 197 lb (89.4 kg)     GEN:  Well nourished, well developed in no acute distress HEENT: Normal NECK: No JVD; No carotid bruits LYMPHATICS: No lymphadenopathy CARDIAC: RRR, no murmurs, rubs, gallops RESPIRATORY:  Clear to auscultation without rales, wheezing or rhonchi  ABDOMEN: Soft,  non-tender, non-distended MUSCULOSKELETAL:  No edema; No deformity  SKIN: Warm and dry NEUROLOGIC:  Alert and oriented x 3 PSYCHIATRIC:  Normal affect   ASSESSMENT:    1. OSA (obstructive sleep apnea)   2. Essential hypertension   3. Obesity (BMI 30-39.9)    PLAN:    In  order of problems listed above:  1.  OSA - the patient is tolerating PAP therapy well without any problems. The PAP download was reviewed today and showed an AHI of 1.5/hr on 9 cm H2O with 10% compliance in using more than 4 hours nightly.  The patient has been using and benefiting from PAP use and will continue to benefit from therapy.  I have encouraged him to try to be more compliant with his device.  He is complete been complaining of mouth dryness and does not use a chinstrap.  I will order a chinstrap and then set his device on auto from 4 to 18 cm H2O.  I will check a download in 4 weeks.  2.  HTN - BP is well controlled on exam today.  He will continue on Entresto 97-23 mg twice daily, spironolactone 12.5 mg daily, carvedilol 25 mg twice daily.   Medication Adjustments/Labs and Tests Ordered: Current medicines are reviewed at length with the patient today.  Concerns regarding medicines are outlined above.  No orders of the defined types were placed in this encounter.  No orders of the defined types were placed in this encounter.   Signed, Fransico Him, MD  12/10/2017 10:19 AM    [place]

## 2017-12-10 NOTE — Telephone Encounter (Signed)
autotitration from 4-18 cm water pressure and D/L in 4 weeks from [facility]   ADD chin strap  Order placed to [facility] today

## 2017-12-10 NOTE — Telephone Encounter (Signed)
-----   Message from Teressa Senter, RN sent at 12/10/2017 10:27 AM EDT ----- Regarding: dme order  dme order placed   Thanks  [place]

## 2017-12-10 NOTE — Patient Instructions (Signed)
Medication Instructions:  Your physician recommends that you continue on your current medications as directed. Please refer to the Current Medication list given to you today.  If you need a refill on your cardiac medications, please contact your pharmacy first.  Labwork: None ordered   Testing/Procedures: None ordered   Follow-Up: Your physician wants you to follow-up in: 1 year with Dr. Radford Pax. You will receive a reminder letter in the mail two months in advance. If you don't receive a letter, please call our office to schedule the follow-up appointment.  Any Other Special Instructions Will Be Listed Below (If Applicable). CPAP at auto 4-18 cm water pressure and Dr. Radford Pax will get a 4 week download. Order placed for chin strap.   Thank you for choosing [facility], RN  [phone]  If you need a refill on your cardiac medications before your next appointment, please call your pharmacy.

## 2017-12-15 ENCOUNTER — Other Ambulatory Visit: Payer: Self-pay | Admitting: Internal Medicine

## 2017-12-22 ENCOUNTER — Ambulatory Visit (INDEPENDENT_AMBULATORY_CARE_PROVIDER_SITE_OTHER): Payer: Medicare Other | Admitting: *Deleted

## 2017-12-22 DIAGNOSIS — I428 Other cardiomyopathies: Secondary | ICD-10-CM | POA: Diagnosis not present

## 2017-12-22 DIAGNOSIS — I5022 Chronic systolic (congestive) heart failure: Secondary | ICD-10-CM

## 2017-12-22 NOTE — Progress Notes (Signed)
Remote ICD transmission.   

## 2017-12-22 NOTE — Telephone Encounter (Signed)
Patient called today to say he has not received his chin strap or the auto titration 4 to 18 from [facility]. I reached out to [facility] spoke to [place] (RT) who states patients machine is a standard cpap and can not be set to auto setting. I asked about a loaner machine for the patient and Jonni Sanger states he can only let the patient have the loaner for 1 week and then pull a download. I agreed and Jonni Sanger will call the patient to arrange. Patient notified.Pt is aware and agreeable to treatment.

## 2017-12-29 DIAGNOSIS — G4733 Obstructive sleep apnea (adult) (pediatric): Secondary | ICD-10-CM | POA: Diagnosis not present

## 2018-01-04 DIAGNOSIS — G4733 Obstructive sleep apnea (adult) (pediatric): Secondary | ICD-10-CM | POA: Diagnosis not present

## 2018-01-13 NOTE — Telephone Encounter (Signed)
Please get a download at the end of the week

## 2018-01-13 NOTE — Telephone Encounter (Signed)
Patient called to say he has received his chin strap and his auto ttitrating machine and he is doing well. He feels great and he feels rested in the mornings. His compliance report today shows an AHI of 0.7. Pt is aware and agreeable to treatment.

## 2018-01-19 LAB — CUP PACEART REMOTE DEVICE CHECK
Date Time Interrogation Session: 20190703110300
Implantable Lead Location: 753862
Implantable Lead Model: 3401
Implantable Pulse Generator Implant Date: 20160929
MDC IDC LEAD IMPLANT DT: 20160929
MDC IDC MSMT BATTERY REMAINING PERCENTAGE: 68 %
MDC IDC PG SERIAL: 118449

## 2018-01-25 NOTE — Telephone Encounter (Signed)
Download sent to be scanned for your viewing.

## 2018-01-27 ENCOUNTER — Telehealth: Payer: Self-pay | Admitting: *Deleted

## 2018-01-27 NOTE — Telephone Encounter (Signed)
-----   Message from Sueanne Margarita, MD sent at 01/27/2018  2:06 PM EDT ----- Good AHI on PAP but needs to improve compliance

## 2018-01-27 NOTE — Telephone Encounter (Signed)
Informed patient of compliance results and verbalized understanding was indicated. Patient is aware and agreeable to AHI being within range at 0.9. Patient is aware and agreeable to improving in compliance with machine usage.

## 2018-02-14 ENCOUNTER — Inpatient Hospital Stay: Payer: Medicare Other | Attending: Hematology and Oncology | Admitting: Hematology and Oncology

## 2018-02-14 ENCOUNTER — Inpatient Hospital Stay: Payer: Medicare Other

## 2018-02-14 ENCOUNTER — Telehealth: Payer: Self-pay | Admitting: Hematology and Oncology

## 2018-02-14 DIAGNOSIS — Z7982 Long term (current) use of aspirin: Secondary | ICD-10-CM | POA: Diagnosis not present

## 2018-02-14 DIAGNOSIS — Z79899 Other long term (current) drug therapy: Secondary | ICD-10-CM | POA: Diagnosis not present

## 2018-02-14 DIAGNOSIS — D751 Secondary polycythemia: Secondary | ICD-10-CM

## 2018-02-14 DIAGNOSIS — D696 Thrombocytopenia, unspecified: Secondary | ICD-10-CM | POA: Diagnosis not present

## 2018-02-14 LAB — CBC WITH DIFFERENTIAL/PLATELET
Basophils Absolute: 0 10*3/uL (ref 0.0–0.1)
Basophils Relative: 1 %
EOS ABS: 0.1 10*3/uL (ref 0.0–0.5)
Eosinophils Relative: 2 %
HCT: 47.2 % (ref 38.4–49.9)
Hemoglobin: 15.3 g/dL (ref 13.0–17.1)
LYMPHS ABS: 2.3 10*3/uL (ref 0.9–3.3)
Lymphocytes Relative: 36 %
MCH: 28.4 pg (ref 27.2–33.4)
MCHC: 32.4 g/dL (ref 32.0–36.0)
MCV: 87.7 fL (ref 79.3–98.0)
MONOS PCT: 7 %
Monocytes Absolute: 0.4 10*3/uL (ref 0.1–0.9)
Neutro Abs: 3.5 10*3/uL (ref 1.5–6.5)
Neutrophils Relative %: 54 %
PLATELETS: 124 10*3/uL — AB (ref 140–400)
RBC: 5.39 MIL/uL (ref 4.20–5.82)
RDW: 14 % (ref 11.0–14.6)
WBC: 6.3 10*3/uL (ref 4.0–10.3)

## 2018-02-14 NOTE — Assessment & Plan Note (Addendum)
Secondary Polycythemia: Negative JAK2 V617F mutation. Erythropoeitin 18.6 (mildly elevated) 10/26/14 Recommendation: 1. Most likely related to underlying cardiac disease 2. Phlebotomy is not the standard of care for secondary polycythemia. However if his Hb crosses 20, Or if he develops symptoms of congestion, we can consider doing it.  Lab review:  Thrombocytopenia: Suspicious for low grade ITP (Large platelets on smear suggest increased destruction). Today is 124.   Return to clinic in 1 yr

## 2018-02-14 NOTE — Addendum Note (Signed)
Addended by: Herbert Spires D on: 02/14/2018 07:52 AM   Modules accepted: Orders

## 2018-02-14 NOTE — Progress Notes (Signed)
Patient Care Team: Biagio Borg, MD as PCP - General (Internal Medicine)  DIAGNOSIS:  Encounter Diagnosis  Name Primary?  . Polycythemia, secondary    CHIEF COMPLIANT: Follow-up of secondary polycythemia and low-grade ITP  INTERVAL HISTORY: Matthew Guerrero is a 62 year old with above-mentioned history of secondary polycythemia and low-grade ITP who is here for annual follow-up he reports no major health issues or concerns.  He denies any abdominal pain nausea vomiting diarrhea or constipation.  Denies any blood in the stools.  Denies any bruising or bleeding.  REVIEW OF SYSTEMS:   Constitutional: Denies fevers, chills or abnormal weight loss Eyes: Denies blurriness of vision Ears, nose, mouth, throat, and face: Denies mucositis or sore throat Respiratory: Denies cough, dyspnea or wheezes Cardiovascular: Denies palpitation, chest discomfort Gastrointestinal:  Denies nausea, heartburn or change in bowel habits Skin: Denies abnormal skin rashes Lymphatics: Denies new lymphadenopathy or easy bruising Neurological:Denies numbness, tingling or new weaknesses Behavioral/Psych: Mood is stable, no new changes  Extremities: No lower extremity edema   All other systems were reviewed with the patient and are negative.  I have reviewed the past medical history, past surgical history, social history and family history with the patient and they are unchanged from previous note.  ALLERGIES:  has No Known Allergies.  MEDICATIONS:  Current Outpatient Medications  Medication Sig Dispense Refill  . aspirin 81 MG EC tablet Take 1 tablet (81 mg total) by mouth daily. 90 tablet 3  . carvedilol (COREG) 25 MG tablet Take 1 tablet (25 mg total) by mouth 2 (two) times daily with a meal. 180 tablet 3  . colchicine 0.6 MG tablet Take 1 tablet (0.6 mg total) by mouth daily. 90 tablet 3  . furosemide (LASIX) 40 MG tablet Take 1 tablet (40 mg total) by mouth daily. 90 tablet 3  . pantoprazole (PROTONIX)  40 MG tablet Take 1 tablet (40 mg total) by mouth daily. 90 tablet 3  . pantoprazole (PROTONIX) 40 MG tablet TAKE 1 TABLET BY MOUTH  DAILY 90 tablet 3  . pravastatin (PRAVACHOL) 40 MG tablet Take 1 tablet (40 mg total) by mouth every evening. 90 tablet 3  . sacubitril-valsartan (ENTRESTO) 97-103 MG Take 1 tablet by mouth 2 (two) times daily. 180 tablet 3  . spironolactone (ALDACTONE) 25 MG tablet Take 0.5 tablets (12.5 mg total) by mouth daily. 45 tablet 3   No current facility-administered medications for this visit.     PHYSICAL EXAMINATION: ECOG PERFORMANCE STATUS: 1 - Symptomatic but completely ambulatory  Vitals:   02/14/18 0814  BP: 138/86  Pulse: 69  Resp: 18  Temp: 97.8 F (36.6 C)  SpO2: 98%   Filed Weights   02/14/18 0814  Weight: 200 lb 6.4 oz (90.9 kg)    GENERAL:alert, no distress and comfortable SKIN: skin color, texture, turgor are normal, no rashes or significant lesions EYES: normal, Conjunctiva are pink and non-injected, sclera clear OROPHARYNX:no exudate, no erythema and lips, buccal mucosa, and tongue normal  NECK: supple, thyroid normal size, non-tender, without nodularity LYMPH:  no palpable lymphadenopathy in the cervical, axillary or inguinal LUNGS: clear to auscultation and percussion with normal breathing effort HEART: regular rate & rhythm and no murmurs and no lower extremity edema ABDOMEN:abdomen soft, non-tender and normal bowel sounds MUSCULOSKELETAL:no cyanosis of digits and no clubbing  NEURO: alert & oriented x 3 with fluent speech, no focal motor/sensory deficits EXTREMITIES: No lower extremity edema  LABORATORY DATA:  I have reviewed the data as listed CMP Latest  Ref Rng & Units 03/29/2017 02/24/2017 11/10/2016  Glucose 70 - 99 mg/dL 102(H) 133(H) 117(H)  BUN 6 - 23 mg/dL 10 11 7   Creatinine 0.40 - 1.50 mg/dL 1.08 1.14 1.05  Sodium 135 - 145 mEq/L 140 141 139  Potassium 3.5 - 5.1 mEq/L 3.9 4.8 3.5  Chloride 96 - 112 mEq/L 102 103 104    CO2 19 - 32 mEq/L 30 29 28   Calcium 8.4 - 10.5 mg/dL 9.6 10.2 9.3  Total Protein 6.0 - 8.3 g/dL 7.3 - -  Total Bilirubin 0.2 - 1.2 mg/dL 0.9 - -  Alkaline Phos 39 - 117 U/L 46 - -  AST 0 - 37 U/L 17 - -  ALT 0 - 53 U/L 20 - -    Lab Results  Component Value Date   WBC 6.3 02/14/2018   HGB 15.3 02/14/2018   HCT 47.2 02/14/2018   MCV 87.7 02/14/2018   PLT 124 (L) 02/14/2018   NEUTROABS 3.5 02/14/2018    ASSESSMENT & PLAN:  Polycythemia, secondary Secondary Polycythemia: Negative JAK2 V617F mutation. Erythropoeitin 18.6 (mildly elevated) 10/26/14 Recommendation: 1. Most likely related to underlying cardiac disease 2. Phlebotomy is not the standard of care for secondary polycythemia. However if his Hb crosses 20, Or if he develops symptoms of congestion, we can consider doing it.  Lab review: Hemoglobin 15.3, platelet count 124  Thrombocytopenia: Suspicious for low grade ITP (Large platelets on smear suggest increased destruction). Today is 124.   Return to clinic in 1 yr.  If the next year's labs look stable then we may see the patient on an as-needed basis.   No orders of the defined types were placed in this encounter.  The patient has a good understanding of the overall plan. he agrees with it. he will call with any problems that may develop before the next visit here.   Harriette Ohara, MD 02/14/18

## 2018-02-14 NOTE — Telephone Encounter (Signed)
Gave avs and calendar ° °

## 2018-03-07 ENCOUNTER — Ambulatory Visit (INDEPENDENT_AMBULATORY_CARE_PROVIDER_SITE_OTHER): Payer: Medicare Other | Admitting: Internal Medicine

## 2018-03-07 ENCOUNTER — Ambulatory Visit: Payer: Medicare Other | Admitting: Internal Medicine

## 2018-03-07 ENCOUNTER — Encounter: Payer: Self-pay | Admitting: Internal Medicine

## 2018-03-07 VITALS — BP 116/82 | HR 63 | Ht 70.0 in | Wt 197.4 lb

## 2018-03-07 DIAGNOSIS — I5022 Chronic systolic (congestive) heart failure: Secondary | ICD-10-CM

## 2018-03-07 DIAGNOSIS — I10 Essential (primary) hypertension: Secondary | ICD-10-CM | POA: Diagnosis not present

## 2018-03-07 DIAGNOSIS — G4733 Obstructive sleep apnea (adult) (pediatric): Secondary | ICD-10-CM

## 2018-03-07 DIAGNOSIS — I428 Other cardiomyopathies: Secondary | ICD-10-CM | POA: Diagnosis not present

## 2018-03-07 NOTE — Patient Instructions (Signed)
Your physician has requested that you have an echocardiogram. Echocardiography is a painless test that uses sound waves to create images of your heart. It provides your doctor with information about the size and shape of your heart and how well your heart's chambers and valves are working. This procedure takes approximately one hour. There are no restrictions for this procedure. -- done at [address] wants you to follow-up in: 6 months with Dr. Debara Pickett. You will receive a reminder letter in the mail two months in advance. If you don't receive a letter, please call our office to schedule the follow-up appointment.

## 2018-03-07 NOTE — Progress Notes (Signed)
OFFICE NOTE  Chief Complaint:  No complaints  Primary Care Physician: Biagio Borg, MD  HPI:  Matthew Guerrero is a 62 y.o. male with a hx of HTN, HL, gout, asthma. He was seen in the emergency room in late March with abdominal pain and vomiting. ECG was abnormal and a fast scan demonstrated no pericardial effusion but global hypokinesis. Plan was to follow-up as an outpatient. However, he presented back to the emergency room with progressive shortness of breath and chest pain with exertion. Admitted 4/5-4/8 with acute systolic HF. EF was noted to be 15-20% on echo. Troponin was minimally elevated (0.14). He was diuresed. LHC was arranged and demonstrated normal coronary arteries. He was dx with NICM. Medications were adjusted for CHF and he returns for FU. Of note, ACE inhibitor was stopped and he was placed on Entresto.   Since DC, he is doing well. His breathing is improved. He is NYHA 2b. He denies orthopnea, PND, edema. He denies chest pain, syncope. Does admit to snoring and does take daytime naps. He has a non-productive cough.    Studies/Reports Reviewed Today:  Echo 09/27/14 - Mild LVH. EF 15% to 20%. Diffuse hypokinesis. There is akinesis of the entireanteroseptal myocardium. Grade 1 diastolic dysfunction. There is muscular trabeculation at apex of left ventricle (no obvious thrombus identified). Definity contrast used. - Mitral valve: There was mild regurgitation. - Left atrium: The atrium was moderately dilated. - Right ventricle: The cavity size was moderately dilated. Wall thickness was normal. - Right atrium: The atrium was mildly dilated.  LHC 09/27/14 The left main coronary artery is normal. The left anterior descending artery is normal. The left circumflex artery is normal. The right coronary artery is dominant and normal. EF: 15%.  [name] has an upcoming sleep study which should be helpful hopefully and may be contributing to his  erythrocytosis. I suspect the etiology of his heart failure is hypertensive heart disease. There remains to be seen whether he'll have improvement in LV function. He seems to be tolerating his medications at this point. He does get somewhat fatigued with walking up stairs and doing activities. I suspect a benefit from cardiac rehabilitation.  I saw Matthew Guerrero back in the office today. He is here for a limited echo follow-up which was performed on 02/08/2015. Unfortunately this shows the EF to to remain between 10 and 15%. You have his medication shows near optimal therapy as he is currently on aspirin, carvedilol, Lasix, and Entresto 49/51 mg. He seems to be tolerating the medications and has NYHA class II symptoms.  Matthew Guerrero returns today for follow-up. He recently had placement of an AICD for primary prevention of sudden cardiac death by Dr. Caryl Comes in 04-10-2023. This went well and since then he his not had any significant problems. He denies any device firings. He had one episode which was difficult to understand that happened in church but required some reprogramming. Weight is stable at 255 which was his weight in 2023-04-10. He continues to be compliant with CPAP and has an appointment to see Dr. Radford Pax for reevaluation in December. He is also seeing Dr. Caryl Comes back in January. He is currently applying for disability. It should be noted that he has class II symptoms and does get short of breath with moderate exertion and is unable to lift more than 10 pounds routinely. His previous job required more physical work than this.  10/25/2015  Matthew Guerrero returns today for follow-up. He's done exceedingly well.  He seems to be tolerating Entresto at the 49/50 one dose. His weight has been stable, in fact it is improved to 230 pounds. He denies any worsening shortness of breath. He currently has class 1-2 symptoms. He's had no AICD firings. He uses CPAP and is been compliant with that. He has a follow-up with  Dr. Radford Pax this summer. He also has an ICD clinic follow-up as well. He is requesting a handicap parking sticker today which I will provide given the significance of his cardiomyopathy.   07/27/2016  Matthew Guerrero was seen in the office for follow-up today. He continues to do very well. He is completely asymptomatic. Weight is now down further from 230 pounds to 217 pounds. He is on the middle dose of interest oh by hesitant to increase the dose higher due to his blood pressure. He is also on a high dose of carvedilol, spironolactone, aspirin and Lasix. He has had no indication of worsening lead impedance on his AICD remote checks. He denies any shocks. He continues to have class 1-2 symptoms. His last echo in August 2017 showed an EF of 15% without any significant improvement. He remains compliant with CPAP.  02/08/2017  Matthew Guerrero returns today for follow-up. He seems to be tolerating Entresto. Recently had an echo which showed improvement in LV function up from 15% to 25-30%. He does have an AICD in place. I've encouraged by the improvement in LV function which may be due to additional medical therapy. He's asymptomatic for most of the time with NYHA class 1-2 symptoms. Blood pressure is stable in the 120s over 70s. EKG shows a sinus rhythm with LVH and repolarization abnormality at 62-personally reviewed. Weight is been stable as well. He denies chest pain or worsening shortness of breath. He is compliant with his CPAP. He has an in office defibrillator check in September.  08/24/2017  Matthew Guerrero was seen today for follow-up.  He continues to do very well.  He is on high-dose Entresto.  He has had improvement in LV function up to 25-30% as of August 2018.  His AICD is followed by Dr. Caryl Comes and is working appropriately.  His weight has been stable.  He continues to have mostly class I to class II NYHA symptoms.  Blood pressure is at goal.  EKG is unchanged.  He is compliant with CPAP.  03/07/2018  [name] is seen today in follow-up.  Overall he is doing well on his medications endorses NYHA class I symptoms.  He did have some interval improvement in LVEF up to 25 to 30% last year.  He has an AICD which has not fired and is seen by Dr. Caryl Comes.  He has an appointment with Dr. Caryl Comes tomorrow.  Blood pressures a goal cholesterol is been at goal.  He reports compliance with CPAP.  Overall he is doing well.  Weight is 3 pounds lighter than his last visit.  PMHx:  Past Medical History:  Diagnosis Date  . ALLERGIC RHINITIS   . Asthma   . Cervical radiculitis   . CHF (congestive heart failure) ([place])   . Chronic systolic heart failure ([place]) 02/28/2015  . GERD (gastroesophageal reflux disease)   . GOUT   . Hyperglycemia 08/28/2014  . HYPERLIPIDEMIA   . HYPERTENSION   . Lateral epicondylitis of right elbow   . Nonischemic cardiomyopathy ([place]) 09/28/2014  . Obesity (BMI 30-39.9) 06/06/2015  . S/P cardiac cath wjith normal coronary arteries  09/28/2014  . Sleep apnea  Past Surgical History:  Procedure Laterality Date  . COLONOSCOPY    . COLONOSCOPY WITH PROPOFOL N/A 06/17/2017   Procedure: COLONOSCOPY WITH PROPOFOL;  Surgeon: Milus Banister, MD;  Location: [facility] ENDOSCOPY;  Service: Endoscopy;  Laterality: N/A;  . EP IMPLANTABLE DEVICE N/A 03/21/2015   Procedure: SubQ ICD Implant;  Surgeon: Deboraha Sprang, MD;  Location: [place] [facility] CV LAB;  Service: Cardiovascular;  Laterality: N/A;  . LEFT AND RIGHT HEART CATHETERIZATION WITH CORONARY ANGIOGRAM N/A 09/27/2014   Procedure: LEFT AND RIGHT HEART CATHETERIZATION WITH CORONARY ANGIOGRAM;  Surgeon: Belva Crome, MD;  Location: [facility] CATH LAB;  Service: Cardiovascular;  Laterality: N/A;  . SUBQ ICD  03/21/2015    FAMHx:  Family History  Problem Relation Age of Onset  . Lung cancer Mother   . Diabetes Father   . Heart disease Father   . Heart attack Father 16  . Hypertension Sister   . Aneurysm Sister 36       brain aneurysm  . Healthy Brother     . Healthy Brother   . Healthy Brother   . Colon cancer Neg Hx   . Esophageal cancer Neg Hx   . Rectal cancer Neg Hx   . Stomach cancer Neg Hx     SOCHx:   reports that he has never smoked. He has never used smokeless tobacco. He reports that he does not drink alcohol or use drugs.  ALLERGIES:  No Known Allergies  ROS: Pertinent items noted in HPI and remainder of comprehensive ROS otherwise negative.  HOME MEDS: Current Outpatient Medications  Medication Sig Dispense Refill  . aspirin 81 MG EC tablet Take 1 tablet (81 mg total) by mouth daily. 90 tablet 3  . carvedilol (COREG) 25 MG tablet Take 1 tablet (25 mg total) by mouth 2 (two) times daily with a meal. 180 tablet 3  . colchicine 0.6 MG tablet Take 1 tablet (0.6 mg total) by mouth daily. 90 tablet 3  . furosemide (LASIX) 40 MG tablet Take 1 tablet (40 mg total) by mouth daily. 90 tablet 3  . pantoprazole (PROTONIX) 40 MG tablet TAKE 1 TABLET BY MOUTH  DAILY 90 tablet 3  . pravastatin (PRAVACHOL) 40 MG tablet Take 1 tablet (40 mg total) by mouth every evening. 90 tablet 3  . sacubitril-valsartan (ENTRESTO) 97-103 MG Take 1 tablet by mouth 2 (two) times daily. 180 tablet 3  . spironolactone (ALDACTONE) 25 MG tablet Take 0.5 tablets (12.5 mg total) by mouth daily. 45 tablet 3   No current facility-administered medications for this visit.     LABS/IMAGING: No results found for this or any previous visit (from the past 48 hour(s)). No results found.  WEIGHTS: Wt Readings from Last 3 Encounters:  03/07/18 197 lb 6.4 oz (89.5 kg)  02/14/18 200 lb 6.4 oz (90.9 kg)  12/10/17 200 lb (90.7 kg)    VITALS: BP 116/82 (BP Location: Left Arm, Patient Position: Sitting, Cuff Size: Normal)   Pulse 63   Ht 5\' 10"  (1.778 m)   Wt 197 lb 6.4 oz (89.5 kg)   BMI 28.32 kg/m   EXAM: General appearance: alert and no distress Neck: no carotid bruit, no JVD and thyroid not enlarged, symmetric, no tenderness/mass/nodules Lungs: clear  to auscultation bilaterally Heart: regular rate and rhythm, S1, S2 normal, no murmur, click, rub or gallop and AICD pocket is intact Abdomen: soft, non-tender; bowel sounds normal; no masses,  no organomegaly Extremities: extremities normal, atraumatic, no cyanosis or edema  Pulses: 2+ and symmetric Skin: Skin color, texture, turgor normal. No rashes or lesions Neurologic: Grossly normal Psych: Pleasant  EKG: Sinus rhythm 63, LVH with QRS widening, inferior T wave changes-personally reviewed  ASSESSMENT: 1. Nonischemic, probably hypertensive cardiomyopathy, EF 15% with normal coronaries-NYHA class I-II symptoms (EF improved to 25-30%-01/2017) 2. Hypertension-controlled 3. Dyslipidemia 4. OSA on CPAP 5. Gout 6. Status post AICD for primary prevention of sudden cardiac death  PLAN: 1.   Matthew Guerrero continues to endorse NYHA class I symptoms.  LVEF had improved a year ago.  He has been on high-dose Entresto, Aldactone and standard of care for heart failure.  Blood pressures controlled and cholesterol is at goal.  He is compliant with CPAP.  He has follow-up with Dr. Caryl Comes for monitoring of his AICD which has not fired.  We will plan a repeat echocardiogram and continue current therapy.  Follow-up in 6 months.  Pixie Casino, MD, [facility], [place] Director of the Advanced Lipid Disorders &  Cardiovascular Risk Reduction Clinic Diplomate of the American Board of Clinical Lipidology Attending Cardiologist  Direct Dial: [phone]  Fax: [phone]  Website:  [email or web address]  Nadean Corwin Taleyah Hillman 03/07/2018, 1:32 PM

## 2018-03-08 ENCOUNTER — Encounter: Payer: Self-pay | Admitting: Internal Medicine

## 2018-03-08 ENCOUNTER — Ambulatory Visit (INDEPENDENT_AMBULATORY_CARE_PROVIDER_SITE_OTHER): Payer: Medicare Other | Admitting: Internal Medicine

## 2018-03-08 VITALS — BP 106/68 | HR 74 | Ht 70.0 in | Wt 199.0 lb

## 2018-03-08 DIAGNOSIS — I428 Other cardiomyopathies: Secondary | ICD-10-CM | POA: Diagnosis not present

## 2018-03-08 DIAGNOSIS — Z95810 Presence of automatic (implantable) cardiac defibrillator: Secondary | ICD-10-CM | POA: Diagnosis not present

## 2018-03-08 LAB — CUP PACEART INCLINIC DEVICE CHECK
Implantable Lead Location: 753862
Implantable Pulse Generator Implant Date: 20160929
MDC IDC LEAD IMPLANT DT: 20160929
MDC IDC SESS DTM: 20190917170055
Pulse Gen Serial Number: 118449

## 2018-03-08 NOTE — Patient Instructions (Addendum)
Medication Instructions:  Your physician recommends that you continue on your current medications as directed. Please refer to the Current Medication list given to you today.  Labwork:  You will have labs drawn today: BMP   Testing/Procedures: None ordered.  Follow-Up: Your physician recommends that you schedule a follow-up appointment in:  6 months with Dr Debara Pickett.  Your physician wants you to follow-up in: One Year with Dillon Bjork, PA. You will receive a reminder letter in the mail two months in advance. If you don't receive a letter, please call our office to schedule the follow-up appointment.   Any Other Special Instructions Will Be Listed Below (If Applicable).     If you need a refill on your cardiac medications before your next appointment, please call your pharmacy.

## 2018-03-08 NOTE — Progress Notes (Signed)
ELECTROPHYSIOLOGY OFFICE NOTE  Patient ID: Matthew Guerrero, MRN: [id number], DOB/AGE: Sep 19, 1955 62 y.o. Admit date: (Not on file) Date of Consult: 03/08/2018  Primary Physician: Biagio Borg, MD Primary Cardiologist: [facility] Chief Complaint:  ICD   HPI Matthew Guerrero is a 62 y.o. male Seen in follow-up for S ICD implanted for primary prevention 9/16   He has a history of nonischemic cardiomyopathy. He presented 4/16 with acute heart failure. Ejection fraction was 15-20%. He underwent catheterization demonstrating normal coronary arteries.     DATE TEST EF   8/16 echo 15 %   8/17 echo 15 %   8/18 Echo 1 25-30%         Date Cr K  10/18 1.08 3.9         He has had TWOS in the secondary vector  now programmed in alternate The patient denies chest pain, shortness of breath, nocturnal dyspnea, orthopnea or peripheral edema.  There have been no palpitations, lightheadedness or syncope.       Past Medical History:  Diagnosis Date  . ALLERGIC RHINITIS   . Asthma   . Cervical radiculitis   . CHF (congestive heart failure) ([place])   . Chronic systolic heart failure ([place]) 02/28/2015  . GERD (gastroesophageal reflux disease)   . GOUT   . Hyperglycemia 08/28/2014  . HYPERLIPIDEMIA   . HYPERTENSION   . Lateral epicondylitis of right elbow   . Nonischemic cardiomyopathy ([place]) 09/28/2014  . Obesity (BMI 30-39.9) 06/06/2015  . S/P cardiac cath wjith normal coronary arteries  09/28/2014  . Sleep apnea       Surgical History:  Past Surgical History:  Procedure Laterality Date  . COLONOSCOPY    . COLONOSCOPY WITH PROPOFOL N/A 06/17/2017   Procedure: COLONOSCOPY WITH PROPOFOL;  Surgeon: Milus Banister, MD;  Location: [facility] ENDOSCOPY;  Service: Endoscopy;  Laterality: N/A;  . EP IMPLANTABLE DEVICE N/A 03/21/2015   Procedure: SubQ ICD Implant;  Surgeon: Deboraha Sprang, MD;  Location: [place] CV LAB;  Service: Cardiovascular;  Laterality: N/A;  . LEFT AND RIGHT HEART CATHETERIZATION  WITH CORONARY ANGIOGRAM N/A 09/27/2014   Procedure: LEFT AND RIGHT HEART CATHETERIZATION WITH CORONARY ANGIOGRAM;  Surgeon: Belva Crome, MD;  Location: [facility] CATH LAB;  Service: Cardiovascular;  Laterality: N/A;  . SUBQ ICD  03/21/2015     Home Meds: Prior to Admission medications   Medication Sig Start Date End Date Taking? Authorizing Provider  aspirin EC 81 MG EC tablet Take 1 tablet (81 mg total) by mouth daily. 09/28/14  Yes Isaiah Serge, NP  carvedilol (COREG) 25 MG tablet Take 1 tablet (25 mg total) by mouth 2 (two) times daily with a meal. 02/28/15  Yes Biagio Borg, MD  colchicine 0.6 MG tablet Take 1 tablet (0.6 mg total) by mouth daily. 02/28/15  Yes Biagio Borg, MD  furosemide (LASIX) 40 MG tablet Take 1 tablet (40 mg total) by mouth daily. 02/28/15  Yes Biagio Borg, MD  sacubitril-valsartan (ENTRESTO) 49-51 MG Take 1 tablet by mouth 2 (two) times daily. 09/30/14  Yes Pixie Casino, MD  spironolactone (ALDACTONE) 25 MG tablet Take 0.5 tablets (12.5 mg total) by mouth daily. 02/28/15  Yes Biagio Borg, MD  traMADol (ULTRAM) 50 MG tablet Take 1 tablet (50 mg total) by mouth every 8 (eight) hours as needed. 06/22/14  Yes Leonard Schwartz, MD      Allergies: No Known Allergies  Social History   Socioeconomic History  .  Marital status: Single    Spouse name: Not on file  . Number of children: Not on file  . Years of education: Not on file  . Highest education level: Not on file  Occupational History  . Not on file  Social Needs  . Financial resource strain: Not on file  . Food insecurity:    Worry: Not on file    Inability: Not on file  . Transportation needs:    Medical: Not on file    Non-medical: Not on file  Tobacco Use  . Smoking status: Never Smoker  . Smokeless tobacco: Never Used  Substance and Sexual Activity  . Alcohol use: No    Alcohol/week: 0.0 standard drinks  . Drug use: No  . Sexual activity: Not on file  Lifestyle  . Physical activity:    Days per week: Not  on file    Minutes per session: Not on file  . Stress: Not on file  Relationships  . Social connections:    Talks on phone: Not on file    Gets together: Not on file    Attends religious service: Not on file    Active member of club or organization: Not on file    Attends meetings of clubs or organizations: Not on file    Relationship status: Not on file  . Intimate partner violence:    Fear of current or ex partner: Not on file    Emotionally abused: Not on file    Physically abused: Not on file    Forced sexual activity: Not on file  Other Topics Concern  . Not on file  Social History Narrative  . Not on file     Family History  Problem Relation Age of Onset  . Lung cancer Mother   . Diabetes Father   . Heart disease Father   . Heart attack Father 61  . Hypertension Sister   . Aneurysm Sister 68       brain aneurysm  . Healthy Brother   . Healthy Brother   . Healthy Brother   . Colon cancer Neg Hx   . Esophageal cancer Neg Hx   . Rectal cancer Neg Hx   . Stomach cancer Neg Hx      ROS:  Please see the history of present illness.     All other systems reviewed and negative.    Physical Exam: Blood pressure 106/68, pulse 74, height 5\' 10"  (1.778 m), weight 199 lb (90.3 kg), SpO2 96 %. Well developed and nourished in no acute distress HENT normal Neck supple with JVP-flat Clear Device pocket well healed; without hematoma or erythema.  There is no tethering Regular rate and rhythm, no murmurs or gallops Abd-soft with active BS No Clubbing cyanosis edema Skin-warm and dry A & Oriented  Grossly normal sensory and motor function       Labs: Cardiac Enzymes No results for input(s): CKTOTAL, CKMB, TROPONINI in the last 72 hours. CBC Lab Results  Component Value Date   WBC 6.3 02/14/2018   HGB 15.3 02/14/2018   HCT 47.2 02/14/2018   MCV 87.7 02/14/2018   PLT 124 (L) 02/14/2018   PROTIME: No results for input(s): LABPROT, INR in the last 72  hours. Chemistry No results for input(s): NA, K, CL, CO2, BUN, CREATININE, CALCIUM, PROT, BILITOT, ALKPHOS, ALT, AST, GLUCOSE in the last 168 hours.  Invalid input(s): LABALBU Lipids Lab Results  Component Value Date   CHOL 151 03/29/2017   HDL 40.80  03/29/2017   LDLCALC 80 03/29/2017   TRIG 152.0 (H) 03/29/2017   BNP No results found for: PROBNP Thyroid Function Tests: No results for input(s): TSH, T4TOTAL, T3FREE, THYROIDAB in the last 72 hours.  Invalid input(s): FREET3    Miscellaneous No results found for: DDIMER  Radiology/Studies:  No results found.    Assessment and Plan:  Nonischemic cardiomyopathy  Congestive heart failure-colonic-class II systolic  ICD SubQ  The patient's device was interrogated.  The information was reviewed. No changes were made in the programming.     Hypertension    High Risk Medication Surveillance  Euvolemic continue current meds  Need to check surveillance labs on High Risk Medication    will see again in one year        [name]

## 2018-03-09 ENCOUNTER — Ambulatory Visit: Payer: Medicare Other | Admitting: Internal Medicine

## 2018-03-09 LAB — BASIC METABOLIC PANEL
BUN/Creatinine Ratio: 9 — ABNORMAL LOW (ref 10–24)
BUN: 11 mg/dL (ref 8–27)
CHLORIDE: 102 mmol/L (ref 96–106)
CO2: 24 mmol/L (ref 20–29)
CREATININE: 1.19 mg/dL (ref 0.76–1.27)
Calcium: 9.6 mg/dL (ref 8.6–10.2)
GFR calc Af Amer: 75 mL/min/{1.73_m2} (ref >59)
GFR calc non Af Amer: 65 mL/min/{1.73_m2} (ref >59)
GLUCOSE: 99 mg/dL (ref 65–99)
POTASSIUM: 4.2 mmol/L (ref 3.5–5.2)
SODIUM: 143 mmol/L (ref 134–144)

## 2018-03-14 ENCOUNTER — Ambulatory Visit (HOSPITAL_COMMUNITY): Payer: Medicare Other | Attending: Cardiovascular Disease

## 2018-03-14 ENCOUNTER — Other Ambulatory Visit: Payer: Self-pay

## 2018-03-14 DIAGNOSIS — E785 Hyperlipidemia, unspecified: Secondary | ICD-10-CM | POA: Diagnosis not present

## 2018-03-14 DIAGNOSIS — I10 Essential (primary) hypertension: Secondary | ICD-10-CM | POA: Diagnosis not present

## 2018-03-14 DIAGNOSIS — I428 Other cardiomyopathies: Secondary | ICD-10-CM | POA: Insufficient documentation

## 2018-03-21 ENCOUNTER — Other Ambulatory Visit (INDEPENDENT_AMBULATORY_CARE_PROVIDER_SITE_OTHER): Payer: Medicare Other

## 2018-03-21 DIAGNOSIS — R7303 Prediabetes: Secondary | ICD-10-CM

## 2018-03-21 DIAGNOSIS — Z0000 Encounter for general adult medical examination without abnormal findings: Secondary | ICD-10-CM

## 2018-03-21 DIAGNOSIS — E785 Hyperlipidemia, unspecified: Secondary | ICD-10-CM | POA: Diagnosis not present

## 2018-03-21 LAB — URINALYSIS, ROUTINE W REFLEX MICROSCOPIC
BILIRUBIN URINE: NEGATIVE
HGB URINE DIPSTICK: NEGATIVE
KETONES UR: NEGATIVE
LEUKOCYTES UA: NEGATIVE
NITRITE: NEGATIVE
RBC / HPF: NONE SEEN (ref 0–?)
Total Protein, Urine: NEGATIVE
URINE GLUCOSE: NEGATIVE
UROBILINOGEN UA: 0.2 (ref 0.0–1.0)
WBC UA: NONE SEEN (ref 0–?)
pH: 7 (ref 5.0–8.0)

## 2018-03-21 LAB — CBC WITH DIFFERENTIAL/PLATELET
Basophils Absolute: 0 10*3/uL (ref 0.0–0.1)
Basophils Relative: 0.5 % (ref 0.0–3.0)
EOS ABS: 0.1 10*3/uL (ref 0.0–0.7)
EOS PCT: 2.1 % (ref 0.0–5.0)
HCT: 46.7 % (ref 39.0–52.0)
HEMOGLOBIN: 15.4 g/dL (ref 13.0–17.0)
LYMPHS ABS: 2.3 10*3/uL (ref 0.7–4.0)
Lymphocytes Relative: 39.5 % (ref 12.0–46.0)
MCHC: 33 g/dL (ref 30.0–36.0)
MCV: 87.2 fl (ref 78.0–100.0)
MONO ABS: 0.3 10*3/uL (ref 0.1–1.0)
Monocytes Relative: 5.8 % (ref 3.0–12.0)
NEUTROS PCT: 52.1 % (ref 43.0–77.0)
Neutro Abs: 3 10*3/uL (ref 1.4–7.7)
Platelets: 116 10*3/uL — ABNORMAL LOW (ref 150.0–400.0)
RBC: 5.36 Mil/uL (ref 4.22–5.81)
RDW: 13.9 % (ref 11.5–15.5)
WBC: 5.8 10*3/uL (ref 4.0–10.5)

## 2018-03-21 LAB — LIPID PANEL
CHOL/HDL RATIO: 4
CHOLESTEROL: 140 mg/dL (ref 0–200)
HDL: 38 mg/dL — ABNORMAL LOW (ref >39.00)
LDL CALC: 67 mg/dL (ref 0–99)
NonHDL: 101.8
Triglycerides: 174 mg/dL — ABNORMAL HIGH (ref 0.0–149.0)
VLDL: 34.8 mg/dL (ref 0.0–40.0)

## 2018-03-21 LAB — HEPATIC FUNCTION PANEL
ALT: 17 U/L (ref 0–53)
AST: 15 U/L (ref 0–37)
Albumin: 4.4 g/dL (ref 3.5–5.2)
Alkaline Phosphatase: 46 U/L (ref 39–117)
BILIRUBIN DIRECT: 0.1 mg/dL (ref 0.0–0.3)
BILIRUBIN TOTAL: 0.5 mg/dL (ref 0.2–1.2)
Total Protein: 6.8 g/dL (ref 6.0–8.3)

## 2018-03-21 LAB — TSH: TSH: 1.65 u[IU]/mL (ref 0.35–4.50)

## 2018-03-21 LAB — PSA: PSA: 0.51 ng/mL (ref 0.10–4.00)

## 2018-03-21 LAB — HEMOGLOBIN A1C: Hgb A1c MFr Bld: 6.3 % (ref 4.6–6.5)

## 2018-03-23 ENCOUNTER — Ambulatory Visit (INDEPENDENT_AMBULATORY_CARE_PROVIDER_SITE_OTHER): Payer: Medicare Other | Admitting: *Deleted

## 2018-03-23 DIAGNOSIS — I428 Other cardiomyopathies: Secondary | ICD-10-CM

## 2018-03-23 NOTE — Progress Notes (Signed)
Remote ICD transmission.   

## 2018-03-29 ENCOUNTER — Ambulatory Visit (INDEPENDENT_AMBULATORY_CARE_PROVIDER_SITE_OTHER): Payer: Medicare Other | Admitting: Internal Medicine

## 2018-03-29 ENCOUNTER — Encounter: Payer: Self-pay | Admitting: Internal Medicine

## 2018-03-29 VITALS — BP 122/84 | HR 71 | Temp 98.0°F | Ht 70.0 in | Wt 202.0 lb

## 2018-03-29 DIAGNOSIS — Z0000 Encounter for general adult medical examination without abnormal findings: Secondary | ICD-10-CM

## 2018-03-29 DIAGNOSIS — R7303 Prediabetes: Secondary | ICD-10-CM | POA: Diagnosis not present

## 2018-03-29 DIAGNOSIS — Z23 Encounter for immunization: Secondary | ICD-10-CM

## 2018-03-29 NOTE — Assessment & Plan Note (Signed)
stable overall by history and exam, recent data reviewed with pt, and pt to continue medical treatment as before,  to f/u any worsening symptoms or concerns  

## 2018-03-29 NOTE — Assessment & Plan Note (Signed)

## 2018-03-29 NOTE — Progress Notes (Signed)
Subjective:    Patient ID: Matthew Guerrero, male    DOB: 23-Jan-1956, 62 y.o.   MRN: [id number]  HPI  Here for wellness and f/u;  Overall doing ok;  Pt denies Chest pain, worsening SOB, DOE, wheezing, orthopnea, PND, worsening LE edema, palpitations, dizziness or syncope.  Pt denies neurological change such as new headache, facial or extremity weakness.  Pt denies polydipsia, polyuria, or low sugar symptoms. Pt states overall good compliance with treatment and medications, good tolerability, and has been trying to follow appropriate diet.  Pt denies worsening depressive symptoms, suicidal ideation or panic. No fever, night sweats, wt loss, loss of appetite, or other constitutional symptoms.  Pt states good ability with ADL's, has low fall risk, home safety reviewed and adequate, no other significant changes in hearing or vision, and only occasionally active with exercise.  No new complaints Past Medical History:  Diagnosis Date  . ALLERGIC RHINITIS   . Asthma   . Cervical radiculitis   . CHF (congestive heart failure) ([place])   . Chronic systolic heart failure ([place]) 02/28/2015  . GERD (gastroesophageal reflux disease)   . GOUT   . Hyperglycemia 08/28/2014  . HYPERLIPIDEMIA   . HYPERTENSION   . Lateral epicondylitis of right elbow   . Nonischemic cardiomyopathy ([place]) 09/28/2014  . Obesity (BMI 30-39.9) 06/06/2015  . S/P cardiac cath wjith normal coronary arteries  09/28/2014  . Sleep apnea    Past Surgical History:  Procedure Laterality Date  . COLONOSCOPY    . COLONOSCOPY WITH PROPOFOL N/A 06/17/2017   Procedure: COLONOSCOPY WITH PROPOFOL;  Surgeon: Milus Banister, MD;  Location: [facility];  Service: Endoscopy;  Laterality: N/A;  . EP IMPLANTABLE DEVICE N/A 03/21/2015   Procedure: SubQ ICD Implant;  Surgeon: Deboraha Sprang, MD;  Location: [facility];  Service: Cardiovascular;  Laterality: N/A;  . LEFT AND RIGHT HEART CATHETERIZATION WITH CORONARY ANGIOGRAM N/A 09/27/2014   Procedure:  LEFT AND RIGHT HEART CATHETERIZATION WITH CORONARY ANGIOGRAM;  Surgeon: Belva Crome, MD;  Location: [facility] CATH LAB;  Service: Cardiovascular;  Laterality: N/A;  . SUBQ ICD  03/21/2015    reports that he has never smoked. He has never used smokeless tobacco. He reports that he does not drink alcohol or use drugs. family history includes Aneurysm (age of onset: 50) in his sister; Diabetes in his father; Healthy in his brother, brother, and brother; Heart attack (age of onset: 68) in his father; Heart disease in his father; Hypertension in his sister; Lung cancer in his mother. No Known Allergies Current Outpatient Medications on File Prior to Visit  Medication Sig Dispense Refill  . aspirin 81 MG EC tablet Take 1 tablet (81 mg total) by mouth daily. 90 tablet 3  . carvedilol (COREG) 25 MG tablet Take 1 tablet (25 mg total) by mouth 2 (two) times daily with a meal. 180 tablet 3  . colchicine 0.6 MG tablet Take 1 tablet (0.6 mg total) by mouth daily. 90 tablet 3  . furosemide (LASIX) 40 MG tablet Take 1 tablet (40 mg total) by mouth daily. 90 tablet 3  . pantoprazole (PROTONIX) 40 MG tablet TAKE 1 TABLET BY MOUTH  DAILY 90 tablet 3  . pravastatin (PRAVACHOL) 40 MG tablet Take 1 tablet (40 mg total) by mouth every evening. 90 tablet 3  . sacubitril-valsartan (ENTRESTO) 97-103 MG Take 1 tablet by mouth 2 (two) times daily. 180 tablet 3  . spironolactone (ALDACTONE) 25 MG tablet Take 0.5 tablets (12.5 mg  total) by mouth daily. 45 tablet 3   No current facility-administered medications on file prior to visit.    Review of Systems Constitutional: Negative for other unusual diaphoresis, sweats, appetite or weight changes HENT: Negative for other worsening hearing loss, ear pain, facial swelling, mouth sores or neck stiffness.   Eyes: Negative for other worsening pain, redness or other visual disturbance.  Respiratory: Negative for other stridor or swelling Cardiovascular: Negative for other palpitations  or other chest pain  Gastrointestinal: Negative for worsening diarrhea or loose stools, blood in stool, distention or other pain Genitourinary: Negative for hematuria, flank pain or other change in urine volume.  Musculoskeletal: Negative for myalgias or other joint swelling.  Skin: Negative for other color change, or other wound or worsening drainage.  Neurological: Negative for other syncope or numbness. Hematological: Negative for other adenopathy or swelling Psychiatric/Behavioral: Negative for hallucinations, other worsening agitation, SI, self-injury, or new decreased concentration All other system neg per pt    Objective:   Physical Exam BP 122/84   Pulse 71   Temp 98 F (36.7 C) (Oral)   Ht 5\' 10"  (1.778 m)   Wt 202 lb (91.6 kg)   SpO2 95%   BMI 28.98 kg/m  VS noted,  Constitutional: Pt appears in NAD HENT: Head: NCAT.  Right Ear: External ear normal.  Left Ear: External ear normal.  Eyes: . Pupils are equal, round, and reactive to light. Conjunctivae and EOM are normal Nose: without d/c or deformity Neck: Neck supple. Gross normal ROM Cardiovascular: Normal rate and regular rhythm.   Pulmonary/Chest: Effort normal and breath sounds without rales or wheezing.  Abd:  Soft, NT, ND, + BS, no organomegaly Neurological: Pt is alert. At baseline orientation, motor grossly intact Skin: Skin is warm. No rashes, other new lesions, no LE edema Psychiatric: Pt behavior is normal without agitation  No other exam findings Lab Results  Component Value Date   WBC 5.8 03/21/2018   HGB 15.4 03/21/2018   HCT 46.7 03/21/2018   PLT 116.0 (L) 03/21/2018   GLUCOSE 99 03/08/2018   CHOL 140 03/21/2018   TRIG 174.0 (H) 03/21/2018   HDL 38.00 (L) 03/21/2018   LDLDIRECT 105.8 06/18/2010   LDLCALC 67 03/21/2018   ALT 17 03/21/2018   AST 15 03/21/2018   NA 143 03/08/2018   K 4.2 03/08/2018   CL 102 03/08/2018   CREATININE 1.19 03/08/2018   BUN 11 03/08/2018   CO2 24 03/08/2018    TSH 1.65 03/21/2018   PSA 0.51 03/21/2018   INR 1.2 (H) 03/15/2015   HGBA1C 6.3 03/21/2018       Assessment & Plan:

## 2018-03-29 NOTE — Patient Instructions (Addendum)

## 2018-03-30 MED ORDER — TRIAMCINOLONE ACETONIDE 55 MCG/ACT NA AERO: 2.0000 | INHALATION_SPRAY | Freq: Every day | NASAL | 12 refills | Status: DC

## 2018-03-30 MED ORDER — CETIRIZINE HCL 10 MG PO TABS: 10.0000 mg | ORAL_TABLET | Freq: Every day | ORAL | 11 refills | Status: DC

## 2018-04-04 DIAGNOSIS — G4733 Obstructive sleep apnea (adult) (pediatric): Secondary | ICD-10-CM | POA: Diagnosis not present

## 2018-04-15 LAB — CUP PACEART REMOTE DEVICE CHECK
Date Time Interrogation Session: 20191025122205
Implantable Lead Implant Date: 20160929
Implantable Lead Model: 3401
MDC IDC LEAD LOCATION: 753862
MDC IDC PG IMPLANT DT: 20160929
Pulse Gen Serial Number: 118449
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
## 2018-05-31 ENCOUNTER — Other Ambulatory Visit: Payer: Self-pay | Admitting: Internal Medicine

## 2018-06-23 ENCOUNTER — Ambulatory Visit (INDEPENDENT_AMBULATORY_CARE_PROVIDER_SITE_OTHER): Payer: Medicare Other

## 2018-06-23 DIAGNOSIS — I428 Other cardiomyopathies: Secondary | ICD-10-CM | POA: Diagnosis not present

## 2018-06-23 LAB — CUP PACEART REMOTE DEVICE CHECK
Date Time Interrogation Session: 20200102184742
Implantable Lead Implant Date: 20160929
Implantable Lead Location: 753862
Implantable Lead Model: 3401
Implantable Pulse Generator Implant Date: 20160929
Pulse Gen Serial Number: 118449

## 2018-06-23 NOTE — Progress Notes (Signed)
Remote ICD transmission.   

## 2018-06-27 ENCOUNTER — Encounter: Payer: Self-pay | Admitting: Internal Medicine

## 2018-06-28 ENCOUNTER — Telehealth: Payer: Self-pay | Admitting: Internal Medicine

## 2018-06-28 NOTE — Telephone Encounter (Signed)
New message    Pt stated that he dropped paperwork off  For Dr. Debara Pickett to fill out and wanted to know if paperwork was faxed to  number on paper

## 2018-06-28 NOTE — Telephone Encounter (Signed)
Spoke with pt. Adv pt that the paperwork has been received by Dr.Hilty's nurse Eliezer Lofts. Adv pt that Dr.Hilty is out of the office until next week. He will be contacted when completed. Pt rqst that the completed from be faxed to the # listed on form attn: Priscilla.

## 2018-07-04 ENCOUNTER — Telehealth: Payer: Self-pay | Admitting: Internal Medicine

## 2018-07-04 NOTE — Telephone Encounter (Signed)
Returned call to patient. He states he was unable to open up the disc so he called his lawyer. The lawyer told him that the disability judge deemed him disabled. Asked that he fax any documentation he has to our office.

## 2018-07-04 NOTE — Telephone Encounter (Signed)
Patient reports he has a reward letter confirming disability (as of September 25, 2014 with beginning payments confirmed October 2016) and a disc from his lawyer who helped him obtain disability.  Upon chart review, there is correspondence received from a disability doctor who performed his exam (scanned 03/13/2015) but he states this examination did not qualify him for SSD.   Advised patient to see if he can open disc from lawyer to see if the details of the authorizing provider are available.

## 2018-07-04 NOTE — Telephone Encounter (Signed)
New Message   Pt states he is returning call for [place]

## 2018-07-04 NOTE — Telephone Encounter (Signed)
MD spoke with patient about paperwork received last week that he is requesting me signed stating he is disabled and thus should be excluded from paying property taxed. Per chart review, there was no documentation stating Dr. Debara Pickett deemed patient permanently disabled. Dr. Debara Pickett notified patient that he can sign the form IF patient can provide documentation that he is permanently disabled.

## 2018-07-04 NOTE — Telephone Encounter (Signed)
Returned call to patient. Conversation documented in another telephone encounter

## 2018-07-07 DIAGNOSIS — G4733 Obstructive sleep apnea (adult) (pediatric): Secondary | ICD-10-CM | POA: Diagnosis not present

## 2018-07-08 NOTE — Telephone Encounter (Signed)
Patient notified that his disability paperwork for property tax exclusion was signed off on by MD after receiving documentation that he HAS SSD approved as of April 2016 (copy of letter to be scanned for charting purposes)  Faxed signed paperwork + letter to [place] [employer] [place] [employer] Attn: [place] @ [phone]  Phone: [phone]

## 2018-07-14 ENCOUNTER — Emergency Department (HOSPITAL_COMMUNITY)
Admission: EM | Admit: 2018-07-14 | Discharge: 2018-07-14 | Disposition: A | Discharge status: Home / Self Care | Payer: Medicare Other | Attending: Emergency Medicine | Admitting: Emergency Medicine

## 2018-07-14 ENCOUNTER — Encounter (HOSPITAL_COMMUNITY): Payer: Self-pay

## 2018-07-14 DIAGNOSIS — Y9389 Activity, other specified: Secondary | ICD-10-CM | POA: Diagnosis not present

## 2018-07-14 DIAGNOSIS — M545 Low back pain, unspecified: Secondary | ICD-10-CM

## 2018-07-14 DIAGNOSIS — J45909 Unspecified asthma, uncomplicated: Secondary | ICD-10-CM | POA: Diagnosis not present

## 2018-07-14 DIAGNOSIS — D122 Benign neoplasm of ascending colon: Secondary | ICD-10-CM | POA: Insufficient documentation

## 2018-07-14 DIAGNOSIS — Y999 Unspecified external cause status: Secondary | ICD-10-CM | POA: Diagnosis not present

## 2018-07-14 DIAGNOSIS — D124 Benign neoplasm of descending colon: Secondary | ICD-10-CM | POA: Diagnosis not present

## 2018-07-14 DIAGNOSIS — Z7982 Long term (current) use of aspirin: Secondary | ICD-10-CM | POA: Diagnosis not present

## 2018-07-14 DIAGNOSIS — Z95810 Presence of automatic (implantable) cardiac defibrillator: Secondary | ICD-10-CM | POA: Diagnosis not present

## 2018-07-14 DIAGNOSIS — Y92410 Unspecified street and highway as the place of occurrence of the external cause: Secondary | ICD-10-CM | POA: Diagnosis not present

## 2018-07-14 DIAGNOSIS — I110 Hypertensive heart disease with heart failure: Secondary | ICD-10-CM | POA: Diagnosis not present

## 2018-07-14 DIAGNOSIS — Z79899 Other long term (current) drug therapy: Secondary | ICD-10-CM | POA: Insufficient documentation

## 2018-07-14 DIAGNOSIS — I5022 Chronic systolic (congestive) heart failure: Secondary | ICD-10-CM | POA: Diagnosis not present

## 2018-07-14 MED ORDER — METHOCARBAMOL 750 MG PO TABS: 750.0000 mg | ORAL_TABLET | Freq: Three times a day (TID) | ORAL | 0 refills | Status: DC | PRN

## 2018-07-14 NOTE — Discharge Instructions (Addendum)
I have given you information on the maximum safe dosing of ibuprofen and Tylenol below.  As we discussed ibuprofen does have some slight risks, especially given your medical history.  Therefore please start with Tylenol, and if that does not control your pain consider adding in ibuprofen.  Your blood pressure is slightly elevated today, 148/94.  Please follow-up with your primary care doctor in the next week to get this rechecked.    You have any new, concerning, or worsening symptoms please seek additional medical care and evaluation.  You are being prescribed a medication which may make you sleepy. For 24 hours after one dose please do not drive, operate heavy machinery, care for a small child with out another adult present, or perform any activities that may cause harm to you or someone else if you were to fall asleep or be impaired.   Please take Ibuprofen (Advil, motrin) and Tylenol (acetaminophen) to relieve your pain.  You may take up to 600 MG (3 pills) of normal strength ibuprofen every 8 hours as needed.  In between doses of ibuprofen you make take tylenol, up to 1,000 mg (two extra strength pills).  Do not take more than 3,000 mg tylenol in a 24 hour period.  Please check all medication labels as many medications such as pain and cold medications may contain tylenol.  Do not drink alcohol while taking these medications.  Do not take other NSAID'S while taking ibuprofen (such as aleve or naproxen).  Please take ibuprofen with food to decrease stomach upset.  The best way to get rid of muscle pain is by using heat, massage therapy, and gentle stretching/range of motion exercises.
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
## 2018-07-14 NOTE — ED Notes (Signed)
Bed: WTR6 Expected date:  Expected time:  Means of arrival:  Comments: 

## 2018-07-14 NOTE — ED Notes (Signed)
Discharge instructions reviewed with patient. Patient verbalizes understanding. VSS.   
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
## 2018-07-31 ENCOUNTER — Other Ambulatory Visit: Payer: Self-pay | Admitting: Internal Medicine

## 2018-07-31 DIAGNOSIS — I428 Other cardiomyopathies: Secondary | ICD-10-CM

## 2018-08-18 ENCOUNTER — Ambulatory Visit (INDEPENDENT_AMBULATORY_CARE_PROVIDER_SITE_OTHER): Payer: Medicare Other | Admitting: *Deleted

## 2018-08-18 ENCOUNTER — Telehealth: Payer: Self-pay | Admitting: *Deleted

## 2018-08-18 VITALS — BP 146/84 | HR 79 | Resp 17 | Ht 70.0 in | Wt 200.0 lb

## 2018-08-18 DIAGNOSIS — Z0000 Encounter for general adult medical examination without abnormal findings: Secondary | ICD-10-CM | POA: Diagnosis not present

## 2018-08-18 MED ORDER — SILDENAFIL CITRATE 100 MG PO TABS: 50.0000 mg | ORAL_TABLET | Freq: Every day | ORAL | 11 refills | Status: DC | PRN

## 2018-08-18 NOTE — Progress Notes (Addendum)
Subjective:   Matthew Guerrero is a 63 y.o. male who presents for an Initial Medicare Annual Wellness Visit.  Review of Systems  No ROS.  Medicare Wellness Visit. Additional risk factors are reflected in the social history.  Cardiac Risk Factors include: advanced age (>55men, >69 women);dyslipidemia;male gender;hypertension Sleep patterns: feels rested on waking, gets up 1 times nightly to void and sleeps 6-7 hours nightly. Wears C-PAP  Home Safety/Smoke Alarms: Feels safe in home. Smoke alarms in place.  Living environment; residence and Firearm Safety: 1-story house/ trailer. Lives alone, no needs for DME, good support system Seat Belt Safety/Bike Helmet: Wears seat belt.   PSA-  Lab Results  Component Value Date   PSA 0.51 03/21/2018   PSA 0.63 03/29/2017   PSA 0.39 08/29/2015      Objective:    Today's Vitals   08/18/18 1415  BP: (!) 146/84  Pulse: 79  Resp: 17  SpO2: 99%  Weight: 200 lb (90.7 kg)  Height: 5\' 10"  (1.778 m)   Body mass index is 28.7 kg/m.  Advanced Directives 08/18/2018 07/14/2018 06/17/2017 12/14/2016 12/10/2016 11/10/2016 10/21/2016  Does Patient Have a Medical Advance Directive? No No Yes Yes No No No  Type of Advance Directive - Scientist, water quality of [place];Living will - - -  Does patient want to make changes to medical advance directive? - - - - - - -  Copy of [place] in Chart? - - No - copy requested Yes - - -  Would patient like information on creating a medical advance directive? Yes (ED - Information included in AVS) No - Patient declined - No - Patient declined - No - Patient declined Yes (MAU/Ambulatory/Procedural Areas - Information given)    Current Medications (verified) Outpatient Encounter Medications as of 08/18/2018  Medication Sig  . aspirin 81 MG EC tablet Take 1 tablet (81 mg total) by mouth daily.  . carvedilol (COREG) 25 MG tablet Take 1 tablet (25 mg total) by mouth 2 (two)  times daily with a meal.  . cetirizine (ZYRTEC) 10 MG tablet Take 1 tablet (10 mg total) by mouth daily.  . colchicine 0.6 MG tablet Take 1 tablet (0.6 mg total) by mouth daily.  [name] ENTRESTO 97-103 MG TAKE 1 TABLET BY MOUTH TWO  TIMES DAILY  . furosemide (LASIX) 40 MG tablet TAKE 1 TABLET BY MOUTH  DAILY  . methocarbamol (ROBAXIN) 750 MG tablet Take 1-2 tablets (750-1,500 mg total) by mouth 3 (three) times daily as needed for muscle spasms.  . pantoprazole (PROTONIX) 40 MG tablet TAKE 1 TABLET BY MOUTH  DAILY  . pravastatin (PRAVACHOL) 40 MG tablet Take 1 tablet (40 mg total) by mouth every evening.  [name] spironolactone (ALDACTONE) 25 MG tablet TAKE ONE-HALF TABLET BY  MOUTH DAILY  . triamcinolone (NASACORT) 55 MCG/ACT AERO nasal inhaler Place 2 sprays into the nose daily.   No facility-administered encounter medications on file as of 08/18/2018.     Allergies (verified) Patient has no known allergies.   History: Past Medical History:  Diagnosis Date  . ALLERGIC RHINITIS   . Asthma   . Cervical radiculitis   . CHF (congestive heart failure) ([place])   . Chronic systolic heart failure ([place]) 02/28/2015  . GERD (gastroesophageal reflux disease)   . GOUT   . Hyperglycemia 08/28/2014  . HYPERLIPIDEMIA   . HYPERTENSION   . Lateral epicondylitis of right elbow   . Nonischemic cardiomyopathy ([place]) 09/28/2014  . Obesity (  BMI 30-39.9) 06/06/2015  . S/P cardiac cath wjith normal coronary arteries  09/28/2014  . Sleep apnea    Past Surgical History:  Procedure Laterality Date  . COLONOSCOPY    . COLONOSCOPY WITH PROPOFOL N/A 06/17/2017   Procedure: COLONOSCOPY WITH PROPOFOL;  Surgeon: Milus Banister, MD;  Location: [facility];  Service: Endoscopy;  Laterality: N/A;  . EP IMPLANTABLE DEVICE N/A 03/21/2015   Procedure: SubQ ICD Implant;  Surgeon: Deboraha Sprang, MD;  Location: [facility];  Service: Cardiovascular;  Laterality: N/A;  . LEFT AND RIGHT HEART CATHETERIZATION WITH CORONARY  ANGIOGRAM N/A 09/27/2014   Procedure: LEFT AND RIGHT HEART CATHETERIZATION WITH CORONARY ANGIOGRAM;  Surgeon: Belva Crome, MD;  Location: [facility] CATH LAB;  Service: Cardiovascular;  Laterality: N/A;  . SUBQ ICD  03/21/2015   Family History  Problem Relation Age of Onset  . Lung cancer Mother   . Diabetes Father   . Heart disease Father   . Heart attack Father 70  . Hypertension Sister   . Aneurysm Sister 7       brain aneurysm  . Healthy Brother   . Healthy Brother   . Healthy Brother   . Colon cancer Neg Hx   . Esophageal cancer Neg Hx   . Rectal cancer Neg Hx   . Stomach cancer Neg Hx    Social History   Socioeconomic History  . Marital status: Single    Spouse name: Not on file  . Number of children: Not on file  . Years of education: Not on file  . Highest education level: Not on file  Occupational History  . Occupation: retired  Scientific laboratory technician  . Financial resource strain: Not hard at all  . Food insecurity:    Worry: Never true    Inability: Never true  . Transportation needs:    Medical: No    Non-medical: No  Tobacco Use  . Smoking status: Never Smoker  . Smokeless tobacco: Never Used  Substance and Sexual Activity  . Alcohol use: No    Alcohol/week: 0.0 standard drinks  . Drug use: No  . Sexual activity: Not Currently  Lifestyle  . Physical activity:    Days per week: 0 days    Minutes per session: 0 min  . Stress: Not at all  Relationships  . Social connections:    Talks on phone: More than three times a week    Gets together: More than three times a week    Attends religious service: More than 4 times per year    Active member of club or organization: Not on file    Attends meetings of clubs or organizations: Never    Relationship status: Not on file  Other Topics Concern  . Not on file  Social History Narrative  . Not on file   Tobacco Counseling Counseling given: Not Answered  Activities of Daily Living In your present state of health, do  you have any difficulty performing the following activities: 08/18/2018  Hearing? N  Vision? N  Difficulty concentrating or making decisions? N  Walking or climbing stairs? N  Dressing or bathing? N  Doing errands, shopping? N  Preparing Food and eating ? N  Using the Toilet? N  In the past six months, have you accidently leaked urine? N  Do you have problems with loss of bowel control? N  Managing your Medications? N  Managing your Finances? N  Housekeeping or managing your Housekeeping? N  Some recent data might be hidden     Immunizations and Health Maintenance Immunization History  Administered Date(s) Administered  . Influenza Split 04/28/2012  . Influenza Whole 03/22/2009, 04/18/2010  . Influenza,inj,Quad PF,6+ Mos 02/25/2014, 02/28/2015, 03/29/2018  . Pneumococcal Polysaccharide-23 02/28/2015  . Td 07/01/2009  . Zoster Recombinat (Shingrix) 03/22/2016, 05/22/2016   There are no preventive care reminders to display for this patient.  Patient Care Team: Biagio Borg, MD as PCP - General (Internal Medicine)  Indicate any recent Medical Services you may have received from other than [facility] providers in the past year (date may be approximate).    Assessment:   This is a routine wellness examination for [place]. Physical assessment deferred to PCP.  Hearing/Vision screen Hearing Screening Comments: Able to hear conversational tones w/o difficulty. No issues reported.  Passed whisper test Vision Screening Comments: appointment yearly Dr. Hassell Done  Dietary issues and exercise activities discussed: Current Exercise Habits: Home exercise routine, Type of exercise: walking, Time (Minutes): 40, Frequency (Times/Week): 5, Weekly Exercise (Minutes/Week): 200, Intensity: Mild, Exercise limited by: None identified  Diet (meal preparation, eat out, water intake, caffeinated beverages, dairy products, fruits and vegetables): in general, a "healthy" diet  , well balanced   Reviewed  heart healthy diet. Encouraged patient to increase daily water and healthy fluid intake.  Goals    . Patient Stated     Maintain current health status.      Depression Screen PHQ 2/9 Scores 08/18/2018 03/29/2018 12/14/2016 10/21/2016  PHQ - 2 Score 0 0 0 0    Fall Risk Fall Risk  08/18/2018 03/29/2018 12/14/2016 10/21/2016 07/17/2016  Falls in the past year? 0 No No No No   Cognitive Function:       Ad8 score reviewed for issues:  Issues making decisions: no  Less interest in hobbies / activities: no  Repeats questions, stories (family complaining): no  Trouble using ordinary gadgets (microwave, computer, phone):no  Forgets the month or year: no  Mismanaging finances: no  Remembering appts: no  Daily problems with thinking and/or memory: no Ad8 score is= 0  Screening Tests Health Maintenance  Topic Date Due  . TETANUS/TDAP  07/02/2019  . COLONOSCOPY  06/17/2022  . INFLUENZA VACCINE  Completed  . Hepatitis C Screening  Completed  . HIV Screening  Completed      Plan:     Reviewed health maintenance screenings with patient today and relevant education, vaccines, and/or referrals were provided.   Continue doing brain stimulating activities (puzzles, reading, adult coloring books, staying active) to keep memory sharp.   Continue to eat heart healthy diet (full of fruits, vegetables, whole grains, lean protein, water--limit salt, fat, and sugar intake) and increase physical activity as tolerated.  I have personally reviewed and noted the following in the patient's chart:   . Medical and social history . Use of alcohol, tobacco or illicit drugs  . Current medications and supplements . Functional ability and status . Nutritional status . Physical activity . Advanced directives . List of other physicians . Vitals . Screenings to include cognitive, depression, and falls . Referrals and appointments  In addition, I have reviewed and discussed with patient certain  preventive protocols, quality metrics, and best practice recommendations. A written personalized care plan for preventive services as well as general preventive health recommendations were provided to patient.     Michiel Cowboy, RN   08/18/2018    Medical screening examination/treatment/procedure(s) were performed by non-physician practitioner and as supervising physician  I was immediately available for consultation/collaboration. I agree with above. Cathlean Cower, MD

## 2018-08-18 NOTE — Telephone Encounter (Signed)
Ok this is done 

## 2018-08-18 NOTE — Telephone Encounter (Signed)
During AWV, patient stated that in the past he had a prescription for viagra and asked if PCP would prescribe this medication for patient once again.

## 2018-08-18 NOTE — Patient Instructions (Addendum)
Continue doing brain stimulating activities (puzzles, reading, adult coloring books, staying active) to keep memory sharp.   Continue to eat heart healthy diet (full of fruits, vegetables, whole grains, lean protein, water--limit salt, fat, and sugar intake) and increase physical activity as tolerated.   Matthew Guerrero , Thank you for taking time to come for your Medicare Wellness Visit. I appreciate your ongoing commitment to your health goals. Please review the following plan we discussed and let me know if I can assist you in the future.   These are the goals we discussed: Goals    . Patient Stated     Maintain current health status.       This is a list of the screening recommended for you and due dates:  Health Maintenance  Topic Date Due  . Tetanus Vaccine  07/02/2019  . Colon Cancer Screening  06/17/2022  . Flu Shot  Completed  .  Hepatitis C: One time screening is recommended by Center for Disease Control  (CDC) for  adults born from [birth year] through 1966.   Completed  . HIV Screening  Completed     Health Maintenance, Male A healthy lifestyle and preventive care is important for your health and wellness. Ask your health care provider about what schedule of regular examinations is right for you. What should I know about weight and diet? Eat a Healthy Diet  Eat plenty of vegetables, fruits, whole grains, low-fat dairy products, and lean protein.  Do not eat a lot of foods high in solid fats, added sugars, or salt.  Maintain a Healthy Weight Regular exercise can help you achieve or maintain a healthy weight. You should:  Do at least 150 minutes of exercise each week. The exercise should increase your heart rate and make you sweat (moderate-intensity exercise).  Do strength-training exercises at least twice a week. Watch Your Levels of Cholesterol and Blood Lipids  Have your blood tested for lipids and cholesterol every 5 years starting at 64 years of age. If you are at high  risk for heart disease, you should start having your blood tested when you are 64 years old. You may need to have your cholesterol levels checked more often if: ? Your lipid or cholesterol levels are high. ? You are older than 64 years of age. ? You are at high risk for heart disease. What should I know about cancer screening? Many types of cancers can be detected early and may often be prevented. Lung Cancer  You should be screened every year for lung cancer if: ? You are a current smoker who has smoked for at least 30 years. ? You are a former smoker who has quit within the past 15 years.  Talk to your health care provider about your screening options, when you should start screening, and how often you should be screened. Colorectal Cancer  Routine colorectal cancer screening usually begins at 64 years of age and should be repeated every 5-10 years until you are 64 years old. You may need to be screened more often if early forms of precancerous polyps or small growths are found. Your health care provider may recommend screening at an earlier age if you have risk factors for colon cancer.  Your health care provider may recommend using home test kits to check for hidden blood in the stool.  A small camera at the end of a tube can be used to examine your colon (sigmoidoscopy or colonoscopy). This checks for the earliest forms of  colorectal cancer. Prostate and Testicular Cancer  Depending on your age and overall health, your health care provider may do certain tests to screen for prostate and testicular cancer.  Talk to your health care provider about any symptoms or concerns you have about testicular or prostate cancer. Skin Cancer  Check your skin from head to toe regularly.  Tell your health care provider about any new moles or changes in moles, especially if: ? There is a change in a mole's size, shape, or color. ? You have a mole that is larger than a pencil eraser.  Always use  sunscreen. Apply sunscreen liberally and repeat throughout the day.  Protect yourself by wearing long sleeves, pants, a wide-brimmed hat, and sunglasses when outside. What should I know about heart disease, diabetes, and high blood pressure?  If you are 45-67 years of age, have your blood pressure checked every 3-5 years. If you are 18 years of age or older, have your blood pressure checked every year. You should have your blood pressure measured twice-once when you are at a hospital or clinic, and once when you are not at a hospital or clinic. Record the average of the two measurements. To check your blood pressure when you are not at a hospital or clinic, you can use: ? An automated blood pressure machine at a pharmacy. ? A home blood pressure monitor.  Talk to your health care provider about your target blood pressure.  If you are between 57-67 years old, ask your health care provider if you should take aspirin to prevent heart disease.  Have regular diabetes screenings by checking your fasting blood sugar level. ? If you are at a normal weight and have a low risk for diabetes, have this test once every three years after the age of 64. ? If you are overweight and have a high risk for diabetes, consider being tested at a younger age or more often.  A one-time screening for abdominal aortic aneurysm (AAA) by ultrasound is recommended for men aged 45-75 years who are current or former smokers. What should I know about preventing infection? Hepatitis B If you have a higher risk for hepatitis B, you should be screened for this virus. Talk with your health care provider to find out if you are at risk for hepatitis B infection. Hepatitis C Blood testing is recommended for:  Everyone born from [birth year] through 1966.  Anyone with known risk factors for hepatitis C. Sexually Transmitted Diseases (STDs)  You should be screened each year for STDs including gonorrhea and chlamydia if: ? You are  sexually active and are younger than 64 years of age. ? You are older than 63 years of age and your health care provider tells you that you are at risk for this type of infection. ? Your sexual activity has changed since you were last screened and you are at an increased risk for chlamydia or gonorrhea. Ask your health care provider if you are at risk.  Talk with your health care provider about whether you are at high risk of being infected with HIV. Your health care provider may recommend a prescription medicine to help prevent HIV infection. What else can I do?  Schedule regular health, dental, and eye exams.  Stay current with your vaccines (immunizations).  Do not use any tobacco products, such as cigarettes, chewing tobacco, and e-cigarettes. If you need help quitting, ask your health care provider.  Limit alcohol intake to no more than 2 drinks per  day. One drink equals 12 ounces of beer, 5 ounces of Christia Coaxum, or 1 ounces of hard liquor.  Do not use street drugs.  Do not share needles.  Ask your health care provider for help if you need support or information about quitting drugs.  Tell your health care provider if you often feel depressed.  Tell your health care provider if you have ever been abused or do not feel safe at home. This information is not intended to replace advice given to you by your health care provider. Make sure you discuss any questions you have with your health care provider. Document Released: 12/05/2007 Document Revised: 02/05/2016 Document Reviewed: 03/12/2015 Elsevier Interactive Patient Education  2019 [employer].

## 2018-09-05 ENCOUNTER — Other Ambulatory Visit: Payer: Self-pay

## 2018-09-05 ENCOUNTER — Encounter: Payer: Self-pay | Admitting: Internal Medicine

## 2018-09-05 ENCOUNTER — Ambulatory Visit (INDEPENDENT_AMBULATORY_CARE_PROVIDER_SITE_OTHER): Payer: Medicare Other | Admitting: Internal Medicine

## 2018-09-05 VITALS — BP 138/80 | HR 58 | Ht 70.0 in | Wt 196.6 lb

## 2018-09-05 DIAGNOSIS — I5022 Chronic systolic (congestive) heart failure: Secondary | ICD-10-CM | POA: Diagnosis not present

## 2018-09-05 DIAGNOSIS — I428 Other cardiomyopathies: Secondary | ICD-10-CM

## 2018-09-05 NOTE — Progress Notes (Signed)
OFFICE NOTE  Chief Complaint:  No complaints  Primary Care Physician: Biagio Borg, MD  HPI:  Matthew Guerrero is a 63 y.o. male with a hx of HTN, HL, gout, asthma. He was seen in the emergency room in late March with abdominal pain and vomiting. ECG was abnormal and a fast scan demonstrated no pericardial effusion but global hypokinesis. Plan was to follow-up as an outpatient. However, he presented back to the emergency room with progressive shortness of breath and chest pain with exertion. Admitted 4/5-4/8 with acute systolic HF. EF was noted to be 15-20% on echo. Troponin was minimally elevated (0.14). He was diuresed. LHC was arranged and demonstrated normal coronary arteries. He was dx with NICM. Medications were adjusted for CHF and he returns for FU. Of note, ACE inhibitor was stopped and he was placed on Entresto.   Since DC, he is doing well. His breathing is improved. He is NYHA 2b. He denies orthopnea, PND, edema. He denies chest pain, syncope. Does admit to snoring and does take daytime naps. He has a non-productive cough.    Studies/Reports Reviewed Today:  Echo 09/27/14 - Mild LVH. EF 15% to 20%. Diffuse hypokinesis. There is akinesis of the entireanteroseptal myocardium. Grade 1 diastolic dysfunction. There is muscular trabeculation at apex of left ventricle (no obvious thrombus identified). Definity contrast used. - Mitral valve: There was mild regurgitation. - Left atrium: The atrium was moderately dilated. - Right ventricle: The cavity size was moderately dilated. Wall thickness was normal. - Right atrium: The atrium was mildly dilated.  LHC 09/27/14 The left main coronary artery is normal. The left anterior descending artery is normal. The left circumflex artery is normal. The right coronary artery is dominant and normal. EF: 15%.  [name] has an upcoming sleep study which should be helpful hopefully and may be contributing to his  erythrocytosis. I suspect the etiology of his heart failure is hypertensive heart disease. There remains to be seen whether he'll have improvement in LV function. He seems to be tolerating his medications at this point. He does get somewhat fatigued with walking up stairs and doing activities. I suspect a benefit from cardiac rehabilitation.  I saw [name] back in the office today. He is here for a limited echo follow-up which was performed on 02/08/2015. Unfortunately this shows the EF to to remain between 10 and 15%. You have his medication shows near optimal therapy as he is currently on aspirin, carvedilol, Lasix, and Entresto 49/51 mg. He seems to be tolerating the medications and has NYHA class II symptoms.  [name] returns today for follow-up. He recently had placement of an AICD for primary prevention of sudden cardiac death by Dr. Caryl Comes in 04-10-2023. This went well and since then he his not had any significant problems. He denies any device firings. He had one episode which was difficult to understand that happened in church but required some reprogramming. Weight is stable at 255 which was his weight in 2023-04-10. He continues to be compliant with CPAP and has an appointment to see Dr. Radford Pax for reevaluation in December. He is also seeing Dr. Caryl Comes back in January. He is currently applying for disability. It should be noted that he has class II symptoms and does get short of breath with moderate exertion and is unable to lift more than 10 pounds routinely. His previous job required more physical work than this.  10/25/2015  [name] returns today for follow-up. He's done exceedingly well.  He seems to be tolerating Entresto at the 49/50 one dose. His weight has been stable, in fact it is improved to 230 pounds. He denies any worsening shortness of breath. He currently has class 1-2 symptoms. He's had no AICD firings. He uses CPAP and is been compliant with that. He has a follow-up with  Dr. Radford Pax this summer. He also has an ICD clinic follow-up as well. He is requesting a handicap parking sticker today which I will provide given the significance of his cardiomyopathy.   07/27/2016  [name] was seen in the office for follow-up today. He continues to do very well. He is completely asymptomatic. Weight is now down further from 230 pounds to 217 pounds. He is on the middle dose of interest oh by hesitant to increase the dose higher due to his blood pressure. He is also on a high dose of carvedilol, spironolactone, aspirin and Lasix. He has had no indication of worsening lead impedance on his AICD remote checks. He denies any shocks. He continues to have class 1-2 symptoms. His last echo in August 2017 showed an EF of 15% without any significant improvement. He remains compliant with CPAP.  02/08/2017  [name] returns today for follow-up. He seems to be tolerating Entresto. Recently had an echo which showed improvement in LV function up from 15% to 25-30%. He does have an AICD in place. I've encouraged by the improvement in LV function which may be due to additional medical therapy. He's asymptomatic for most of the time with NYHA class 1-2 symptoms. Blood pressure is stable in the 120s over 70s. EKG shows a sinus rhythm with LVH and repolarization abnormality at 63-personally reviewed. Weight is been stable as well. He denies chest pain or worsening shortness of breath. He is compliant with his CPAP. He has an in office defibrillator check in September.  08/24/2017  [name] was seen today for follow-up.  He continues to do very well.  He is on high-dose Entresto.  He has had improvement in LV function up to 25-30% as of August 2018.  His AICD is followed by Dr. Caryl Comes and is working appropriately.  His weight has been stable.  He continues to have mostly class I to class II NYHA symptoms.  Blood pressure is at goal.  EKG is unchanged.  He is compliant with CPAP.  03/07/2018  Mr.  Matthew Guerrero is seen today in follow-up.  Overall he is doing well on his medications endorses NYHA class I symptoms.  He did have some interval improvement in LVEF up to 25 to 30% last year.  He has an AICD which has not fired and is seen by Dr. Caryl Comes.  He has an appointment with Dr. Caryl Comes tomorrow.  Blood pressures a goal cholesterol is been at goal.  He reports compliance with CPAP.  Overall he is doing well.  Weight is 3 pounds lighter than his last visit.  09/05/2018  [name] is seen today in follow-up.  He continues to do extremely well.  He has NYHA class I symptoms.  EF is somewhere around 25% based on his last echo.  He has a subcutaneous AICD which is not fired and is working appropriately.  He is also on CPAP for obstructive sleep apnea and sees Dr. Radford Pax again this summer.  He is compliant with this and finds that he sleeps well with good energy levels.  Weight is down 5 pounds since his last visit.  His LDL is less than 70  which is at target.  PMHx:  Past Medical History:  Diagnosis Date  . ALLERGIC RHINITIS   . Asthma   . Cervical radiculitis   . CHF (congestive heart failure) ([place])   . Chronic systolic heart failure ([place]) 02/28/2015  . GERD (gastroesophageal reflux disease)   . GOUT   . Hyperglycemia 08/28/2014  . HYPERLIPIDEMIA   . HYPERTENSION   . Lateral epicondylitis of right elbow   . Nonischemic cardiomyopathy ([place]) 09/28/2014  . Obesity (BMI 30-39.9) 06/06/2015  . S/P cardiac cath wjith normal coronary arteries  09/28/2014  . Sleep apnea     Past Surgical History:  Procedure Laterality Date  . COLONOSCOPY    . COLONOSCOPY WITH PROPOFOL N/A 06/17/2017   Procedure: COLONOSCOPY WITH PROPOFOL;  Surgeon: Milus Banister, MD;  Location: [facility] ENDOSCOPY;  Service: Endoscopy;  Laterality: N/A;  . EP IMPLANTABLE DEVICE N/A 03/21/2015   Procedure: SubQ ICD Implant;  Surgeon: Deboraha Sprang, MD;  Location: [place] CV LAB;  Service: Cardiovascular;  Laterality: N/A;  . LEFT AND  RIGHT HEART CATHETERIZATION WITH CORONARY ANGIOGRAM N/A 09/27/2014   Procedure: LEFT AND RIGHT HEART CATHETERIZATION WITH CORONARY ANGIOGRAM;  Surgeon: Belva Crome, MD;  Location: [facility] C[facility] LAB;  Service: Cardiovascular;  Laterality: N/A;  . SUBQ ICD  03/21/2015    FAMHx:  Family History  Problem Relation Age of Onset  . Lung cancer Mother   . Diabetes Father   . Heart disease Father   . Heart attack Father 72  . Hypertension Sister   . Aneurysm Sister 55       brain aneurysm  . Healthy Brother   . Healthy Brother   . Healthy Brother   . Colon cancer Neg Hx   . Esophageal cancer Neg Hx   . Rectal cancer Neg Hx   . Stomach cancer Neg Hx     SOCHx:   reports that he has never smoked. He has never used smokeless tobacco. He reports that he does not drink alcohol or use drugs.  ALLERGIES:  No Known Allergies  ROS: Pertinent items noted in HPI and remainder of comprehensive ROS otherwise negative.  HOME MEDS: Current Outpatient Medications  Medication Sig Dispense Refill  . aspirin 81 MG EC tablet Take 1 tablet (81 mg total) by mouth daily. 90 tablet 3  . carvedilol (COREG) 25 MG tablet Take 1 tablet (25 mg total) by mouth 2 (two) times daily with a meal. 180 tablet 3  . cetirizine (ZYRTEC) 10 MG tablet Take 1 tablet (10 mg total) by mouth daily. 30 tablet 11  . colchicine 0.6 MG tablet Take 1 tablet (0.6 mg total) by mouth daily. 90 tablet 3  . ENTRESTO 97-103 MG TAKE 1 TABLET BY MOUTH TWO  TIMES DAILY 180 tablet 3  . furosemide (LASIX) 40 MG tablet TAKE 1 TABLET BY MOUTH  DAILY 90 tablet 3  . methocarbamol (ROBAXIN) 750 MG tablet Take 1-2 tablets (750-1,500 mg total) by mouth 3 (three) times daily as needed for muscle spasms. 18 tablet 0  . pantoprazole (PROTONIX) 40 MG tablet TAKE 1 TABLET BY MOUTH  DAILY 90 tablet 3  . sildenafil (VIAGRA) 100 MG tablet Take 0.5-1 tablets (50-100 mg total) by mouth daily as needed for erectile dysfunction. 5 tablet 11  . spironolactone  (ALDACTONE) 25 MG tablet TAKE ONE-HALF TABLET BY  MOUTH DAILY 45 tablet 3  . triamcinolone (NASACORT) 55 MCG/ACT AERO nasal inhaler Place 2 sprays into the nose daily. 1 Inhaler  12  . pravastatin (PRAVACHOL) 40 MG tablet Take 1 tablet (40 mg total) by mouth every evening. 90 tablet 3   No current facility-administered medications for this visit.     LABS/IMAGING: No results found for this or any previous visit (from the past 48 hour(s)). No results found.  WEIGHTS: Wt Readings from Last 3 Encounters:  09/05/18 196 lb 9.6 oz (89.2 kg)  08/18/18 200 lb (90.7 kg)  03/29/18 202 lb (91.6 kg)    VITALS: BP 138/80   Pulse (!) 58   Ht 5\' 10"  (1.778 m)   Wt 196 lb 9.6 oz (89.2 kg)   BMI 28.21 kg/m   EXAM: General appearance: alert and no distress Neck: no carotid bruit, no JVD and thyroid not enlarged, symmetric, no tenderness/mass/nodules Lungs: clear to auscultation bilaterally Heart: regular rate and rhythm, S1, S2 normal, no murmur, click, rub or gallop and AICD pocket is intact Abdomen: soft, non-tender; bowel sounds normal; no masses,  no organomegaly Extremities: extremities normal, atraumatic, no cyanosis or edema Pulses: 2+ and symmetric Skin: Skin color, texture, turgor normal. No rashes or lesions Neurologic: Grossly normal Psych: Pleasant  EKG: Sinus bradycardia 58, incomplete left bundle branch block, voltage criteria for LVH at 58-personally reviewed  ASSESSMENT: 1. Nonischemic, probably hypertensive cardiomyopathy, EF 15% with normal coronaries-NYHA class I-II symptoms (EF improved to 25 - 02/2018) 2. Hypertension-controlled 3. Dyslipidemia 4. OSA on CPAP 5. Gout 6. Status post AICD for primary prevention of sudden cardiac death  PLAN: 1.   [name] is doing well symptomatically.  He is LVEF is stable around 25%.  He is on maximized doses of current guideline recommended therapy, including beta-blocker, Entresto, Aldactone, diuretics, statins and aspirin.  He  has an subcutaneous AICD for primary prevention of sudden cardiac death.  He is compliant with CPAP for treatment of sleep apnea.  No further recommendations at this time.  Plan follow-up with me annually or sooner as necessary.  Pixie Casino, MD, [facility], [place] Director of the Advanced Lipid Disorders &  Cardiovascular Risk Reduction Clinic Diplomate of the American Board of Clinical Lipidology Attending Cardiologist  Direct Dial: [phone]  Fax: [phone]  Website:  [email or web address] 09/05/2018, 1:31 PM

## 2018-09-05 NOTE — Patient Instructions (Signed)
Medication Instructions:  NOT NEEDED If you need a refill on your cardiac medications before your next appointment, please call your pharmacy.   Lab work: NOT NEEDED If you have labs (blood work) drawn today and your tests are completely normal, you will receive your results only by: [name] MyChart Message (if you have MyChart) OR . A paper copy in the mail If you have any lab test that is abnormal or we need to change your treatment, we will call you to review the results.  Testing/Procedures: NOT NEEDED  Follow-Up: At [facility], you and your health needs are our priority.  As part of our continuing mission to provide you with exceptional heart care, we have created designated Provider Care Teams.  These Care Teams include your primary Cardiologist (physician) and Advanced Practice Providers (APPs -  Physician Assistants and Nurse Practitioners) who all work together to provide you with the care you need, when you need it. You will need a follow up appointment in 12 months MARCH 2021.  Please call our office 2 months in advance to schedule this appointment.  You may see Pixie Casino, MD   or one of the following Advanced Practice Providers on your designated Care Team: [place], [place] . Fabian Sharp, PA-C  Any Other Special Instructions Will Be Listed Below (If Applicable). NOT NEEDED

## 2018-09-06 ENCOUNTER — Other Ambulatory Visit: Payer: Self-pay | Admitting: Internal Medicine

## 2018-09-06 DIAGNOSIS — E785 Hyperlipidemia, unspecified: Secondary | ICD-10-CM

## 2018-09-22 ENCOUNTER — Other Ambulatory Visit: Payer: Self-pay

## 2018-09-22 ENCOUNTER — Ambulatory Visit (INDEPENDENT_AMBULATORY_CARE_PROVIDER_SITE_OTHER): Payer: Medicare Other | Admitting: *Deleted

## 2018-09-22 DIAGNOSIS — I428 Other cardiomyopathies: Secondary | ICD-10-CM | POA: Diagnosis not present

## 2018-09-23 ENCOUNTER — Telehealth: Payer: Self-pay

## 2018-09-23 NOTE — Telephone Encounter (Signed)
Spoke with patient to remind of missed remote transmission 

## 2018-09-24 LAB — CUP PACEART REMOTE DEVICE CHECK
Battery Remaining Percentage: 60 %
Date Time Interrogation Session: 20200403193200
Implantable Lead Implant Date: 20160929
Implantable Lead Location: 753862
Implantable Lead Model: 3401
Implantable Pulse Generator Implant Date: 20160929
Pulse Gen Serial Number: 118449

## 2018-09-29 ENCOUNTER — Encounter: Payer: Self-pay | Admitting: Internal Medicine

## 2018-09-29 ENCOUNTER — Encounter: Payer: Self-pay | Admitting: Cardiology

## 2018-09-29 NOTE — Progress Notes (Signed)
Remote ICD transmission.   

## 2018-09-29 NOTE — Telephone Encounter (Signed)
error 

## 2018-10-12 ENCOUNTER — Telehealth: Payer: Self-pay | Admitting: Cardiology

## 2018-10-12 NOTE — Telephone Encounter (Signed)
New Message   Patient has appointment 6/12 and wants to know if it will be a virtual visit please call patient to help setup.

## 2018-10-13 NOTE — Telephone Encounter (Signed)
Follow Up:      Retuning your call.

## 2018-10-13 NOTE — Telephone Encounter (Signed)
Left message to call back  

## 2018-10-14 DIAGNOSIS — J1289 Other viral pneumonia: Secondary | ICD-10-CM | POA: Diagnosis not present

## 2018-10-14 NOTE — Telephone Encounter (Signed)
Spoke with the patient, advised him to wait to convert his visit since the office might be open in June.

## 2018-10-14 NOTE — Telephone Encounter (Signed)
Follow Up: ° ° ° °Returning your call from yesterday. °

## 2018-11-01 DIAGNOSIS — G4733 Obstructive sleep apnea (adult) (pediatric): Secondary | ICD-10-CM | POA: Diagnosis not present

## 2018-11-08 DIAGNOSIS — G4733 Obstructive sleep apnea (adult) (pediatric): Secondary | ICD-10-CM | POA: Diagnosis not present

## 2018-12-01 NOTE — Progress Notes (Signed)
Virtual Visit via Telephone Note   This visit type was conducted due to national recommendations for restrictions regarding the COVID-19 Pandemic (e.g. social distancing) in an effort to limit this patient's exposure and mitigate transmission in our community.  Due to his co-morbid illnesses, this patient is at least at moderate risk for complications without adequate follow up.  This format is felt to be most appropriate for this patient at this time.  All issues noted in this document were discussed and addressed.  A limited physical exam was performed with this format.  Please refer to the patient's chart for his consent to telehealth for [facility] .   Evaluation Performed:  Follow-up visit  This visit type was conducted due to national recommendations for restrictions regarding the COVID-19 Pandemic (e.g. social distancing).  This format is felt to be most appropriate for this patient at this time.  All issues noted in this document were discussed and addressed.  No physical exam was performed (except for noted visual exam findings with Video Visits).  Please refer to the patient's chart (MyChart message for video visits and phone note for telephone visits) for the patient's consent to telehealth for [facility].  Date:  12/02/2018   ID:  Matthew Guerrero, DOB February 23, 1956, MRN [id number]  Patient Location:  Home  Provider location:   [place]  PCP:  Biagio Borg, MD  Cardiologist:  Pixie Casino, MD  Sleep Medicine:  Fransico Him, MD Electrophysiologist:  None   Chief Complaint:  OSA  History of Present Illness:    Matthew Guerrero is a 63 y.o. male who presents via audio/video conferencing for a telehealth visit today.    Matthew Guerrero is a 63 y.o. male with a hx of moderate OSA with an AHI of 23/hr and mildto moderate snoring and associated nocturnal hypoxemia with his respiratory events as low as71%. He was titrated to 9 cm H2O.    At is last OV with me a year  ago he was complaining of mouth dryness and a chin strap was ordered .  He also felt like he was not getting enough air.  He was placed on auto CPAP which he has tolerated well.    He is doing well with his CPAP device and thinks that he has gotten used to it.  He tolerates the mask and feels the pressure is adequate.  Since going on CPAP he feels rested in the am and has no significant daytime sleepiness.  He denies any significant mouth or nasal dryness or nasal congestion.  He does not think that he snores.    The patient does not have symptoms concerning for COVID-19 infection (fever, chills, cough, or new shortness of breath).    Prior CV studies:   The following studies were reviewed today:  PAP compliance data  Past Medical History:  Diagnosis Date  . ALLERGIC RHINITIS   . Asthma   . Cervical radiculitis   . CHF (congestive heart failure) ([place])   . Chronic systolic heart failure ([place]) 02/28/2015  . GERD (gastroesophageal reflux disease)   . GOUT   . Hyperglycemia 08/28/2014  . HYPERLIPIDEMIA   . HYPERTENSION   . Lateral epicondylitis of right elbow   . Nonischemic cardiomyopathy ([place]) 09/28/2014  . Obesity (BMI 30-39.9) 06/06/2015  . S/P cardiac cath wjith normal coronary arteries  09/28/2014  . Sleep apnea    Past Surgical History:  Procedure Laterality Date  . COLONOSCOPY    . COLONOSCOPY WITH PROPOFOL N/A  06/17/2017   Procedure: COLONOSCOPY WITH PROPOFOL;  Surgeon: Milus Banister, MD;  Location: [facility] ENDOSCOPY;  Service: Endoscopy;  Laterality: N/A;  . EP IMPLANTABLE DEVICE N/A 03/21/2015   Procedure: SubQ ICD Implant;  Surgeon: Deboraha Sprang, MD;  Location: [place] CV LAB;  Service: Cardiovascular;  Laterality: N/A;  . LEFT AND RIGHT HEART CATHETERIZATION WITH CORONARY ANGIOGRAM N/A 09/27/2014   Procedure: LEFT AND RIGHT HEART CATHETERIZATION WITH CORONARY ANGIOGRAM;  Surgeon: Belva Crome, MD;  Location: [facility] CATH LAB;  Service: Cardiovascular;  Laterality: N/A;  . SUBQ  ICD  03/21/2015     Current Meds  Medication Sig  . aspirin 81 MG EC tablet Take 1 tablet (81 mg total) by mouth daily.  . carvedilol (COREG) 25 MG tablet TAKE 1 TABLET BY MOUTH TWO  TIMES DAILY WITH A MEAL  . cetirizine (ZYRTEC) 10 MG tablet Take 1 tablet (10 mg total) by mouth daily.  . colchicine 0.6 MG tablet Take 1 tablet (0.6 mg total) by mouth daily.  [name] ENTRESTO 97-103 MG TAKE 1 TABLET BY MOUTH TWO  TIMES DAILY  . furosemide (LASIX) 40 MG tablet TAKE 1 TABLET BY MOUTH  DAILY  . pantoprazole (PROTONIX) 40 MG tablet TAKE 1 TABLET BY MOUTH  DAILY  . pravastatin (PRAVACHOL) 40 MG tablet TAKE 1 TABLET BY MOUTH  EVERY EVENING  . spironolactone (ALDACTONE) 25 MG tablet TAKE ONE-HALF TABLET BY  MOUTH DAILY     Allergies:   Patient has no known allergies.   Social History   Tobacco Use  . Smoking status: Never Smoker  . Smokeless tobacco: Never Used  Substance Use Topics  . Alcohol use: No    Alcohol/week: 0.0 standard drinks  . Drug use: No     Family Hx: The patient's family history includes Aneurysm (age of onset: 74) in his sister; Diabetes in his father; Healthy in his brother, brother, and brother; Heart attack (age of onset: 68) in his father; Heart disease in his father; Hypertension in his sister; Lung cancer in his mother. There is no history of Colon cancer, Esophageal cancer, Rectal cancer, or Stomach cancer.  ROS:   Please see the history of present illness.     All other systems reviewed and are negative.   Labs/Other Tests and Data Reviewed:    Recent Labs: 03/08/2018: BUN 11; Creatinine, Ser 1.19; Potassium 4.2; Sodium 143 03/21/2018: ALT 17; Hemoglobin 15.4; Platelets 116.0; TSH 1.65   Recent Lipid Panel Lab Results  Component Value Date/Time   CHOL 140 03/21/2018 09:20 AM   CHOL 134 10/21/2016 02:38 PM   TRIG 174.0 (H) 03/21/2018 09:20 AM   HDL 38.00 (L) 03/21/2018 09:20 AM   HDL 35 (L) 10/21/2016 02:38 PM   CHOLHDL 4 03/21/2018 09:20 AM   LDLCALC 67  03/21/2018 09:20 AM   LDLCALC 68 10/21/2016 02:38 PM   LDLDIRECT 105.8 06/18/2010 08:06 AM    Wt Readings from Last 3 Encounters:  12/02/18 195 lb (88.5 kg)  09/05/18 196 lb 9.6 oz (89.2 kg)  08/18/18 200 lb (90.7 kg)     Objective:    Vital Signs:  BP 127/86   Pulse 76   Ht 5\' 10"  (1.778 m)   Wt 195 lb (88.5 kg)   BMI 27.98 kg/m   ASSESSMENT & PLAN:    1.  OSA - the patient is tolerating PAP therapy well without any problems. The PAP download was reviewed today and showed an AHI of 0.7/hr on 9 cm  H2O with 47% compliance in using more than 4 hours nightly.  The patient has been using and benefiting from PAP use and will continue to benefit from therapy. I encouraged him to use his device nightly.    2.  Hypertension - his BP is controlled.  He will continue on Carvedilol 25mg  BID, Entresto 97-103mg  BID and spiro 12.5mg  daily.     COVID-19 Education: The signs and symptoms of COVID-19 were discussed with the patient and how to seek care for testing (follow up with PCP or arrange E-visit).  The importance of social distancing was discussed today.  Patient Risk:   After full review of this patient's clinical status, I feel that they are at least moderate risk at this time.  Time:   Today, I have spent 15 minutes on telehealth medicine discussing medical problems including OSA, HTN, obesity.  We also reviewed the symptoms of COVID 19 and the ways to protect against contracting the virus with telehealth technology.  I spent an additional 5 minutes reviewing patient's chart including PAP compliance download.  Medication Adjustments/Labs and Tests Ordered: Current medicines are reviewed at length with the patient today.  Concerns regarding medicines are outlined above.  Tests Ordered: No orders of the defined types were placed in this encounter.  Medication Changes: No orders of the defined types were placed in this encounter.   Disposition:  Follow up in 1 year(s)  Signed,  Fransico Him, MD  12/02/2018 8:19 AM    [facility]

## 2018-12-02 ENCOUNTER — Telehealth (INDEPENDENT_AMBULATORY_CARE_PROVIDER_SITE_OTHER): Payer: Medicare Other | Admitting: Cardiology

## 2018-12-02 ENCOUNTER — Ambulatory Visit: Payer: Medicare Other | Admitting: Cardiology

## 2018-12-02 ENCOUNTER — Encounter: Payer: Self-pay | Admitting: Cardiology

## 2018-12-02 ENCOUNTER — Other Ambulatory Visit: Payer: Self-pay

## 2018-12-02 VITALS — BP 127/86 | HR 76 | Ht 70.0 in | Wt 195.0 lb

## 2018-12-02 DIAGNOSIS — G4733 Obstructive sleep apnea (adult) (pediatric): Secondary | ICD-10-CM

## 2018-12-02 DIAGNOSIS — E669 Obesity, unspecified: Secondary | ICD-10-CM

## 2018-12-02 DIAGNOSIS — I10 Essential (primary) hypertension: Secondary | ICD-10-CM | POA: Diagnosis not present

## 2018-12-02 NOTE — Patient Instructions (Signed)

## 2018-12-22 ENCOUNTER — Telehealth: Payer: Self-pay

## 2018-12-22 ENCOUNTER — Encounter: Payer: Medicare Other | Admitting: *Deleted

## 2018-12-22 NOTE — Telephone Encounter (Signed)
Left message for patient to remind of missed remote transmission.  

## 2018-12-26 ENCOUNTER — Other Ambulatory Visit: Payer: Self-pay | Admitting: Internal Medicine

## 2018-12-26 ENCOUNTER — Ambulatory Visit (INDEPENDENT_AMBULATORY_CARE_PROVIDER_SITE_OTHER): Payer: Medicare Other | Admitting: *Deleted

## 2018-12-26 DIAGNOSIS — I428 Other cardiomyopathies: Secondary | ICD-10-CM | POA: Diagnosis not present

## 2018-12-26 LAB — CUP PACEART REMOTE DEVICE CHECK
Battery Remaining Percentage: 57 %
Date Time Interrogation Session: 20200706121200
Implantable Lead Implant Date: 20160929
Implantable Lead Location: 753862
Implantable Lead Model: 3401
Implantable Pulse Generator Implant Date: 20160929
Pulse Gen Serial Number: 118449

## 2019-01-02 ENCOUNTER — Encounter: Payer: Self-pay | Admitting: Cardiology

## 2019-01-02 NOTE — Progress Notes (Signed)
Remote ICD transmission.   

## 2019-01-03 ENCOUNTER — Telehealth: Payer: Self-pay | Admitting: Internal Medicine

## 2019-01-03 ENCOUNTER — Encounter: Payer: Self-pay | Admitting: Internal Medicine

## 2019-01-03 MED ORDER — COLCHICINE 0.6 MG PO TABS: 0.6000 mg | ORAL_TABLET | Freq: Every day | ORAL | 1 refills | Status: DC

## 2019-01-03 NOTE — Telephone Encounter (Signed)
Patient informed that his medication was already refilled today by pcp and should be coming soon as it was sent to mail order. Patient verbally understands. No further questions.

## 2019-01-03 NOTE — Telephone Encounter (Signed)
New Message    *STAT* If patient is at the pharmacy, call can be transferred to refill team.   1. Which medications need to be refilled? (please list name of each medication and dose if known)  colchicine 0.6 MG tablet  2. Which pharmacy/location (including street and city if local pharmacy) is medication to be sent to? [place], [place]  3. Do they need a 30 day or 90 day supply? 90 day

## 2019-01-11 DIAGNOSIS — J1289 Other viral pneumonia: Secondary | ICD-10-CM | POA: Diagnosis not present

## 2019-02-02 DIAGNOSIS — G4733 Obstructive sleep apnea (adult) (pediatric): Secondary | ICD-10-CM | POA: Diagnosis not present

## 2019-02-08 NOTE — Assessment & Plan Note (Signed)
Negative JAK2 V617F mutation. Erythropoeitin 18.6 (mildly elevated) 10/26/14 Recommendation: 1. Most likely related to underlying cardiac disease 2. Phlebotomy is not the standard of care for secondary polycythemia. However if his Hb crosses 20, Or if he develops symptoms of congestion, we can consider doing it.  Lab review:   Thrombocytopenia: Suspicious for low grade ITP (Large platelets on smear suggest increased destruction). Today is .

## 2019-02-14 NOTE — Progress Notes (Signed)
Patient Care Team: Biagio Borg, MD as PCP - General (Internal Medicine) Debara Pickett Nadean Corwin, MD as PCP - Cardiology (Cardiology)  DIAGNOSIS:    ICD-10-CM   1. Polycythemia, secondary  D75.1 CBC with Differential (Cancer Center Only)    CHIEF COMPLIANT: Follow-up of secondary polycythemia and thrombocytopenia  INTERVAL HISTORY: Matthew Guerrero is a 63 y.o. with above-mentioned history of secondary polycythemia and low-grade ITP. I last saw him a year ago. He presents to the clinic today for annual follow-up.  He has been doing extremely well without any major health issues or concerns for the past year.  Denies any bruising or bleeding or any symptoms or signs of blood clots.  REVIEW OF SYSTEMS:   Constitutional: Denies fevers, chills or abnormal weight loss Eyes: Denies blurriness of vision Ears, nose, mouth, throat, and face: Denies mucositis or sore throat Respiratory: Denies cough, dyspnea or wheezes Cardiovascular: Denies palpitation, chest discomfort Gastrointestinal: Denies nausea, heartburn or change in bowel habits Skin: Denies abnormal skin rashes Lymphatics: Denies new lymphadenopathy or easy bruising Neurological: Denies numbness, tingling or new weaknesses Behavioral/Psych: Mood is stable, no new changes  Extremities: No lower extremity edema All other systems were reviewed with the patient and are negative.  I have reviewed the past medical history, past surgical history, social history and family history with the patient and they are unchanged from previous note.  ALLERGIES:  has No Known Allergies.  MEDICATIONS:  Current Outpatient Medications  Medication Sig Dispense Refill  . aspirin 81 MG EC tablet Take 1 tablet (81 mg total) by mouth daily. 90 tablet 3  . carvedilol (COREG) 25 MG tablet TAKE 1 TABLET BY MOUTH TWO  TIMES DAILY WITH A MEAL 180 tablet 3  . cetirizine (ZYRTEC) 10 MG tablet TAKE 1 TABLET(10 MG) BY MOUTH DAILY 30 tablet 5  . colchicine 0.6 MG  tablet Take 1 tablet (0.6 mg total) by mouth daily. 90 tablet 1  . ENTRESTO 97-103 MG TAKE 1 TABLET BY MOUTH TWO  TIMES DAILY 180 tablet 3  . furosemide (LASIX) 40 MG tablet TAKE 1 TABLET BY MOUTH  DAILY 90 tablet 3  . pantoprazole (PROTONIX) 40 MG tablet TAKE 1 TABLET BY MOUTH  DAILY 90 tablet 3  . pravastatin (PRAVACHOL) 40 MG tablet TAKE 1 TABLET BY MOUTH  EVERY EVENING 90 tablet 3  . spironolactone (ALDACTONE) 25 MG tablet TAKE ONE-HALF TABLET BY  MOUTH DAILY 45 tablet 3   No current facility-administered medications for this visit.     PHYSICAL EXAMINATION: ECOG PERFORMANCE STATUS: 1 - Symptomatic but completely ambulatory  Vitals:   02/15/19 0817  BP: 130/84  Pulse: 63  Resp: 18  Temp: 97.8 F (36.6 C)  SpO2: 99%   Filed Weights   02/15/19 0817  Weight: 174 lb 11.2 oz (79.2 kg)    GENERAL: alert, no distress and comfortable SKIN: skin color, texture, turgor are normal, no rashes or significant lesions EYES: normal, Conjunctiva are pink and non-injected, sclera clear OROPHARYNX: no exudate, no erythema and lips, buccal mucosa, and tongue normal  NECK: supple, thyroid normal size, non-tender, without nodularity LYMPH: no palpable lymphadenopathy in the cervical, axillary or inguinal LUNGS: clear to auscultation and percussion with normal breathing effort HEART: regular rate & rhythm and no murmurs and no lower extremity edema ABDOMEN: abdomen soft, non-tender and normal bowel sounds MUSCULOSKELETAL: no cyanosis of digits and no clubbing  NEURO: alert & oriented x 3 with fluent speech, no focal motor/sensory deficits EXTREMITIES: No lower  extremity edema  LABORATORY DATA:  I have reviewed the data as listed CMP Latest Ref Rng & Units 03/21/2018 03/08/2018 03/29/2017  Glucose 65 - 99 mg/dL - 99 102(H)  BUN 8 - 27 mg/dL - 11 10  Creatinine 0.76 - 1.27 mg/dL - 1.19 1.08  Sodium 134 - 144 mmol/L - 143 140  Potassium 3.5 - 5.2 mmol/L - 4.2 3.9  Chloride 96 - 106 mmol/L -  102 102  CO2 20 - 29 mmol/L - 24 30  Calcium 8.6 - 10.2 mg/dL - 9.6 9.6  Total Protein 6.0 - 8.3 g/dL 6.8 - 7.3  Total Bilirubin 0.2 - 1.2 mg/dL 0.5 - 0.9  Alkaline Phos 39 - 117 U/L 46 - 46  AST 0 - 37 U/L 15 - 17  ALT 0 - 53 U/L 17 - 20    Lab Results  Component Value Date   WBC 6.5 02/15/2019   HGB 14.5 02/15/2019   HCT 45.2 02/15/2019   MCV 89.5 02/15/2019   PLT 149 (L) 02/15/2019   NEUTROABS 3.6 02/15/2019    ASSESSMENT & PLAN:  Polycythemia, secondary Negative JAK2 V617F mutation. Erythropoeitin 18.6 (mildly elevated) 10/26/14 Recommendation: 1. Most likely related to underlying cardiac disease 2. Phlebotomy is not the standard of care for secondary polycythemia. However if his Hb crosses 20, Or if he develops symptoms of congestion, we can consider doing it.  Lab review:  Platelet count has improved to 149.  Hemoglobin is steady at 14.5 and therefore does not require any phlebotomy.  Thrombocytopenia: Suspicious for low grade ITP (Large platelets on smear suggest increased destruction). Today is 149.  Return to clinic in 1 year with labs and follow-up.  Orders Placed This Encounter  Procedures  . CBC with Differential (Cancer Center Only)    Standing Status:   Future    Standing Expiration Date:   02/15/2020   The patient has a good understanding of the overall plan. he agrees with it. he will call with any problems that may develop before the next visit here.  Nicholas Lose, MD 02/15/2019  Julious Oka Dorshimer am acting as scribe for Dr. Nicholas Lose.  I have reviewed the above documentation for accuracy and completeness, and I agree with the above.

## 2019-02-15 ENCOUNTER — Inpatient Hospital Stay: Payer: Medicare Other | Attending: Hematology and Oncology

## 2019-02-15 ENCOUNTER — Other Ambulatory Visit: Payer: Self-pay

## 2019-02-15 ENCOUNTER — Inpatient Hospital Stay (HOSPITAL_BASED_OUTPATIENT_CLINIC_OR_DEPARTMENT_OTHER): Payer: Medicare Other | Admitting: Hematology and Oncology

## 2019-02-15 DIAGNOSIS — D693 Immune thrombocytopenic purpura: Secondary | ICD-10-CM | POA: Diagnosis not present

## 2019-02-15 DIAGNOSIS — D751 Secondary polycythemia: Secondary | ICD-10-CM

## 2019-02-15 LAB — CBC WITH DIFFERENTIAL (CANCER CENTER ONLY)
Abs Immature Granulocytes: 0.08 10*3/uL — ABNORMAL HIGH (ref 0.00–0.07)
Basophils Absolute: 0 10*3/uL (ref 0.0–0.1)
Basophils Relative: 1 %
Eosinophils Absolute: 0.1 10*3/uL (ref 0.0–0.5)
Eosinophils Relative: 1 %
HCT: 45.2 % (ref 39.0–52.0)
Hemoglobin: 14.5 g/dL (ref 13.0–17.0)
Immature Granulocytes: 1 %
Lymphocytes Relative: 35 %
Lymphs Abs: 2.3 10*3/uL (ref 0.7–4.0)
MCH: 28.7 pg (ref 26.0–34.0)
MCHC: 32.1 g/dL (ref 30.0–36.0)
MCV: 89.5 fL (ref 80.0–100.0)
Monocytes Absolute: 0.5 10*3/uL (ref 0.1–1.0)
Monocytes Relative: 7 %
Neutro Abs: 3.6 10*3/uL (ref 1.7–7.7)
Neutrophils Relative %: 55 %
Platelet Count: 149 10*3/uL — ABNORMAL LOW (ref 150–400)
RBC: 5.05 MIL/uL (ref 4.22–5.81)
RDW: 13.4 % (ref 11.5–15.5)
WBC Count: 6.5 10*3/uL (ref 4.0–10.5)
nRBC: 0 % (ref 0.0–0.2)

## 2019-02-16 ENCOUNTER — Telehealth: Payer: Self-pay | Admitting: Hematology and Oncology

## 2019-02-16 NOTE — Telephone Encounter (Signed)
I talk with patient regarding schedule  

## 2019-03-27 ENCOUNTER — Ambulatory Visit (INDEPENDENT_AMBULATORY_CARE_PROVIDER_SITE_OTHER): Payer: Medicare Other | Admitting: *Deleted

## 2019-03-27 DIAGNOSIS — I5022 Chronic systolic (congestive) heart failure: Secondary | ICD-10-CM

## 2019-03-27 DIAGNOSIS — I428 Other cardiomyopathies: Secondary | ICD-10-CM

## 2019-03-28 LAB — CUP PACEART REMOTE DEVICE CHECK
Battery Remaining Percentage: 54 %
Date Time Interrogation Session: 20201005115800
Implantable Lead Implant Date: 20160929
Implantable Lead Location: 753862
Implantable Lead Model: 3401
Implantable Pulse Generator Implant Date: 20160929
Pulse Gen Serial Number: 118449

## 2019-03-30 NOTE — Progress Notes (Signed)
Cardiology Office Note Date:  03/31/2019  Patient ID:  Matthew Guerrero, Matthew Guerrero [birth year], MRN [id number] PCP:  Biagio Borg, MD  Cardiologist:  Dr. Debara Pickett Electrophysiologist: Dr. Caryl Comes OSA: Dr. Radford Pax   Chief Complaint:  annual device visit  History of Present Illness: Matthew Guerrero is a 63 y.o. male with history of HTN, HLD, gout, asthma, NICM, S-ICD, OSA w/CPAP, chronic CHF (systolic)  The patient comes in today to be seen for Dr. Caryl Comes.  He last saw hi in Sept 2019, doing well. Dr. Caryl Comes makes note that he had TWOS in the secondary vector and programmed in the alternate.  He saw Dr. Debara Pickett in March, was doing well with class I symptoms.  He feels very well/  Denies any CP, palpitations or SOB, no DOE or exertional intolerances, no symptoms of PND or orthopnea.  NO near syncope or syncope, no shocks.  His weight has fluctuated and he mentions that he he had taken a detoxify regime/pills a few months back that he lost several pounds with and then another pill that apparently was to reset everything back and regain his weight but in the right way. I have cautioned him on taking such regimes without MD guidance and clearance.  Device information BSCi S-ICD, implanted 03/21/15, Dr. Caryl Comes   Past Medical History:  Diagnosis Date  . ALLERGIC RHINITIS   . Asthma   . Cervical radiculitis   . CHF (congestive heart failure) ([place])   . Chronic systolic heart failure ([place]) 02/28/2015  . GERD (gastroesophageal reflux disease)   . GOUT   . Hyperglycemia 08/28/2014  . HYPERLIPIDEMIA   . HYPERTENSION   . Lateral epicondylitis of right elbow   . Nonischemic cardiomyopathy ([place]) 09/28/2014  . Obesity (BMI 30-39.9) 06/06/2015  . S/P cardiac cath wjith normal coronary arteries  09/28/2014  . Sleep apnea     Past Surgical History:  Procedure Laterality Date  . COLONOSCOPY    . COLONOSCOPY WITH PROPOFOL N/A 06/17/2017   Procedure: COLONOSCOPY WITH PROPOFOL;  Surgeon: Milus Banister, MD;   Location: [facility];  Service: Endoscopy;  Laterality: N/A;  . EP IMPLANTABLE DEVICE N/A 03/21/2015   Procedure: SubQ ICD Implant;  Surgeon: Deboraha Sprang, MD;  Location: [place] CV LAB;  Service: Cardiovascular;  Laterality: N/A;  . LEFT AND RIGHT HEART CATHETERIZATION WITH CORONARY ANGIOGRAM N/A 09/27/2014   Procedure: LEFT AND RIGHT HEART CATHETERIZATION WITH CORONARY ANGIOGRAM;  Surgeon: Belva Crome, MD;  Location: [facility] CATH LAB;  Service: Cardiovascular;  Laterality: N/A;  . SUBQ ICD  03/21/2015    Current Outpatient Medications  Medication Sig Dispense Refill  . carvedilol (COREG) 25 MG tablet TAKE 1 TABLET BY MOUTH TWO  TIMES DAILY WITH A MEAL 180 tablet 3  . cetirizine (ZYRTEC) 10 MG tablet TAKE 1 TABLET(10 MG) BY MOUTH DAILY 30 tablet 5  . colchicine 0.6 MG tablet Take 1 tablet (0.6 mg total) by mouth daily. 90 tablet 1  . ENTRESTO 97-103 MG TAKE 1 TABLET BY MOUTH TWO  TIMES DAILY 180 tablet 3  . furosemide (LASIX) 40 MG tablet TAKE 1 TABLET BY MOUTH  DAILY 90 tablet 3  . pantoprazole (PROTONIX) 40 MG tablet TAKE 1 TABLET BY MOUTH  DAILY 90 tablet 3  . pravastatin (PRAVACHOL) 40 MG tablet TAKE 1 TABLET BY MOUTH  EVERY EVENING 90 tablet 3  . spironolactone (ALDACTONE) 25 MG tablet TAKE ONE-HALF TABLET BY  MOUTH DAILY 45 tablet 3  . aspirin 81 MG EC tablet  Take 1 tablet (81 mg total) by mouth daily. 90 tablet 3   No current facility-administered medications for this visit.     Allergies:   Patient has no known allergies.   Social History:  The patient  reports that he has never smoked. He has never used smokeless tobacco. He reports that he does not drink alcohol or use drugs.   Family History:  The patient's family history includes Aneurysm (age of onset: 1) in his sister; Diabetes in his father; Healthy in his brother, brother, and brother; Heart attack (age of onset: 56) in his father; Heart disease in his father; Hypertension in his sister; Lung cancer in his mother.   ROS:  Please see the history of present illness.  All other systems are reviewed and otherwise negative.   PHYSICAL EXAM:  VS:  BP 124/80   Pulse 83   Ht 5\' 10"  (1.778 m)   Wt 188 lb (85.3 kg)   SpO2 95%   BMI 26.98 kg/m  BMI: Body mass index is 26.98 kg/m. Well nourished, well developed, in no acute distress  HEENT: normocephalic, atraumatic  Neck: no JVD, carotid bruits or masses Cardiac:  RRR; no significant murmurs, no rubs, or gallops Lungs:  CTA b/l, no wheezing, rhonchi or rales  Abd: soft, nontender MS: no deformity or atrophy Ext:  no edema  Skin: warm and dry, no rash Neuro:  No gross deficits appreciated Psych: euthymic mood, full affect  S-ICD site is stable, no tethering or discomfort   EKG:  Not done today ICD interrogation done today and reviewed by myself: battery is at 54%, no arrhythmias or therapies, impedance is OK   03/14/18: TTE Study Conclusions - Left ventricle: The cavity size was severely dilated. Wall   thickness was normal. The estimated ejection fraction was 25%.   Diffuse hypokinesis. - Atrial septum: No defect or patent foramen ovale was identified.    Recent Labs: 02/15/2019: Hemoglobin 14.5; Platelet Count 149  No results found for requested labs within last 8760 hours.   CrCl cannot be calculated (Patient's most recent lab result is older than the maximum 21 days allowed.).   Wt Readings from Last 3 Encounters:  03/31/19 188 lb (85.3 kg)  02/15/19 174 lb 11.2 oz (79.2 kg)  12/02/18 195 lb (88.5 kg)     Other studies reviewed: Additional studies/records reviewed today include: summarized above  ASSESSMENT AND PLAN:  1. S-ICD     Intact function/testing  2. NICM 3. Chronic CHF (systolic)     No symptoms or exam findings to suggest fluid OL     Follows with Dr. Debara Pickett   4. HTN     Looks OK, no changes    Disposition: F/u with remotes as usual, one year in clinic, BMET today.    Current medicines are reviewed at  length with the patient today.  The patient did not have any concerns regarding medicines.  Venetia Night, PA-C 03/31/2019 2:28 PM     [place] [address] [place] [id number] [phone] (office)  [phone] (fax)

## 2019-03-31 ENCOUNTER — Other Ambulatory Visit: Payer: Self-pay

## 2019-03-31 ENCOUNTER — Encounter: Payer: Medicare Other | Admitting: Internal Medicine

## 2019-03-31 ENCOUNTER — Ambulatory Visit (INDEPENDENT_AMBULATORY_CARE_PROVIDER_SITE_OTHER): Payer: Medicare Other | Admitting: Physician Assistant

## 2019-03-31 VITALS — BP 124/80 | HR 83 | Ht 70.0 in | Wt 188.0 lb

## 2019-03-31 DIAGNOSIS — I428 Other cardiomyopathies: Secondary | ICD-10-CM | POA: Diagnosis not present

## 2019-03-31 DIAGNOSIS — Z95810 Presence of automatic (implantable) cardiac defibrillator: Secondary | ICD-10-CM | POA: Diagnosis not present

## 2019-03-31 DIAGNOSIS — Z79899 Other long term (current) drug therapy: Secondary | ICD-10-CM | POA: Diagnosis not present

## 2019-03-31 DIAGNOSIS — I5022 Chronic systolic (congestive) heart failure: Secondary | ICD-10-CM

## 2019-03-31 NOTE — Patient Instructions (Addendum)
Medication Instructions:   Your physician recommends that you continue on your current medications as directed. Please refer to the Current Medication list given to you today.] If you need a refill on your cardiac medications before your next appointment, please call your pharmacy.   Lab work: BMET   If you have labs (blood work) drawn today and your tests are completely normal, you will receive your results only by: [name] MyChart Message (if you have MyChart) OR . A paper copy in the mail If you have any lab test that is abnormal or we need to change your treatment, we will call you to review the results.  Testing/Procedures: NONE ORDERED  TODAY    Follow-Up: At [facility], you and your health needs are our priority.  As part of our continuing mission to provide you with exceptional heart care, we have created designated Provider Care Teams.  These Care Teams include your primary Cardiologist (physician) and Advanced Practice Providers (APPs -  Physician Assistants and Nurse Practitioners) who all work together to provide you with the care you need, when you need it. You will need a follow up appointment in 1 years.  Please call our office 2 months in advance to schedule this appointment.  You may see None or one of the following Advanced Practice Providers on your designated Care Team:   Chanetta Marshall, NP . Tommye Standard, PA-C  Any Other Special Instructions Will Be Listed Below (If Applicable).

## 2019-04-01 LAB — BASIC METABOLIC PANEL
BUN/Creatinine Ratio: 13 (ref 10–24)
BUN: 14 mg/dL (ref 8–27)
CO2: 21 mmol/L (ref 20–29)
Calcium: 9.3 mg/dL (ref 8.6–10.2)
Chloride: 101 mmol/L (ref 96–106)
Creatinine, Ser: 1.1 mg/dL (ref 0.76–1.27)
GFR calc Af Amer: 82 mL/min/{1.73_m2} (ref >59)
GFR calc non Af Amer: 71 mL/min/{1.73_m2} (ref >59)
Glucose: 89 mg/dL (ref 65–99)
Potassium: 4.1 mmol/L (ref 3.5–5.2)
Sodium: 141 mmol/L (ref 134–144)

## 2019-04-03 NOTE — Progress Notes (Signed)
Remote ICD transmission.   

## 2019-04-04 ENCOUNTER — Ambulatory Visit (INDEPENDENT_AMBULATORY_CARE_PROVIDER_SITE_OTHER): Payer: Medicare Other | Admitting: Internal Medicine

## 2019-04-04 ENCOUNTER — Other Ambulatory Visit (INDEPENDENT_AMBULATORY_CARE_PROVIDER_SITE_OTHER): Payer: Medicare Other

## 2019-04-04 ENCOUNTER — Encounter: Payer: Self-pay | Admitting: Internal Medicine

## 2019-04-04 ENCOUNTER — Other Ambulatory Visit: Payer: Self-pay

## 2019-04-04 VITALS — BP 126/84 | HR 85 | Temp 98.4°F | Ht 70.0 in | Wt 192.0 lb

## 2019-04-04 DIAGNOSIS — R7303 Prediabetes: Secondary | ICD-10-CM

## 2019-04-04 DIAGNOSIS — Z0000 Encounter for general adult medical examination without abnormal findings: Secondary | ICD-10-CM

## 2019-04-04 DIAGNOSIS — E785 Hyperlipidemia, unspecified: Secondary | ICD-10-CM

## 2019-04-04 DIAGNOSIS — I428 Other cardiomyopathies: Secondary | ICD-10-CM

## 2019-04-04 LAB — PSA: PSA: 0.73 ng/mL (ref 0.10–4.00)

## 2019-04-04 LAB — HEPATIC FUNCTION PANEL
ALT: 12 U/L (ref 0–53)
AST: 14 U/L (ref 0–37)
Albumin: 4.5 g/dL (ref 3.5–5.2)
Alkaline Phosphatase: 46 U/L (ref 39–117)
Bilirubin, Direct: 0.1 mg/dL (ref 0.0–0.3)
Total Bilirubin: 0.6 mg/dL (ref 0.2–1.2)
Total Protein: 6.9 g/dL (ref 6.0–8.3)

## 2019-04-04 LAB — CBC WITH DIFFERENTIAL/PLATELET
Basophils Absolute: 0 10*3/uL (ref 0.0–0.1)
Basophils Relative: 0.7 % (ref 0.0–3.0)
Eosinophils Absolute: 0.2 10*3/uL (ref 0.0–0.7)
Eosinophils Relative: 2.5 % (ref 0.0–5.0)
HCT: 49 % (ref 39.0–52.0)
Hemoglobin: 16.1 g/dL (ref 13.0–17.0)
Lymphocytes Relative: 35.9 % (ref 12.0–46.0)
Lymphs Abs: 2.6 10*3/uL (ref 0.7–4.0)
MCHC: 32.9 g/dL (ref 30.0–36.0)
MCV: 89.8 fl (ref 78.0–100.0)
Monocytes Absolute: 0.6 10*3/uL (ref 0.1–1.0)
Monocytes Relative: 7.7 % (ref 3.0–12.0)
Neutro Abs: 3.9 10*3/uL (ref 1.4–7.7)
Neutrophils Relative %: 53.2 % (ref 43.0–77.0)
Platelets: 126 10*3/uL — ABNORMAL LOW (ref 150.0–400.0)
RBC: 5.46 Mil/uL (ref 4.22–5.81)
RDW: 14.1 % (ref 11.5–15.5)
WBC: 7.2 10*3/uL (ref 4.0–10.5)

## 2019-04-04 LAB — BASIC METABOLIC PANEL
BUN: 8 mg/dL (ref 6–23)
CO2: 30 mEq/L (ref 19–32)
Calcium: 9.7 mg/dL (ref 8.4–10.5)
Chloride: 102 mEq/L (ref 96–112)
Creatinine, Ser: 1.04 mg/dL (ref 0.40–1.50)
GFR: 86.84 mL/min (ref >60.00)
Glucose, Bld: 96 mg/dL (ref 70–99)
Potassium: 3.8 mEq/L (ref 3.5–5.1)
Sodium: 139 mEq/L (ref 135–145)

## 2019-04-04 LAB — LIPID PANEL
Cholesterol: 157 mg/dL (ref 0–200)
HDL: 41.5 mg/dL (ref >39.00)
LDL Cholesterol: 84 mg/dL (ref 0–99)
NonHDL: 115.54
Total CHOL/HDL Ratio: 4
Triglycerides: 160 mg/dL — ABNORMAL HIGH (ref 0.0–149.0)
VLDL: 32 mg/dL (ref 0.0–40.0)

## 2019-04-04 LAB — TSH: TSH: 1.6 u[IU]/mL (ref 0.35–4.50)

## 2019-04-04 LAB — HEMOGLOBIN A1C: Hgb A1c MFr Bld: 6 % (ref 4.6–6.5)

## 2019-04-04 MED ORDER — PRAVASTATIN SODIUM 40 MG PO TABS: 40.0000 mg | ORAL_TABLET | Freq: Every evening | ORAL | 3 refills | Status: DC

## 2019-04-04 MED ORDER — CARVEDILOL 25 MG PO TABS: ORAL_TABLET | ORAL | 3 refills | Status: DC

## 2019-04-04 MED ORDER — COLCHICINE 0.6 MG PO TABS: 0.6000 mg | ORAL_TABLET | Freq: Every day | ORAL | 3 refills | Status: DC

## 2019-04-04 MED ORDER — CETIRIZINE HCL 10 MG PO TABS: ORAL_TABLET | ORAL | 5 refills | Status: DC

## 2019-04-04 MED ORDER — SPIRONOLACTONE 25 MG PO TABS: 12.5000 mg | ORAL_TABLET | Freq: Every day | ORAL | 3 refills | Status: DC

## 2019-04-04 MED ORDER — PANTOPRAZOLE SODIUM 40 MG PO TBEC: 40.0000 mg | DELAYED_RELEASE_TABLET | Freq: Every day | ORAL | 3 refills | Status: DC

## 2019-04-04 NOTE — Patient Instructions (Signed)

## 2019-04-04 NOTE — Progress Notes (Signed)
Subjective:    Patient ID: Matthew Guerrero, male    DOB: 1955-08-26, 63 y.o.   MRN: [id number]  HPI  Here for wellness and f/u;  Overall doing ok;  Pt denies Chest pain, worsening SOB, DOE, wheezing, orthopnea, PND, worsening LE edema, palpitations, dizziness or syncope.  Pt denies neurological change such as new headache, facial or extremity weakness.  Pt denies polydipsia, polyuria, or low sugar symptoms. Pt states overall good compliance with treatment and medications, good tolerability, and has been trying to follow appropriate diet.  Pt denies worsening depressive symptoms, suicidal ideation or panic. No fever, night sweats, wt loss, loss of appetite, or other constitutional symptoms.  Pt states good ability with ADL's, has low fall risk, home safety reviewed and adequate, no other significant changes in hearing or vision, and only occasionally active with exercise. No new complaints Past Medical History:  Diagnosis Date  . ALLERGIC RHINITIS   . Asthma   . Cervical radiculitis   . CHF (congestive heart failure) ([place])   . Chronic systolic heart failure ([place]) 02/28/2015  . GERD (gastroesophageal reflux disease)   . GOUT   . Hyperglycemia 08/28/2014  . HYPERLIPIDEMIA   . HYPERTENSION   . Lateral epicondylitis of right elbow   . Nonischemic cardiomyopathy ([place]) 09/28/2014  . Obesity (BMI 30-39.9) 06/06/2015  . S/P cardiac cath wjith normal coronary arteries  09/28/2014  . Sleep apnea    Past Surgical History:  Procedure Laterality Date  . COLONOSCOPY    . COLONOSCOPY WITH PROPOFOL N/A 06/17/2017   Procedure: COLONOSCOPY WITH PROPOFOL;  Surgeon: Milus Banister, MD;  Location: [facility];  Service: Endoscopy;  Laterality: N/A;  . EP IMPLANTABLE DEVICE N/A 03/21/2015   Procedure: SubQ ICD Implant;  Surgeon: Deboraha Sprang, MD;  Location: [place] CV LAB;  Service: Cardiovascular;  Laterality: N/A;  . LEFT AND RIGHT HEART CATHETERIZATION WITH CORONARY ANGIOGRAM N/A 09/27/2014   Procedure:  LEFT AND RIGHT HEART CATHETERIZATION WITH CORONARY ANGIOGRAM;  Surgeon: Belva Crome, MD;  [facility];  Service: Cardiovascular;  Laterality: N/A;  . SUBQ ICD  03/21/2015    reports that he has never smoked. He has never used smokeless tobacco. He reports that he does not drink alcohol or use drugs. family history includes Aneurysm (age of onset: 59) in his sister; Diabetes in his father; Healthy in his brother, brother, and brother; Heart attack (age of onset: 15) in his father; Heart disease in his father; Hypertension in his sister; Lung cancer in his mother. No Known Allergies Current Outpatient Medications on File Prior to Visit  Medication Sig Dispense Refill  . ENTRESTO 97-103 MG TAKE 1 TABLET BY MOUTH TWO  TIMES DAILY 180 tablet 3  . furosemide (LASIX) 40 MG tablet TAKE 1 TABLET BY MOUTH  DAILY 90 tablet 3   No current facility-administered medications on file prior to visit.    Review of Systems Constitutional: Negative for other unusual diaphoresis, sweats, appetite or weight changes HENT: Negative for other worsening hearing loss, ear pain, facial swelling, mouth sores or neck stiffness.   Eyes: Negative for other worsening pain, redness or other visual disturbance.  Respiratory: Negative for other stridor or swelling Cardiovascular: Negative for other palpitations or other chest pain  Gastrointestinal: Negative for worsening diarrhea or loose stools, blood in stool, distention or other pain Genitourinary: Negative for hematuria, flank pain or other change in urine volume.  Musculoskeletal: Negative for myalgias or other joint swelling.  Skin: Negative for  other color change, or other wound or worsening drainage.  Neurological: Negative for other syncope or numbness. Hematological: Negative for other adenopathy or swelling Psychiatric/Behavioral: Negative for hallucinations, other worsening agitation, SI, self-injury, or new decreased concentration All otherwise neg  per pt    Objective:   Physical Exam BP 126/84   Pulse 85   Temp 98.4 F (36.9 C) (Oral)   Ht 5\' 10"  (1.778 m)   Wt 192 lb (87.1 kg)   SpO2 95%   BMI 27.55 kg/m  VS noted,  Constitutional: Pt is oriented to person, place, and time. Appears well-developed and well-nourished, in no significant distress and comfortable Head: Normocephalic and atraumatic  Eyes: Conjunctivae and EOM are normal. Pupils are equal, round, and reactive to light Right Ear: External ear normal without discharge Left Ear: External ear normal without discharge Nose: Nose without discharge or deformity Mouth/Throat: Oropharynx is without other ulcerations and moist  Neck: Normal range of motion. Neck supple. No JVD present. No tracheal deviation present or significant neck LA or mass Cardiovascular: Normal rate, regular rhythm, normal heart sounds and intact distal pulses.   Pulmonary/Chest: WOB normal and breath sounds without rales or wheezing  Abdominal: Soft. Bowel sounds are normal. NT. No HSM  Musculoskeletal: Normal range of motion. Exhibits no edema Lymphadenopathy: Has no other cervical adenopathy.  Neurological: Pt is alert and oriented to person, place, and time. Pt has normal reflexes. No cranial nerve deficit. Motor grossly intact, Gait intact Skin: Skin is warm and dry. No rash noted or new ulcerations Psychiatric:  Has normal mood and affect. Behavior is normal without agitation All otherwise neg per pt Lab Results  Component Value Date   WBC 7.2 04/04/2019   HGB 16.1 04/04/2019   HCT 49.0 04/04/2019   PLT 126.0 (L) 04/04/2019   GLUCOSE 96 04/04/2019   CHOL 157 04/04/2019   TRIG 160.0 (H) 04/04/2019   HDL 41.50 04/04/2019   LDLDIRECT 105.8 06/18/2010   LDLCALC 84 04/04/2019   ALT 12 04/04/2019   AST 14 04/04/2019   NA 139 04/04/2019   K 3.8 04/04/2019   CL 102 04/04/2019   CREATININE 1.04 04/04/2019   BUN 8 04/04/2019   CO2 30 04/04/2019   TSH 1.60 04/04/2019   PSA 0.73 04/04/2019    INR 1.2 (H) 03/15/2015   HGBA1C 6.0 04/04/2019       Assessment & Plan:

## 2019-04-05 LAB — URINALYSIS, ROUTINE W REFLEX MICROSCOPIC
Bilirubin Urine: NEGATIVE
Hgb urine dipstick: NEGATIVE
Ketones, ur: NEGATIVE
Leukocytes,Ua: NEGATIVE
Nitrite: NEGATIVE
RBC / HPF: NONE SEEN (ref 0–?)
Specific Gravity, Urine: 1.015 (ref 1.000–1.030)
Total Protein, Urine: NEGATIVE
Urine Glucose: NEGATIVE
Urobilinogen, UA: 0.2 (ref 0.0–1.0)
pH: 7 (ref 5.0–8.0)

## 2019-04-08 ENCOUNTER — Encounter: Payer: Self-pay | Admitting: Internal Medicine

## 2019-04-08 NOTE — Assessment & Plan Note (Signed)

## 2019-04-08 NOTE — Assessment & Plan Note (Signed)
stable overall by history and exam, recent data reviewed with pt, and pt to continue medical treatment as before,  to f/u any worsening symptoms or concerns  

## 2019-05-13 ENCOUNTER — Other Ambulatory Visit: Payer: Self-pay

## 2019-05-13 DIAGNOSIS — Z20822 Contact with and (suspected) exposure to covid-19: Secondary | ICD-10-CM

## 2019-05-14 ENCOUNTER — Other Ambulatory Visit: Payer: Self-pay | Admitting: Internal Medicine

## 2019-05-15 ENCOUNTER — Telehealth: Payer: Self-pay

## 2019-05-15 ENCOUNTER — Encounter: Payer: Self-pay | Admitting: Internal Medicine

## 2019-05-15 LAB — NOVEL CORONAVIRUS, NAA: SARS-CoV-2, NAA: DETECTED — AB

## 2019-05-15 NOTE — Telephone Encounter (Signed)
Pt notified of positive COVID-19 test results. Pt verbalized understanding. Pt reports that he doesn't have any symtpoms.Pt advised to remain in self quarantine until at least 10 days since symptom onset And 3 consecutive days fever free without antipyretics And improvement in respiratory symptoms. Patient advised to utilize over the counter medications to treat symptoms. Pt advised to seek treatment in the ED if respiratory issues/distress develops.Pt advised they should only leave home to seek and medical care and must wear a mask in public. Pt instructed to limit contact with family members or caregivers in the home. Pt advised to practice social distancing and to continue to use good preventative care measures such has frequent hand washing, staying out of crowds and cleaning hard surfaces frequently touched in the home.Pt informed that the health department will likely follow up and may have additional recommendations. Will notify [facility] Department.

## 2019-05-22 DIAGNOSIS — Z1159 Encounter for screening for other viral diseases: Secondary | ICD-10-CM | POA: Diagnosis not present

## 2019-05-30 ENCOUNTER — Telehealth: Payer: Self-pay | Admitting: Pharmacy Technician

## 2019-05-30 ENCOUNTER — Other Ambulatory Visit: Payer: Self-pay | Admitting: Pharmacist

## 2019-05-30 DIAGNOSIS — Z7251 High risk heterosexual behavior: Secondary | ICD-10-CM

## 2019-05-30 DIAGNOSIS — Z114 Encounter for screening for human immunodeficiency virus [HIV]: Secondary | ICD-10-CM

## 2019-05-30 DIAGNOSIS — Z113 Encounter for screening for infections with a predominantly sexual mode of transmission: Secondary | ICD-10-CM

## 2019-05-30 DIAGNOSIS — Z79899 Other long term (current) drug therapy: Secondary | ICD-10-CM

## 2019-05-30 NOTE — Progress Notes (Signed)
Patient coming in for PrEP labs. He is a new patient. Will call and explain process and medication once his labs have returned.

## 2019-05-30 NOTE — Telephone Encounter (Signed)
[name], English as a foreign language completed.    The patient is insured through [employer] and has a $8.95 copay for [employer].  We will continue to follow to see if copay assistance is needed.  Matthew Guerrero. [name] [place] Patient [facility] Phone: [phone] Fax:  [phone]

## 2019-05-31 ENCOUNTER — Other Ambulatory Visit: Payer: Self-pay

## 2019-05-31 ENCOUNTER — Other Ambulatory Visit: Payer: Medicare Other

## 2019-05-31 ENCOUNTER — Other Ambulatory Visit (HOSPITAL_COMMUNITY)
Admission: RE | Admit: 2019-05-31 | Discharge: 2019-05-31 | Disposition: A | Discharge status: Home / Self Care | Payer: Medicare Other | Source: Ambulatory Visit | Attending: Infectious Disease | Admitting: Infectious Disease

## 2019-05-31 DIAGNOSIS — Z114 Encounter for screening for human immunodeficiency virus [HIV]: Secondary | ICD-10-CM

## 2019-05-31 DIAGNOSIS — Z7251 High risk heterosexual behavior: Secondary | ICD-10-CM

## 2019-05-31 DIAGNOSIS — Z113 Encounter for screening for infections with a predominantly sexual mode of transmission: Secondary | ICD-10-CM | POA: Diagnosis present

## 2019-06-01 LAB — RPR: RPR Ser Ql: NONREACTIVE

## 2019-06-01 LAB — HIV ANTIBODY (ROUTINE TESTING W REFLEX): HIV 1&2 Ab, 4th Generation: NONREACTIVE

## 2019-06-01 LAB — HEPATITIS C ANTIBODY
Hepatitis C Ab: NONREACTIVE
SIGNAL TO CUT-OFF: 0.01 (ref <1.00)

## 2019-06-01 LAB — HEPATITIS A ANTIBODY, TOTAL: Hepatitis A AB,Total: NONREACTIVE

## 2019-06-01 LAB — HEPATITIS B SURFACE ANTIGEN: Hepatitis B Surface Ag: NONREACTIVE

## 2019-06-01 LAB — HEPATITIS B SURFACE ANTIBODY,QUALITATIVE: Hep B S Ab: REACTIVE — AB

## 2019-06-02 ENCOUNTER — Telehealth: Payer: Self-pay | Admitting: Pharmacist

## 2019-06-02 ENCOUNTER — Other Ambulatory Visit: Payer: Self-pay | Admitting: Pharmacist

## 2019-06-02 ENCOUNTER — Telehealth: Payer: Self-pay | Admitting: Pharmacy Technician

## 2019-06-02 DIAGNOSIS — Z7252 High risk homosexual behavior: Secondary | ICD-10-CM

## 2019-06-02 LAB — URINE CYTOLOGY ANCILLARY ONLY
Chlamydia: NEGATIVE
Comment: NEGATIVE
Comment: NORMAL
Neisseria Gonorrhea: NEGATIVE

## 2019-06-02 MED ORDER — DESCOVY 200-25 MG PO TABS: 1.0000 | ORAL_TABLET | Freq: Every day | ORAL | 2 refills | Status: DC

## 2019-06-02 MED FILL — DESCOVY 200-25 MG TABS: 200-25 | 30 days supply | Qty: 30 | Fill #0

## 2019-06-02 NOTE — Telephone Encounter (Signed)
Trisha is the partner of a HIV positive patient of ours and he is interested in PrEP.  Patient came in on 12/9 and got PrEP labs drawn and his HIV test was negative.  Called patient today to discuss PrEP.  He is only sexually active with his one partner who is HIV positive and taking Symtuza. His HIV positive partner had been out of care for awhile, last HIV viral load in March and was 15,000. Patient states that he urges him to take his medication every day and is helping him stay on top of things. His partner sees [place]. He states that they use condoms.  Discussed the PrEP process here at the clinic and how it works. He is interested and wishes to start.   Counseled patient that Descovy is a one pill once daily regimen with or without food that can prevent HIV. Discussed the importance of taking the medication daily to provide protection and decreased adherence is associated with decreased efficacy. Also discussed how Descovy works to prevent HIV but not other STDs and encouraged the use of condoms. Counseled on what to do if dose is missed, if closer to missed dose take immediately, if closer to next dose then skip and resume normal schedule.  Counseled patient that Descovy is normally well tolerated, however some patients experience a "start up syndrome" with nausea, diarrhea, dizziness, and fatigue but that those should resolve soon after starting.  Advised that any nausea can be mitigated by taking it with food. I reviewed patient medications and found no drug interactions. Discussed how our PrEP process works here at the clinic including follow ups and lab monitoring every 3 months.  Will send in Descovy to [facility] and they will mail it to his house.  I will follow up with him in 3 months on 08/29/2019.

## 2019-06-02 NOTE — Progress Notes (Signed)
hiv + partner on medication Yes condoms  Income - 1220/month, ss 1 person No veteran No pay taxes, disability SSN-[id number]   [place]

## 2019-06-02 NOTE — Telephone Encounter (Signed)
[facility] Patient Advocate Encounter   I was successful in securing patient a $7500 grant from [employer] (PAF) to provide copayment coverage for Descovy.  This will make the out of pocket cost $0.    The billing information is as follows and has been shared with [place].   RxBin: [id number] PCN:   [id number] Member ID: [id number] Group ID: [id number] Dates of Eligibility: 06/02/2019 through 06/01/2020  Inez Catalina E. [name] [place] Patient [facility] Phone: [phone] Fax:  [phone]

## 2019-06-26 ENCOUNTER — Ambulatory Visit (INDEPENDENT_AMBULATORY_CARE_PROVIDER_SITE_OTHER): Payer: Medicare Other | Admitting: *Deleted

## 2019-06-26 DIAGNOSIS — I5022 Chronic systolic (congestive) heart failure: Secondary | ICD-10-CM | POA: Diagnosis not present

## 2019-06-26 LAB — CUP PACEART REMOTE DEVICE CHECK
Battery Remaining Percentage: 52 %
Date Time Interrogation Session: 20210104084500
Implantable Lead Implant Date: 20160929
Implantable Lead Location: 753862
Implantable Lead Model: 3401
Implantable Pulse Generator Implant Date: 20160929
Pulse Gen Serial Number: 118449

## 2019-06-28 MED FILL — DESCOVY 200-25 MG TABS: 200-25 | 30 days supply | Qty: 30 | Fill #1

## 2019-07-11 ENCOUNTER — Other Ambulatory Visit: Payer: Self-pay | Admitting: Internal Medicine

## 2019-07-14 ENCOUNTER — Encounter: Payer: Self-pay | Admitting: Internal Medicine

## 2019-07-27 MED FILL — DESCOVY 200-25 MG TABS: 200-25 | 30 days supply | Qty: 30 | Fill #2

## 2019-07-31 NOTE — Progress Notes (Addendum)
Cardiology Office Note Date:  07/31/2019  Patient ID:  Matthew Guerrero, Matthew Guerrero June 25, 1955, MRN [id number] PCP:  Biagio Borg, MD  Cardiologist:  Dr. Debara Pickett Electrophysiologist: Dr. Caryl Comes OSA: Dr. Radford Pax   Chief Complaint:  pt called to come in and check battery and his heart  History of Present Illness: Matthew Guerrero is a 64 y.o. male with history of HTN, HLD, gout, asthma, NICM, S-ICD, OSA w/CPAP, chronic CHF (systolic)  The patient comes in today to be seen for Dr. Caryl Comes.  He last saw hi in Sept 2019, doing well. Dr. Caryl Comes makes note that he had TWOS in the secondary vector and programmed in the alternate.  He saw Dr. Debara Pickett in March, was doing well with class I symptoms.  I saw him Oct 2020, he felt very well.  Denied any CP, palpitations or SOB, no DOE or exertional intolerances, no symptoms of PND or orthopnea.  No near syncope or syncope, no shocks. His weight had fluctuated and he mentions that he he had taken a detoxify regime/pills a few months back that he lost several pounds with and then another pill that apparently was to reset everything back and regain his weight but in the right way. I cautioned him on taking such regimes without MD guidance and clearance. His device interrogation was normal, 54% battery used  He feels quite well.  No CP, palpitations or exertional incapacities.  He denies any rest or exertional SOB, no symptoms of PND or orthopnea. No dizzy spells, near syncope or syncope. He weighs daily and reports stable weight He is tolerating his medicines, reports Delene Loll is covered well for him  Device information BSCi S-ICD, implanted 03/21/15, Dr. Caryl Comes Device lead sensing configuration is on alternate 2/2 TWOS Implanted for primary prevention I do not see h/o of any appropriate therapies Device is on early battery depletion advisory He is enrolled in [employer] home monitoring  Past Medical History:  Diagnosis Date  . ALLERGIC RHINITIS   . Asthma   .  Cervical radiculitis   . CHF (congestive heart failure) ([place])   . Chronic systolic heart failure ([place]) 02/28/2015  . GERD (gastroesophageal reflux disease)   . GOUT   . Hyperglycemia 08/28/2014  . HYPERLIPIDEMIA   . HYPERTENSION   . Lateral epicondylitis of right elbow   . Nonischemic cardiomyopathy ([place]) 09/28/2014  . Obesity (BMI 30-39.9) 06/06/2015  . S/P cardiac cath wjith normal coronary arteries  09/28/2014  . Sleep apnea     Past Surgical History:  Procedure Laterality Date  . COLONOSCOPY    . COLONOSCOPY WITH PROPOFOL N/A 06/17/2017   Procedure: COLONOSCOPY WITH PROPOFOL;  Surgeon: Milus Banister, MD;  Location: [facility];  Service: Endoscopy;  Laterality: N/A;  . EP IMPLANTABLE DEVICE N/A 03/21/2015   Procedure: SubQ ICD Implant;  Surgeon: Deboraha Sprang, MD;  Location: [facility];  Service: Cardiovascular;  Laterality: N/A;  . LEFT AND RIGHT HEART CATHETERIZATION WITH CORONARY ANGIOGRAM N/A 09/27/2014   Procedure: LEFT AND RIGHT HEART CATHETERIZATION WITH CORONARY ANGIOGRAM;  Surgeon: Belva Crome, MD;  Location: [facility] CATH LAB;  Service: Cardiovascular;  Laterality: N/A;  . SUBQ ICD  03/21/2015    Current Outpatient Medications  Medication Sig Dispense Refill  . carvedilol (COREG) 25 MG tablet TAKE 1 TABLET BY MOUTH TWO  TIMES DAILY WITH A MEAL 180 tablet 3  . cetirizine (ZYRTEC) 10 MG tablet TAKE 1 TABLET(10 MG) BY MOUTH DAILY 30 tablet 5  . colchicine 0.6 MG  tablet Take 1 tablet (0.6 mg total) by mouth daily. 90 tablet 3  . emtricitabine-tenofovir AF (DESCOVY) 200-25 MG tablet Take 1 tablet by mouth daily. 30 tablet 2  . ENTRESTO 97-103 MG TAKE 1 TABLET BY MOUTH TWO  TIMES DAILY 180 tablet 3  . furosemide (LASIX) 40 MG tablet TAKE 1 TABLET BY MOUTH  DAILY 90 tablet 3  . pantoprazole (PROTONIX) 40 MG tablet Take 1 tablet (40 mg total) by mouth daily. 90 tablet 3  . pravastatin (PRAVACHOL) 40 MG tablet Take 1 tablet (40 mg total) by mouth every evening. 90 tablet 3  .  spironolactone (ALDACTONE) 25 MG tablet Take 0.5 tablets (12.5 mg total) by mouth daily. 45 tablet 3   No current facility-administered medications for this visit.    Allergies:   Patient has no known allergies.   Social History:  The patient  reports that he has never smoked. He has never used smokeless tobacco. He reports that he does not drink alcohol or use drugs.   Family History:  The patient's family history includes Aneurysm (age of onset: 20) in his sister; Diabetes in his father; Healthy in his brother, brother, and brother; Heart attack (age of onset: 41) in his father; Heart disease in his father; Hypertension in his sister; Lung cancer in his mother.  ROS:  Please see the history of present illness.  All other systems are reviewed and otherwise negative.   PHYSICAL EXAM:  VS:  There were no vitals taken for this visit. BMI: There is no height or weight on file to calculate BMI. Well nourished, well developed, in no acute distress  HEENT: normocephalic, atraumatic  Neck: no JVD, carotid bruits or masses Cardiac:  RRR; no significant murmurs, no rubs, or gallops Lungs: CTA b/l, no wheezing, rhonchi or rales  Abd: soft, nontender MS: no deformity or atrophy Ext:   no edema  Skin: warm and dry, no rash Neuro:  No gross deficits appreciated Psych: euthymic mood, full affect  S-ICD site is stable, no tethering or discomfort   EKG:  Done today and reviewed by myself:  SR 77bpm,   ICD interrogation done today and reviewed by myself:  Battery is at 50% No arrhythmias or therapies, electrode impedance is ok Jan 2021 battery 52% Oct 2020 battery 54%  03/14/18: TTE Study Conclusions - Left ventricle: The cavity size was severely dilated. Wall   thickness was normal. The estimated ejection fraction was 25%.   Diffuse hypokinesis. - Atrial septum: No defect or patent foramen ovale was identified.    Recent Labs: 04/04/2019: ALT 12; BUN 8; Creatinine, Ser 1.04;  Hemoglobin 16.1; Platelets 126.0; Potassium 3.8; Sodium 139; TSH 1.60  04/04/2019: Cholesterol 157; HDL 41.50; LDL Cholesterol 84; Total CHOL/HDL Ratio 4; Triglycerides 160.0; VLDL 32.0   CrCl cannot be calculated (Patient's most recent lab result is older than the maximum 21 days allowed.).   Wt Readings from Last 3 Encounters:  04/04/19 192 lb (87.1 kg)  03/31/19 188 lb (85.3 kg)  02/15/19 174 lb 11.2 oz (79.2 kg)     Other studies reviewed: Additional studies/records reviewed today include: summarized above  ASSESSMENT AND PLAN:  1. S-ICD     Device is included in early depletion advisory     BSCi rep available here and checked his device with me today, Battery status is stable today     we discussed the advisory of his device and BSCi recommendations for monitoring     Alert tone was demonstrated  Discussed importance remote monitoring     he is enrolled and doing remotes       2. NICM 3. Chronic CHF (systolic)     No symptoms or exam findings to suggest fluid OL     Follows with Dr. Debara Pickett     Will get BMET today   4. HTN     Looks OK, no changes    Disposition: he will see Dr. Debara Pickett as scheduled to keep up to date with him, we will continue Q 3 mo repotes and see him back in clinic in 31mo, sooner if needed    Current medicines are reviewed at length with the patient today.  The patient did not have any concerns regarding medicines.  Venetia Night, PA-C 07/31/2019 9:24 AM     [place] [address] [place] [address] [phone] (office)  [phone] (fax)

## 2019-08-02 ENCOUNTER — Other Ambulatory Visit: Payer: Self-pay

## 2019-08-02 ENCOUNTER — Ambulatory Visit (INDEPENDENT_AMBULATORY_CARE_PROVIDER_SITE_OTHER): Payer: Medicare Other | Admitting: Physician Assistant

## 2019-08-02 VITALS — BP 136/84 | HR 77 | Ht 70.0 in | Wt 194.0 lb

## 2019-08-02 DIAGNOSIS — I5022 Chronic systolic (congestive) heart failure: Secondary | ICD-10-CM

## 2019-08-02 DIAGNOSIS — Z79899 Other long term (current) drug therapy: Secondary | ICD-10-CM

## 2019-08-02 DIAGNOSIS — I10 Essential (primary) hypertension: Secondary | ICD-10-CM | POA: Diagnosis not present

## 2019-08-02 DIAGNOSIS — I428 Other cardiomyopathies: Secondary | ICD-10-CM

## 2019-08-02 DIAGNOSIS — Z95810 Presence of automatic (implantable) cardiac defibrillator: Secondary | ICD-10-CM | POA: Diagnosis not present

## 2019-08-02 LAB — BASIC METABOLIC PANEL
BUN/Creatinine Ratio: 7 — ABNORMAL LOW (ref 10–24)
BUN: 15 mg/dL (ref 8–27)
CO2: 26 mmol/L (ref 20–29)
Calcium: 9.4 mg/dL (ref 8.6–10.2)
Chloride: 100 mmol/L (ref 96–106)
Creatinine, Ser: 2.3 mg/dL — ABNORMAL HIGH (ref 0.76–1.27)
GFR calc Af Amer: 33 mL/min/{1.73_m2} — ABNORMAL LOW (ref >59)
GFR calc non Af Amer: 29 mL/min/{1.73_m2} — ABNORMAL LOW (ref >59)
Glucose: 142 mg/dL — ABNORMAL HIGH (ref 65–99)
Potassium: 4.4 mmol/L (ref 3.5–5.2)
Sodium: 141 mmol/L (ref 134–144)

## 2019-08-02 NOTE — Patient Instructions (Addendum)
Medication Instructions:  Your physician recommends that you continue on your current medications as directed. Please refer to the Current Medication list given to you today.  *If you need a refill on your cardiac medications before your next appointment, please call your pharmacy*  Lab Work: BMET TODAY   If you have labs (blood work) drawn today and your tests are completely normal, you will receive your results only by: [name] MyChart Message (if you have MyChart) OR . A paper copy in the mail If you have any lab test that is abnormal or we need to change your treatment, we will call you to review the results.  Testing/Procedures:  NONE ORDERED  TODAY   Follow-Up:  At [facility], you and your health needs are our priority.  As part of our continuing mission to provide you with exceptional heart care, we have created designated Provider Care Teams.  These Care Teams include your primary Cardiologist (physician) and Advanced Practice Providers (APPs -  Physician Assistants and Nurse Practitioners) who all work together to provide you with the care you need, when you need it.  Your next appointment:   6 months   The format for your next appointment:   In Person  Provider:    You may see Dr. Caryl Comes  or one of the following Advanced Practice Providers on your designated Care Team:    Chanetta Marshall, NP  Tommye Standard, PA-C  Legrand Como "Oda [place], [place]   Other Instructions

## 2019-08-03 ENCOUNTER — Other Ambulatory Visit: Payer: Self-pay | Admitting: *Deleted

## 2019-08-03 DIAGNOSIS — Z79899 Other long term (current) drug therapy: Secondary | ICD-10-CM

## 2019-08-03 MED ORDER — FUROSEMIDE 40 MG PO TABS: 20.0000 mg | ORAL_TABLET | Freq: Every day | ORAL | 3 refills | Status: DC

## 2019-08-03 NOTE — Addendum Note (Signed)
Addended by: Claude Manges on: 08/03/2019 02:37 PM   Modules accepted: Orders

## 2019-08-10 ENCOUNTER — Other Ambulatory Visit: Payer: Medicare Other

## 2019-08-14 ENCOUNTER — Other Ambulatory Visit: Payer: Self-pay

## 2019-08-14 ENCOUNTER — Other Ambulatory Visit: Payer: Medicare Other | Admitting: *Deleted

## 2019-08-14 ENCOUNTER — Other Ambulatory Visit: Payer: Medicare Other

## 2019-08-14 DIAGNOSIS — Z79899 Other long term (current) drug therapy: Secondary | ICD-10-CM

## 2019-08-14 LAB — BASIC METABOLIC PANEL
BUN/Creatinine Ratio: 10 (ref 10–24)
BUN: 12 mg/dL (ref 8–27)
CO2: 23 mmol/L (ref 20–29)
Calcium: 9.5 mg/dL (ref 8.6–10.2)
Chloride: 104 mmol/L (ref 96–106)
Creatinine, Ser: 1.26 mg/dL (ref 0.76–1.27)
GFR calc Af Amer: 69 mL/min/{1.73_m2} (ref >59)
GFR calc non Af Amer: 60 mL/min/{1.73_m2} (ref >59)
Glucose: 132 mg/dL — ABNORMAL HIGH (ref 65–99)
Potassium: 4 mmol/L (ref 3.5–5.2)
Sodium: 143 mmol/L (ref 134–144)

## 2019-08-21 ENCOUNTER — Ambulatory Visit: Payer: Medicare Other

## 2019-08-22 ENCOUNTER — Ambulatory Visit: Payer: Medicare Other

## 2019-08-29 ENCOUNTER — Other Ambulatory Visit: Payer: Self-pay

## 2019-08-29 ENCOUNTER — Ambulatory Visit (INDEPENDENT_AMBULATORY_CARE_PROVIDER_SITE_OTHER): Payer: Medicare Other | Admitting: Pharmacist

## 2019-08-29 DIAGNOSIS — Z79899 Other long term (current) drug therapy: Secondary | ICD-10-CM

## 2019-08-29 NOTE — Progress Notes (Signed)
Date:  08/29/2019   HPI: Matthew Guerrero is a 64 y.o. male who presents to the [facility] for 3 month PrEP follow-up.  Insured   [x]    Uninsured  []    Patient Active Problem List   Diagnosis Date Noted  . Benign neoplasm of descending colon   . Benign neoplasm of ascending colon   . ICD (implantable cardioverter-defibrillator) in place 02/08/2017  . Contusion of right great toe without damage to nail 12/14/2016  . Erectile dysfunction 07/17/2016  . Degenerative arthritis of left knee 03/03/2016  . GERD (gastroesophageal reflux disease) 03/03/2016  . Preventative health care 08/29/2015  . Obesity (BMI 30-39.9) 06/06/2015  . Polycythemia, secondary 05/21/2015  . NICM (nonischemic cardiomyopathy) ([place]) 03/21/2015  . Chronic systolic heart failure ([place]) 02/28/2015  . Excessive daytime sleepiness 12/16/2014  . OSA (obstructive sleep apnea) 12/16/2014  . Nonischemic cardiomyopathy ([place]) 09/28/2014  . S/P cardiac cath wjith normal coronary arteries  09/28/2014  . Erythrocytosis and decreased platelet count. 09/28/2014  . Musculoskeletal chest pain 09/25/2014  . Elevated troponin 09/25/2014  . Prediabetes 08/28/2014  . Cervical radiculitis 01/02/2012  . Hyperlipidemia 07/03/2010  . GOUT 07/01/2009  . Essential hypertension 07/01/2009  . Allergic rhinitis 07/01/2009  . History of adenomatous polyp of colon 07/01/2009    Patient's Medications  New Prescriptions   No medications on file  Previous Medications   CARVEDILOL (COREG) 25 MG TABLET    TAKE 1 TABLET BY MOUTH TWO  TIMES DAILY WITH A MEAL   CETIRIZINE (ZYRTEC) 10 MG TABLET    TAKE 1 TABLET(10 MG) BY MOUTH DAILY   COLCHICINE 0.6 MG TABLET    Take 1 tablet (0.6 mg total) by mouth daily.   EMTRICITABINE-TENOFOVIR AF (DESCOVY) 200-25 MG TABLET    Take 1 tablet by mouth daily.   ENTRESTO 97-103 MG    TAKE 1 TABLET BY MOUTH TWO  TIMES DAILY   FUROSEMIDE (LASIX) 40 MG TABLET    Take 0.5 tablets (20 mg total) by mouth  daily.   PANTOPRAZOLE (PROTONIX) 40 MG TABLET    Take 1 tablet (40 mg total) by mouth daily.   PRAVASTATIN (PRAVACHOL) 40 MG TABLET    Take 1 tablet (40 mg total) by mouth every evening.   SPIRONOLACTONE (ALDACTONE) 25 MG TABLET    Take 0.5 tablets (12.5 mg total) by mouth daily.  Modified Medications   No medications on file  Discontinued Medications   No medications on file    Allergies: No Known Allergies  Past Medical History: Past Medical History:  Diagnosis Date  . ALLERGIC RHINITIS   . Asthma   . Cervical radiculitis   . CHF (congestive heart failure) ([place])   . Chronic systolic heart failure ([place]) 02/28/2015  . GERD (gastroesophageal reflux disease)   . GOUT   . Hyperglycemia 08/28/2014  . HYPERLIPIDEMIA   . HYPERTENSION   . Lateral epicondylitis of right elbow   . Nonischemic cardiomyopathy ([place]) 09/28/2014  . Obesity (BMI 30-39.9) 06/06/2015  . S/P cardiac cath wjith normal coronary arteries  09/28/2014  . Sleep apnea     Social History: Social History   Socioeconomic History  . Marital status: Single    Spouse name: Not on file  . Number of children: Not on file  . Years of education: Not on file  . Highest education level: Not on file  Occupational History  . Occupation: retired  Tobacco Use  . Smoking status: Never Smoker  . Smokeless tobacco: Never Used  Substance and Sexual Activity  . Alcohol use: No    Alcohol/week: 0.0 standard drinks  . Drug use: No  . Sexual activity: Not Currently  Other Topics Concern  . Not on file  Social History Narrative  . Not on file   Social Determinants of Health   Financial Resource Strain:   . Difficulty of Paying Living Expenses: Not on file  Food Insecurity:   . Worried About Charity fundraiser in the Last Year: Not on file  . Ran Out of Food in the Last Year: Not on file  Transportation Needs:   . Lack of Transportation (Medical): Not on file  . Lack of Transportation (Non-Medical): Not on file  Physical  Activity:   . Days of Exercise per Week: Not on file  . Minutes of Exercise per Session: Not on file  Stress:   . Feeling of Stress : Not on file  Social Connections:   . Frequency of Communication with Friends and Family: Not on file  . Frequency of Social Gatherings with Friends and Family: Not on file  . Attends Religious Services: Not on file  . Active Member of Clubs or Organizations: Not on file  . Attends Archivist Meetings: Not on file  . Marital Status: Not on file    No flowsheet data found.  Labs:  SCr: Lab Results  Component Value Date   CREATININE 1.26 08/14/2019   CREATININE 2.30 (H) 08/02/2019   CREATININE 1.04 04/04/2019   CREATININE 1.10 03/31/2019   CREATININE 1.19 03/08/2018   HIV Lab Results  Component Value Date   HIV NON-REACTIVE 05/31/2019   HIV NON-REACTIVE 03/29/2017   Hepatitis B Lab Results  Component Value Date   HEPBSAB REACTIVE (A) 05/31/2019   HEPBSAG NON-REACTIVE 05/31/2019   Hepatitis C Lab Results  Component Value Date   HEPCAB NON-REACTIVE 05/31/2019   Hepatitis A Lab Results  Component Value Date   HAV NON-REACTIVE 05/31/2019   RPR and STI Lab Results  Component Value Date   LABRPR NON-REACTIVE 05/31/2019    STI Results GC CT  05/31/2019 Negative Negative    Assessment: Matthew Guerrero is here today for his 3 month PrEP follow up. He is taking Descovy every day with no side effects or missed doses.  He remains with one partner who is HIV positive and on Symtuza. He does state that his partner has been in and out of care and on and off of his Matthew Guerrero but is currently taking it and seeing Matthew Guerrero here at our office.  No concerns for anything today. Will check labs and see him back in 3 months.  Plan: - HIV antibody tdoay - Descovy x 3 months if HIV negative - F/u in 3 months  Ewa Hipp L. Odessia Asleson, PharmD, BCIDP, AAHIVP, CPP Clinical Pharmacist Practitioner Infectious Diseases [facility] 08/29/2019, 2:51 PM

## 2019-08-30 ENCOUNTER — Other Ambulatory Visit: Payer: Self-pay | Admitting: Pharmacist

## 2019-08-30 ENCOUNTER — Encounter: Payer: Self-pay | Admitting: Pharmacist

## 2019-08-30 DIAGNOSIS — Z7252 High risk homosexual behavior: Secondary | ICD-10-CM

## 2019-08-30 LAB — HIV ANTIBODY (ROUTINE TESTING W REFLEX): HIV 1&2 Ab, 4th Generation: NONREACTIVE

## 2019-08-30 MED ORDER — DESCOVY 200-25 MG PO TABS: 1.0000 | ORAL_TABLET | Freq: Every day | ORAL | 2 refills | Status: DC

## 2019-08-30 NOTE — Progress Notes (Signed)
Patient's HIV antibody is negative.  Will send in 3 more months of Descovy to [facility].  

## 2019-09-01 DIAGNOSIS — H5203 Hypermetropia, bilateral: Secondary | ICD-10-CM | POA: Diagnosis not present

## 2019-09-01 DIAGNOSIS — H2513 Age-related nuclear cataract, bilateral: Secondary | ICD-10-CM | POA: Diagnosis not present

## 2019-09-01 DIAGNOSIS — H52203 Unspecified astigmatism, bilateral: Secondary | ICD-10-CM | POA: Diagnosis not present

## 2019-09-01 MED FILL — DESCOVY 200-25 MG TABS: 200-25 | 30 days supply | Qty: 30 | Fill #0

## 2019-09-05 ENCOUNTER — Telehealth (INDEPENDENT_AMBULATORY_CARE_PROVIDER_SITE_OTHER): Payer: Medicare Other | Admitting: Internal Medicine

## 2019-09-05 ENCOUNTER — Encounter: Payer: Self-pay | Admitting: Internal Medicine

## 2019-09-05 VITALS — BP 124/80 | HR 83 | Wt 188.0 lb

## 2019-09-05 DIAGNOSIS — Z95810 Presence of automatic (implantable) cardiac defibrillator: Secondary | ICD-10-CM

## 2019-09-05 DIAGNOSIS — I5022 Chronic systolic (congestive) heart failure: Secondary | ICD-10-CM | POA: Diagnosis not present

## 2019-09-05 DIAGNOSIS — I428 Other cardiomyopathies: Secondary | ICD-10-CM | POA: Diagnosis not present

## 2019-09-05 NOTE — Progress Notes (Signed)
Virtual Visit via Telephone Note   This visit type was conducted due to national recommendations for restrictions regarding the COVID-19 Pandemic (e.g. social distancing) in an effort to limit this patient's exposure and mitigate transmission in our community.  Due to his co-morbid illnesses, this patient is at least at moderate risk for complications without adequate follow up.  This format is felt to be most appropriate for this patient at this time.  The patient did not have access to video technology/had technical difficulties with video requiring transitioning to audio format only (telephone).  All issues noted in this document were discussed and addressed.  No physical exam could be performed with this format.  Please refer to the patient's chart for his  consent to telehealth for [facility].   Evaluation Performed:  Telephone follow-up  Date:  09/05/2019   ID:  Matthew Guerrero, DOB Feb 05, 1956, MRN [id number]  Patient Location:  [address] [place] [address]  Provider location:   [address] [place],  [address]  PCP:  Biagio Borg, MD  Cardiologist:  Pixie Casino, MD Electrophysiologist:  None   Chief Complaint:  No complaints  History of Present Illness:    Matthew Guerrero is a 64 y.o. male who presents via audio/video conferencing for a telehealth visit today.  Matthew Guerrero returns today for follow-up.  He is a longstanding patient with a history of nonischemic cardiomyopathy, hypertension and dyslipidemia, who is status post subcutaneous ICD.  He recently followed up with Tommye Standard, PA-C in February 2021.  He was doing well and reported stable heart failure symptoms.  Device check in January showed normal function.  She did get a metabolic profile on him and it was noted that he had acute renal dysfunction.  Creatinine had increased from 1.04-2.30.  She recommended decreasing his Lasix from 40 to 20 mg daily.  Repeat labs 3 weeks ago  did show improvement in his creatinine down to 1.26.  Overall he feels fine.  He denies any weight gain, ankle edema, orthopnea, dyspnea on exertion or other heart failure symptoms.  He has had no device shocks.  He reports compliance with his heart failure regimen.  His last echo was in 2019 which showed an LVEF of 25% that had been stable.  The patient does not have symptoms concerning for COVID-19 infection (fever, chills, cough, or new SHORTNESS OF BREATH).    Prior CV studies:   The following studies were reviewed today:  Chart reviewed Lab work  PMHx:  Past Medical History:  Diagnosis Date  . ALLERGIC RHINITIS   . Asthma   . Cervical radiculitis   . CHF (congestive heart failure) ([place])   . Chronic systolic heart failure ([place]) 02/28/2015  . GERD (gastroesophageal reflux disease)   . GOUT   . Hyperglycemia 08/28/2014  . HYPERLIPIDEMIA   . HYPERTENSION   . Lateral epicondylitis of right elbow   . Nonischemic cardiomyopathy ([place]) 09/28/2014  . Obesity (BMI 30-39.9) 06/06/2015  . S/P cardiac cath wjith normal coronary arteries  09/28/2014  . Sleep apnea     Past Surgical History:  Procedure Laterality Date  . COLONOSCOPY    . COLONOSCOPY WITH PROPOFOL N/A 06/17/2017   Procedure: COLONOSCOPY WITH PROPOFOL;  Surgeon: Milus Banister, MD;  Location: [facility];  Service: Endoscopy;  Laterality: N/A;  . EP IMPLANTABLE DEVICE N/A 03/21/2015   Procedure: SubQ ICD Implant;  Surgeon: Deboraha Sprang, MD;  Location: [facility] LAB;  Service: Cardiovascular;  Laterality: N/A;  . LEFT AND RIGHT HEART CATHETERIZATION WITH CORONARY ANGIOGRAM N/A 09/27/2014   Procedure: LEFT AND RIGHT HEART CATHETERIZATION WITH CORONARY ANGIOGRAM;  Surgeon: Belva Crome, MD;  Location: [facility] CATH LAB;  Service: Cardiovascular;  Laterality: N/A;  . SUBQ ICD  03/21/2015    FAMHx:  Family History  Problem Relation Age of Onset  . Lung cancer Mother   . Diabetes Father   . Heart disease Father   . Heart  attack Father 54  . Hypertension Sister   . Aneurysm Sister 78       brain aneurysm  . Healthy Brother   . Healthy Brother   . Healthy Brother   . Colon cancer Neg Hx   . Esophageal cancer Neg Hx   . Rectal cancer Neg Hx   . Stomach cancer Neg Hx     SOCHx:   reports that he has never smoked. He has never used smokeless tobacco. He reports that he does not drink alcohol or use drugs.  ALLERGIES:  No Known Allergies  MEDS:  Current Meds  Medication Sig  . carvedilol (COREG) 25 MG tablet TAKE 1 TABLET BY MOUTH TWO  TIMES DAILY WITH A MEAL  . cetirizine (ZYRTEC) 10 MG tablet TAKE 1 TABLET(10 MG) BY MOUTH DAILY  . colchicine 0.6 MG tablet Take 1 tablet (0.6 mg total) by mouth daily.  [name] emtricitabine-tenofovir AF (DESCOVY) 200-25 MG tablet Take 1 tablet by mouth daily.  [name] ENTRESTO 97-103 MG TAKE 1 TABLET BY MOUTH TWO  TIMES DAILY  . furosemide (LASIX) 40 MG tablet Take 0.5 tablets (20 mg total) by mouth daily.  . pantoprazole (PROTONIX) 40 MG tablet Take 1 tablet (40 mg total) by mouth daily.  . pravastatin (PRAVACHOL) 40 MG tablet Take 1 tablet (40 mg total) by mouth every evening.  [name] spironolactone (ALDACTONE) 25 MG tablet Take 0.5 tablets (12.5 mg total) by mouth daily.     ROS: Pertinent items noted in HPI and remainder of comprehensive ROS otherwise negative.  Labs/Other Tests and Data Reviewed:    Recent Labs: 04/04/2019: ALT 12; Hemoglobin 16.1; Platelets 126.0; TSH 1.60 08/14/2019: BUN 12; Creatinine, Ser 1.26; Potassium 4.0; Sodium 143   Recent Lipid Panel Lab Results  Component Value Date/Time   CHOL 157 04/04/2019 03:58 PM   CHOL 134 10/21/2016 02:38 PM   TRIG 160.0 (H) 04/04/2019 03:58 PM   HDL 41.50 04/04/2019 03:58 PM   HDL 35 (L) 10/21/2016 02:38 PM   CHOLHDL 4 04/04/2019 03:58 PM   LDLCALC 84 04/04/2019 03:58 PM   LDLCALC 68 10/21/2016 02:38 PM   LDLDIRECT 105.8 06/18/2010 08:06 AM    Wt Readings from Last 3 Encounters:  09/05/19 188 lb (85.3 kg)    08/02/19 194 lb (88 kg)  04/04/19 192 lb (87.1 kg)     Exam:    Vital Signs:  BP 124/80   Pulse 83   Wt 188 lb (85.3 kg)   BMI 26.98 kg/m    Exam not performed due to telephone visit  ASSESSMENT & PLAN:    1. Chronic systolic congestive heart failure, LVEF 25%-NYHA class I-II symptoms 2. Nonischemic cardiomyopathy-history of normal coronaries by cardiac catheterization 3. Hypertension 4. Dyslipidemia 5. On preventative antiretroviral therapy  Matthew Guerrero has done very well recently and has minimal heart failure symptoms.  After recent adjustment in his Lasix his renal function is normalized.  Blood pressures well controlled.  Weight is close to normal which is a  big improvement.  He recently started preventative antiretroviral medication due to a partner who is HIV positive.  Blood pressure is at goal.  His ICD was recently interrogated and is functioning normally without any shocks.  We will plan a repeat echo in 6 months which will be 2 years from his most previous echo and plan follow-up afterwards.  COVID-19 Education: The signs and symptoms of COVID-19 were discussed with the patient and how to seek care for testing (follow up with PCP or arrange E-visit).  The importance of social distancing was discussed today.  Patient Risk:   After full review of this patients clinical status, I feel that they are at least moderate risk at this time.  Time:   Today, I have spent 25 minutes with the patient with telehealth technology discussing nonischemic cardiomyopathy, acute renal failure, ICD, hypertension, weight loss.     Medication Adjustments/Labs and Tests Ordered: Current medicines are reviewed at length with the patient today.  Concerns regarding medicines are outlined above.   Tests Ordered: No orders of the defined types were placed in this encounter.   Medication Changes: No orders of the defined types were placed in this encounter.   Disposition:  in 6  month(s)  Pixie Casino, MD, [facility], [place] Director of the Advanced Lipid Disorders &  Cardiovascular Risk Reduction Clinic Diplomate of the American Board of Clinical Lipidology Attending Cardiologist  Direct Dial: [phone]  Fax: [phone]  Website:  [email or web address]  Pixie Casino, MD  09/05/2019 8:50 AM

## 2019-09-05 NOTE — Patient Instructions (Signed)
Medication Instructions:  Your physician recommends that you continue on your current medications as directed. Please refer to the Current Medication list given to you today.  If you need a refill on your cardiac medications before your next appointment, please call your pharmacy*   Testing/Procedures: Your physician has requested that you have an echocardiogram. Echocardiography is a painless test that uses sound waves to create images of your heart. It provides your doctor with information about the size and shape of your heart and how well your heart's chambers and valves are working. This procedure takes approximately one hour. There are no restrictions for this procedure. -- due September 2021 -- done at [address] 3rd floor   Follow-Up: At [facility], you and your health needs are our priority.  As part of our continuing mission to provide you with exceptional heart care, we have created designated Provider Care Teams.  These Care Teams include your primary Cardiologist (physician) and Advanced Practice Providers (APPs -  Physician Assistants and Nurse Practitioners) who all work together to provide you with the care you need, when you need it.  We recommend signing up for the patient portal called "MyChart".  Sign up information is provided on this After Visit Summary.  MyChart is used to connect with patients for Virtual Visits (Telemedicine).  Patients are able to view lab/test results, encounter notes, upcoming appointments, etc.  Non-urgent messages can be sent to your provider as well.   To learn more about what you can do with MyChart, go to [email or web address].    Your next appointment:   6 month(s)  The format for your next appointment:   In Person  Provider:   You may see Pixie Casino, MD or one of the following Advanced Practice Providers on your designated Care Team:    Almyra Deforest, PA-C  Fabian Sharp, PA-C or   Roby Lofts, [place]    Other  Instructions

## 2019-09-11 ENCOUNTER — Ambulatory Visit: Payer: Medicare Other

## 2019-09-12 DIAGNOSIS — G4733 Obstructive sleep apnea (adult) (pediatric): Secondary | ICD-10-CM | POA: Diagnosis not present

## 2019-09-25 ENCOUNTER — Ambulatory Visit (INDEPENDENT_AMBULATORY_CARE_PROVIDER_SITE_OTHER): Payer: Medicare Other | Admitting: *Deleted

## 2019-09-25 DIAGNOSIS — I5022 Chronic systolic (congestive) heart failure: Secondary | ICD-10-CM

## 2019-09-25 LAB — CUP PACEART REMOTE DEVICE CHECK
Battery Remaining Percentage: 49 %
Date Time Interrogation Session: 20210405101900
Implantable Lead Implant Date: 20160929
Implantable Lead Location: 753862
Implantable Lead Model: 3401
Implantable Pulse Generator Implant Date: 20160929
Pulse Gen Serial Number: 118449

## 2019-09-26 NOTE — Progress Notes (Signed)
ICD Remote  

## 2019-09-27 MED FILL — DESCOVY 200-25 MG TABS: 200-25 | 30 days supply | Qty: 30 | Fill #1

## 2019-09-29 ENCOUNTER — Telehealth: Payer: Self-pay

## 2019-09-29 NOTE — Telephone Encounter (Signed)
Starling Manns, a Nurse Practitioner with [facility], calling and she states was with the patient today to do an A1C screening. States that the A1C was 6, prediabetic levels. Please advise.  CB#: [phone]

## 2019-10-02 ENCOUNTER — Ambulatory Visit (INDEPENDENT_AMBULATORY_CARE_PROVIDER_SITE_OTHER): Payer: Medicare Other

## 2019-10-02 ENCOUNTER — Other Ambulatory Visit: Payer: Self-pay

## 2019-10-02 VITALS — BP 124/80 | HR 83 | Temp 98.1°F | Resp 16 | Ht 70.0 in | Wt 193.4 lb

## 2019-10-02 DIAGNOSIS — Z0000 Encounter for general adult medical examination without abnormal findings: Secondary | ICD-10-CM

## 2019-10-02 NOTE — Progress Notes (Addendum)
Subjective:   Matthew Guerrero is a 64 y.o. male who presents for Medicare Annual/Subsequent preventive examination.  Review of Systems:  No ROS. Medicare Wellness Visit Additional risk factors are reflected in the social history. Cardiac Risk Factors include: advanced age (>75men, >20 women);dyslipidemia;hypertension;male gender Sleep Patterns: No issues with falling sleep; feels rested on waking and sleeps 7-8 hours nightly. Home Safety/Smoke Alarms: Feels safe in home; Smoke alarms in place. Living environment: 1-story mobile home; does not live alone; good support system. Seat Belt Safety/Bike Helmet: Wears seat belt.    Objective:    Vitals: BP 124/80 (BP Location: Right Arm, Patient Position: Sitting, Cuff Size: Normal)   Pulse 83   Temp 98.1 F (36.7 C)   Resp 16   Ht 5\' 10"  (1.778 m)   Wt 193 lb 6.4 oz (87.7 kg)   SpO2 97%   BMI 27.75 kg/m   Body mass index is 27.75 kg/m.  Advanced Directives 10/02/2019 08/18/2018 07/14/2018 06/17/2017 12/14/2016 12/10/2016 11/10/2016  Does Patient Have a Medical Advance Directive? Yes No No Yes Yes No No  Type of Academic librarian - - Healthcare Power of [employer] Power of [place];Living will - -  Does patient want to make changes to medical advance directive? No - Patient declined - - - - - -  Copy of [place] in Chart? Yes - validated most recent copy scanned in chart (See row information) - - No - copy requested Yes - -  Would patient like information on creating a medical advance directive? - Yes (ED - Information included in AVS) No - Patient declined - No - Patient declined - No - Patient declined    Tobacco Social History   Tobacco Use  Smoking Status Never Smoker  Smokeless Tobacco Never Used     Counseling given: No   Clinical Intake:  Pre-visit preparation completed: Yes  Pain : No/denies pain Pain Score: 0-No pain     Nutritional Status: BMI 25 -29  Overweight Nutritional Risks: None Diabetes: No  How often do you need to have someone help you when you read instructions, pamphlets, or other written materials from your doctor or pharmacy?: 1 - Never What is the last grade level you completed in school?: GED  Interpreter Needed?: No  Information entered by :: Zayaan Kozak N. Lowell Guitar, LPN  Past Medical History:  Diagnosis Date  . ALLERGIC RHINITIS   . Asthma   . Cervical radiculitis   . CHF (congestive heart failure) ([place])   . Chronic systolic heart failure ([place]) 02/28/2015  . GERD (gastroesophageal reflux disease)   . GOUT   . Hyperglycemia 08/28/2014  . HYPERLIPIDEMIA   . HYPERTENSION   . Lateral epicondylitis of right elbow   . Nonischemic cardiomyopathy ([place]) 09/28/2014  . Obesity (BMI 30-39.9) 06/06/2015  . S/P cardiac cath wjith normal coronary arteries  09/28/2014  . Sleep apnea    Past Surgical History:  Procedure Laterality Date  . COLONOSCOPY    . COLONOSCOPY WITH PROPOFOL N/A 06/17/2017   Procedure: COLONOSCOPY WITH PROPOFOL;  Surgeon: Milus Banister, MD;  Location: [facility] ENDOSCOPY;  Service: Endoscopy;  Laterality: N/A;  . EP IMPLANTABLE DEVICE N/A 03/21/2015   Procedure: SubQ ICD Implant;  Surgeon: Deboraha Sprang, MD;  Location: [facility];  Service: Cardiovascular;  Laterality: N/A;  . LEFT AND RIGHT HEART CATHETERIZATION WITH CORONARY ANGIOGRAM N/A 09/27/2014   Procedure: LEFT AND RIGHT HEART CATHETERIZATION WITH CORONARY ANGIOGRAM;  Surgeon: Belva Crome, MD;  Location: [facility] CATH LAB;  Service: Cardiovascular;  Laterality: N/A;  . SUBQ ICD  03/21/2015   Family History  Problem Relation Age of Onset  . Lung cancer Mother   . Diabetes Father   . Heart disease Father   . Heart attack Father 8  . Hypertension Sister   . Aneurysm Sister 57       brain aneurysm  . Healthy Brother   . Healthy Brother   . Healthy Brother   . Colon cancer Neg Hx   . Esophageal cancer Neg Hx   . Rectal cancer Neg Hx   . Stomach  cancer Neg Hx    Social History   Socioeconomic History  . Marital status: Single    Spouse name: Not on file  . Number of children: Not on file  . Years of education: Not on file  . Highest education level: Not on file  Occupational History  . Occupation: retired  Tobacco Use  . Smoking status: Never Smoker  . Smokeless tobacco: Never Used  Substance and Sexual Activity  . Alcohol use: No    Alcohol/week: 0.0 standard drinks  . Drug use: No  . Sexual activity: Not Currently  Other Topics Concern  . Not on file  Social History Narrative  . Not on file   Social Determinants of Health   Financial Resource Strain:   . Difficulty of Paying Living Expenses:   Food Insecurity:   . Worried About Charity fundraiser in the Last Year:   . Arboriculturist in the Last Year:   Transportation Needs:   . Film/video editor (Medical):   [name] Lack of Transportation (Non-Medical):   Physical Activity:   . Days of Exercise per Week:   . Minutes of Exercise per Session:   Stress:   . Feeling of Stress :   Social Connections:   . Frequency of Communication with Friends and Family:   . Frequency of Social Gatherings with Friends and Family:   . Attends Religious Services:   . Active Member of Clubs or Organizations:   . Attends Archivist Meetings:   [name] Marital Status:     Outpatient Encounter Medications as of 10/02/2019  Medication Sig  . carvedilol (COREG) 25 MG tablet TAKE 1 TABLET BY MOUTH TWO  TIMES DAILY WITH A MEAL  . cetirizine (ZYRTEC) 10 MG tablet TAKE 1 TABLET(10 MG) BY MOUTH DAILY  . colchicine 0.6 MG tablet Take 1 tablet (0.6 mg total) by mouth daily.  [name] emtricitabine-tenofovir AF (DESCOVY) 200-25 MG tablet Take 1 tablet by mouth daily.  [name] ENTRESTO 97-103 MG TAKE 1 TABLET BY MOUTH TWO  TIMES DAILY  . furosemide (LASIX) 40 MG tablet Take 0.5 tablets (20 mg total) by mouth daily.  . pantoprazole (PROTONIX) 40 MG tablet Take 1 tablet (40 mg total) by mouth  daily.  . pravastatin (PRAVACHOL) 40 MG tablet Take 1 tablet (40 mg total) by mouth every evening.  [name] spironolactone (ALDACTONE) 25 MG tablet Take 0.5 tablets (12.5 mg total) by mouth daily.   No facility-administered encounter medications on file as of 10/02/2019.    Activities of Daily Living In your present state of health, do you have any difficulty performing the following activities: 10/02/2019  Hearing? N  Vision? N  Difficulty concentrating or making decisions? N  Walking or climbing stairs? N  Dressing or bathing? N  Doing errands, shopping? N  Conservation officer, nature and  eating ? N  Using the Toilet? N  In the past six months, have you accidently leaked urine? N  Do you have problems with loss of bowel control? N  Managing your Medications? N  Managing your Finances? N  Housekeeping or managing your Housekeeping? N  Some recent data might be hidden    Patient Care Team: Biagio Borg, MD as PCP - General (Internal Medicine) Debara Pickett Nadean Corwin, MD as PCP - Cardiology (Cardiology)   Assessment:   This is a routine wellness examination for [place].  Exercise Activities and Dietary recommendations Current Exercise Habits: Home exercise routine, Type of exercise: walking, Time (Minutes): 30, Frequency (Times/Week): 5, Weekly Exercise (Minutes/Week): 150, Intensity: Moderate, Exercise limited by: None identified  Goals    . Client understands the importance of follow-up with providers by attending scheduled visits    . Patient Stated     Maintain current health status.    . Patient Stated     To continue to stay alive, be active and stay covid free.       Fall Risk Fall Risk  10/02/2019 08/18/2018 03/29/2018 12/14/2016 10/21/2016  Falls in the past year? 0 0 No No No  Number falls in past yr: 0 0 - - -  Injury with Fall? 0 - - - -  Risk for fall due to : No Fall Risks - - - -  Follow up Falls evaluation completed;Education provided;Falls prevention discussed - - - -   Is the  patient's home free of loose throw rugs in walkways, pet beds, electrical cords, etc?   yes      Grab bars in the bathroom? yes      Handrails on the stairs?   yes      Adequate lighting?   yes  Depression Screen PHQ 2/9 Scores 10/02/2019 08/18/2018 03/29/2018 12/14/2016  PHQ - 2 Score 0 0 0 0    Cognitive Function     6CIT Screen 10/02/2019  What Year? 0 points  What month? 0 points  What time? 0 points  Count back from 20 0 points  Months in reverse 0 points  Repeat phrase 0 points  Total Score 0    Immunization History  Administered Date(s) Administered  . Influenza Split 04/28/2012  . Influenza Whole 03/22/2009, 04/18/2010  . Influenza,inj,Quad PF,6+ Mos 02/25/2014, 02/28/2015, 03/29/2018  . Influenza-Unspecified 02/10/2017, 03/29/2018, 03/09/2019  . Pneumococcal Polysaccharide-23 02/28/2015  . Td 07/01/2009  . Zoster Recombinat (Shingrix) 03/22/2016, 05/22/2016    Screening Tests Health Maintenance  Topic Date Due  . TETANUS/TDAP  07/02/2019  . PNA vac Low Risk Adult (1 of 2 - PCV13) 09/01/2019  . INFLUENZA VACCINE  01/21/2020  . COLONOSCOPY  06/17/2022  . Hepatitis C Screening  Completed  . HIV Screening  Completed   Cancer Screenings: Lung: Low Dose CT Chest recommended if Age 40-80 years, 30 pack-year currently smoking OR have quit w/in 15years. Patient does not qualify. Colorectal: last done on 06/17/2017     Plan:     Reviewed health maintenance screenings with patient today and relevant education, vaccines, and/or referrals were provided.    Continue doing brain stimulating activities (puzzles, reading, adult coloring books, staying active) to keep memory sharp.    Continue to eat heart healthy diet (full of fruits, vegetables, whole grains, lean protein, water--limit salt, fat, and sugar intake) and increase physical activity as tolerated.  I have personally reviewed and noted the following in the patient's chart:   . Medical  and social history . Use  of alcohol, tobacco or illicit drugs  . Current medications and supplements . Functional ability and status . Nutritional status . Physical activity . Advanced directives . List of other physicians . Hospitalizations, surgeries, and ER visits in previous 12 months . Vitals . Screenings to include cognitive, depression, and falls . Referrals and appointments  In addition, I have reviewed and discussed with patient certain preventive protocols, quality metrics, and best practice recommendations. A written personalized care plan for preventive services as well as general preventive health recommendations were provided to patient.     Sheral Flow, LPN  [id number] Nurse Health Advisor  Medical screening examination/treatment/procedure(s) were performed by non-physician practitioner and as supervising physician I was immediately available for consultation/collaboration. I agree with above. Scarlette Calico, MD

## 2019-10-02 NOTE — Patient Instructions (Signed)
Matthew Guerrero , Thank you for taking time to come for your Medicare Wellness Visit. I appreciate your ongoing commitment to your health goals. Please review the following plan we discussed and let me know if I can assist you in the future.   Screening recommendations/referrals: Colorectal Screening: 06/17/2017  Vision and Dental Exams: Recommended annual ophthalmology exams for early detection of glaucoma and other disorders of the eye Recommended annual dental exams for proper oral hygiene  Vaccinations: Influenza vaccine: 03/09/2019 Pneumococcal vaccine: 02/28/2015; need Prevnar 13 Tdap vaccine: 07/01/2009; Due every 10 years. Please call your insurance company to determine your out of pocket expense. You may also receive this vaccine at your local pharmacy or Health Dept. Shingles vaccine: 03/22/2016 and 05/22/2016 Covid vaccine: 09/01/2019, next injection due on 10/09/2019  Advanced directives: Advance directives discussed with you today. Received copy for chart at today's visit.  Goals:  Recommend to drink at least 6-8 8oz glasses of water per day.  Recommend to exercise for at least 150 minutes per week.  Recommend to remove any items from the home that may cause slips or trips.  Recommend to decrease portion sizes by eating 3 small healthy meals and at least 2 healthy snacks per day.  Recommend to begin DASH diet as directed below.  Next appointment: Please schedule your Annual Wellness Visit with your Nurse Health Advisor in one year.  Preventive Care 29 Years and Older, Male Preventive care refers to lifestyle choices and visits with your health care provider that can promote health and wellness. What does preventive care include?  A yearly physical exam. This is also called an annual well check.  Dental exams once or twice a year.  Routine eye exams. Ask your health care provider how often you should have your eyes checked.  Personal lifestyle choices, including:  Daily  care of your teeth and gums.  Regular physical activity.  Eating a healthy diet.  Avoiding tobacco and drug use.  Limiting alcohol use.  Practicing safe sex.  Taking low doses of aspirin every day if recommended by your health care provider..  Taking vitamin and mineral supplements as recommended by your health care provider. What happens during an annual well check? The services and screenings done by your health care provider during your annual well check will depend on your age, overall health, lifestyle risk factors, and family history of disease. Counseling  Your health care provider may ask you questions about your:  Alcohol use.  Tobacco use.  Drug use.  Emotional well-being.  Home and relationship well-being.  Sexual activity.  Eating habits.  History of falls.  Memory and ability to understand (cognition).  Work and work Statistician. Screening  You may have the following tests or measurements:  Height, weight, and BMI.  Blood pressure.  Lipid and cholesterol levels. These may be checked every 5 years, or more frequently if you are over 8 years old.  Skin check.  Lung cancer screening. You may have this screening every year starting at age 74 if you have a 30-pack-year history of smoking and currently smoke or have quit within the past 15 years.  Fecal occult blood test (FOBT) of the stool. You may have this test every year starting at age 61.  Flexible sigmoidoscopy or colonoscopy. You may have a sigmoidoscopy every 5 years or a colonoscopy every 10 years starting at age 80.  Prostate cancer screening. Recommendations will vary depending on your family history and other risks.  Hepatitis C blood test.  Hepatitis B blood test.  Sexually transmitted disease (STD) testing.  Diabetes screening. This is done by checking your blood sugar (glucose) after you have not eaten for a while (fasting). You may have this done every 1-3 years.  Abdominal  aortic aneurysm (AAA) screening. You may need this if you are a current or former smoker.  Osteoporosis. You may be screened starting at age 33 if you are at high risk. Talk with your health care provider about your test results, treatment options, and if necessary, the need for more tests. Vaccines  Your health care provider may recommend certain vaccines, such as:  Influenza vaccine. This is recommended every year.  Tetanus, diphtheria, and acellular pertussis (Tdap, Td) vaccine. You may need a Td booster every 10 years.  Zoster vaccine. You may need this after age 64.  Pneumococcal 13-valent conjugate (PCV13) vaccine. One dose is recommended after age 12.  Pneumococcal polysaccharide (PPSV23) vaccine. One dose is recommended after age 32. Talk to your health care provider about which screenings and vaccines you need and how often you need them. This information is not intended to replace advice given to you by your health care provider. Make sure you discuss any questions you have with your health care provider. Document Released: 07/05/2015 Document Revised: 02/26/2016 Document Reviewed: 04/09/2015 Elsevier Interactive Patient Education  2017 [place] Prevention in the Home Falls can cause injuries. They can happen to people of all ages. There are many things you can do to make your home safe and to help prevent falls. What can I do on the outside of my home?  Regularly fix the edges of walkways and driveways and fix any cracks.  Remove anything that might make you trip as you walk through a door, such as a raised step or threshold.  Trim any bushes or trees on the path to your home.  Use bright outdoor lighting.  Clear any walking paths of anything that might make someone trip, such as rocks or tools.  Regularly check to see if handrails are loose or broken. Make sure that both sides of any steps have handrails.  Any raised decks and porches should have  guardrails on the edges.  Have any leaves, snow, or ice cleared regularly.  Use sand or salt on walking paths during winter.  Clean up any spills in your garage right away. This includes oil or grease spills. What can I do in the bathroom?  Use night lights.  Install grab bars by the toilet and in the tub and shower. Do not use towel bars as grab bars.  Use non-skid mats or decals in the tub or shower.  If you need to sit down in the shower, use a plastic, non-slip stool.  Keep the floor dry. Clean up any water that spills on the floor as soon as it happens.  Remove soap buildup in the tub or shower regularly.  Attach bath mats securely with double-sided non-slip rug tape.  Do not have throw rugs and other things on the floor that can make you trip. What can I do in the bedroom?  Use night lights.  Make sure that you have a light by your bed that is easy to reach.  Do not use any sheets or blankets that are too big for your bed. They should not hang down onto the floor.  Have a firm chair that has side arms. You can use this for support while you get dressed.  Do not  have throw rugs and other things on the floor that can make you trip. What can I do in the kitchen?  Clean up any spills right away.  Avoid walking on wet floors.  Keep items that you use a lot in easy-to-reach places.  If you need to reach something above you, use a strong step stool that has a grab bar.  Keep electrical cords out of the way.  Do not use floor polish or wax that makes floors slippery. If you must use wax, use non-skid floor wax.  Do not have throw rugs and other things on the floor that can make you trip. What can I do with my stairs?  Do not leave any items on the stairs.  Make sure that there are handrails on both sides of the stairs and use them. Fix handrails that are broken or loose. Make sure that handrails are as long as the stairways.  Check any carpeting to make sure that  it is firmly attached to the stairs. Fix any carpet that is loose or worn.  Avoid having throw rugs at the top or bottom of the stairs. If you do have throw rugs, attach them to the floor with carpet tape.  Make sure that you have a light switch at the top of the stairs and the bottom of the stairs. If you do not have them, ask someone to add them for you. What else can I do to help prevent falls?  Wear shoes that:  Do not have high heels.  Have rubber bottoms.  Are comfortable and fit you well.  Are closed at the toe. Do not wear sandals.  If you use a stepladder:  Make sure that it is fully opened. Do not climb a closed stepladder.  Make sure that both sides of the stepladder are locked into place.  Ask someone to hold it for you, if possible.  Clearly mark and make sure that you can see:  Any grab bars or handrails.  First and last steps.  Where the edge of each step is.  Use tools that help you move around (mobility aids) if they are needed. These include:  Canes.  Walkers.  Scooters.  Crutches.  Turn on the lights when you go into a dark area. Replace any light bulbs as soon as they burn out.  Set up your furniture so you have a clear path. Avoid moving your furniture around.  If any of your floors are uneven, fix them.  If there are any pets around you, be aware of where they are.  Review your medicines with your doctor. Some medicines can make you feel dizzy. This can increase your chance of falling. Ask your doctor what other things that you can do to help prevent falls. This information is not intended to replace advice given to you by your health care provider. Make sure you discuss any questions you have with your health care provider. Document Released: 04/04/2009 Document Revised: 11/14/2015 Document Reviewed: 07/13/2014 Elsevier Interactive Patient Education  2017 [employer].

## 2019-10-16 ENCOUNTER — Telehealth: Payer: Self-pay

## 2019-10-16 ENCOUNTER — Telehealth: Payer: Self-pay | Admitting: Internal Medicine

## 2019-10-16 NOTE — Telephone Encounter (Signed)
Unscheduled, nonbillable. No episodes or alerts. Normal function. Routing to triage to notify patient no need for manual sends. LC  The pt states the monitor blinks every monday for him to send a transmission. I told him I will ask the [facility] rep to see why it blink every monday and get back with him. Takeria Marquina, cma

## 2019-10-16 NOTE — Telephone Encounter (Signed)
     I went in pt's chart to see who called him today(10-16-19)

## 2019-10-17 DIAGNOSIS — Z03818 Encounter for observation for suspected exposure to other biological agents ruled out: Secondary | ICD-10-CM | POA: Diagnosis not present

## 2019-10-20 MED FILL — DESCOVY 200-25 MG TABS: 200-25 | 30 days supply | Qty: 30 | Fill #2

## 2019-10-24 DIAGNOSIS — Z03818 Encounter for observation for suspected exposure to other biological agents ruled out: Secondary | ICD-10-CM | POA: Diagnosis not present

## 2019-10-31 DIAGNOSIS — Z03818 Encounter for observation for suspected exposure to other biological agents ruled out: Secondary | ICD-10-CM | POA: Diagnosis not present

## 2019-11-07 DIAGNOSIS — Z03818 Encounter for observation for suspected exposure to other biological agents ruled out: Secondary | ICD-10-CM | POA: Diagnosis not present

## 2019-11-14 DIAGNOSIS — Z03818 Encounter for observation for suspected exposure to other biological agents ruled out: Secondary | ICD-10-CM | POA: Diagnosis not present

## 2019-11-21 DIAGNOSIS — Z03818 Encounter for observation for suspected exposure to other biological agents ruled out: Secondary | ICD-10-CM | POA: Diagnosis not present

## 2019-11-23 ENCOUNTER — Other Ambulatory Visit (HOSPITAL_COMMUNITY)
Admission: RE | Admit: 2019-11-23 | Discharge: 2019-11-23 | Disposition: A | Discharge status: Home / Self Care | Payer: Medicare Other | Source: Ambulatory Visit | Attending: Infectious Disease | Admitting: Infectious Disease

## 2019-11-23 ENCOUNTER — Other Ambulatory Visit: Payer: Self-pay

## 2019-11-23 ENCOUNTER — Ambulatory Visit (INDEPENDENT_AMBULATORY_CARE_PROVIDER_SITE_OTHER): Payer: Medicare Other | Admitting: Pharmacist

## 2019-11-23 DIAGNOSIS — Z114 Encounter for screening for human immunodeficiency virus [HIV]: Secondary | ICD-10-CM | POA: Insufficient documentation

## 2019-11-23 DIAGNOSIS — Z79899 Other long term (current) drug therapy: Secondary | ICD-10-CM

## 2019-11-23 DIAGNOSIS — Z7252 High risk homosexual behavior: Secondary | ICD-10-CM | POA: Diagnosis not present

## 2019-11-23 NOTE — Progress Notes (Addendum)
I have reviewed the Infectious Disease Clinical Pharmacist's note above. I agree with the assessment and plan as outlined in Lachina Salsberry's note.  Date:  11/23/2019   HPI: Matthew Guerrero is a 64 y.o. male who presents to the [facility] for 3 month PrEP follow-up.  Insured   [x]    Uninsured  []    Patient Active Problem List   Diagnosis Date Noted  . Benign neoplasm of descending colon   . Benign neoplasm of ascending colon   . ICD (implantable cardioverter-defibrillator) in place 02/08/2017  . Contusion of right great toe without damage to nail 12/14/2016  . Erectile dysfunction 07/17/2016  . Degenerative arthritis of left knee 03/03/2016  . GERD (gastroesophageal reflux disease) 03/03/2016  . Preventative health care 08/29/2015  . Obesity (BMI 30-39.9) 06/06/2015  . Polycythemia, secondary 05/21/2015  . NICM (nonischemic cardiomyopathy) ([place]) 03/21/2015  . Chronic systolic heart failure ([place]) 02/28/2015  . Excessive daytime sleepiness 12/16/2014  . OSA (obstructive sleep apnea) 12/16/2014  . Nonischemic cardiomyopathy ([place]) 09/28/2014  . S/P cardiac cath wjith normal coronary arteries  09/28/2014  . Erythrocytosis and decreased platelet count. 09/28/2014  . Musculoskeletal chest pain 09/25/2014  . Elevated troponin 09/25/2014  . Prediabetes 08/28/2014  . Cervical radiculitis 01/02/2012  . Hyperlipidemia 07/03/2010  . GOUT 07/01/2009  . Essential hypertension 07/01/2009  . Allergic rhinitis 07/01/2009  . History of adenomatous polyp of colon 07/01/2009    Patient's Medications  New Prescriptions   No medications on file  Previous Medications   CARVEDILOL (COREG) 25 MG TABLET    TAKE 1 TABLET BY MOUTH TWO  TIMES DAILY WITH A MEAL   CETIRIZINE (ZYRTEC) 10 MG TABLET    TAKE 1 TABLET(10 MG) BY MOUTH DAILY   COLCHICINE 0.6 MG TABLET    Take 1 tablet (0.6 mg total) by mouth daily.   EMTRICITABINE-TENOFOVIR AF (DESCOVY) 200-25 MG TABLET    Take 1 tablet by  mouth daily.   ENTRESTO 97-103 MG    TAKE 1 TABLET BY MOUTH TWO  TIMES DAILY   FUROSEMIDE (LASIX) 40 MG TABLET    Take 0.5 tablets (20 mg total) by mouth daily.   PANTOPRAZOLE (PROTONIX) 40 MG TABLET    Take 1 tablet (40 mg total) by mouth daily.   PRAVASTATIN (PRAVACHOL) 40 MG TABLET    Take 1 tablet (40 mg total) by mouth every evening.   SPIRONOLACTONE (ALDACTONE) 25 MG TABLET    Take 0.5 tablets (12.5 mg total) by mouth daily.  Modified Medications   No medications on file  Discontinued Medications   No medications on file    Allergies: No Known Allergies  Past Medical History: Past Medical History:  Diagnosis Date  . ALLERGIC RHINITIS   . Asthma   . Cervical radiculitis   . CHF (congestive heart failure) ([place])   . Chronic systolic heart failure ([place]) 02/28/2015  . GERD (gastroesophageal reflux disease)   . GOUT   . Hyperglycemia 08/28/2014  . HYPERLIPIDEMIA   . HYPERTENSION   . Lateral epicondylitis of right elbow   . Nonischemic cardiomyopathy ([place]) 09/28/2014  . Obesity (BMI 30-39.9) 06/06/2015  . S/P cardiac cath wjith normal coronary arteries  09/28/2014  . Sleep apnea     Social History: Social History   Socioeconomic History  . Marital status: Single    Spouse name: Not on file  . Number of children: Not on file  . Years of education: Not on file  . Highest education level:  Not on file  Occupational History  . Occupation: retired  Tobacco Use  . Smoking status: Never Smoker  . Smokeless tobacco: Never Used  Substance and Sexual Activity  . Alcohol use: No    Alcohol/week: 0.0 standard drinks  . Drug use: No  . Sexual activity: Not Currently  Other Topics Concern  . Not on file  Social History Narrative  . Not on file   Social Determinants of Health   Financial Resource Strain:   . Difficulty of Paying Living Expenses:   Food Insecurity:   . Worried About Charity fundraiser in the Last Year:   . Arboriculturist in the Last Year:   Transportation  Needs:   . Film/video editor (Medical):   [name] Lack of Transportation (Non-Medical):   Physical Activity:   . Days of Exercise per Week:   . Minutes of Exercise per Session:   Stress:   . Feeling of Stress :   Social Connections:   . Frequency of Communication with Friends and Family:   . Frequency of Social Gatherings with Friends and Family:   . Attends Religious Services:   . Active Member of Clubs or Organizations:   . Attends Archivist Meetings:   [name] Marital Status:     No flowsheet data found.  Labs:  SCr: Lab Results  Component Value Date   CREATININE 1.26 08/14/2019   CREATININE 2.30 (H) 08/02/2019   CREATININE 1.04 04/04/2019   CREATININE 1.10 03/31/2019   CREATININE 1.19 03/08/2018   HIV Lab Results  Component Value Date   HIV NON-REACTIVE 08/29/2019   HIV NON-REACTIVE 05/31/2019   HIV NON-REACTIVE 03/29/2017   Hepatitis B Lab Results  Component Value Date   HEPBSAB REACTIVE (A) 05/31/2019   HEPBSAG NON-REACTIVE 05/31/2019   Hepatitis C Lab Results  Component Value Date   HEPCAB NON-REACTIVE 05/31/2019   Hepatitis A Lab Results  Component Value Date   HAV NON-REACTIVE 05/31/2019   RPR and STI Lab Results  Component Value Date   LABRPR NON-REACTIVE 05/31/2019    STI Results GC CT  05/31/2019 Negative Negative    Assessment: Matthew Guerrero is here today for his 3 month PrEP follow up appointment. He is here with his partner who is HIV positive on medication and sees [place]. He has no concerns for anything today and has no other partners. Will check labs and see him back in 3 months.  Of note, he would like a 90 day supply sent in to the pharmacy.  Plan: - HIV antibody, RPR, urine cytology today - Descovy x 3 months if HIV negative - F/u in 3 months  Matthew Guerrero, PharmD, BCIDP, AAHIVP, CPP Clinical Pharmacist Practitioner Infectious Diseases [facility] 11/23/2019, 3:05  PM

## 2019-11-24 ENCOUNTER — Encounter: Payer: Self-pay | Admitting: Pharmacist

## 2019-11-24 ENCOUNTER — Other Ambulatory Visit: Payer: Self-pay | Admitting: Pharmacist

## 2019-11-24 DIAGNOSIS — Z7252 High risk homosexual behavior: Secondary | ICD-10-CM

## 2019-11-24 LAB — HIV ANTIBODY (ROUTINE TESTING W REFLEX): HIV 1&2 Ab, 4th Generation: NONREACTIVE

## 2019-11-24 LAB — URINE CYTOLOGY ANCILLARY ONLY
Chlamydia: NEGATIVE
Comment: NEGATIVE
Comment: NORMAL
Neisseria Gonorrhea: NEGATIVE

## 2019-11-24 LAB — RPR: RPR Ser Ql: NONREACTIVE

## 2019-11-24 MED ORDER — DESCOVY 200-25 MG PO TABS: 1.0000 | ORAL_TABLET | Freq: Every day | ORAL | 0 refills | Status: DC

## 2019-11-24 MED FILL — DESCOVY 200-25 MG TABS: 200-25 | 30 days supply | Qty: 30 | Fill #0

## 2019-11-24 NOTE — Progress Notes (Signed)
Patient's HIV antibody is negative.  Will send in 3 more months of Descovy to [facility].  

## 2019-11-28 DIAGNOSIS — Z03818 Encounter for observation for suspected exposure to other biological agents ruled out: Secondary | ICD-10-CM | POA: Diagnosis not present

## 2019-12-01 ENCOUNTER — Telehealth: Payer: Self-pay | Admitting: *Deleted

## 2019-12-01 ENCOUNTER — Other Ambulatory Visit: Payer: Self-pay | Admitting: Physician Assistant

## 2019-12-01 NOTE — Telephone Encounter (Signed)
  Patient Consent for Virtual Visit         Matthew Guerrero has provided verbal consent on 12/01/2019 for a virtual visit (video or telephone).   CONSENT FOR VIRTUAL VISIT FOR:  Matthew Guerrero  By participating in this virtual visit I agree to the following:  I hereby voluntarily request, consent and authorize [facility] and its employed or contracted physicians, physician assistants, nurse practitioners or other licensed health care professionals (the Practitioner), to provide me with telemedicine health care services (the "Services") as deemed necessary by the treating Practitioner. I acknowledge and consent to receive the Services by the Practitioner via telemedicine. I understand that the telemedicine visit will involve communicating with the Practitioner through live audiovisual communication technology and the disclosure of certain medical information by electronic transmission. I acknowledge that I have been given the opportunity to request an in-person assessment or other available alternative prior to the telemedicine visit and am voluntarily participating in the telemedicine visit.  I understand that I have the right to withhold or withdraw my consent to the use of telemedicine in the course of my care at any time, without affecting my right to future care or treatment, and that the Practitioner or I may terminate the telemedicine visit at any time. I understand that I have the right to inspect all information obtained and/or recorded in the course of the telemedicine visit and may receive copies of available information for a reasonable fee.  I understand that some of the potential risks of receiving the Services via telemedicine include:  [name] Delay or interruption in medical evaluation due to technological equipment failure or disruption; . Information transmitted may not be sufficient (e.g. poor resolution of images) to allow for appropriate medical decision making by the  Practitioner; and/or  . In rare instances, security protocols could fail, causing a breach of personal health information.  Furthermore, I acknowledge that it is my responsibility to provide information about my medical history, conditions and care that is complete and accurate to the best of my ability. I acknowledge that Practitioner's advice, recommendations, and/or decision may be based on factors not within their control, such as incomplete or inaccurate data provided by me or distortions of diagnostic images or specimens that may result from electronic transmissions. I understand that the practice of medicine is not an exact science and that Practitioner makes no warranties or guarantees regarding treatment outcomes. I acknowledge that a copy of this consent can be made available to me via my patient portal ([facility]), or I can request a printed copy by calling the office of [facility].    I understand that my insurance will be billed for this visit.   I have read or had this consent read to me. . I understand the contents of this consent, which adequately explains the benefits and risks of the Services being provided via telemedicine.  . I have been provided ample opportunity to ask questions regarding this consent and the Services and have had my questions answered to my satisfaction. . I give my informed consent for the services to be provided through the use of telemedicine in my medical care

## 2019-12-05 DIAGNOSIS — Z03818 Encounter for observation for suspected exposure to other biological agents ruled out: Secondary | ICD-10-CM | POA: Diagnosis not present

## 2019-12-11 ENCOUNTER — Other Ambulatory Visit: Payer: Self-pay

## 2019-12-11 ENCOUNTER — Telehealth (INDEPENDENT_AMBULATORY_CARE_PROVIDER_SITE_OTHER): Payer: Medicare Other | Admitting: Cardiology

## 2019-12-11 ENCOUNTER — Encounter: Payer: Self-pay | Admitting: Cardiology

## 2019-12-11 VITALS — BP 136/78 | HR 64 | Ht 70.0 in | Wt 185.4 lb

## 2019-12-11 DIAGNOSIS — G4733 Obstructive sleep apnea (adult) (pediatric): Secondary | ICD-10-CM | POA: Diagnosis not present

## 2019-12-11 DIAGNOSIS — I10 Essential (primary) hypertension: Secondary | ICD-10-CM

## 2019-12-11 NOTE — Progress Notes (Signed)
Virtual Visit via Telephone Note   This visit type was conducted due to national recommendations for restrictions regarding the COVID-19 Pandemic (e.g. social distancing) in an effort to limit this patient's exposure and mitigate transmission in our community.  Due to his co-morbid illnesses, this patient is at least at moderate risk for complications without adequate follow up.  This format is felt to be most appropriate for this patient at this time.  All issues noted in this document were discussed and addressed.  A limited physical exam was performed with this format.  Please refer to the patient's chart for his consent to telehealth for [facility]   Evaluation Performed:  Follow-up visit  This visit type was conducted due to national recommendations for restrictions regarding the COVID-19 Pandemic (e.g. social distancing).  This format is felt to be most appropriate for this patient at this time.  All issues noted in this document were discussed and addressed.  No physical exam was performed (except for noted visual exam findings with Video Visits).  Please refer to the patient's chart (MyChart message for video visits and phone note for telephone visits) for the patient's consent to telehealth for [facility].  Date:  12/11/2019   ID:  Matthew Guerrero, DOB May 31, 1956, MRN [id number]  Patient Location:  Home  Provider location:   [place]  PCP:  Biagio Borg, MD  Cardiologist:  Pixie Casino, MD  Sleep Medicine:  Fransico Him, MD Electrophysiologist:  None   Chief Complaint:  OSA  History of Present Illness:    Matthew Guerrero is a 64 y.o. male who presents via audio/video conferencing for a telehealth visit today.    [name] is a 64y.o. male with a hx of moderate OSA with an AHI of 23/hr and mildto moderate snoring and associated nocturnal hypoxemia with his respiratory events as low as71%. He was titrated to 9 cm H2O.    He is doing well with his  CPAP device and thinks that he has gotten used to it.  He tolerates the mask and feels the pressure is adequate.  Since going on CPAP he feels rested in the am and has no significant daytime sleepiness.  He occasionally has some mild mouth dryness but he is not compliant with using his chin strap.  He does not think that he snores.    The patient does not have symptoms concerning for COVID-19 infection (fever, chills, cough, or new shortness of breath).    Prior CV studies:   The following studies were reviewed today:  PAP compliance data  Past Medical History:  Diagnosis Date  . ALLERGIC RHINITIS   . Asthma   . Cervical radiculitis   . CHF (congestive heart failure) ([place])   . Chronic systolic heart failure ([place]) 02/28/2015  . GERD (gastroesophageal reflux disease)   . GOUT   . Hyperglycemia 08/28/2014  . HYPERLIPIDEMIA   . HYPERTENSION   . Lateral epicondylitis of right elbow   . Nonischemic cardiomyopathy ([place]) 09/28/2014  . Obesity (BMI 30-39.9) 06/06/2015  . S/P cardiac cath wjith normal coronary arteries  09/28/2014  . Sleep apnea    Past Surgical History:  Procedure Laterality Date  . COLONOSCOPY    . COLONOSCOPY WITH PROPOFOL N/A 06/17/2017   Procedure: COLONOSCOPY WITH PROPOFOL;  Surgeon: Milus Banister, MD;  Location: [facility] ENDOSCOPY;  Service: Endoscopy;  Laterality: N/A;  . EP IMPLANTABLE DEVICE N/A 03/21/2015   Procedure: SubQ ICD Implant;  Surgeon: Deboraha Sprang, MD;  Location: [facility] CV [facility];  Service: Cardiovascular;  Laterality: N/A;  . LEFT AND RIGHT HEART CATHETERIZATION WITH CORONARY ANGIOGRAM N/A 09/27/2014   Procedure: LEFT AND RIGHT HEART CATHETERIZATION WITH CORONARY ANGIOGRAM;  Surgeon: Belva Crome, MD;  Location: [facility] C[facility] LAB;  Service: Cardiovascular;  Laterality: N/A;  . SUBQ ICD  03/21/2015     Current Meds  Medication Sig  . carvedilol (COREG) 25 MG tablet TAKE 1 TABLET BY MOUTH TWO  TIMES DAILY WITH A MEAL  . cetirizine (ZYRTEC) 10 MG tablet TAKE 1  TABLET(10 MG) BY MOUTH DAILY  . colchicine 0.6 MG tablet Take 1 tablet (0.6 mg total) by mouth daily.  [name] ENTRESTO 97-103 MG TAKE 1 TABLET BY MOUTH TWO  TIMES DAILY  . furosemide (LASIX) 40 MG tablet Take 0.5 tablets (20 mg total) by mouth daily.  . pantoprazole (PROTONIX) 40 MG tablet Take 1 tablet (40 mg total) by mouth daily.  . pravastatin (PRAVACHOL) 40 MG tablet Take 1 tablet (40 mg total) by mouth every evening.  [name] spironolactone (ALDACTONE) 25 MG tablet Take 0.5 tablets (12.5 mg total) by mouth daily.     Allergies:   Patient has no known allergies.   Social History   Tobacco Use  . Smoking status: Never Smoker  . Smokeless tobacco: Never Used  Vaping Use  . Vaping Use: Never used  Substance Use Topics  . Alcohol use: No    Alcohol/week: 0.0 standard drinks  . Drug use: No     Family Hx: The patient's family history includes Aneurysm (age of onset: 76) in his sister; Diabetes in his father; Healthy in his brother, brother, and brother; Heart attack (age of onset: 27) in his father; Heart disease in his father; Hypertension in his sister; Lung cancer in his mother. There is no history of Colon cancer, Esophageal cancer, Rectal cancer, or Stomach cancer.  ROS:   Please see the history of present illness.     All other systems reviewed and are negative.   Labs/Other Tests and Data Reviewed:    Recent Labs: 04/04/2019: ALT 12; Hemoglobin 16.1; Platelets 126.0; TSH 1.60 08/14/2019: BUN 12; Creatinine, Ser 1.26; Potassium 4.0; Sodium 143   Recent Lipid Panel Lab Results  Component Value Date/Time   CHOL 157 04/04/2019 03:58 PM   CHOL 134 10/21/2016 02:38 PM   TRIG 160.0 (H) 04/04/2019 03:58 PM   HDL 41.50 04/04/2019 03:58 PM   HDL 35 (L) 10/21/2016 02:38 PM   CHOLHDL 4 04/04/2019 03:58 PM   LDLCALC 84 04/04/2019 03:58 PM   LDLCALC 68 10/21/2016 02:38 PM   LDLDIRECT 105.8 06/18/2010 08:06 AM    Wt Readings from Last 3 Encounters:  12/11/19 185 lb 6.4 oz (84.1 kg)    10/02/19 193 lb 6.4 oz (87.7 kg)  09/05/19 188 lb (85.3 kg)     Objective:   1.2 Vital Signs:  BP 136/78   Pulse 64   Ht 5\' 10"  (1.778 m)   Wt 185 lb 6.4 oz (84.1 kg)   BMI 26.60 kg/m   ASSESSMENT & PLAN:    1.  OSA - The patient is tolerating PAP therapy well without any problems. The PAP download was reviewed today and showed an AHI of 1.2/hr on auto PAP/hr  with 27% compliance in using more than 4 hours nightly.  The patient has been using and benefiting from PAP use and will continue to benefit from therapy.  -I encouraged him to be more compliant with his device  2.  Hypertension -BP controlled -continue Carvedilol 25mg  BID, Entresto 97-103mg  BID and spiro 12.5mg  daily   COVID-19 Education: The signs and symptoms of COVID-19 were discussed with the patient and how to seek care for testing (follow up with PCP or arrange E-visit).  The importance of social distancing was discussed today.  Patient Risk:   After full review of this patient's clinical status, I feel that they are at least moderate risk at this time.  Time:   Today, I have spent 15 minutes on telehealth medicine discussing medical problems including OSA, HTN, obesity.  We also reviewed the symptoms of COVID 19 and the ways to protect against contracting the virus with telehealth technology.  I spent an additional 5 minutes reviewing patient's chart including PAP compliance download.  Medication Adjustments/Labs and Tests Ordered: Current medicines are reviewed at length with the patient today.  Concerns regarding medicines are outlined above.  Tests Ordered: No orders of the defined types were placed in this encounter.  Medication Changes: No orders of the defined types were placed in this encounter.   Disposition:  Follow up in 1 year(s)  Signed, Fransico Him, MD  12/11/2019 10:44 AM    [place]

## 2019-12-11 NOTE — Patient Instructions (Signed)

## 2019-12-21 MED FILL — DESCOVY 200-25 MG TABS: 200-25 | 30 days supply | Qty: 30 | Fill #1

## 2019-12-26 ENCOUNTER — Ambulatory Visit (INDEPENDENT_AMBULATORY_CARE_PROVIDER_SITE_OTHER): Payer: Medicare Other | Admitting: *Deleted

## 2019-12-26 DIAGNOSIS — I428 Other cardiomyopathies: Secondary | ICD-10-CM | POA: Diagnosis not present

## 2019-12-26 LAB — CUP PACEART REMOTE DEVICE CHECK
Battery Remaining Percentage: 46 %
Date Time Interrogation Session: 20210705103400
Implantable Lead Implant Date: 20160929
Implantable Lead Location: 753862
Implantable Lead Model: 3401
Implantable Pulse Generator Implant Date: 20160929
Pulse Gen Serial Number: 118449

## 2019-12-27 NOTE — Progress Notes (Signed)
Remote ICD transmission.   

## 2020-01-02 DIAGNOSIS — Z03818 Encounter for observation for suspected exposure to other biological agents ruled out: Secondary | ICD-10-CM | POA: Diagnosis not present

## 2020-01-02 DIAGNOSIS — Z20822 Contact with and (suspected) exposure to covid-19: Secondary | ICD-10-CM | POA: Diagnosis not present

## 2020-01-09 DIAGNOSIS — Z03818 Encounter for observation for suspected exposure to other biological agents ruled out: Secondary | ICD-10-CM | POA: Diagnosis not present

## 2020-01-09 DIAGNOSIS — Z20822 Contact with and (suspected) exposure to covid-19: Secondary | ICD-10-CM | POA: Diagnosis not present

## 2020-01-10 DIAGNOSIS — G4733 Obstructive sleep apnea (adult) (pediatric): Secondary | ICD-10-CM | POA: Diagnosis not present

## 2020-01-18 MED FILL — DESCOVY 200-25 MG TABS: 200-25 | 30 days supply | Qty: 30 | Fill #2

## 2020-01-23 DIAGNOSIS — Z03818 Encounter for observation for suspected exposure to other biological agents ruled out: Secondary | ICD-10-CM | POA: Diagnosis not present

## 2020-01-23 DIAGNOSIS — Z20822 Contact with and (suspected) exposure to covid-19: Secondary | ICD-10-CM | POA: Diagnosis not present

## 2020-01-27 ENCOUNTER — Other Ambulatory Visit: Payer: Self-pay | Admitting: Internal Medicine

## 2020-01-27 DIAGNOSIS — I428 Other cardiomyopathies: Secondary | ICD-10-CM

## 2020-01-30 DIAGNOSIS — Z03818 Encounter for observation for suspected exposure to other biological agents ruled out: Secondary | ICD-10-CM | POA: Diagnosis not present

## 2020-01-30 DIAGNOSIS — Z20822 Contact with and (suspected) exposure to covid-19: Secondary | ICD-10-CM | POA: Diagnosis not present

## 2020-02-06 DIAGNOSIS — Z20822 Contact with and (suspected) exposure to covid-19: Secondary | ICD-10-CM | POA: Diagnosis not present

## 2020-02-06 DIAGNOSIS — Z03818 Encounter for observation for suspected exposure to other biological agents ruled out: Secondary | ICD-10-CM | POA: Diagnosis not present

## 2020-02-13 DIAGNOSIS — Z03818 Encounter for observation for suspected exposure to other biological agents ruled out: Secondary | ICD-10-CM | POA: Diagnosis not present

## 2020-02-18 NOTE — Progress Notes (Signed)
Patient Care Team: Biagio Borg, MD as PCP - General (Internal Medicine) Debara Pickett Nadean Corwin, MD as PCP - Cardiology (Cardiology) Sueanne Margarita, MD as PCP - Sleep Medicine (Cardiology)  DIAGNOSIS:    ICD-10-CM   1. Erythrocytosis and decreased platelet count.  D75.1     CHIEF COMPLIANT: Follow-up of secondary polycythemia and thrombocytopenia  INTERVAL HISTORY: Matthew Guerrero is a 64 y.o. with above-mentioned history of secondary polycythemia and low-grade ITP. He presents to the clinic today for annual follow-up.   He has not had any new health issues or problems.  He denies any bruising or bleeding or nosebleeds.  His energy levels are excellent.  ALLERGIES:  has No Known Allergies.  MEDICATIONS:  Current Outpatient Medications  Medication Sig Dispense Refill  . carvedilol (COREG) 25 MG tablet TAKE 1 TABLET BY MOUTH TWO  TIMES DAILY WITH A MEAL 180 tablet 3  . cetirizine (ZYRTEC) 10 MG tablet TAKE 1 TABLET(10 MG) BY MOUTH DAILY 30 tablet 5  . colchicine 0.6 MG tablet Take 1 tablet (0.6 mg total) by mouth daily. 90 tablet 3  . emtricitabine-tenofovir AF (DESCOVY) 200-25 MG tablet Take 1 tablet by mouth daily. (Patient not taking: Reported on 12/11/2019) 90 tablet 0  . ENTRESTO 97-103 MG TAKE 1 TABLET BY MOUTH TWO  TIMES DAILY 180 tablet 3  . furosemide (LASIX) 40 MG tablet Take 0.5 tablets (20 mg total) by mouth daily. 90 tablet 3  . pantoprazole (PROTONIX) 40 MG tablet TAKE 1 TABLET BY MOUTH  DAILY 90 tablet 0  . pravastatin (PRAVACHOL) 40 MG tablet Take 1 tablet (40 mg total) by mouth every evening. 90 tablet 3  . spironolactone (ALDACTONE) 25 MG tablet TAKE ONE-HALF TABLET BY  MOUTH DAILY 45 tablet 0   No current facility-administered medications for this visit.    PHYSICAL EXAMINATION: ECOG PERFORMANCE STATUS: 1 - Symptomatic but completely ambulatory  Vitals:   02/19/20 0805  BP: 132/77  Pulse: (!) 59  Resp: 18  Temp: (!) 97.5 F (36.4 C)  SpO2: 100%   Filed  Weights   02/19/20 0805  Weight: 182 lb 8 oz (82.8 kg)    LABORATORY DATA:  I have reviewed the data as listed CMP Latest Ref Rng & Units 08/14/2019 08/02/2019 04/04/2019  Glucose 65 - 99 mg/dL 132(H) 142(H) 96  BUN 8 - 27 mg/dL 12 15 8   Creatinine 0.76 - 1.27 mg/dL 1.26 2.30(H) 1.04  Sodium 134 - 144 mmol/L 143 141 139  Potassium 3.5 - 5.2 mmol/L 4.0 4.4 3.8  Chloride 96 - 106 mmol/L 104 100 102  CO2 20 - 29 mmol/L 23 26 30   Calcium 8.6 - 10.2 mg/dL 9.5 9.4 9.7  Total Protein 6.0 - 8.3 g/dL - - 6.9  Total Bilirubin 0.2 - 1.2 mg/dL - - 0.6  Alkaline Phos 39 - 117 U/L - - 46  AST 0 - 37 U/L - - 14  ALT 0 - 53 U/L - - 12    Lab Results  Component Value Date   WBC 5.2 02/19/2020   HGB 14.9 02/19/2020   HCT 46.7 02/19/2020   MCV 90.9 02/19/2020   PLT 112 (L) 02/19/2020   NEUTROABS 2.3 02/19/2020    ASSESSMENT & PLAN:  Erythrocytosis and decreased platelet count. Polycythemia, secondary Negative JAK2 V617F mutation. Erythropoeitin 18.6 (mildly elevated) 10/26/14 Recommendation: 1. Most likely related to underlying cardiac disease 2. Phlebotomy is not the standard of care for secondary polycythemia. Today's hemoglobin 14.9  Thrombocytopenia: Suspicious  for low grade ITP (Large platelets on smear suggest increased destruction). Today is 112 which is slowly declining compared to previous levels. Immature platelet fraction: 9.7: Elevated suggesting ITP as the possible diagnosis. Today's platelet 112 No indication for treatment.  We will continue watchful monitoring and return to clinic in 1 year for follow-up    No orders of the defined types were placed in this encounter.  The patient has a good understanding of the overall plan. he agrees with it. he will call with any problems that may develop before the next visit here.  Total time spent: 20 mins including face to face time and time spent for planning, charting and coordination of care  Nicholas Lose,  MD 02/19/2020  I, Cloyde Reams Dorshimer, am acting as scribe for Dr. Nicholas Lose.  I have reviewed the above documentation for accuracy and completeness, and I agree with the above.

## 2020-02-19 ENCOUNTER — Telehealth: Payer: Self-pay | Admitting: Hematology and Oncology

## 2020-02-19 ENCOUNTER — Inpatient Hospital Stay: Payer: Medicare Other

## 2020-02-19 ENCOUNTER — Other Ambulatory Visit: Payer: Self-pay | Admitting: Hematology and Oncology

## 2020-02-19 ENCOUNTER — Inpatient Hospital Stay: Payer: Medicare Other | Attending: Hematology and Oncology | Admitting: Hematology and Oncology

## 2020-02-19 ENCOUNTER — Ambulatory Visit: Payer: Medicare Other | Admitting: Pharmacist

## 2020-02-19 ENCOUNTER — Other Ambulatory Visit: Payer: Self-pay

## 2020-02-19 DIAGNOSIS — D751 Secondary polycythemia: Secondary | ICD-10-CM

## 2020-02-19 DIAGNOSIS — Z79899 Other long term (current) drug therapy: Secondary | ICD-10-CM | POA: Diagnosis not present

## 2020-02-19 DIAGNOSIS — D693 Immune thrombocytopenic purpura: Secondary | ICD-10-CM | POA: Insufficient documentation

## 2020-02-19 LAB — CBC WITH DIFFERENTIAL (CANCER CENTER ONLY)
Abs Immature Granulocytes: 0.01 10*3/uL (ref 0.00–0.07)
Basophils Absolute: 0 10*3/uL (ref 0.0–0.1)
Basophils Relative: 0 %
Eosinophils Absolute: 0.1 10*3/uL (ref 0.0–0.5)
Eosinophils Relative: 3 %
HCT: 46.7 % (ref 39.0–52.0)
Hemoglobin: 14.9 g/dL (ref 13.0–17.0)
Immature Granulocytes: 0 %
Lymphocytes Relative: 44 %
Lymphs Abs: 2.3 10*3/uL (ref 0.7–4.0)
MCH: 29 pg (ref 26.0–34.0)
MCHC: 31.9 g/dL (ref 30.0–36.0)
MCV: 90.9 fL (ref 80.0–100.0)
Monocytes Absolute: 0.4 10*3/uL (ref 0.1–1.0)
Monocytes Relative: 8 %
Neutro Abs: 2.3 10*3/uL (ref 1.7–7.7)
Neutrophils Relative %: 45 %
Platelet Count: 112 10*3/uL — ABNORMAL LOW (ref 150–400)
RBC: 5.14 MIL/uL (ref 4.22–5.81)
RDW: 13.4 % (ref 11.5–15.5)
WBC Count: 5.2 10*3/uL (ref 4.0–10.5)
nRBC: 0 % (ref 0.0–0.2)

## 2020-02-19 LAB — IMMATURE PLATELET FRACTION: Immature Platelet Fraction: 9.8 % — ABNORMAL HIGH (ref 1.2–8.6)

## 2020-02-19 NOTE — Assessment & Plan Note (Signed)
Polycythemia, secondary Negative JAK2 V617F mutation. Erythropoeitin 18.6 (mildly elevated) 10/26/14 Recommendation: 1. Most likely related to underlying cardiac disease 2. Phlebotomy is not the standard of care for secondary polycythemia. However if his Hb crosses 20, Or if he develops symptoms of congestion, we can consider doing it.  Thrombocytopenia: Suspicious for low grade ITP (Large platelets on smear suggest increased destruction). Today is  Lab review:  Return to clinic in 1 year for follow-up

## 2020-02-19 NOTE — Telephone Encounter (Signed)
Scheduled appointment per 8/30 los. Patient declined calendar print out.

## 2020-02-20 DIAGNOSIS — Z03818 Encounter for observation for suspected exposure to other biological agents ruled out: Secondary | ICD-10-CM | POA: Diagnosis not present

## 2020-02-27 DIAGNOSIS — Z03818 Encounter for observation for suspected exposure to other biological agents ruled out: Secondary | ICD-10-CM | POA: Diagnosis not present

## 2020-03-05 ENCOUNTER — Other Ambulatory Visit: Payer: Self-pay

## 2020-03-05 ENCOUNTER — Ambulatory Visit (HOSPITAL_COMMUNITY): Payer: Medicare Other | Attending: Cardiovascular Disease

## 2020-03-05 DIAGNOSIS — Z03818 Encounter for observation for suspected exposure to other biological agents ruled out: Secondary | ICD-10-CM | POA: Diagnosis not present

## 2020-03-05 DIAGNOSIS — I428 Other cardiomyopathies: Secondary | ICD-10-CM

## 2020-03-05 LAB — ECHOCARDIOGRAM COMPLETE
Area-P 1/2: 2.48 cm2
S' Lateral: 4 cm
Single Plane A4C EF: 53.8 %

## 2020-03-07 ENCOUNTER — Ambulatory Visit: Payer: Medicare Other | Admitting: Internal Medicine

## 2020-03-09 ENCOUNTER — Other Ambulatory Visit: Payer: Self-pay | Admitting: Internal Medicine

## 2020-03-10 NOTE — Telephone Encounter (Signed)
Please refill as per office routine med refill policy (all routine meds refilled for 3 mo or monthly per pt preference up to one year from last visit, then month to month grace period for 3 mo, then further med refills will have to be denied)  

## 2020-03-12 DIAGNOSIS — Z03818 Encounter for observation for suspected exposure to other biological agents ruled out: Secondary | ICD-10-CM | POA: Diagnosis not present

## 2020-03-18 ENCOUNTER — Ambulatory Visit (INDEPENDENT_AMBULATORY_CARE_PROVIDER_SITE_OTHER): Payer: Medicare Other | Admitting: Internal Medicine

## 2020-03-18 ENCOUNTER — Encounter: Payer: Self-pay | Admitting: Internal Medicine

## 2020-03-18 ENCOUNTER — Other Ambulatory Visit: Payer: Self-pay

## 2020-03-18 VITALS — BP 148/80 | HR 52 | Ht 70.0 in | Wt 178.0 lb

## 2020-03-18 DIAGNOSIS — I5022 Chronic systolic (congestive) heart failure: Secondary | ICD-10-CM

## 2020-03-18 DIAGNOSIS — G4733 Obstructive sleep apnea (adult) (pediatric): Secondary | ICD-10-CM | POA: Diagnosis not present

## 2020-03-18 DIAGNOSIS — I428 Other cardiomyopathies: Secondary | ICD-10-CM | POA: Diagnosis not present

## 2020-03-18 DIAGNOSIS — I10 Essential (primary) hypertension: Secondary | ICD-10-CM

## 2020-03-18 DIAGNOSIS — Z95810 Presence of automatic (implantable) cardiac defibrillator: Secondary | ICD-10-CM

## 2020-03-18 NOTE — Progress Notes (Signed)
OFFICE NOTE  Chief Complaint:  No complaints  Primary Care Physician: Biagio Borg, MD  HPI:  Matthew Guerrero is a 64 y.o. male with a hx of HTN, HL, gout, asthma. He was seen in the emergency room in late March with abdominal pain and vomiting. ECG was abnormal and a fast scan demonstrated no pericardial effusion but global hypokinesis. Plan was to follow-up as an outpatient. However, he presented back to the emergency room with progressive shortness of breath and chest pain with exertion. Admitted 4/5-4/8 with acute systolic HF. EF was noted to be 15-20% on echo. Troponin was minimally elevated (0.14). He was diuresed. LHC was arranged and demonstrated normal coronary arteries. He was dx with NICM. Medications were adjusted for CHF and he returns for FU. Of note, ACE inhibitor was stopped and he was placed on Entresto.   Since DC, he is doing well. His breathing is improved. He is NYHA 2b. He denies orthopnea, PND, edema. He denies chest pain, syncope. Does admit to snoring and does take daytime naps. He has a non-productive cough.    Studies/Reports Reviewed Today:  Echo 09/27/14 - Mild LVH. EF 15% to 20%. Diffuse hypokinesis. There is akinesis of the entireanteroseptal myocardium. Grade 1 diastolic dysfunction. There is muscular trabeculation at apex of left ventricle (no obvious thrombus identified). Definity contrast used. - Mitral valve: There was mild regurgitation. - Left atrium: The atrium was moderately dilated. - Right ventricle: The cavity size was moderately dilated. Wall thickness was normal. - Right atrium: The atrium was mildly dilated.  LHC 09/27/14 The left main coronary artery is normal. The left anterior descending artery is normal. The left circumflex artery is normal. The right coronary artery is dominant and normal. EF: 15%.  [name] has an upcoming sleep study which should be helpful hopefully and may be contributing to his  erythrocytosis. I suspect the etiology of his heart failure is hypertensive heart disease. There remains to be seen whether he'll have improvement in LV function. He seems to be tolerating his medications at this point. He does get somewhat fatigued with walking up stairs and doing activities. I suspect a benefit from cardiac rehabilitation.  I saw [name] back in the office today. He is here for a limited echo follow-up which was performed on 02/08/2015. Unfortunately this shows the EF to to remain between 10 and 15%. You have his medication shows near optimal therapy as he is currently on aspirin, carvedilol, Lasix, and Entresto 49/51 mg. He seems to be tolerating the medications and has NYHA class II symptoms.  [name] returns today for follow-up. He recently had placement of an AICD for primary prevention of sudden cardiac death by Dr. Caryl Comes in 04-10-2023. This went well and since then he his not had any significant problems. He denies any device firings. He had one episode which was difficult to understand that happened in church but required some reprogramming. Weight is stable at 255 which was his weight in 2023-04-10. He continues to be compliant with CPAP and has an appointment to see Dr. Radford Pax for reevaluation in December. He is also seeing Dr. Caryl Comes back in January. He is currently applying for disability. It should be noted that he has class II symptoms and does get short of breath with moderate exertion and is unable to lift more than 10 pounds routinely. His previous job required more physical work than this.  10/25/2015  [name] returns today for follow-up. He's done exceedingly well.  He seems to be tolerating Entresto at the 49/50 one dose. His weight has been stable, in fact it is improved to 230 pounds. He denies any worsening shortness of breath. He currently has class 1-2 symptoms. He's had no AICD firings. He uses CPAP and is been compliant with that. He has a follow-up with  Dr. Radford Pax this summer. He also has an ICD clinic follow-up as well. He is requesting a handicap parking sticker today which I will provide given the significance of his cardiomyopathy.   07/27/2016  [name] was seen in the office for follow-up today. He continues to do very well. He is completely asymptomatic. Weight is now down further from 230 pounds to 217 pounds. He is on the middle dose of interest oh by hesitant to increase the dose higher due to his blood pressure. He is also on a high dose of carvedilol, spironolactone, aspirin and Lasix. He has had no indication of worsening lead impedance on his AICD remote checks. He denies any shocks. He continues to have class 1-2 symptoms. His last echo in August 2017 showed an EF of 15% without any significant improvement. He remains compliant with CPAP.  02/08/2017  [name] returns today for follow-up. He seems to be tolerating Entresto. Recently had an echo which showed improvement in LV function up from 15% to 25-30%. He does have an AICD in place. I've encouraged by the improvement in LV function which may be due to additional medical therapy. He's asymptomatic for most of the time with NYHA class 1-2 symptoms. Blood pressure is stable in the 120s over 70s. EKG shows a sinus rhythm with LVH and repolarization abnormality at 63-personally reviewed. Weight is been stable as well. He denies chest pain or worsening shortness of breath. He is compliant with his CPAP. He has an in office defibrillator check in September.  08/24/2017  [name] was seen today for follow-up.  He continues to do very well.  He is on high-dose Entresto.  He has had improvement in LV function up to 25-30% as of August 2018.  His AICD is followed by Dr. Caryl Comes and is working appropriately.  His weight has been stable.  He continues to have mostly class I to class II NYHA symptoms.  Blood pressure is at goal.  EKG is unchanged.  He is compliant with CPAP.  03/07/2018  Mr.  Matthew Guerrero is seen today in follow-up.  Overall he is doing well on his medications endorses NYHA class I symptoms.  He did have some interval improvement in LVEF up to 25 to 30% last year.  He has an AICD which has not fired and is seen by Dr. Caryl Comes.  He has an appointment with Dr. Caryl Comes tomorrow.  Blood pressures a goal cholesterol is been at goal.  He reports compliance with CPAP.  Overall he is doing well.  Weight is 3 pounds lighter than his last visit.  09/05/2018  [name] is seen today in follow-up.  He continues to do extremely well.  He has NYHA class I symptoms.  EF is somewhere around 25% based on his last echo.  He has a subcutaneous AICD which is not fired and is working appropriately.  He is also on CPAP for obstructive sleep apnea and sees Dr. Radford Pax again this summer.  He is compliant with this and finds that he sleeps well with good energy levels.  Weight is down 5 pounds since his last visit.  His LDL is less than 70  which is at target.  03/18/2020  [name] is seen today for follow-up.  He continues to do well.  I am pleased to report his LVEF is further increased up to 35 to 40 percent with continued NYHA class I-II symptoms.  He does have upcoming follow-up with EP for evaluation of his defibrillator.  Apparently he is close to ERI.  Weight has been stable.  He is on max dose Entresto, carvedilol, Aldactone and 20 mg Lasix daily.  EKG shows a sinus bradycardia with incomplete left bundle branch block.  LDL 84 in October 2020.  He continues to work with Dr. Radford Pax for obstructive sleep apnea.  PMHx:  Past Medical History:  Diagnosis Date  . ALLERGIC RHINITIS   . Asthma   . Cervical radiculitis   . CHF (congestive heart failure) ([place])   . Chronic systolic heart failure ([place]) 02/28/2015  . GERD (gastroesophageal reflux disease)   . GOUT   . Hyperglycemia 08/28/2014  . HYPERLIPIDEMIA   . HYPERTENSION   . Lateral epicondylitis of right elbow   . Nonischemic cardiomyopathy ([place])  09/28/2014  . Obesity (BMI 30-39.9) 06/06/2015  . S/P cardiac cath wjith normal coronary arteries  09/28/2014  . Sleep apnea     Past Surgical History:  Procedure Laterality Date  . COLONOSCOPY    . COLONOSCOPY WITH PROPOFOL N/A 06/17/2017   Procedure: COLONOSCOPY WITH PROPOFOL;  Surgeon: Milus Banister, MD;  Location: [facility];  Service: Endoscopy;  Laterality: N/A;  . EP IMPLANTABLE DEVICE N/A 03/21/2015   Procedure: SubQ ICD Implant;  Surgeon: Deboraha Sprang, MD;  Location: [place] [facility];  Service: Cardiovascular;  Laterality: N/A;  . LEFT AND RIGHT HEART CATHETERIZATION WITH CORONARY ANGIOGRAM N/A 09/27/2014   Procedure: LEFT AND RIGHT HEART CATHETERIZATION WITH CORONARY ANGIOGRAM;  Surgeon: Belva Crome, MD;  Location: [facility] CATH LAB;  Service: Cardiovascular;  Laterality: N/A;  . SUBQ ICD  03/21/2015    FAMHx:  Family History  Problem Relation Age of Onset  . Lung cancer Mother   . Diabetes Father   . Heart disease Father   . Heart attack Father 67  . Hypertension Sister   . Aneurysm Sister 13       brain aneurysm  . Healthy Brother   . Healthy Brother   . Healthy Brother   . Colon cancer Neg Hx   . Esophageal cancer Neg Hx   . Rectal cancer Neg Hx   . Stomach cancer Neg Hx     SOCHx:   reports that he has never smoked. He has never used smokeless tobacco. He reports that he does not drink alcohol and does not use drugs.  ALLERGIES:  No Known Allergies  ROS: Pertinent items noted in HPI and remainder of comprehensive ROS otherwise negative.  HOME MEDS: Current Outpatient Medications  Medication Sig Dispense Refill  . carvedilol (COREG) 25 MG tablet TAKE 1 TABLET BY MOUTH TWO  TIMES DAILY WITH A MEAL 180 tablet 3  . colchicine 0.6 MG tablet TAKE 1 TABLET BY MOUTH  DAILY 90 tablet 3  . ENTRESTO 97-103 MG TAKE 1 TABLET BY MOUTH TWO  TIMES DAILY 180 tablet 3  . furosemide (LASIX) 40 MG tablet Take 0.5 tablets (20 mg total) by mouth daily. 90 tablet 3  .  pantoprazole (PROTONIX) 40 MG tablet TAKE 1 TABLET BY MOUTH  DAILY 90 tablet 0  . pravastatin (PRAVACHOL) 40 MG tablet Take 1 tablet (40 mg total) by mouth every evening.  90 tablet 3  . spironolactone (ALDACTONE) 25 MG tablet TAKE ONE-HALF TABLET BY  MOUTH DAILY 45 tablet 0   No current facility-administered medications for this visit.    LABS/IMAGING: No results found for this or any previous visit (from the past 48 hour(s)). No results found.  WEIGHTS: Wt Readings from Last 3 Encounters:  03/18/20 178 lb (80.7 kg)  02/19/20 182 lb 8 oz (82.8 kg)  12/11/19 185 lb 6.4 oz (84.1 kg)    VITALS: BP (!) 148/80   Pulse (!) 52   Ht 5\' 10"  (1.778 m)   Wt 178 lb (80.7 kg)   BMI 25.54 kg/m   EXAM: General appearance: alert and no distress Neck: no carotid bruit, no JVD and thyroid not enlarged, symmetric, no tenderness/mass/nodules Lungs: clear to auscultation bilaterally Heart: regular rate and rhythm, S1, S2 normal, no murmur, click, rub or gallop and AICD pocket is intact Abdomen: soft, non-tender; bowel sounds normal; no masses,  no organomegaly Extremities: extremities normal, atraumatic, no cyanosis or edema Pulses: 2+ and symmetric Skin: Skin color, texture, turgor normal. No rashes or lesions Neurologic: Grossly normal Psych: Pleasant  EKG: Sinus bradycardia 52, incomplete left bundle branch block, inferior T wave changes-personally reviewed  ASSESSMENT: 1. Nonischemic, probably hypertensive cardiomyopathy, EF 15% with normal coronaries-NYHA class I-II symptoms (EF improved to 25 - 02/2018, now 35-40% - 02/2020) 2. Hypertension-controlled 3. Dyslipidemia 4. OSA on CPAP 5. Gout 6. Status post AICD for primary prevention of sudden cardiac death  PLAN: 1.   [name] is doing very well with NYHA class I-II symptoms.  His EF is now higher at 35 to 40%  He is close to ERI with his defibrillator.  Despite improvement in LVEF, I think that I would recommend replacement of his  AICD because there is risk that his EF may bounce up and down.  He seems to be in agreement with this.  Follow-up with me in 6 months.  Pixie Casino, MD, [facility], [place] Director of the Advanced Lipid Disorders &  Cardiovascular Risk Reduction Clinic Diplomate of the American Board of Clinical Lipidology Attending Cardiologist  Direct Dial: [phone]  Fax: [phone]  Website:  [email or web address]  Nadean Corwin Shauntel Prest 03/18/2020, 9:12 AM

## 2020-03-18 NOTE — Patient Instructions (Signed)

## 2020-03-21 DIAGNOSIS — Z03818 Encounter for observation for suspected exposure to other biological agents ruled out: Secondary | ICD-10-CM | POA: Diagnosis not present

## 2020-03-25 ENCOUNTER — Ambulatory Visit (INDEPENDENT_AMBULATORY_CARE_PROVIDER_SITE_OTHER): Payer: Medicare Other

## 2020-03-25 DIAGNOSIS — I428 Other cardiomyopathies: Secondary | ICD-10-CM | POA: Diagnosis not present

## 2020-03-25 DIAGNOSIS — I5022 Chronic systolic (congestive) heart failure: Secondary | ICD-10-CM

## 2020-03-26 DIAGNOSIS — Z03818 Encounter for observation for suspected exposure to other biological agents ruled out: Secondary | ICD-10-CM | POA: Diagnosis not present

## 2020-03-26 LAB — CUP PACEART REMOTE DEVICE CHECK
Battery Remaining Percentage: 43 %
Date Time Interrogation Session: 20211004100700
Implantable Lead Implant Date: 20160929
Implantable Lead Location: 753862
Implantable Lead Model: 3401
Implantable Pulse Generator Implant Date: 20160929
Pulse Gen Serial Number: 118449

## 2020-03-27 NOTE — Progress Notes (Signed)
Remote ICD transmission.   

## 2020-03-29 ENCOUNTER — Other Ambulatory Visit: Payer: Self-pay | Admitting: Internal Medicine

## 2020-03-29 DIAGNOSIS — E785 Hyperlipidemia, unspecified: Secondary | ICD-10-CM

## 2020-03-30 NOTE — Telephone Encounter (Signed)
Please refill as per office routine med refill policy (all routine meds refilled for 3 mo or monthly per pt preference up to one year from last visit, then month to month grace period for 3 mo, then further med refills will have to be denied)  

## 2020-04-01 ENCOUNTER — Ambulatory Visit: Payer: Medicare Other

## 2020-04-01 ENCOUNTER — Other Ambulatory Visit: Payer: Self-pay

## 2020-04-01 DIAGNOSIS — E785 Hyperlipidemia, unspecified: Secondary | ICD-10-CM

## 2020-04-01 MED ORDER — PRAVASTATIN SODIUM 40 MG PO TABS: 40.0000 mg | ORAL_TABLET | Freq: Every evening | ORAL | 0 refills | Status: DC

## 2020-04-02 DIAGNOSIS — Z03818 Encounter for observation for suspected exposure to other biological agents ruled out: Secondary | ICD-10-CM | POA: Diagnosis not present

## 2020-04-02 NOTE — Progress Notes (Signed)
Cardiology Office Note Date:  04/02/2020  Patient ID:  Matthew Guerrero, Matthew Guerrero 04/19/1956, MRN [id number] PCP:  Biagio Borg, MD  Cardiologist:  Dr. Debara Pickett Electrophysiologist: Dr. Caryl Comes OSA: Dr. Radford Pax   Chief Complaint:   64yo follow up  History of Present Illness: Matthew Guerrero is a 64 y.o. male with history of HTN, HLD, gout, asthma, NICM, S-ICD, OSA w/CPAP, chronic CHF (systolic)  The patient comes in today to be seen for Dr. Caryl Comes.  He last saw hi in Sept 2019, doing well. Dr. Caryl Comes makes note that he had TWOS in the secondary vector and programmed in the alternate.    I saw him Oct 2020,  he felt very well.  Denied any CP, palpitations or SOB, no DOE or exertional intolerances, no symptoms of PND or orthopnea.  No near syncope or syncope, no shocks. His weight had fluctuated and he mentions that he he had taken a detoxify regime/pills a few months back that he lost several pounds with and then another pill that apparently was to reset everything back and regain his weight but in the right way. I cautioned him on taking such regimes without MD guidance and clearance. His device interrogation was normal, 54% battery used  I saw him 08/02/19 He feels quite well.  No CP, palpitations or exertional incapacities.  He denies any rest or exertional SOB, no symptoms of PND or orthopnea. No dizzy spells, near syncope or syncope. He weighs daily and reports stable weight He is tolerating his medicines, reports Delene Loll is covered well for him He was advised of device advisory, battery life looked OK at 50%, alert tone was demonstrated No changes were made.  He saw Dr. Debara Pickett 03/18/2020, was doing well, on GDMT, following as well with Dr. Radford Pax for his OSA.  Felt to have class I-II symptoms, and despite some improvement in his LVEF when gen change needed to be done, recommended proceeding with another ICD, given likelihood of his EF waxing/waning.   TODAY He continues to  Feel quite  well.  No CP, palpitations or SOB.  Denies DOE, remains active around his house, yard work, errands.  No symptoms of PND or orthopnea, no dizzy spells, near syncope or syncope. His PMD monitors his labs regularly No shocks   Device information BSCi S-ICD, implanted 03/21/15, Dr. Caryl Comes Device lead sensing configuration is on alternate 2/2 TWOS Implanted for primary prevention I do not see h/o of any appropriate therapies Device is on early battery depletion advisory He is enrolled in [employer] home monitoring  Past Medical History:  Diagnosis Date  . ALLERGIC RHINITIS   . Asthma   . Cervical radiculitis   . CHF (congestive heart failure) ([place])   . Chronic systolic heart failure ([place]) 02/28/2015  . GERD (gastroesophageal reflux disease)   . GOUT   . Hyperglycemia 08/28/2014  . HYPERLIPIDEMIA   . HYPERTENSION   . Lateral epicondylitis of right elbow   . Nonischemic cardiomyopathy ([place]) 09/28/2014  . Obesity (BMI 30-39.9) 06/06/2015  . S/P cardiac cath wjith normal coronary arteries  09/28/2014  . Sleep apnea     Past Surgical History:  Procedure Laterality Date  . COLONOSCOPY    . COLONOSCOPY WITH PROPOFOL N/A 06/17/2017   Procedure: COLONOSCOPY WITH PROPOFOL;  Surgeon: Milus Banister, MD;  [facility]: [facility];  Service: Endoscopy;  Laterality: N/A;  . EP IMPLANTABLE DEVICE N/A 03/21/2015   Procedure: SubQ ICD Implant;  Surgeon: Deboraha Sprang, MD;  Location: [place] [facility]  LAB;  Service: Cardiovascular;  Laterality: N/A;  . LEFT AND RIGHT HEART CATHETERIZATION WITH CORONARY ANGIOGRAM N/A 09/27/2014   Procedure: LEFT AND RIGHT HEART CATHETERIZATION WITH CORONARY ANGIOGRAM;  Surgeon: Belva Crome, MD;  Location: [facility] CATH LAB;  Service: Cardiovascular;  Laterality: N/A;  . SUBQ ICD  03/21/2015    Current Outpatient Medications  Medication Sig Dispense Refill  . carvedilol (COREG) 25 MG tablet TAKE 1 TABLET BY MOUTH TWO  TIMES DAILY WITH A MEAL 180 tablet 3  . colchicine 0.6 MG  tablet TAKE 1 TABLET BY MOUTH  DAILY 90 tablet 3  . ENTRESTO 97-103 MG TAKE 1 TABLET BY MOUTH TWO  TIMES DAILY 180 tablet 3  . furosemide (LASIX) 40 MG tablet Take 0.5 tablets (20 mg total) by mouth daily. 90 tablet 3  . pantoprazole (PROTONIX) 40 MG tablet TAKE 1 TABLET BY MOUTH  DAILY 90 tablet 0  . pravastatin (PRAVACHOL) 40 MG tablet Take 1 tablet (40 mg total) by mouth every evening. 90 tablet 0  . spironolactone (ALDACTONE) 25 MG tablet TAKE ONE-HALF TABLET BY  MOUTH DAILY 45 tablet 0   No current facility-administered medications for this visit.    Allergies:   Patient has no known allergies.   Social History:  The patient  reports that he has never smoked. He has never used smokeless tobacco. He reports that he does not drink alcohol and does not use drugs.   Family History:  The patient's family history includes Aneurysm (age of onset: 49) in his sister; Diabetes in his father; Healthy in his brother, brother, and brother; Heart attack (age of onset: 25) in his father; Heart disease in his father; Hypertension in his sister; Lung cancer in his mother.  ROS:  Please see the history of present illness.  All other systems are reviewed and otherwise negative.   PHYSICAL EXAM:  VS:  There were no vitals taken for this visit. BMI: There is no height or weight on file to calculate BMI. Well nourished, well developed, in no acute distress  HEENT: normocephalic, atraumatic  Neck: no JVD, carotid bruits or masses Cardiac:  RRR; no significant murmurs, no rubs, or gallops Lungs: CTA b/l, no wheezing, rhonchi or rales  Abd: soft, nontender MS: no deformity or atrophy Ext:    no edema  Skin: warm and dry, no rash Neuro:  No gross deficits appreciated Psych: euthymic mood, full affect  S-ICD site is stable, no tethering or discomfort   EKG:  Done today and reviewed by myself:  SR 77bpm,   ICD interrogation done today and reviewed by myself:  Battery is at 42% No arrhythmias or  therapies, electrode impedance is ok  Oct 2021 Batt 42% Feb 2021: 50% Jan 2021 battery 52% Oct 2020 battery 54%   03/05/2020 TTE IMPRESSIONS  1. Left ventricular ejection fraction, by estimation, is 35 to 40%. The  left ventricle has moderately decreased function. The left ventricle  demonstrates global hypokinesis. Left ventricular diastolic parameters are  consistent with Grade I diastolic  dysfunction (impaired relaxation).  2. Right ventricular systolic function is normal. The right ventricular  size is normal. Tricuspid regurgitation signal is inadequate for assessing  PA pressure.  3. The mitral valve is grossly normal. Trivial mitral valve  regurgitation. No evidence of mitral stenosis.  4. The aortic valve is tricuspid. Aortic valve regurgitation is trivial.  No aortic stenosis is present.  5. The inferior vena cava is normal in size with greater than 50%  respiratory variability, suggesting right atrial pressure of 3 mmHg.   Comparison(s): Changes from prior study are noted. EF has improved to  35-40%. LV dimensions within normal limits.    03/14/18: TTE Study Conclusions - Left ventricle: The cavity size was severely dilated. Wall   thickness was normal. The estimated ejection fraction was 25%.   Diffuse hypokinesis. - Atrial septum: No defect or patent foramen ovale was identified.    Recent Labs: 04/04/2019: ALT 12; TSH 1.60 08/14/2019: BUN 12; Creatinine, Ser 1.26; Potassium 4.0; Sodium 143 02/19/2020: Hemoglobin 14.9; Platelet Count 112  04/04/2019: Cholesterol 157; HDL 41.50; LDL Cholesterol 84; Total CHOL/HDL Ratio 4; Triglycerides 160.0; VLDL 32.0   CrCl cannot be calculated (Patient's most recent lab result is older than the maximum 21 days allowed.).   Wt Readings from Last 3 Encounters:  03/18/20 178 lb (80.7 kg)  02/19/20 182 lb 8 oz (82.8 kg)  12/11/19 185 lb 6.4 oz (84.1 kg)     Other studies reviewed: Additional studies/records reviewed  today include: summarized above  ASSESSMENT AND PLAN:  1. S-ICD     Device is included in early depletion advisory     Alert tone was demonstrated at his prior visit     Re-discussed importance remote monitoring, he is enrolled in [employer] and doing remotes       2. NICM 3. Chronic CHF (systolic)     No symptoms or exam findings to suggest fluid OL     Follows with Dr. Debara Pickett       4. HTN     Looks OK, no changes    Disposition: will see him back in 45mo, sooner if needed, continue remotes as usual    Current medicines are reviewed at length with the patient today.  The patient did not have any concerns regarding medicines.  Venetia Night, PA-C 04/02/2020 11:05 AM     [facility] [place] [id number] [phone] (office)  [phone] (fax)

## 2020-04-04 ENCOUNTER — Encounter: Payer: Self-pay | Admitting: Internal Medicine

## 2020-04-04 ENCOUNTER — Other Ambulatory Visit: Payer: Self-pay

## 2020-04-04 ENCOUNTER — Ambulatory Visit (INDEPENDENT_AMBULATORY_CARE_PROVIDER_SITE_OTHER): Payer: Medicare Other | Admitting: Internal Medicine

## 2020-04-04 VITALS — BP 130/70 | HR 91 | Temp 98.5°F | Ht 70.0 in | Wt 177.0 lb

## 2020-04-04 DIAGNOSIS — E538 Deficiency of other specified B group vitamins: Secondary | ICD-10-CM

## 2020-04-04 DIAGNOSIS — J309 Allergic rhinitis, unspecified: Secondary | ICD-10-CM | POA: Diagnosis not present

## 2020-04-04 DIAGNOSIS — Z0001 Encounter for general adult medical examination with abnormal findings: Secondary | ICD-10-CM

## 2020-04-04 DIAGNOSIS — R7303 Prediabetes: Secondary | ICD-10-CM

## 2020-04-04 DIAGNOSIS — F4381 Prolonged grief disorder: Secondary | ICD-10-CM | POA: Insufficient documentation

## 2020-04-04 DIAGNOSIS — M109 Gout, unspecified: Secondary | ICD-10-CM

## 2020-04-04 DIAGNOSIS — Z0000 Encounter for general adult medical examination without abnormal findings: Secondary | ICD-10-CM

## 2020-04-04 DIAGNOSIS — F4329 Adjustment disorder with other symptoms: Secondary | ICD-10-CM | POA: Diagnosis not present

## 2020-04-04 DIAGNOSIS — E559 Vitamin D deficiency, unspecified: Secondary | ICD-10-CM | POA: Diagnosis not present

## 2020-04-04 DIAGNOSIS — I10 Essential (primary) hypertension: Secondary | ICD-10-CM

## 2020-04-04 DIAGNOSIS — Z23 Encounter for immunization: Secondary | ICD-10-CM

## 2020-04-04 MED ORDER — PREDNISONE 10 MG PO TABS: ORAL_TABLET | ORAL | 0 refills | Status: DC

## 2020-04-04 NOTE — Assessment & Plan Note (Signed)
stable overall by history and exam, recent data reviewed with pt, and pt to continue medical treatment as before,  to f/u any worsening symptoms or concerns  

## 2020-04-04 NOTE — Assessment & Plan Note (Signed)
With seasonal flare, for predpac asd,  to f/u any worsening symptoms or concerns 

## 2020-04-04 NOTE — Patient Instructions (Signed)
You had the Prevnar 13 pneumonia shot today  Please take all new medication as prescribed - the prednisone for allergies  Please continue all other medications as before, and refills have been done if requested.  Please have the pharmacy call with any other refills you may need.  Please continue your efforts at being more active, low cholesterol diet, and weight control.  You are otherwise up to date with prevention measures today.  Please keep your appointments with your specialists as you may have planned  Please go to the LAB at the blood drawing area for the tests to be done at the [place] will be contacted by phone if any changes need to be made immediately.  Otherwise, you will receive a letter about your results with an explanation, but please check with MyChart first.  Please remember to sign up for MyChart if you have not done so, as this will be important to you in the future with finding out test results, communicating by private email, and scheduling acute appointments online when needed.  Please make an Appointment to return for your 1 year visit, or sooner if needed

## 2020-04-04 NOTE — Assessment & Plan Note (Signed)
Asympt, for uric acid with labs 

## 2020-04-04 NOTE — Assessment & Plan Note (Addendum)
To continue counseling sessions, declines SSRI or psychiatry referral  I spent 31 minutes in addition to time for CPX wellness examination in preparing to see the patient by review of recent labs, imaging and procedures, obtaining and reviewing separately obtained history, communicating with the patient and family or caregiver, ordering medications, tests or procedures, and documenting clinical information in the EHR including the differential Dx, treatment, and any further evaluation and other management of prolonged grief, allergies, predM, htn, gout

## 2020-04-04 NOTE — Assessment & Plan Note (Signed)

## 2020-04-04 NOTE — Progress Notes (Signed)
Subjective:    Patient ID: Matthew Guerrero, male    DOB: 1955/11/28, 64 y.o.   MRN: [id number]  HPI  Here for wellness and f/u;  Overall doing ok;  Pt denies Chest pain, worsening SOB, DOE, wheezing, orthopnea, PND, worsening LE edema, palpitations, dizziness or syncope.  Pt denies neurological change such as new headache, facial or extremity weakness.  Pt denies polydipsia, polyuria, or low sugar symptoms. Pt states overall good compliance with treatment and medications, good tolerability, and has been trying to follow appropriate diet.  Pt denies worsening depressive symptoms, suicidal ideation or panic. No fever, night sweats, wt loss, loss of appetite, or other constitutional symptoms.  Pt states good ability with ADL's, has low fall risk, home safety reviewed and adequate, no other significant changes in hearing or vision, and only occasionally active with exercise. Has group therapy every tues and thur, still grieveing over lost friend 2 yrs ago.  Lost wt intentionally with less caloreis recently Wt Readings from Last 3 Encounters:  04/04/20 177 lb (80.3 kg)  03/18/20 178 lb (80.7 kg)  02/19/20 182 lb 8 oz (82.8 kg)  COVID neg 2 days ago.  Does have several wks ongoing nasal allergy symptoms with clearish congestion, itch and sneezing, without fever, pain, ST, cough, swelling or wheezing. pchx Current Outpatient Medications on File Prior to Visit  Medication Sig Dispense Refill  . carvedilol (COREG) 25 MG tablet TAKE 1 TABLET BY MOUTH TWO  TIMES DAILY WITH A MEAL 180 tablet 3  . colchicine 0.6 MG tablet TAKE 1 TABLET BY MOUTH  DAILY 90 tablet 3  . ENTRESTO 97-103 MG TAKE 1 TABLET BY MOUTH TWO  TIMES DAILY 180 tablet 3  . furosemide (LASIX) 40 MG tablet Take 0.5 tablets (20 mg total) by mouth daily. 90 tablet 3  . pantoprazole (PROTONIX) 40 MG tablet TAKE 1 TABLET BY MOUTH  DAILY 90 tablet 0  . pravastatin (PRAVACHOL) 40 MG tablet Take 1 tablet (40 mg total) by mouth every evening. 90  tablet 0  . spironolactone (ALDACTONE) 25 MG tablet TAKE ONE-HALF TABLET BY  MOUTH DAILY 45 tablet 0   No current facility-administered medications on file prior to visit.   Review of Systems All otherwise neg per pt    Objective:   Physical Exam BP 130/70 (BP Location: Left Arm, Patient Position: Sitting, Cuff Size: Large)   Pulse 91   Temp 98.5 F (36.9 C) (Oral)   Ht 5\' 10"  (1.778 m)   Wt 177 lb (80.3 kg)   SpO2 96%   BMI 25.40 kg/m  VS noted,  Constitutional: Pt appears in NAD HENT: Head: NCAT.  Right Ear: External ear normal.  Left Ear: External ear normal.  Eyes: . Pupils are equal, round, and reactive to light. Conjunctivae and EOM are normal Nose: without d/c or deformity Neck: Neck supple. Gross normal ROM Cardiovascular: Normal rate and regular rhythm.   Pulmonary/Chest: Effort normal and breath sounds without rales or wheezing.  Abd:  Soft, NT, ND, + BS, no organomegaly Neurological: Pt is alert. At baseline orientation, motor grossly intact Skin: Skin is warm. No rashes, other new lesions, no LE edema Psychiatric: Pt behavior is normal without agitation  All otherwise neg per pt Lab Results  Component Value Date   WBC 5.2 02/19/2020   HGB 14.9 02/19/2020   HCT 46.7 02/19/2020   PLT 112 (L) 02/19/2020   GLUCOSE 132 (H) 08/14/2019   CHOL 157 04/04/2019   TRIG 160.0 (H) 04/04/2019  HDL 41.50 04/04/2019   LDLDIRECT 105.8 06/18/2010   LDLCALC 84 04/04/2019   ALT 12 04/04/2019   AST 14 04/04/2019   NA 143 08/14/2019   K 4.0 08/14/2019   CL 104 08/14/2019   CREATININE 1.26 08/14/2019   BUN 12 08/14/2019   CO2 23 08/14/2019   TSH 1.60 04/04/2019   PSA 0.73 04/04/2019   INR 1.2 (H) 03/15/2015   HGBA1C 6.0 04/04/2019      Assessment & Plan:

## 2020-04-05 ENCOUNTER — Other Ambulatory Visit (INDEPENDENT_AMBULATORY_CARE_PROVIDER_SITE_OTHER): Payer: Medicare Other

## 2020-04-05 DIAGNOSIS — Z0001 Encounter for general adult medical examination with abnormal findings: Secondary | ICD-10-CM | POA: Diagnosis not present

## 2020-04-05 DIAGNOSIS — E538 Deficiency of other specified B group vitamins: Secondary | ICD-10-CM | POA: Diagnosis not present

## 2020-04-05 DIAGNOSIS — E559 Vitamin D deficiency, unspecified: Secondary | ICD-10-CM | POA: Diagnosis not present

## 2020-04-05 DIAGNOSIS — M109 Gout, unspecified: Secondary | ICD-10-CM

## 2020-04-05 DIAGNOSIS — R7303 Prediabetes: Secondary | ICD-10-CM | POA: Diagnosis not present

## 2020-04-05 LAB — HEPATIC FUNCTION PANEL
ALT: 13 U/L (ref 0–53)
AST: 16 U/L (ref 0–37)
Albumin: 4.7 g/dL (ref 3.5–5.2)
Alkaline Phosphatase: 47 U/L (ref 39–117)
Bilirubin, Direct: 0.1 mg/dL (ref 0.0–0.3)
Total Bilirubin: 0.5 mg/dL (ref 0.2–1.2)
Total Protein: 7.1 g/dL (ref 6.0–8.3)

## 2020-04-05 LAB — CBC WITH DIFFERENTIAL/PLATELET
Basophils Absolute: 0.1 10*3/uL (ref 0.0–0.1)
Basophils Relative: 0.5 % (ref 0.0–3.0)
Eosinophils Absolute: 0 10*3/uL (ref 0.0–0.7)
Eosinophils Relative: 0.4 % (ref 0.0–5.0)
HCT: 46.2 % (ref 39.0–52.0)
Hemoglobin: 15.1 g/dL (ref 13.0–17.0)
Lymphocytes Relative: 11.1 % — ABNORMAL LOW (ref 12.0–46.0)
Lymphs Abs: 1 10*3/uL (ref 0.7–4.0)
MCHC: 32.7 g/dL (ref 30.0–36.0)
MCV: 91.4 fl (ref 78.0–100.0)
Monocytes Absolute: 0.4 10*3/uL (ref 0.1–1.0)
Monocytes Relative: 4.3 % (ref 3.0–12.0)
Neutro Abs: 7.8 10*3/uL — ABNORMAL HIGH (ref 1.4–7.7)
Neutrophils Relative %: 83.7 % — ABNORMAL HIGH (ref 43.0–77.0)
Platelets: 120 10*3/uL — ABNORMAL LOW (ref 150.0–400.0)
RBC: 5.06 Mil/uL (ref 4.22–5.81)
RDW: 14 % (ref 11.5–15.5)
WBC: 9.4 10*3/uL (ref 4.0–10.5)

## 2020-04-05 LAB — URINALYSIS, ROUTINE W REFLEX MICROSCOPIC
Bilirubin Urine: NEGATIVE
Hgb urine dipstick: NEGATIVE
Leukocytes,Ua: NEGATIVE
Nitrite: NEGATIVE
Specific Gravity, Urine: >=1.03 — AB (ref 1.000–1.030)
Total Protein, Urine: NEGATIVE
Urine Glucose: NEGATIVE
Urobilinogen, UA: 0.2 (ref 0.0–1.0)
pH: 5.5 (ref 5.0–8.0)

## 2020-04-05 LAB — URIC ACID: Uric Acid, Serum: 7.7 mg/dL (ref 4.0–7.8)

## 2020-04-05 LAB — BASIC METABOLIC PANEL
BUN: 14 mg/dL (ref 6–23)
CO2: 25 mEq/L (ref 19–32)
Calcium: 9.8 mg/dL (ref 8.4–10.5)
Chloride: 105 mEq/L (ref 96–112)
Creatinine, Ser: 1.06 mg/dL (ref 0.40–1.50)
GFR: 72.98 mL/min (ref >60.00)
Glucose, Bld: 121 mg/dL — ABNORMAL HIGH (ref 70–99)
Potassium: 4.4 mEq/L (ref 3.5–5.1)
Sodium: 138 mEq/L (ref 135–145)

## 2020-04-05 LAB — LIPID PANEL
Cholesterol: 139 mg/dL (ref 0–200)
HDL: 39.4 mg/dL (ref >39.00)
LDL Cholesterol: 86 mg/dL (ref 0–99)
NonHDL: 99.72
Total CHOL/HDL Ratio: 4
Triglycerides: 68 mg/dL (ref 0.0–149.0)
VLDL: 13.6 mg/dL (ref 0.0–40.0)

## 2020-04-05 LAB — VITAMIN D 25 HYDROXY (VIT D DEFICIENCY, FRACTURES): VITD: 34.07 ng/mL (ref 30.00–100.00)

## 2020-04-05 LAB — VITAMIN B12: Vitamin B-12: 113 pg/mL — ABNORMAL LOW (ref 211–911)

## 2020-04-05 LAB — PSA: PSA: 0.89 ng/mL (ref 0.10–4.00)

## 2020-04-05 LAB — TSH: TSH: 0.42 u[IU]/mL (ref 0.35–4.50)

## 2020-04-05 LAB — HEMOGLOBIN A1C: Hgb A1c MFr Bld: 6.1 % (ref 4.6–6.5)

## 2020-04-06 ENCOUNTER — Other Ambulatory Visit: Payer: Self-pay | Admitting: Internal Medicine

## 2020-04-06 ENCOUNTER — Encounter: Payer: Self-pay | Admitting: Internal Medicine

## 2020-04-06 DIAGNOSIS — D696 Thrombocytopenia, unspecified: Secondary | ICD-10-CM

## 2020-04-06 DIAGNOSIS — E538 Deficiency of other specified B group vitamins: Secondary | ICD-10-CM

## 2020-04-06 HISTORY — DX: Thrombocytopenia, unspecified: D69.6

## 2020-04-06 HISTORY — DX: Deficiency of other specified B group vitamins: E53.8

## 2020-04-06 MED ORDER — VITAMIN B-12 1000 MCG PO TABS: 1000.0000 ug | ORAL_TABLET | Freq: Every day | ORAL | 3 refills | Status: DC

## 2020-04-06 NOTE — Telephone Encounter (Signed)
Please refill as per office routine med refill policy (all routine meds refilled for 3 mo or monthly per pt preference up to one year from last visit, then month to month grace period for 3 mo, then further med refills will have to be denied)  

## 2020-04-08 ENCOUNTER — Other Ambulatory Visit: Payer: Self-pay

## 2020-04-08 ENCOUNTER — Ambulatory Visit (INDEPENDENT_AMBULATORY_CARE_PROVIDER_SITE_OTHER): Payer: Medicare Other | Admitting: Physician Assistant

## 2020-04-08 VITALS — BP 136/74 | HR 58 | Ht 70.0 in | Wt 181.0 lb

## 2020-04-08 DIAGNOSIS — Z95810 Presence of automatic (implantable) cardiac defibrillator: Secondary | ICD-10-CM | POA: Diagnosis not present

## 2020-04-08 DIAGNOSIS — I5022 Chronic systolic (congestive) heart failure: Secondary | ICD-10-CM

## 2020-04-08 DIAGNOSIS — I10 Essential (primary) hypertension: Secondary | ICD-10-CM

## 2020-04-08 NOTE — Patient Instructions (Signed)
Medication Instructions:  Your physician recommends that you continue on your current medications as directed. Please refer to the Current Medication list given to you today.  *If you need a refill on your cardiac medications before your next appointment, please call your pharmacy*   Lab Work: [place]   If you have labs (blood work) drawn today and your tests are completely normal, you will receive your results only by: [name] MyChart Message (if you have MyChart) OR . A paper copy in the mail If you have any lab test that is abnormal or we need to change your treatment, we will call you to review the results.   Testing/Procedures:NONE ORDERED  TODAY    Follow-Up: At [facility], you and your health needs are our priority.  As part of our continuing mission to provide you with exceptional heart care, we have created designated Provider Care Teams.  These Care Teams include your primary Cardiologist (physician) and Advanced Practice Providers (APPs -  Physician Assistants and Nurse Practitioners) who all work together to provide you with the care you need, when you need it.  We recommend signing up for the patient portal called "MyChart".  Sign up information is provided on this After Visit Summary.  MyChart is used to connect with patients for Virtual Visits (Telemedicine).  Patients are able to view lab/test results, encounter notes, upcoming appointments, etc.  Non-urgent messages can be sent to your provider as well.   To learn more about what you can do with MyChart, go to [email or web address].    Your next appointment:   6 month(s)  The format for your next appointment:   In Person  Provider:   You may see Dr.Klein. or one of the following Advanced Practice Providers on your designated Care Team:    Chanetta Marshall, NP  Tommye Standard, PA-C  Legrand Como "Jonni Sanger" Chalmers Cater, [place]    Other Instructions

## 2020-04-09 DIAGNOSIS — Z03818 Encounter for observation for suspected exposure to other biological agents ruled out: Secondary | ICD-10-CM | POA: Diagnosis not present

## 2020-04-11 DIAGNOSIS — G4733 Obstructive sleep apnea (adult) (pediatric): Secondary | ICD-10-CM | POA: Diagnosis not present

## 2020-04-14 ENCOUNTER — Other Ambulatory Visit: Payer: Self-pay | Admitting: Internal Medicine

## 2020-04-14 DIAGNOSIS — E785 Hyperlipidemia, unspecified: Secondary | ICD-10-CM

## 2020-04-15 NOTE — Telephone Encounter (Signed)
Please refill as per office routine med refill policy (all routine meds refilled for 3 mo or monthly per pt preference up to one year from last visit, then month to month grace period for 3 mo, then further med refills will have to be denied)  

## 2020-04-16 DIAGNOSIS — Z03818 Encounter for observation for suspected exposure to other biological agents ruled out: Secondary | ICD-10-CM | POA: Diagnosis not present

## 2020-04-21 ENCOUNTER — Other Ambulatory Visit: Payer: Self-pay | Admitting: Internal Medicine

## 2020-04-21 DIAGNOSIS — I428 Other cardiomyopathies: Secondary | ICD-10-CM

## 2020-04-22 NOTE — Telephone Encounter (Signed)
lease refill as per office routine med refill policy (all routine meds refilled for 3 mo or monthly per pt preference up to one year from last visit, then month to month grace period for 3 mo, then further med refills will have to be denied)

## 2020-04-23 DIAGNOSIS — Z03818 Encounter for observation for suspected exposure to other biological agents ruled out: Secondary | ICD-10-CM | POA: Diagnosis not present

## 2020-05-02 DIAGNOSIS — Z03818 Encounter for observation for suspected exposure to other biological agents ruled out: Secondary | ICD-10-CM | POA: Diagnosis not present

## 2020-05-07 DIAGNOSIS — Z20822 Contact with and (suspected) exposure to covid-19: Secondary | ICD-10-CM | POA: Diagnosis not present

## 2020-05-14 DIAGNOSIS — Z03818 Encounter for observation for suspected exposure to other biological agents ruled out: Secondary | ICD-10-CM | POA: Diagnosis not present

## 2020-05-21 DIAGNOSIS — Z03818 Encounter for observation for suspected exposure to other biological agents ruled out: Secondary | ICD-10-CM | POA: Diagnosis not present

## 2020-05-28 DIAGNOSIS — Z03818 Encounter for observation for suspected exposure to other biological agents ruled out: Secondary | ICD-10-CM | POA: Diagnosis not present

## 2020-06-06 DIAGNOSIS — Z03818 Encounter for observation for suspected exposure to other biological agents ruled out: Secondary | ICD-10-CM | POA: Diagnosis not present

## 2020-06-07 ENCOUNTER — Other Ambulatory Visit: Payer: Self-pay | Admitting: Internal Medicine

## 2020-06-18 DIAGNOSIS — Z03818 Encounter for observation for suspected exposure to other biological agents ruled out: Secondary | ICD-10-CM | POA: Diagnosis not present

## 2020-06-25 ENCOUNTER — Ambulatory Visit (INDEPENDENT_AMBULATORY_CARE_PROVIDER_SITE_OTHER): Payer: Medicare Other

## 2020-06-25 DIAGNOSIS — I428 Other cardiomyopathies: Secondary | ICD-10-CM | POA: Diagnosis not present

## 2020-06-25 DIAGNOSIS — Z03818 Encounter for observation for suspected exposure to other biological agents ruled out: Secondary | ICD-10-CM | POA: Diagnosis not present

## 2020-06-25 DIAGNOSIS — Z20822 Contact with and (suspected) exposure to covid-19: Secondary | ICD-10-CM | POA: Diagnosis not present

## 2020-06-25 LAB — CUP PACEART REMOTE DEVICE CHECK
Date Time Interrogation Session: 20220104180457
Implantable Lead Implant Date: 20160929
Implantable Lead Location: 753862
Implantable Lead Model: 3401
Implantable Pulse Generator Implant Date: 20160929
Pulse Gen Serial Number: 118449

## 2020-07-02 DIAGNOSIS — Z20822 Contact with and (suspected) exposure to covid-19: Secondary | ICD-10-CM | POA: Diagnosis not present

## 2020-07-02 DIAGNOSIS — Z03818 Encounter for observation for suspected exposure to other biological agents ruled out: Secondary | ICD-10-CM | POA: Diagnosis not present

## 2020-07-09 NOTE — Progress Notes (Signed)
Remote ICD transmission.   

## 2020-07-10 DIAGNOSIS — Z03818 Encounter for observation for suspected exposure to other biological agents ruled out: Secondary | ICD-10-CM | POA: Diagnosis not present

## 2020-07-10 DIAGNOSIS — Z20822 Contact with and (suspected) exposure to covid-19: Secondary | ICD-10-CM | POA: Diagnosis not present

## 2020-07-16 DIAGNOSIS — Z20822 Contact with and (suspected) exposure to covid-19: Secondary | ICD-10-CM | POA: Diagnosis not present

## 2020-07-16 DIAGNOSIS — Z03818 Encounter for observation for suspected exposure to other biological agents ruled out: Secondary | ICD-10-CM | POA: Diagnosis not present

## 2020-07-23 DIAGNOSIS — Z03818 Encounter for observation for suspected exposure to other biological agents ruled out: Secondary | ICD-10-CM | POA: Diagnosis not present

## 2020-07-23 DIAGNOSIS — Z20822 Contact with and (suspected) exposure to covid-19: Secondary | ICD-10-CM | POA: Diagnosis not present

## 2020-07-30 DIAGNOSIS — Z03818 Encounter for observation for suspected exposure to other biological agents ruled out: Secondary | ICD-10-CM | POA: Diagnosis not present

## 2020-07-30 DIAGNOSIS — Z20822 Contact with and (suspected) exposure to covid-19: Secondary | ICD-10-CM | POA: Diagnosis not present

## 2020-08-06 DIAGNOSIS — Z03818 Encounter for observation for suspected exposure to other biological agents ruled out: Secondary | ICD-10-CM | POA: Diagnosis not present

## 2020-08-06 DIAGNOSIS — Z20822 Contact with and (suspected) exposure to covid-19: Secondary | ICD-10-CM | POA: Diagnosis not present

## 2020-08-07 DIAGNOSIS — G4733 Obstructive sleep apnea (adult) (pediatric): Secondary | ICD-10-CM | POA: Diagnosis not present

## 2020-08-09 DIAGNOSIS — H31002 Unspecified chorioretinal scars, left eye: Secondary | ICD-10-CM | POA: Diagnosis not present

## 2020-08-13 DIAGNOSIS — Z20822 Contact with and (suspected) exposure to covid-19: Secondary | ICD-10-CM | POA: Diagnosis not present

## 2020-08-13 DIAGNOSIS — Z03818 Encounter for observation for suspected exposure to other biological agents ruled out: Secondary | ICD-10-CM | POA: Diagnosis not present

## 2020-08-21 DIAGNOSIS — Z03818 Encounter for observation for suspected exposure to other biological agents ruled out: Secondary | ICD-10-CM | POA: Diagnosis not present

## 2020-08-21 DIAGNOSIS — Z20822 Contact with and (suspected) exposure to covid-19: Secondary | ICD-10-CM | POA: Diagnosis not present

## 2020-08-27 DIAGNOSIS — Z20822 Contact with and (suspected) exposure to covid-19: Secondary | ICD-10-CM | POA: Diagnosis not present

## 2020-08-27 DIAGNOSIS — Z03818 Encounter for observation for suspected exposure to other biological agents ruled out: Secondary | ICD-10-CM | POA: Diagnosis not present

## 2020-09-02 DIAGNOSIS — Z03818 Encounter for observation for suspected exposure to other biological agents ruled out: Secondary | ICD-10-CM | POA: Diagnosis not present

## 2020-09-02 DIAGNOSIS — Z20822 Contact with and (suspected) exposure to covid-19: Secondary | ICD-10-CM | POA: Diagnosis not present

## 2020-09-10 DIAGNOSIS — Z03818 Encounter for observation for suspected exposure to other biological agents ruled out: Secondary | ICD-10-CM | POA: Diagnosis not present

## 2020-09-10 DIAGNOSIS — Z20822 Contact with and (suspected) exposure to covid-19: Secondary | ICD-10-CM | POA: Diagnosis not present

## 2020-09-16 ENCOUNTER — Encounter: Payer: Self-pay | Admitting: Internal Medicine

## 2020-09-16 ENCOUNTER — Ambulatory Visit (INDEPENDENT_AMBULATORY_CARE_PROVIDER_SITE_OTHER): Payer: Medicare Other | Admitting: Internal Medicine

## 2020-09-16 ENCOUNTER — Other Ambulatory Visit: Payer: Self-pay

## 2020-09-16 VITALS — BP 142/100 | HR 72 | Ht 70.0 in | Wt 187.2 lb

## 2020-09-16 DIAGNOSIS — I10 Essential (primary) hypertension: Secondary | ICD-10-CM

## 2020-09-16 DIAGNOSIS — I428 Other cardiomyopathies: Secondary | ICD-10-CM | POA: Diagnosis not present

## 2020-09-16 DIAGNOSIS — I5022 Chronic systolic (congestive) heart failure: Secondary | ICD-10-CM

## 2020-09-16 DIAGNOSIS — Z95810 Presence of automatic (implantable) cardiac defibrillator: Secondary | ICD-10-CM

## 2020-09-16 DIAGNOSIS — G4733 Obstructive sleep apnea (adult) (pediatric): Secondary | ICD-10-CM

## 2020-09-16 NOTE — Progress Notes (Addendum)
OFFICE NOTE  Chief Complaint:  No complaints  Primary Care Physician: Biagio Borg, MD  HPI:  Gar Glance is a 65 y.o. male with a hx of HTN, HL, gout, asthma. He was seen in the emergency room in late March with abdominal pain and vomiting. ECG was abnormal and a fast scan demonstrated no pericardial effusion but global hypokinesis. Plan was to follow-up as an outpatient. However, he presented back to the emergency room with progressive shortness of breath and chest pain with exertion. Admitted 4/5-4/8 with acute systolic HF. EF was noted to be 15-20% on echo. Troponin was minimally elevated (0.14). He was diuresed. LHC was arranged and demonstrated normal coronary arteries. He was dx with NICM. Medications were adjusted for CHF and he returns for FU. Of note, ACE inhibitor was stopped and he was placed on Entresto.   Since DC, he is doing well. His breathing is improved. He is NYHA 2b. He denies orthopnea, PND, edema. He denies chest pain, syncope. Does admit to snoring and does take daytime naps. He has a non-productive cough.    Studies/Reports Reviewed Today:  Echo 09/27/14 - Mild LVH. EF 15% to 20%. Diffuse hypokinesis. There is akinesis of the entireanteroseptal myocardium. Grade 1 diastolic dysfunction. There is muscular trabeculation at apex of left ventricle (no obvious thrombus identified). Definity contrast used. - Mitral valve: There was mild regurgitation. - Left atrium: The atrium was moderately dilated. - Right ventricle: The cavity size was moderately dilated. Wall thickness was normal. - Right atrium: The atrium was mildly dilated.  LHC 09/27/14 The left main coronary artery is normal. The left anterior descending artery is normal. The left circumflex artery is normal. The right coronary artery is dominant and normal. EF: 15%.  Mr. Weninger has an upcoming sleep study which should be helpful hopefully and may be contributing to his  erythrocytosis. I suspect the etiology of his heart failure is hypertensive heart disease. There remains to be seen whether he'll have improvement in LV function. He seems to be tolerating his medications at this point. He does get somewhat fatigued with walking up stairs and doing activities. I suspect a benefit from cardiac rehabilitation.  I saw Mr. Mellin back in the office today. He is here for a limited echo follow-up which was performed on 02/08/2015. Unfortunately this shows the EF to to remain between 10 and 15%. You have his medication shows near optimal therapy as he is currently on aspirin, carvedilol, Lasix, and Entresto 49/51 mg. He seems to be tolerating the medications and has NYHA class II symptoms.  [name] returns today for follow-up. He recently had placement of an AICD for primary prevention of sudden cardiac death by Dr. Caryl Comes in 04-10-2023. This went well and since then he his not had any significant problems. He denies any device firings. He had one episode which was difficult to understand that happened in church but required some reprogramming. Weight is stable at 255 which was his weight in 2023-04-10. He continues to be compliant with CPAP and has an appointment to see Dr. Radford Pax for reevaluation in December. He is also seeing Dr. Caryl Comes back in January. He is currently applying for disability. It should be noted that he has class II symptoms and does get short of breath with moderate exertion and is unable to lift more than 10 pounds routinely. His previous job required more physical work than this.  10/25/2015  Mr. Macomber returns today for follow-up. He's done exceedingly well.  He seems to be tolerating Entresto at the 49/50 one dose. His weight has been stable, in fact it is improved to 230 pounds. He denies any worsening shortness of breath. He currently has class 1-2 symptoms. He's had no AICD firings. He uses CPAP and is been compliant with that. He has a follow-up with  Dr. Radford Pax this summer. He also has an ICD clinic follow-up as well. He is requesting a handicap parking sticker today which I will provide given the significance of his cardiomyopathy.   07/27/2016  Mr. Strout was seen in the office for follow-up today. He continues to do very well. He is completely asymptomatic. Weight is now down further from 230 pounds to 217 pounds. He is on the middle dose of interest oh by hesitant to increase the dose higher due to his blood pressure. He is also on a high dose of carvedilol, spironolactone, aspirin and Lasix. He has had no indication of worsening lead impedance on his AICD remote checks. He denies any shocks. He continues to have class 1-2 symptoms. His last echo in August 2017 showed an EF of 15% without any significant improvement. He remains compliant with CPAP.  02/08/2017  Mr. Stickels returns today for follow-up. He seems to be tolerating Entresto. Recently had an echo which showed improvement in LV function up from 15% to 25-30%. He does have an AICD in place. I've encouraged by the improvement in LV function which may be due to additional medical therapy. He's asymptomatic for most of the time with NYHA class 1-2 symptoms. Blood pressure is stable in the 120s over 70s. EKG shows a sinus rhythm with LVH and repolarization abnormality at 63-personally reviewed. Weight is been stable as well. He denies chest pain or worsening shortness of breath. He is compliant with his CPAP. He has an in office defibrillator check in September.  08/24/2017  Mr. Loman was seen today for follow-up.  He continues to do very well.  He is on high-dose Entresto.  He has had improvement in LV function up to 25-30% as of August 2018.  His AICD is followed by Dr. Caryl Comes and is working appropriately.  His weight has been stable.  He continues to have mostly class I to class II NYHA symptoms.  Blood pressure is at goal.  EKG is unchanged.  He is compliant with CPAP.  03/07/2018  Mr.  Proia is seen today in follow-up.  Overall he is doing well on his medications endorses NYHA class I symptoms.  He did have some interval improvement in LVEF up to 25 to 30% last year.  He has an AICD which has not fired and is seen by Dr. Caryl Comes.  He has an appointment with Dr. Caryl Comes tomorrow.  Blood pressures a goal cholesterol is been at goal.  He reports compliance with CPAP.  Overall he is doing well.  Weight is 3 pounds lighter than his last visit.  09/05/2018  Mr. Fredell is seen today in follow-up.  He continues to do extremely well.  He has NYHA class I symptoms.  EF is somewhere around 25% based on his last echo.  He has a subcutaneous AICD which is not fired and is working appropriately.  He is also on CPAP for obstructive sleep apnea and sees Dr. Radford Pax again this summer.  He is compliant with this and finds that he sleeps well with good energy levels.  Weight is down 5 pounds since his last visit.  His LDL is less than 70  which is at target.  03/18/2020  Mr. Marcil is seen today for follow-up.  He continues to do well.  I am pleased to report his LVEF is further increased up to 35 to 40 percent with continued NYHA class I-II symptoms.  He does have upcoming follow-up with EP for evaluation of his defibrillator.  Apparently he is close to ERI.  Weight has been stable.  He is on max dose Entresto, carvedilol, Aldactone and 20 mg Lasix daily.  EKG shows a sinus bradycardia with incomplete left bundle branch block.  LDL 84 in October 2020.  He continues to work with Dr. Radford Pax for obstructive sleep apnea.  09/16/2020  Mr. Maybee returns today for follow-up.  He continues to do well.  At most he has NYHA class II symptoms.  He has been having close evaluation of his defibrillator which is close to ERI.  He has an upcoming appointment with  EP.  He denies any weight gain or worsening edema.  Previous labs in October showed total cholesterol 139, HDL 39, LDL 86 and triglycerides 68.  A1c of  6.1.  PMHx:  Past Medical History:  Diagnosis Date  . ALLERGIC RHINITIS   . Asthma   . B12 deficiency 04/06/2020  . Cervical radiculitis   . CHF (congestive heart failure) ([place])   . Chronic systolic heart failure ([place]) 02/28/2015  . GERD (gastroesophageal reflux disease)   . GOUT   . Hyperglycemia 08/28/2014  . HYPERLIPIDEMIA   . HYPERTENSION   . Lateral epicondylitis of right elbow   . Nonischemic cardiomyopathy ([place]) 09/28/2014  . Obesity (BMI 30-39.9) 06/06/2015  . S/P cardiac cath wjith normal coronary arteries  09/28/2014  . Sleep apnea   . Thrombocytopenia ([place]) 04/06/2020    Past Surgical History:  Procedure Laterality Date  . COLONOSCOPY    . COLONOSCOPY WITH PROPOFOL N/A 06/17/2017   Procedure: COLONOSCOPY WITH PROPOFOL;  Surgeon: Milus Banister, MD;  Location: [facility] ENDOSCOPY;  Service: Endoscopy;  Laterality: N/A;  . EP IMPLANTABLE DEVICE N/A 03/21/2015   Procedure: SubQ ICD Implant;  Surgeon: Deboraha Sprang, MD;  Location: [facility] CV LAB;  Service: Cardiovascular;  Laterality: N/A;  . LEFT AND RIGHT HEART CATHETERIZATION WITH CORONARY ANGIOGRAM N/A 09/27/2014   Procedure: LEFT AND RIGHT HEART CATHETERIZATION WITH CORONARY ANGIOGRAM;  Surgeon: Belva Crome, MD;  Location: [facility] CATH LAB;  Service: Cardiovascular;  Laterality: N/A;  . SUBQ ICD  03/21/2015    FAMHx:  Family History  Problem Relation Age of Onset  . Lung cancer Mother   . Diabetes Father   . Heart disease Father   . Heart attack Father 4  . Hypertension Sister   . Aneurysm Sister 25       brain aneurysm  . Healthy Brother   . Healthy Brother   . Healthy Brother   . Colon cancer Neg Hx   . Esophageal cancer Neg Hx   . Rectal cancer Neg Hx   . Stomach cancer Neg Hx     SOCHx:   reports that he has never smoked. He has never used smokeless tobacco. He reports that he does not drink alcohol and does not use drugs.  ALLERGIES:  No Known Allergies  ROS: Pertinent items noted in HPI and remainder  of comprehensive ROS otherwise negative.  HOME MEDS: Current Outpatient Medications  Medication Sig Dispense Refill  . carvedilol (COREG) 25 MG tablet TAKE 1 TABLET BY MOUTH  TWICE DAILY WITH A MEAL 180 tablet 3  .  colchicine 0.6 MG tablet TAKE 1 TABLET BY MOUTH  DAILY 90 tablet 3  . ENTRESTO 97-103 MG TAKE 1 TABLET BY MOUTH  TWICE DAILY 180 tablet 3  . furosemide (LASIX) 40 MG tablet TAKE 1 TABLET BY MOUTH  DAILY 90 tablet 3  . pantoprazole (PROTONIX) 40 MG tablet TAKE 1 TABLET BY MOUTH  DAILY 90 tablet 3  . pravastatin (PRAVACHOL) 40 MG tablet TAKE 1 TABLET BY MOUTH IN  THE EVENING 90 tablet 3  . spironolactone (ALDACTONE) 25 MG tablet TAKE ONE-HALF TABLET BY  MOUTH DAILY 45 tablet 3  . vitamin B-12 (CYANOCOBALAMIN) 1000 MCG tablet Take 1 tablet (1,000 mcg total) by mouth daily. 90 tablet 3   No current facility-administered medications for this visit.    LABS/IMAGING: No results found for this or any previous visit (from the past 48 hour(s)). No results found.  WEIGHTS: Wt Readings from Last 3 Encounters:  09/16/20 187 lb 3.2 oz (84.9 kg)  04/08/20 181 lb (82.1 kg)  04/04/20 177 lb (80.3 kg)    VITALS: BP (!) 160/100 (BP Location: Left Arm, Patient Position: Sitting, Cuff Size: Normal)   Pulse 72   Ht 5\' 10"  (1.778 m)   Wt 187 lb 3.2 oz (84.9 kg)   SpO2 96%   BMI 26.86 kg/m   EXAM: General appearance: alert and no distress Neck: no carotid bruit, no JVD and thyroid not enlarged, symmetric, no tenderness/mass/nodules Lungs: clear to auscultation bilaterally Heart: regular rate and rhythm, S1, S2 normal, no murmur, click, rub or gallop and AICD pocket is intact Abdomen: soft, non-tender; bowel sounds normal; no masses,  no organomegaly Extremities: extremities normal, atraumatic, no cyanosis or edema Pulses: 2+ and symmetric Skin: Skin color, texture, turgor normal. No rashes or lesions Neurologic: Grossly normal Psych: Pleasant  EKG: Normal sinus rhythm at 72,  voltage criteria for LVH, inferior T wave changes-personally reviewed  ASSESSMENT: 1. Nonischemic, probably hypertensive cardiomyopathy, EF 15% with normal coronaries-NYHA class I-II symptoms (EF improved to 25 - 02/2018, now 35-40% - 02/2020) 2. Hypertension-controlled 3. Dyslipidemia 4. OSA on CPAP 5. Gout 6. Status post subcutaneous AICD for primary prevention of sudden cardiac death  PLAN: 1.   Mr. Robb still feels well with no more than NYHA class II symptoms.  Blood pressure is elevated today -asked him to take some home readings so we can see if med adjustment is necessary. His cholesterol is at goal.  He is compliant with CPAP.  He is close to ERI for his AICD which is a subcutaneous device.  He has follow-up in the near future with EP to evaluate this.  No changes in his medicines today.  Follow-up with me annually or sooner as necessary.  Pixie Casino, MD, [facility], [place] Director of the [facility] Lip[facility]  Card[facility]ovascular [facility] Diplomate of the American Board of Clinical Lipidology Attending Cardiologist  Direct Dial: [phone]  Fax: [phone]  Website:  [email or web address] 09/16/2020, 4:41 PM

## 2020-09-16 NOTE — Patient Instructions (Signed)

## 2020-09-17 ENCOUNTER — Other Ambulatory Visit (HOSPITAL_COMMUNITY): Payer: Self-pay

## 2020-09-17 DIAGNOSIS — Z20822 Contact with and (suspected) exposure to covid-19: Secondary | ICD-10-CM | POA: Diagnosis not present

## 2020-09-17 DIAGNOSIS — Z03818 Encounter for observation for suspected exposure to other biological agents ruled out: Secondary | ICD-10-CM | POA: Diagnosis not present

## 2020-09-18 ENCOUNTER — Telehealth: Payer: Self-pay | Admitting: Internal Medicine

## 2020-09-18 DIAGNOSIS — I428 Other cardiomyopathies: Secondary | ICD-10-CM

## 2020-09-18 NOTE — Telephone Encounter (Signed)
BP elevated during 09/16/20 visit - advised to check at home Routed to MD

## 2020-09-18 NOTE — Telephone Encounter (Signed)
Patient was calling to give his blood pressure and heart rate results BP: 3/29 148/48 HR: 72        3/30 132/78         73 (this morning)        3/30 133/78         72 (this afternoon)

## 2020-09-19 NOTE — Telephone Encounter (Signed)
Message sent to patient in [place] regarding BP/dose increase

## 2020-09-19 NOTE — Telephone Encounter (Signed)
Would recommend increase spironolactone to 25 mg (full tablet) daily.  Dr Lemmie Evens

## 2020-09-23 MED ORDER — SPIRONOLACTONE 25 MG PO TABS: 25.0000 mg | ORAL_TABLET | Freq: Every day | ORAL | 3 refills | Status: DC

## 2020-09-24 ENCOUNTER — Ambulatory Visit (INDEPENDENT_AMBULATORY_CARE_PROVIDER_SITE_OTHER): Payer: Medicare Other

## 2020-09-24 DIAGNOSIS — I428 Other cardiomyopathies: Secondary | ICD-10-CM | POA: Diagnosis not present

## 2020-09-24 LAB — CUP PACEART REMOTE DEVICE CHECK
Battery Remaining Percentage: 38 %
Date Time Interrogation Session: 20220405072500
Implantable Lead Implant Date: 20160929
Implantable Lead Location: 753862
Implantable Lead Model: 3401
Implantable Pulse Generator Implant Date: 20160929
Pulse Gen Serial Number: 118449

## 2020-09-26 DIAGNOSIS — Z20822 Contact with and (suspected) exposure to covid-19: Secondary | ICD-10-CM | POA: Diagnosis not present

## 2020-09-26 DIAGNOSIS — Z03818 Encounter for observation for suspected exposure to other biological agents ruled out: Secondary | ICD-10-CM | POA: Diagnosis not present

## 2020-10-02 DIAGNOSIS — G4733 Obstructive sleep apnea (adult) (pediatric): Secondary | ICD-10-CM | POA: Diagnosis not present

## 2020-10-03 DIAGNOSIS — Z20822 Contact with and (suspected) exposure to covid-19: Secondary | ICD-10-CM | POA: Diagnosis not present

## 2020-10-03 DIAGNOSIS — Z03818 Encounter for observation for suspected exposure to other biological agents ruled out: Secondary | ICD-10-CM | POA: Diagnosis not present

## 2020-10-04 NOTE — Progress Notes (Signed)
Remote ICD transmission.   

## 2020-10-07 ENCOUNTER — Ambulatory Visit: Payer: Medicare Other

## 2020-10-07 ENCOUNTER — Other Ambulatory Visit: Payer: Self-pay

## 2020-10-07 ENCOUNTER — Ambulatory Visit (INDEPENDENT_AMBULATORY_CARE_PROVIDER_SITE_OTHER): Payer: Medicare Other

## 2020-10-07 VITALS — BP 138/80 | HR 68 | Temp 98.3°F | Ht 70.0 in | Wt 182.8 lb

## 2020-10-07 DIAGNOSIS — Z0000 Encounter for general adult medical examination without abnormal findings: Secondary | ICD-10-CM | POA: Diagnosis not present

## 2020-10-07 NOTE — Progress Notes (Signed)
Subjective:   Matthew Guerrero is a 65 y.o. male who presents for Medicare Annual/Subsequent preventive examination.  Review of Systems    No ROS. Medicare Wellness Visit. Additional risk factors are reflected in social history. Cardiac Risk Factors include: advanced age (>33men, >59 women);dyslipidemia;family history of premature cardiovascular disease;hypertension;male gender   Sleep Patterns: No sleep issues, feels rested on waking and sleeps 8 hours nightly. Home Safety/Smoke Alarms: Feels safe in home; uses home alarm. Smoke alarms in place. Living environment: 1-story home; Lives with roommate; uses a cpap; good support system. Seat Belt Safety/Bike Helmet: Wears seat belt.    Objective:    Today's Vitals   10/07/20 1532  BP: 138/80  Pulse: 68  Temp: 98.3 F (36.8 C)  SpO2: 98%  Weight: 182 lb 12.8 oz (82.9 kg)  Height: 5\' 10"  (1.778 m)  PainSc: 0-No pain   Body mass index is 26.23 kg/m.  Advanced Directives 10/07/2020 10/02/2019 08/18/2018 07/14/2018 06/17/2017 12/14/2016 12/10/2016  Does Patient Have a Medical Advance Directive? Yes Yes No No Yes Yes No  Type of Advance Directive - Healthcare Power of [place];Living will -  Does patient want to make changes to medical advance directive? No - Patient declined No - Patient declined - - - - -  Copy of [place] in Chart? - Yes - validated most recent copy scanned in chart (See row information) - - No - copy requested Yes -  Would patient like information on creating a medical advance directive? - - Yes (ED - Information included in AVS) No - Patient declined - No - Patient declined -    Current Medications (verified) Outpatient Encounter Medications as of 10/07/2020  Medication Sig  . carvedilol (COREG) 25 MG tablet TAKE 1 TABLET BY MOUTH  TWICE DAILY WITH A MEAL  . colchicine 0.6 MG tablet TAKE 1 TABLET BY MOUTH  DAILY  . ENTRESTO 97-103  MG TAKE 1 TABLET BY MOUTH  TWICE DAILY  . furosemide (LASIX) 40 MG tablet TAKE 1 TABLET BY MOUTH  DAILY  . pantoprazole (PROTONIX) 40 MG tablet TAKE 1 TABLET BY MOUTH  DAILY  . pravastatin (PRAVACHOL) 40 MG tablet TAKE 1 TABLET BY MOUTH IN  THE EVENING  . spironolactone (ALDACTONE) 25 MG tablet Take 1 tablet (25 mg total) by mouth daily.  . vitamin B-12 (CYANOCOBALAMIN) 1000 MCG tablet Take 1 tablet (1,000 mcg total) by mouth daily.   No facility-administered encounter medications on file as of 10/07/2020.    Allergies (verified) Patient has no known allergies.   History: Past Medical History:  Diagnosis Date  . ALLERGIC RHINITIS   . Asthma   . B12 deficiency 04/06/2020  . Cervical radiculitis   . CHF (congestive heart failure) ([place])   . Chronic systolic heart failure ([place]) 02/28/2015  . GERD (gastroesophageal reflux disease)   . GOUT   . Hyperglycemia 08/28/2014  . HYPERLIPIDEMIA   . HYPERTENSION   . Lateral epicondylitis of right elbow   . Nonischemic cardiomyopathy ([place]) 09/28/2014  . Obesity (BMI 30-39.9) 06/06/2015  . S/P cardiac cath wjith normal coronary arteries  09/28/2014  . Sleep apnea   . Thrombocytopenia ([place]) 04/06/2020   Past Surgical History:  Procedure Laterality Date  . COLONOSCOPY    . COLONOSCOPY WITH PROPOFOL N/A 06/17/2017   Procedure: COLONOSCOPY WITH PROPOFOL;  Surgeon: Milus Banister, MD;  Location: [facility] ENDOSCOPY;  Service: Endoscopy;  Laterality: N/A;  .  EP IMPLANTABLE DEVICE N/A 03/21/2015   Procedure: SubQ ICD Implant;  Surgeon: Deboraha Sprang, MD;  Location: [facility];  Service: Cardiovascular;  Laterality: N/A;  . LEFT AND RIGHT HEART CATHETERIZATION WITH CORONARY ANGIOGRAM N/A 09/27/2014   Procedure: LEFT AND RIGHT HEART CATHETERIZATION WITH CORONARY ANGIOGRAM;  Surgeon: Belva Crome, MD;  Location: [facility] CATH LAB;  Service: Cardiovascular;  Laterality: N/A;  . SUBQ ICD  03/21/2015   Family History  Problem Relation Age of Onset  . Lung cancer  Mother   . Diabetes Father   . Heart disease Father   . Heart attack Father 70  . Hypertension Sister   . Aneurysm Sister 4       brain aneurysm  . Healthy Brother   . Healthy Brother   . Healthy Brother   . Colon cancer Neg Hx   . Esophageal cancer Neg Hx   . Rectal cancer Neg Hx   . Stomach cancer Neg Hx    Social History   Socioeconomic History  . Marital status: Single    Spouse name: Not on file  . Number of children: Not on file  . Years of education: Not on file  . Highest education level: Not on file  Occupational History  . Occupation: retired  Tobacco Use  . Smoking status: Never Smoker  . Smokeless tobacco: Never Used  Vaping Use  . Vaping Use: Never used  Substance and Sexual Activity  . Alcohol use: No    Alcohol/week: 0.0 standard drinks  . Drug use: No  . Sexual activity: Not Currently  Other Topics Concern  . Not on file  Social History Narrative  . Not on file   Social Determinants of Health   Financial Resource Strain: Low Risk   . Difficulty of Paying Living Expenses: Not hard at all  Food Insecurity: No Food Insecurity  . Worried About Charity fundraiser in the Last Year: Never true  . Ran Out of Food in the Last Year: Never true  Transportation Needs: No Transportation Needs  . Lack of Transportation (Medical): No  . Lack of Transportation (Non-Medical): No  Physical Activity: Sufficiently Active  . Days of Exercise per Week: 5 days  . Minutes of Exercise per Session: 30 min  Stress: No Stress Concern Present  . Feeling of Stress : Not at all  Social Connections: Moderately Integrated  . Frequency of Communication with Friends and Family: More than three times a week  . Frequency of Social Gatherings with Friends and Family: Once a week  . Attends Religious Services: More than 4 times per year  . Active Member of Clubs or Organizations: Yes  . Attends Archivist Meetings: More than 4 times per year  . Marital Status: Never  married    Tobacco Counseling Counseling given: Not Answered   Clinical Intake:  Pre-visit preparation completed: Yes  Pain : No/denies pain Pain Score: 0-No pain     BMI - recorded: 26.23 Nutritional Status: BMI 25 -29 Overweight Nutritional Risks: None Diabetes: No  How often do you need to have someone help you when you read instructions, pamphlets, or other written materials from your doctor or pharmacy?: 1 - Never What is the last grade level you completed in school?: GED  Diabetic? no  Interpreter Needed?: No  Information entered by :: Lisette Abu, LPN   Activities of Daily Living In your present state of health, do you have any difficulty performing the following  activities: 10/07/2020  Hearing? N  Vision? N  Difficulty concentrating or making decisions? N  Walking or climbing stairs? N  Dressing or bathing? N  Doing errands, shopping? N  Preparing Food and eating ? N  Using the Toilet? N  In the past six months, have you accidently leaked urine? N  Do you have problems with loss of bowel control? N  Managing your Medications? N  Managing your Finances? N  Housekeeping or managing your Housekeeping? N  Some recent data might be hidden    Patient Care Team: Biagio Borg, MD as PCP - General (Internal Medicine) Debara Pickett Nadean Corwin, MD as PCP - Cardiology (Cardiology) Sueanne Margarita, MD as PCP - Sleep Medicine (Cardiology)  Indicate any recent Medical Services you may have received from other than [facility] providers in the past year (date may be approximate).     Assessment:   This is a routine wellness examination for [place].  Hearing/Vision screen No exam data present  Dietary issues and exercise activities discussed: Current Exercise Habits: Home exercise routine, Type of exercise: walking, Time (Minutes): 30, Frequency (Times/Week): 5, Weekly Exercise (Minutes/Week): 150, Intensity: Moderate, Exercise limited by: cardiac condition(s)  Goals     . Client understands the importance of follow-up with providers by attending scheduled visits    . Patient Stated     Maintain current health status.    . Patient Stated     To continue to stay alive, be active and stay covid free.      Depression Screen PHQ 2/9 Scores 10/07/2020 04/04/2020 10/02/2019 08/18/2018 03/29/2018 12/14/2016 10/21/2016  PHQ - 2 Score 0 0 0 0 0 0 0    Fall Risk Fall Risk  10/07/2020 04/04/2020 10/02/2019 08/18/2018 03/29/2018  Falls in the past year? 0 0 0 0 No  Number falls in past yr: 0 0 0 0 -  Injury with Fall? 0 0 0 - -  Risk for fall due to : No Fall Risks - No Fall Risks - -  Follow up Falls evaluation completed - Falls evaluation completed;Education provided;Falls prevention discussed - -    FALL RISK PREVENTION PERTAINING TO THE HOME:  Any stairs in or around the home? No  If so, are there any without handrails? No  Home free of loose throw rugs in walkways, pet beds, electrical cords, etc? Yes  Adequate lighting in your home to reduce risk of falls? Yes   ASSISTIVE DEVICES UTILIZED TO PREVENT FALLS:  Life alert? No  Use of a cane, walker or w/c? No  Grab bars in the bathroom? Yes  Shower chair or bench in shower? No  Elevated toilet seat or a handicapped toilet? Yes   TIMED UP AND GO:  Was the test performed? No .  Length of time to ambulate 10 feet: 0 sec.   Gait steady and fast without use of assistive device  Cognitive Function: Normal cognitive status assessed by direct observation by this Nurse Health Advisor. No abnormalities found.       6CIT Screen 10/02/2019  What Year? 0 points  What month? 0 points  What time? 0 points  Count back from 20 0 points  Months in reverse 0 points  Repeat phrase 0 points  Total Score 0    Immunizations Immunization History  Administered Date(s) Administered  . Fluad Quad(high Dose 65+) 04/04/2020  . Influenza Split 04/28/2012  . Influenza Whole 03/22/2009, 04/18/2010  . Influenza,inj,Quad  PF,6+ Mos 02/25/2014, 02/28/2015, 03/29/2018, 03/09/2019  .  Influenza-Unspecified 02/10/2017, 03/29/2018, 03/09/2019  . Moderna Sars-Covid-2 Vaccination 09/08/2019, 10/09/2019, 04/23/2020  . Pneumococcal Conjugate-13 04/04/2020  . Pneumococcal Polysaccharide-23 02/28/2015  . Td 07/01/2009  . Zoster Recombinat (Shingrix) 03/22/2016, 05/22/2016    TDAP status: Due, Education has been provided regarding the importance of this vaccine. Advised may receive this vaccine at local pharmacy or Health Dept. Aware to provide a copy of the vaccination record if obtained from local pharmacy or Health Dept. Verbalized acceptance and understanding.  Flu Vaccine status: Up to date  Pneumococcal vaccine status: Up to date  Covid-19 vaccine status: Completed vaccines  Qualifies for Shingles Vaccine? Yes   Zostavax completed Yes   Shingrix Completed?: No.    Education has been provided regarding the importance of this vaccine. Patient has been advised to call insurance company to determine out of pocket expense if they have not yet received this vaccine. Advised may also receive vaccine at local pharmacy or Health Dept. Verbalized acceptance and understanding.  Screening Tests Health Maintenance  Topic Date Due  . TETANUS/TDAP  04/04/2021 (Originally 07/02/2019)  . INFLUENZA VACCINE  01/20/2021  . PNA vac Low Risk Adult (2 of 2 - PPSV23) 04/04/2021  . COLONOSCOPY (Pts 45-7yrs Insurance coverage will need to be confirmed)  06/17/2022  . COVID-19 Vaccine  Completed  . Hepatitis C Screening  Completed  . HPV VACCINES  Aged Out    Health Maintenance  There are no preventive care reminders to display for this patient.  Colorectal cancer screening: Type of screening: Colonoscopy. Completed 06/17/2017. Repeat every 5 years  Lung Cancer Screening: (Low Dose CT Chest recommended if Age 79-80 years, 30 pack-year currently smoking OR have quit w/in 15years.) does qualify.   Lung Cancer Screening Referral:  no  Additional Screening:  Hepatitis C Screening: does qualify; Completed no  Vision Screening: Recommended annual ophthalmology exams for early detection of glaucoma and other disorders of the eye. Is the patient up to date with their annual eye exam?  Yes  Who is the provider or what is the name of the office in which the patient attends annual eye exams? Marica Otter, OD. If pt is not established with a provider, would they like to be referred to a provider to establish care? No .   Dental Screening: Recommended annual dental exams for proper oral hygiene  Community Resource Referral / Chronic Care Management: CRR required this visit?  No   CCM required this visit?  No      Plan:     I have personally reviewed and noted the following in the patient's chart:   . Medical and social history . Use of alcohol, tobacco or illicit drugs  . Current medications and supplements . Functional ability and status . Nutritional status . Physical activity . Advanced directives . List of other physicians . Hospitalizations, surgeries, and ER visits in previous 12 months . Vitals . Screenings to include cognitive, depression, and falls . Referrals and appointments  In addition, I have reviewed and discussed with patient certain preventive protocols, quality metrics, and best practice recommendations. A written personalized care plan for preventive services as well as general preventive health recommendations were provided to patient.     Sheral Flow, LPN   07/10/4172   Nurse Notes:  Medications reviewed with patient; no opioid use noted.

## 2020-10-07 NOTE — Patient Instructions (Addendum)
Matthew Guerrero , Thank you for taking time to come for your Medicare Wellness Visit. I appreciate your ongoing commitment to your health goals. Please review the following plan we discussed and let me know if I can assist you in the future.   Screening recommendations/referrals: Colonoscopy: 06/17/2017; due every 5 years (05/2022) Recommended yearly ophthalmology/optometry visit for glaucoma screening and checkup Recommended yearly dental visit for hygiene and checkup  Vaccinations: Influenza vaccine: 04/04/2020 Pneumococcal vaccine: 02/28/2015, 04/04/2020 Tdap vaccine: overdue; due at next appt with pcp Shingles vaccine:  Please call your insurance company to determine your out of pocket expense for the Shingrix vaccine. You may receive this vaccine at your local pharmacy.  Covid-19: Moderna 07/13/2019, 08/04/2019, 04/23/2020  Advanced directives: Documents on file.  Conditions/risks identified: Yes; Reviewed health maintenance screenings with patient today and relevant education, vaccines, and/or referrals were provided. Please continue to do your personal lifestyle choices by: daily care of teeth and gums, regular physical activity (goal should be 5 days a week for 30 minutes), eat a healthy diet, avoid tobacco and drug use, limiting any alcohol intake, taking a low-dose aspirin (if not allergic or have been advised by your provider otherwise) and taking vitamins and minerals as recommended by your provider. Continue doing brain stimulating activities (puzzles, reading, adult coloring books, staying active) to keep memory sharp. Continue to eat heart healthy diet (full of fruits, vegetables, whole grains, lean protein, water--limit salt, fat, and sugar intake) and increase physical activity as tolerated.  Next appointment: Please schedule your next Medicare Wellness Visit with your Nurse Health Advisor in 1 year by calling [phone].  Preventive Care 66 Years and Older, Male Preventive care  refers to lifestyle choices and visits with your health care provider that can promote health and wellness. What does preventive care include?  A yearly physical exam. This is also called an annual well check.  Dental exams once or twice a year.  Routine eye exams. Ask your health care provider how often you should have your eyes checked.  Personal lifestyle choices, including:  Daily care of your teeth and gums.  Regular physical activity.  Eating a healthy diet.  Avoiding tobacco and drug use.  Limiting alcohol use.  Practicing safe sex.  Taking low doses of aspirin every day.  Taking vitamin and mineral supplements as recommended by your health care provider. What happens during an annual well check? The services and screenings done by your health care provider during your annual well check will depend on your age, overall health, lifestyle risk factors, and family history of disease. Counseling  Your health care provider may ask you questions about your:  Alcohol use.  Tobacco use.  Drug use.  Emotional well-being.  Home and relationship well-being.  Sexual activity.  Eating habits.  History of falls.  Memory and ability to understand (cognition).  Work and work Statistician. Screening  You may have the following tests or measurements:  Height, weight, and BMI.  Blood pressure.  Lipid and cholesterol levels. These may be checked every 5 years, or more frequently if you are over 42 years old.  Skin check.  Lung cancer screening. You may have this screening every year starting at age 69 if you have a 30-pack-year history of smoking and currently smoke or have quit within the past 15 years.  Fecal occult blood test (FOBT) of the stool. You may have this test every year starting at age 66.  Flexible sigmoidoscopy or colonoscopy. You may have a sigmoidoscopy  every 5 years or a colonoscopy every 10 years starting at age 66.  Prostate cancer screening.  Recommendations will vary depending on your family history and other risks.  Hepatitis C blood test.  Hepatitis B blood test.  Sexually transmitted disease (STD) testing.  Diabetes screening. This is done by checking your blood sugar (glucose) after you have not eaten for a while (fasting). You may have this done every 1-3 years.  Abdominal aortic aneurysm (AAA) screening. You may need this if you are a current or former smoker.  Osteoporosis. You may be screened starting at age 66 if you are at high risk. Talk with your health care provider about your test results, treatment options, and if necessary, the need for more tests. Vaccines  Your health care provider may recommend certain vaccines, such as:  Influenza vaccine. This is recommended every year.  Tetanus, diphtheria, and acellular pertussis (Tdap, Td) vaccine. You may need a Td booster every 10 years.  Zoster vaccine. You may need this after age 66.  Pneumococcal 13-valent conjugate (PCV13) vaccine. One dose is recommended after age 66.  Pneumococcal polysaccharide (PPSV23) vaccine. One dose is recommended after age 66. Talk to your health care provider about which screenings and vaccines you need and how often you need them. This information is not intended to replace advice given to you by your health care provider. Make sure you discuss any questions you have with your health care provider. Document Released: 07/05/2015 Document Revised: 02/26/2016 Document Reviewed: 04/09/2015 Elsevier Interactive Patient Education  2017 [employer] Prevention in the Home Falls can cause injuries. They can happen to people of all ages. There are many things you can do to make your home safe and to help prevent falls. What can I do on the outside of my home?  Regularly fix the edges of walkways and driveways and fix any cracks.  Remove anything that might make you trip as you walk through a door, such as a raised step or  threshold.  Trim any bushes or trees on the path to your home.  Use bright outdoor lighting.  Clear any walking paths of anything that might make someone trip, such as rocks or tools.  Regularly check to see if handrails are loose or broken. Make sure that both sides of any steps have handrails.  Any raised decks and porches should have guardrails on the edges.  Have any leaves, snow, or ice cleared regularly.  Use sand or salt on walking paths during winter.  Clean up any spills in your garage right away. This includes oil or grease spills. What can I do in the bathroom?  Use night lights.  Install grab bars by the toilet and in the tub and shower. Do not use towel bars as grab bars.  Use non-skid mats or decals in the tub or shower.  If you need to sit down in the shower, use a plastic, non-slip stool.  Keep the floor dry. Clean up any water that spills on the floor as soon as it happens.  Remove soap buildup in the tub or shower regularly.  Attach bath mats securely with double-sided non-slip rug tape.  Do not have throw rugs and other things on the floor that can make you trip. What can I do in the bedroom?  Use night lights.  Make sure that you have a light by your bed that is easy to reach.  Do not use any sheets or blankets that are too  big for your bed. They should not hang down onto the floor.  Have a firm chair that has side arms. You can use this for support while you get dressed.  Do not have throw rugs and other things on the floor that can make you trip. What can I do in the kitchen?  Clean up any spills right away.  Avoid walking on wet floors.  Keep items that you use a lot in easy-to-reach places.  If you need to reach something above you, use a strong step stool that has a grab bar.  Keep electrical cords out of the way.  Do not use floor polish or wax that makes floors slippery. If you must use wax, use non-skid floor wax.  Do not have  throw rugs and other things on the floor that can make you trip. What can I do with my stairs?  Do not leave any items on the stairs.  Make sure that there are handrails on both sides of the stairs and use them. Fix handrails that are broken or loose. Make sure that handrails are as long as the stairways.  Check any carpeting to make sure that it is firmly attached to the stairs. Fix any carpet that is loose or worn.  Avoid having throw rugs at the top or bottom of the stairs. If you do have throw rugs, attach them to the floor with carpet tape.  Make sure that you have a light switch at the top of the stairs and the bottom of the stairs. If you do not have them, ask someone to add them for you. What else can I do to help prevent falls?  Wear shoes that:  Do not have high heels.  Have rubber bottoms.  Are comfortable and fit you well.  Are closed at the toe. Do not wear sandals.  If you use a stepladder:  Make sure that it is fully opened. Do not climb a closed stepladder.  Make sure that both sides of the stepladder are locked into place.  Ask someone to hold it for you, if possible.  Clearly mark and make sure that you can see:  Any grab bars or handrails.  First and last steps.  Where the edge of each step is.  Use tools that help you move around (mobility aids) if they are needed. These include:  Canes.  Walkers.  Scooters.  Crutches.  Turn on the lights when you go into a dark area. Replace any light bulbs as soon as they burn out.  Set up your furniture so you have a clear path. Avoid moving your furniture around.  If any of your floors are uneven, fix them.  If there are any pets around you, be aware of where they are.  Review your medicines with your doctor. Some medicines can make you feel dizzy. This can increase your chance of falling. Ask your doctor what other things that you can do to help prevent falls. This information is not intended to  replace advice given to you by your health care provider. Make sure you discuss any questions you have with your health care provider. Document Released: 04/04/2009 Document Revised: 11/14/2015 Document Reviewed: 07/13/2014 Elsevier Interactive Patient Education  2017 [employer].

## 2020-10-08 NOTE — Progress Notes (Signed)
Cardiology Office Note Date:  10/08/2020  Patient ID:  Matthew, Guerrero September 10, 1955, MRN [id number] PCP:  Biagio Borg, MD  Cardiologist:  Dr. Debara Pickett Electrophysiologist: Dr. Caryl Comes OSA: Dr. Radford Pax   Chief Complaint:   40mo follow up  History of Present Illness: Matthew Guerrero is a 65 y.o. male with history of HTN, HLD, gout, asthma, NICM, S-ICD, OSA w/CPAP, chronic CHF (systolic)  The patient comes in today to be seen for Dr. Caryl Comes.  He last saw hi in Sept 2019, doing well. Dr. Caryl Comes makes note that he had TWOS in the secondary vector and programmed in the alternate.    I saw him Oct 2020,  he felt very well.  Denied any CP, palpitations or SOB, no DOE or exertional intolerances, no symptoms of PND or orthopnea.  No near syncope or syncope, no shocks. His weight had fluctuated and he mentions that he he had taken a detoxify regime/pills a few months back that he lost several pounds with and then another pill that apparently was to reset everything back and regain his weight but in the right way. I cautioned him on taking such regimes without MD guidance and clearance. His device interrogation was normal, 54% battery used  I saw him 08/02/19 He feels quite well.  No CP, palpitations or exertional incapacities.  He denies any rest or exertional SOB, no symptoms of PND or orthopnea. No dizzy spells, near syncope or syncope. He weighs daily and reports stable weight He is tolerating his medicines, reports Delene Loll is covered well for him He was advised of device advisory, battery life looked OK at 50%, alert tone was demonstrated No changes were made.  He saw Dr. Debara Pickett 03/18/2020, was doing well, on GDMT, following as well with Dr. Radford Pax for his OSA.  Felt to have class I-II symptoms, and despite some improvement in his LVEF when gen change needed to be done, recommended proceeding with another ICD, given likelihood of his EF waxing/waning.  I saw him Oct 2021 He continues to  Feel  quite well.  No CP, palpitations or SOB.  Denies DOE, remains active around his house, yard work, errands.  No symptoms of PND or orthopnea, no dizzy spells, near syncope or syncope. His PMD monitors his labs regularly No shocks No changes were made, battery behavior was stable  He saw Dr. Debara Pickett in March 2022, doing well, class II symptoms, no changes were made.  TODAY He continues to do really well No CP, palpitations or cardiac awareness No SOB No dizzy spells, near syncope or syncope. He has not heard any alert tones from his device     Device information BSCi S-ICD, implanted 03/21/15, Dr. Caryl Comes Device lead sensing configuration is on alternate 2/2 TWOS Implanted for primary prevention I do not see h/o of any appropriate therapies Device is on early battery depletion advisory He is enrolled in [employer] home monitoring  Past Medical History:  Diagnosis Date  . ALLERGIC RHINITIS   . Asthma   . B12 deficiency 04/06/2020  . Cervical radiculitis   . CHF (congestive heart failure) ([place])   . Chronic systolic heart failure ([place]) 02/28/2015  . GERD (gastroesophageal reflux disease)   . GOUT   . Hyperglycemia 08/28/2014  . HYPERLIPIDEMIA   . HYPERTENSION   . Lateral epicondylitis of right elbow   . Nonischemic cardiomyopathy ([place]) 09/28/2014  . Obesity (BMI 30-39.9) 06/06/2015  . S/P cardiac cath wjith normal coronary arteries  09/28/2014  . Sleep apnea   .  Thrombocytopenia ([place]) 04/06/2020    Past Surgical History:  Procedure Laterality Date  . COLONOSCOPY    . COLONOSCOPY WITH PROPOFOL N/A 06/17/2017   Procedure: COLONOSCOPY WITH PROPOFOL;  Surgeon: Milus Banister, MD;  Location: [facility];  Service: Endoscopy;  Laterality: N/A;  . EP IMPLANTABLE DEVICE N/A 03/21/2015   Procedure: SubQ ICD Implant;  Surgeon: Deboraha Sprang, MD;  Location: [facility];  Service: Cardiovascular;  Laterality: N/A;  . LEFT AND RIGHT HEART CATHETERIZATION WITH CORONARY ANGIOGRAM N/A  09/27/2014   Procedure: LEFT AND RIGHT HEART CATHETERIZATION WITH CORONARY ANGIOGRAM;  Surgeon: Belva Crome, MD;  Location: [facility] CATH LAB;  Service: Cardiovascular;  Laterality: N/A;  . SUBQ ICD  03/21/2015    Current Outpatient Medications  Medication Sig Dispense Refill  . carvedilol (COREG) 25 MG tablet TAKE 1 TABLET BY MOUTH  TWICE DAILY WITH A MEAL 180 tablet 3  . colchicine 0.6 MG tablet TAKE 1 TABLET BY MOUTH  DAILY 90 tablet 3  . ENTRESTO 97-103 MG TAKE 1 TABLET BY MOUTH  TWICE DAILY 180 tablet 3  . furosemide (LASIX) 40 MG tablet TAKE 1 TABLET BY MOUTH  DAILY 90 tablet 3  . pantoprazole (PROTONIX) 40 MG tablet TAKE 1 TABLET BY MOUTH  DAILY 90 tablet 3  . pravastatin (PRAVACHOL) 40 MG tablet TAKE 1 TABLET BY MOUTH IN  THE EVENING 90 tablet 3  . spironolactone (ALDACTONE) 25 MG tablet Take 1 tablet (25 mg total) by mouth daily. 90 tablet 3  . vitamin B-12 (CYANOCOBALAMIN) 1000 MCG tablet Take 1 tablet (1,000 mcg total) by mouth daily. 90 tablet 3   No current facility-administered medications for this visit.    Allergies:   Patient has no known allergies.   Social History:  The patient  reports that he has never smoked. He has never used smokeless tobacco. He reports that he does not drink alcohol and does not use drugs.   Family History:  The patient's family history includes Aneurysm (age of onset: 70) in his sister; Diabetes in his father; Healthy in his brother, brother, and brother; Heart attack (age of onset: 49) in his father; Heart disease in his father; Hypertension in his sister; Lung cancer in his mother.  ROS:  Please see the history of present illness.  All other systems are reviewed and otherwise negative.   PHYSICAL EXAM:  VS:  There were no vitals taken for this visit. BMI: There is no height or weight on file to calculate BMI. Well nourished, well developed, in no acute distress  HEENT: normocephalic, atraumatic  Neck: no JVD, carotid bruits or masses Cardiac:   RRR; no significant murmurs, no rubs, or gallops Lungs: CTA b/l, no wheezing, rhonchi or rales  Abd: soft, nontender MS: no deformity or atrophy Ext:     no edema  Skin: warm and dry, no rash Neuro:  No gross deficits appreciated Psych: euthymic mood, full affect  S-ICD site is stable, no tethering or discomfort   EKG:  Not done today  ICD interrogation done today and reviewed by myself:  Battery is at 37% No arrhythmias or therapies electrode impedance is 65 Ohms  Oct 2021 Batt 42% Feb 2021: 50% Jan 2021 battery 52% Oct 2020 battery 54%   03/05/2020 TTE IMPRESSIONS  1. Left ventricular ejection fraction, by estimation, is 35 to 40%. The  left ventricle has moderately decreased function. The left ventricle  demonstrates global hypokinesis. Left ventricular diastolic parameters are  consistent with  Grade I diastolic  dysfunction (impaired relaxation).  2. Right ventricular systolic function is normal. The right ventricular  size is normal. Tricuspid regurgitation signal is inadequate for assessing  PA pressure.  3. The mitral valve is grossly normal. Trivial mitral valve  regurgitation. No evidence of mitral stenosis.  4. The aortic valve is tricuspid. Aortic valve regurgitation is trivial.  No aortic stenosis is present.  5. The inferior vena cava is normal in size with greater than 50%  respiratory variability, suggesting right atrial pressure of 3 mmHg.   Comparison(s): Changes from prior study are noted. EF has improved to  35-40%. LV dimensions within normal limits.    03/14/18: TTE Study Conclusions - Left ventricle: The cavity size was severely dilated. Wall   thickness was normal. The estimated ejection fraction was 25%.   Diffuse hypokinesis. - Atrial septum: No defect or patent foramen ovale was identified.    Recent Labs: 04/05/2020: ALT 13; BUN 14; Creatinine, Ser 1.06; Hemoglobin 15.1; Platelets 120.0; Potassium 4.4; Sodium 138; TSH 0.42   04/05/2020: Cholesterol 139; HDL 39.40; LDL Cholesterol 86; Total CHOL/HDL Ratio 4; Triglycerides 68.0; VLDL 13.6   CrCl cannot be calculated (Patient's most recent lab result is older than the maximum 21 days allowed.).   Wt Readings from Last 3 Encounters:  10/07/20 182 lb 12.8 oz (82.9 kg)  09/16/20 187 lb 3.2 oz (84.9 kg)  04/08/20 181 lb (82.1 kg)     Other studies reviewed: Additional studies/records reviewed today include: summarized above  ASSESSMENT AND PLAN:  1. S-ICD     As above, no acute changes in battery depletion      Alert tone was demonstrated at a prior visit     Re-discussed importance remote monitoring, he is enrolled in [employer] and doing remotes       2. NICM 3. Chronic CHF (systolic)     On BB, entresto, diuretics     No symptoms or exam findings to suggest fluid OL     Follows with Dr. Debara Pickett     BMET today       4. HTN    Unusually high for him, had been on the phoine with his insurance company about an up paid bill, thinks this was why    Disposition: remotes as usual and in clinic in 25mo, sooner if needed    Current medicines are reviewed at length with the patient today.  The patient did not have any concerns regarding medicines.  Venetia Night, PA-C 10/08/2020 11:31 AM     [place] [address] [phone] (office)  [phone] (fax)

## 2020-10-09 ENCOUNTER — Other Ambulatory Visit: Payer: Self-pay

## 2020-10-09 ENCOUNTER — Ambulatory Visit (INDEPENDENT_AMBULATORY_CARE_PROVIDER_SITE_OTHER): Payer: Medicare Other | Admitting: Physician Assistant

## 2020-10-09 ENCOUNTER — Encounter: Payer: Self-pay | Admitting: Physician Assistant

## 2020-10-09 VITALS — BP 150/78 | HR 82 | Ht 70.0 in | Wt 185.4 lb

## 2020-10-09 DIAGNOSIS — Z95810 Presence of automatic (implantable) cardiac defibrillator: Secondary | ICD-10-CM | POA: Diagnosis not present

## 2020-10-09 DIAGNOSIS — I5022 Chronic systolic (congestive) heart failure: Secondary | ICD-10-CM | POA: Diagnosis not present

## 2020-10-09 DIAGNOSIS — Z79899 Other long term (current) drug therapy: Secondary | ICD-10-CM

## 2020-10-09 DIAGNOSIS — I428 Other cardiomyopathies: Secondary | ICD-10-CM

## 2020-10-09 DIAGNOSIS — I10 Essential (primary) hypertension: Secondary | ICD-10-CM | POA: Diagnosis not present

## 2020-10-09 LAB — CUP PACEART INCLINIC DEVICE CHECK
Date Time Interrogation Session: 20220420155404
Implantable Lead Implant Date: 20160929
Implantable Lead Location: 753862
Implantable Lead Model: 3401
Implantable Pulse Generator Implant Date: 20160929
Pulse Gen Serial Number: 118449

## 2020-10-09 NOTE — Patient Instructions (Signed)
Medication Instructions:   Your physician recommends that you continue on your current medications as directed. Please refer to the Current Medication list given to you today.   *If you need a refill on your cardiac medications before your next appointment, please call your pharmacy*   Lab Work: BMET TODAY    If you have labs (blood work) drawn today and your tests are completely normal, you will receive your results only by: [name] MyChart Message (if you have MyChart) OR . A paper copy in the mail If you have any lab test that is abnormal or we need to change your treatment, we will call you to review the results.   Testing/Procedures: NONE ORDERED  TODAY    Follow-Up: At [facility], you and your health needs are our priority.  As part of our continuing mission to provide you with exceptional heart care, we have created designated Provider Care Teams.  These Care Teams include your primary Cardiologist (physician) and Advanced Practice Providers (APPs -  Physician Assistants and Nurse Practitioners) who all work together to provide you with the care you need, when you need it.  We recommend signing up for the patient portal called "MyChart".  Sign up information is provided on this After Visit Summary.  MyChart is used to connect with patients for Virtual Visits (Telemedicine).  Patients are able to view lab/test results, encounter notes, upcoming appointments, etc.  Non-urgent messages can be sent to your provider as well.   To learn more about what you can do with MyChart, go to [email or web address].    Your next appointment:   6 month(s)  The format for your next appointment:   In Person  Provider:   You may see Dr. Caryl Comes or one of the following Advanced Practice Providers on your designated Care Team:    Chanetta Marshall, NP  Tommye Standard, PA-C  Legrand Como "Oda Kilts, [place]    Other Instructions

## 2020-10-10 LAB — BASIC METABOLIC PANEL
BUN/Creatinine Ratio: 11 (ref 10–24)
BUN: 13 mg/dL (ref 8–27)
CO2: 22 mmol/L (ref 20–29)
Calcium: 9.7 mg/dL (ref 8.6–10.2)
Chloride: 103 mmol/L (ref 96–106)
Creatinine, Ser: 1.14 mg/dL (ref 0.76–1.27)
Glucose: 99 mg/dL (ref 65–99)
Potassium: 4.1 mmol/L (ref 3.5–5.2)
Sodium: 142 mmol/L (ref 134–144)
eGFR: 71 mL/min/{1.73_m2} (ref >59)

## 2020-10-11 DIAGNOSIS — Z03818 Encounter for observation for suspected exposure to other biological agents ruled out: Secondary | ICD-10-CM | POA: Diagnosis not present

## 2020-10-11 DIAGNOSIS — Z20822 Contact with and (suspected) exposure to covid-19: Secondary | ICD-10-CM | POA: Diagnosis not present

## 2020-10-17 DIAGNOSIS — Z20822 Contact with and (suspected) exposure to covid-19: Secondary | ICD-10-CM | POA: Diagnosis not present

## 2020-10-25 DIAGNOSIS — Z03818 Encounter for observation for suspected exposure to other biological agents ruled out: Secondary | ICD-10-CM | POA: Diagnosis not present

## 2020-10-25 DIAGNOSIS — Z20822 Contact with and (suspected) exposure to covid-19: Secondary | ICD-10-CM | POA: Diagnosis not present

## 2020-10-31 DIAGNOSIS — Z20822 Contact with and (suspected) exposure to covid-19: Secondary | ICD-10-CM | POA: Diagnosis not present

## 2020-10-31 DIAGNOSIS — Z03818 Encounter for observation for suspected exposure to other biological agents ruled out: Secondary | ICD-10-CM | POA: Diagnosis not present

## 2020-11-07 DIAGNOSIS — Z03818 Encounter for observation for suspected exposure to other biological agents ruled out: Secondary | ICD-10-CM | POA: Diagnosis not present

## 2020-11-07 DIAGNOSIS — Z20822 Contact with and (suspected) exposure to covid-19: Secondary | ICD-10-CM | POA: Diagnosis not present

## 2020-11-13 DIAGNOSIS — Z20822 Contact with and (suspected) exposure to covid-19: Secondary | ICD-10-CM | POA: Diagnosis not present

## 2020-11-13 DIAGNOSIS — Z03818 Encounter for observation for suspected exposure to other biological agents ruled out: Secondary | ICD-10-CM | POA: Diagnosis not present

## 2020-11-21 DIAGNOSIS — Z03818 Encounter for observation for suspected exposure to other biological agents ruled out: Secondary | ICD-10-CM | POA: Diagnosis not present

## 2020-11-21 DIAGNOSIS — Z20822 Contact with and (suspected) exposure to covid-19: Secondary | ICD-10-CM | POA: Diagnosis not present

## 2020-11-27 DIAGNOSIS — Z03818 Encounter for observation for suspected exposure to other biological agents ruled out: Secondary | ICD-10-CM | POA: Diagnosis not present

## 2020-11-27 DIAGNOSIS — Z20822 Contact with and (suspected) exposure to covid-19: Secondary | ICD-10-CM | POA: Diagnosis not present

## 2020-12-05 DIAGNOSIS — Z20822 Contact with and (suspected) exposure to covid-19: Secondary | ICD-10-CM | POA: Diagnosis not present

## 2020-12-05 DIAGNOSIS — Z03818 Encounter for observation for suspected exposure to other biological agents ruled out: Secondary | ICD-10-CM | POA: Diagnosis not present

## 2020-12-10 ENCOUNTER — Telehealth: Payer: Self-pay | Admitting: Internal Medicine

## 2020-12-10 NOTE — Telephone Encounter (Signed)
Created in [place]

## 2020-12-12 DIAGNOSIS — Z03818 Encounter for observation for suspected exposure to other biological agents ruled out: Secondary | ICD-10-CM | POA: Diagnosis not present

## 2020-12-12 DIAGNOSIS — Z20822 Contact with and (suspected) exposure to covid-19: Secondary | ICD-10-CM | POA: Diagnosis not present

## 2020-12-20 DIAGNOSIS — Z03818 Encounter for observation for suspected exposure to other biological agents ruled out: Secondary | ICD-10-CM | POA: Diagnosis not present

## 2020-12-20 DIAGNOSIS — Z20822 Contact with and (suspected) exposure to covid-19: Secondary | ICD-10-CM | POA: Diagnosis not present

## 2020-12-24 ENCOUNTER — Ambulatory Visit (INDEPENDENT_AMBULATORY_CARE_PROVIDER_SITE_OTHER): Payer: Medicare Other

## 2020-12-24 DIAGNOSIS — I428 Other cardiomyopathies: Secondary | ICD-10-CM

## 2020-12-25 ENCOUNTER — Telehealth: Payer: Self-pay

## 2020-12-25 LAB — CUP PACEART REMOTE DEVICE CHECK
Battery Remaining Percentage: 35 %
Battery Remaining Percentage: 35 %
Date Time Interrogation Session: 20220706144000
Date Time Interrogation Session: 20220706144400
Implantable Lead Implant Date: 20160929
Implantable Lead Implant Date: 20160929
Implantable Lead Location: 753862
Implantable Lead Location: 753862
Implantable Lead Model: 3401
Implantable Lead Model: 3401
Implantable Pulse Generator Implant Date: 20160929
Implantable Pulse Generator Implant Date: 20160929
Pulse Gen Serial Number: 118449
Pulse Gen Serial Number: 118449

## 2020-12-25 NOTE — Telephone Encounter (Signed)
High alert received for "[employer] Alert for possible device malfunction (Fault Code BD)". Contacted Joey Deakins with [employer] who replied with the following email.  "Joey,  Thank you for your call this morning. The engineers were able to evaluate the battery detail for your patient, Matthew Guerrero, with the [employer], and have determined that the device is depleting prematurely, consistent with the battery advisory behavior. They have provided a 90 day replacement window during which the device should have 8 shocks with charge times less than 15 seconds. This replacement window ends on March 24, 2021. Please let me know if you have any additional questions."  Unsuccessful telephone encounter to discuss with patient who needs a follow up appointment with Dr. Caryl Comes to discuss gen change. HIPAA compliant VM message left requesting call back to [phone].

## 2020-12-27 DIAGNOSIS — Z03818 Encounter for observation for suspected exposure to other biological agents ruled out: Secondary | ICD-10-CM | POA: Diagnosis not present

## 2020-12-27 DIAGNOSIS — Z20822 Contact with and (suspected) exposure to covid-19: Secondary | ICD-10-CM | POA: Diagnosis not present

## 2020-12-31 DIAGNOSIS — G4733 Obstructive sleep apnea (adult) (pediatric): Secondary | ICD-10-CM | POA: Diagnosis not present

## 2021-01-03 DIAGNOSIS — Z20822 Contact with and (suspected) exposure to covid-19: Secondary | ICD-10-CM | POA: Diagnosis not present

## 2021-01-03 DIAGNOSIS — Z03818 Encounter for observation for suspected exposure to other biological agents ruled out: Secondary | ICD-10-CM | POA: Diagnosis not present

## 2021-01-08 DIAGNOSIS — Z03818 Encounter for observation for suspected exposure to other biological agents ruled out: Secondary | ICD-10-CM | POA: Diagnosis not present

## 2021-01-08 DIAGNOSIS — Z20822 Contact with and (suspected) exposure to covid-19: Secondary | ICD-10-CM | POA: Diagnosis not present

## 2021-01-10 NOTE — Progress Notes (Signed)
Remote ICD transmission.   

## 2021-01-15 DIAGNOSIS — Z03818 Encounter for observation for suspected exposure to other biological agents ruled out: Secondary | ICD-10-CM | POA: Diagnosis not present

## 2021-01-15 DIAGNOSIS — Z20822 Contact with and (suspected) exposure to covid-19: Secondary | ICD-10-CM | POA: Diagnosis not present

## 2021-01-15 NOTE — H&P (View-Only) (Signed)
Electrophysiology Office Note Date: 01/16/2021  ID:  Matthew Guerrero, DOB September 21, 1955, MRN [id number]  PCP: Biagio Borg, MD Primary Cardiologist: Pixie Casino, MD Electrophysiologist: [name], MD   CC: Routine ICD follow-up  Matthew Guerrero is a 65 y.o. male seen today for [name], MD for routine electrophysiology followup.  Since last being seen in our clinic the patient reports doing very well. S-ICD generator has been identified for early battery depletion and has until 03/24/2021 to be replaced.  he denies chest pain, palpitations, dyspnea, PND, orthopnea, nausea, vomiting, dizziness, syncope, edema, weight gain, or early satiety. He has not had ICD shocks.   Device History: Chemical engineer S-ICD ICD implanted 02/2015 for NICM  Past Medical History:  Diagnosis Date   ALLERGIC RHINITIS    Asthma    B12 deficiency 04/06/2020   Cervical radiculitis    CHF (congestive heart failure) ([facility])    Chronic systolic heart failure ([place]) 02/28/2015   GERD (gastroesophageal reflux disease)    GOUT    Hyperglycemia 08/28/2014   HYPERLIPIDEMIA    HYPERTENSION    Lateral epicondylitis of right elbow    Nonischemic cardiomyopathy ([place]) 09/28/2014   Obesity (BMI 30-39.9) 06/06/2015   S/P cardiac cath wjith normal coronary arteries  09/28/2014   Sleep apnea    Thrombocytopenia ([place]) 04/06/2020   Past Surgical History:  Procedure Laterality Date   COLONOSCOPY     COLONOSCOPY WITH PROPOFOL N/A 06/17/2017   Procedure: COLONOSCOPY WITH PROPOFOL;  Surgeon: Milus Banister, MD;  Location: [facility] ENDOS[facility];  Service: Endoscopy;  Laterality: N/A;   EP IMPLANTABLE DEVICE N/A 03/21/2015   Procedure: SubQ ICD Implant;  Surgeon: Deboraha Sprang, MD;  Location: [facility] CV LAB;  Service: Cardiovascular;  Laterality: N/A;   LEFT AND RIGHT HEART CATHETERIZATION WITH CORONARY ANGIOGRAM N/A 09/27/2014   Procedure: LEFT AND RIGHT HEART CATHETERIZATION WITH CORONARY ANGIOGRAM;  Surgeon: Belva Crome,  MD;  Location: [facility] CATH LAB;  Service: Cardiovascular;  Laterality: N/A;   SUBQ ICD  03/21/2015    Current Outpatient Medications  Medication Sig Dispense Refill   carvedilol (COREG) 25 MG tablet TAKE 1 TABLET BY MOUTH  TWICE DAILY WITH A MEAL 180 tablet 3   colchicine 0.6 MG tablet TAKE 1 TABLET BY MOUTH  DAILY 90 tablet 3   ENTRESTO 97-103 MG TAKE 1 TABLET BY MOUTH  TWICE DAILY 180 tablet 3   furosemide (LASIX) 40 MG tablet TAKE 1 TABLET BY MOUTH  DAILY 90 tablet 3   pantoprazole (PROTONIX) 40 MG tablet TAKE 1 TABLET BY MOUTH  DAILY 90 tablet 3   pravastatin (PRAVACHOL) 40 MG tablet TAKE 1 TABLET BY MOUTH IN  THE EVENING 90 tablet 3   spironolactone (ALDACTONE) 25 MG tablet Take 1 tablet (25 mg total) by mouth daily. 90 tablet 3   vitamin B-12 (CYANOCOBALAMIN) 1000 MCG tablet Take 1 tablet (1,000 mcg total) by mouth daily. 90 tablet 3   No current facility-administered medications for this visit.    Allergies:   Patient has no known allergies.   Social History: Social History   Socioeconomic History   Marital status: Single    Spouse name: Not on file   Number of children: Not on file   Years of education: Not on file   Highest education level: Not on file  Occupational History   Occupation: retired  Tobacco Use   Smoking status: Never   Smokeless tobacco: Never  Vaping Use   Vaping Use:  Never used  Substance and Sexual Activity   Alcohol use: No    Alcohol/week: 0.0 standard drinks   Drug use: No   Sexual activity: Not Currently  Other Topics Concern   Not on file  Social History Narrative   Not on file   Social Determinants of Health   Financial Resource Strain: Low Risk    Difficulty of Paying Living Expenses: Not hard at all  Food Insecurity: No Food Insecurity   Worried About Charity fundraiser in the Last Year: Never true   [place] in the Last Year: Never true  Transportation Needs: No Transportation Needs   Lack of Transportation (Medical): No    Lack of Transportation (Non-Medical): No  Physical Activity: Sufficiently Active   Days of Exercise per Week: 5 days   Minutes of Exercise per Session: 30 min  Stress: No Stress Concern Present   Feeling of Stress : Not at all  Social Connections: Moderately Integrated   Frequency of Communication with Friends and Family: More than three times a week   Frequency of Social Gatherings with Friends and Family: Once a week   Attends Religious Services: More than 4 times per year   Active Member of [employer] or Organizations: Yes   Attends Music therapist: More than 4 times per year   Marital Status: Never married  Human resources officer Violence: Not on file    Family History: Family History  Problem Relation Age of Onset   Lung cancer Mother    Diabetes Father    Heart disease Father    Heart attack Father 79   Hypertension Sister    Aneurysm Sister 65       brain aneurysm   Healthy Brother    Healthy Brother    Healthy Brother    Colon cancer Neg Hx    Esophageal cancer Neg Hx    Rectal cancer Neg Hx    Stomach cancer Neg Hx     Review of Systems: All other systems reviewed and are otherwise negative except as noted above.   Physical Exam: Vitals:   01/16/21 1143  BP: 120/74  Pulse: 82  SpO2: 98%  Weight: 199 lb (90.3 kg)  Height: '5\' 10"'$  (1.778 m)     GEN- The patient is well appearing, alert and oriented x 3 today.   HEENT: normocephalic, atraumatic; sclera clear, conjunctiva pink; hearing intact; oropharynx clear; neck supple, no JVP Lymph- no cervical lymphadenopathy Lungs- Clear to ausculation bilaterally, normal work of breathing.  No wheezes, rales, rhonchi Heart- Regular rate and rhythm, no murmurs, rubs or gallops, PMI not laterally displaced GI- soft, non-tender, non-distended, bowel sounds present, no hepatosplenomegaly Extremities- no clubbing or cyanosis. No edema; DP/PT/radial pulses 2+ bilaterally MS- no significant deformity or atrophy Skin-  warm and dry, no rash or lesion; ICD pocket well healed Psych- euthymic mood, full affect Neuro- strength and sensation are intact  ICD interrogation- reviewed in detail today,  See PACEART report  EKG:  EKG is not ordered today.   Recent Labs: 04/05/2020: ALT 13; Hemoglobin 15.1; Platelets 120.0; TSH 0.42 10/09/2020: BUN 13; Creatinine, Ser 1.14; Potassium 4.1; Sodium 142   Wt Readings from Last 3 Encounters:  01/16/21 199 lb (90.3 kg)  10/09/20 185 lb 6.4 oz (84.1 kg)  10/07/20 182 lb 12.8 oz (82.9 kg)     Other studies Reviewed: Additional studies/ records that were reviewed today include: Previous EP office notes. Phone note re: [employer] recall  Assessment and Plan:  1.  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  See Claudia Desanctis Art report No changes today If gen change > 03/05/21, will need updated Echo.  His device has been identified as having battery malfunction and at this time requires generator change. See phone note 12/25/20 for further.  2. HTN Continue current medications  Current medicines are reviewed at length with the patient today.   The patient does not have concerns regarding his medicines.  The following changes were made today:  none  Labs/ tests ordered today include:  Orders Placed This Encounter  Procedures   Basic metabolic panel   CBC   Disposition:   Follow up with Dr. Caryl Comes  per usual post gen change  Signed, Shirley Friar, PA-C  01/16/2021 12:26 PM  [address] [place] [address] [phone] (office) [phone] (fax)

## 2021-01-15 NOTE — Progress Notes (Signed)
Electrophysiology Office Note Date: 01/16/2021  ID:  Matthew Guerrero, DOB 01-16-1956, MRN [id number]  PCP: Biagio Borg, MD Primary Cardiologist: Pixie Casino, MD Electrophysiologist: [name], MD   CC: Routine ICD follow-up  Matthew Guerrero is a 65 y.o. male seen today for [name], MD for routine electrophysiology followup.  Since last being seen in our clinic the patient reports doing very well. S-ICD generator has been identified for early battery depletion and has until 03/24/2021 to be replaced.  he denies chest pain, palpitations, dyspnea, PND, orthopnea, nausea, vomiting, dizziness, syncope, edema, weight gain, or early satiety. He has not had ICD shocks.   Device History: Chemical engineer S-ICD ICD implanted 02/2015 for NICM  Past Medical History:  Diagnosis Date   ALLERGIC RHINITIS    Asthma    B12 deficiency 04/06/2020   Cervical radiculitis    CHF (congestive heart failure) (HCC)    Chronic systolic heart failure ([place]) 02/28/2015   GERD (gastroesophageal reflux disease)    GOUT    Hyperglycemia 08/28/2014   HYPERLIPIDEMIA    HYPERTENSION    Lateral epicondylitis of right elbow    Nonischemic cardiomyopathy ([place]) 09/28/2014   Obesity (BMI 30-39.9) 06/06/2015   S/P cardiac cath wjith normal coronary arteries  09/28/2014   Sleep apnea    Thrombocytopenia ([place]) 04/06/2020   Past Surgical History:  Procedure Laterality Date   COLONOSCOPY     COLONOSCOPY WITH PROPOFOL N/A 06/17/2017   Procedure: COLONOSCOPY WITH PROPOFOL;  Surgeon: Milus Banister, MD;  Location: [facility];  Service: Endoscopy;  Laterality: N/A;   EP IMPLANTABLE DEVICE N/A 03/21/2015   Procedure: SubQ ICD Implant;  Surgeon: Deboraha Sprang, MD;  Location: [facility];  Service: Cardiovascular;  Laterality: N/A;   LEFT AND RIGHT HEART CATHETERIZATION WITH CORONARY ANGIOGRAM N/A 09/27/2014   Procedure: LEFT AND RIGHT HEART CATHETERIZATION WITH CORONARY ANGIOGRAM;  Surgeon: Belva Crome,  MD;  Location: [facility] CATH LAB;  Service: Cardiovascular;  Laterality: N/A;   SUBQ ICD  03/21/2015    Current Outpatient Medications  Medication Sig Dispense Refill   carvedilol (COREG) 25 MG tablet TAKE 1 TABLET BY MOUTH  TWICE DAILY WITH A MEAL 180 tablet 3   colchicine 0.6 MG tablet TAKE 1 TABLET BY MOUTH  DAILY 90 tablet 3   ENTRESTO 97-103 MG TAKE 1 TABLET BY MOUTH  TWICE DAILY 180 tablet 3   furosemide (LASIX) 40 MG tablet TAKE 1 TABLET BY MOUTH  DAILY 90 tablet 3   pantoprazole (PROTONIX) 40 MG tablet TAKE 1 TABLET BY MOUTH  DAILY 90 tablet 3   pravastatin (PRAVACHOL) 40 MG tablet TAKE 1 TABLET BY MOUTH IN  THE EVENING 90 tablet 3   spironolactone (ALDACTONE) 25 MG tablet Take 1 tablet (25 mg total) by mouth daily. 90 tablet 3   vitamin B-12 (CYANOCOBALAMIN) 1000 MCG tablet Take 1 tablet (1,000 mcg total) by mouth daily. 90 tablet 3   No current facility-administered medications for this visit.    Allergies:   Patient has no known allergies.   Social History: Social History   Socioeconomic History   Marital status: Single    Spouse name: Not on file   Number of children: Not on file   Years of education: Not on file   Highest education level: Not on file  Occupational History   Occupation: retired  Tobacco Use   Smoking status: Never   Smokeless tobacco: Never  Vaping Use   Vaping Use:  Never used  Substance and Sexual Activity   Alcohol use: No    Alcohol/week: 0.0 standard drinks   Drug use: No   Sexual activity: Not Currently  Other Topics Concern   Not on file  Social History Narrative   Not on file   Social Determinants of Health   Financial Resource Strain: Low Risk    Difficulty of Paying Living Expenses: Not hard at all  Food Insecurity: No Food Insecurity   Worried About Charity fundraiser in the Last Year: Never true   [place] in the Last Year: Never true  Transportation Needs: No Transportation Needs   Lack of Transportation (Medical): No    Lack of Transportation (Non-Medical): No  Physical Activity: Sufficiently Active   Days of Exercise per Week: 5 days   Minutes of Exercise per Session: 30 min  Stress: No Stress Concern Present   Feeling of Stress : Not at all  Social Connections: Moderately Integrated   Frequency of Communication with Friends and Family: More than three times a week   Frequency of Social Gatherings with Friends and Family: Once a week   Attends Religious Services: More than 4 times per year   Active Member of [employer] or Organizations: Yes   Attends Music therapist: More than 4 times per year   Marital Status: Never married  Human resources officer Violence: Not on file    Family History: Family History  Problem Relation Age of Onset   Lung cancer Mother    Diabetes Father    Heart disease Father    Heart attack Father 34   Hypertension Sister    Aneurysm Sister 70       brain aneurysm   Healthy Brother    Healthy Brother    Healthy Brother    Colon cancer Neg Hx    Esophageal cancer Neg Hx    Rectal cancer Neg Hx    Stomach cancer Neg Hx     Review of Systems: All other systems reviewed and are otherwise negative except as noted above.   Physical Exam: Vitals:   01/16/21 1143  BP: 120/74  Pulse: 82  SpO2: 98%  Weight: 199 lb (90.3 kg)  Height: '5\' 10"'$  (1.778 m)     GEN- The patient is well appearing, alert and oriented x 3 today.   HEENT: normocephalic, atraumatic; sclera clear, conjunctiva pink; hearing intact; oropharynx clear; neck supple, no JVP Lymph- no cervical lymphadenopathy Lungs- Clear to ausculation bilaterally, normal work of breathing.  No wheezes, rales, rhonchi Heart- Regular rate and rhythm, no murmurs, rubs or gallops, PMI not laterally displaced GI- soft, non-tender, non-distended, bowel sounds present, no hepatosplenomegaly Extremities- no clubbing or cyanosis. No edema; DP/PT/radial pulses 2+ bilaterally MS- no significant deformity or atrophy Skin-  warm and dry, no rash or lesion; ICD pocket well healed Psych- euthymic mood, full affect Neuro- strength and sensation are intact  ICD interrogation- reviewed in detail today,  See PACEART report  EKG:  EKG is not ordered today.   Recent Labs: 04/05/2020: ALT 13; Hemoglobin 15.1; Platelets 120.0; TSH 0.42 10/09/2020: BUN 13; Creatinine, Ser 1.14; Potassium 4.1; Sodium 142   Wt Readings from Last 3 Encounters:  01/16/21 199 lb (90.3 kg)  10/09/20 185 lb 6.4 oz (84.1 kg)  10/07/20 182 lb 12.8 oz (82.9 kg)     Other studies Reviewed: Additional studies/ records that were reviewed today include: Previous EP office notes. Phone note re: [employer] recall  Assessment and Plan:  1.  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  See Claudia Desanctis Art report No changes today If gen change > 03/05/21, will need updated Echo.  His device has been identified as having battery malfunction and at this time requires generator change. See phone note 12/25/20 for further.  2. HTN Continue current medications  Current medicines are reviewed at length with the patient today.   The patient does not have concerns regarding his medicines.  The following changes were made today:  none  Labs/ tests ordered today include:  Orders Placed This Encounter  Procedures   Basic metabolic panel   CBC   Disposition:   Follow up with Dr. Caryl Comes  per usual post gen change  Signed, Shirley Friar, PA-C  01/16/2021 12:26 PM  [address] [place] [address] [phone] (office) [phone] (fax)

## 2021-01-16 ENCOUNTER — Encounter: Payer: Self-pay | Admitting: Student

## 2021-01-16 ENCOUNTER — Telehealth: Payer: Self-pay | Admitting: Student

## 2021-01-16 ENCOUNTER — Ambulatory Visit (INDEPENDENT_AMBULATORY_CARE_PROVIDER_SITE_OTHER): Payer: Medicare Other | Admitting: Student

## 2021-01-16 ENCOUNTER — Other Ambulatory Visit: Payer: Self-pay

## 2021-01-16 VITALS — BP 120/74 | HR 82 | Ht 70.0 in | Wt 199.0 lb

## 2021-01-16 DIAGNOSIS — I5022 Chronic systolic (congestive) heart failure: Secondary | ICD-10-CM | POA: Diagnosis not present

## 2021-01-16 DIAGNOSIS — I428 Other cardiomyopathies: Secondary | ICD-10-CM | POA: Diagnosis not present

## 2021-01-16 LAB — CUP PACEART INCLINIC DEVICE CHECK
Date Time Interrogation Session: 20220728122611
Implantable Lead Implant Date: 20160929
Implantable Lead Location: 753862
Implantable Lead Model: 3401
Implantable Pulse Generator Implant Date: 20160929
Pulse Gen Serial Number: 118449

## 2021-01-16 NOTE — Patient Instructions (Signed)
Medication Instructions: Your physician recommends that you continue on your current medications as directed. Please refer to the Current Medication list given to you today.  Labwork: Your physician has recommended that you have lab work today: BMET and CBC  Procedures/Testing: Your physician has recommended that you have a Generator Change of your device. This is a procedure that replaces a Pacemaker ICD generator that is at the end of its service life. The remaining lifespan of a pacemaker is determined during visits to the [facility]. The battery in a pacemaker does not stop suddenly but rather loses its charge slowly, which lets the cardiologist plan the replacement date.  Follow-Up: Your physician recommends that you schedule a follow-up appointment in 10 - 14 days from 02/10/2021 with the Device clinic for a wound check  Your physician recommends that you schedule a follow-up appointment in 3 months from 02/10/2021 with Dr. Caryl Comes.   If you need a refill on your cardiac medications before your next appointment, please call your pharmacy.   -------------------------------------------------------------------------------------------------------------  Please wash with the CHG Soap the night before and morning of procedure (follow instruction page "Preparing For Surgery").   Please report to the [place] [facility]" of [facility] at 7:30am on 02/10/2021  ([place], [place] [address])  DO NOT eat or drink anything after midnight the night before procedure  ONLY take Carvedilol and Entresto the morning of your procedure  You will need someone to drive you home after the procedure  --------------------------------------------------------------------------------------------------------------  [facility] - Preparing For Surgery  Before surgery, you can play an important role. Because skin is not sterile, your skin needs to be as free of  germs as possible. You can reduce the number of germs on your skin by washing with CHG (chlorahexidine gluconate) Soap before surgery.  CHG is an antiseptic cleaner which kills germs and bonds with the skin to continue killing germs even after washing.   Please do not use if you have an allergy to CHG or antibacterial soaps.  If your skin becomes reddened/irritated stop using the CHG.   Do not shave (including legs and underarms) for at least 48 hours prior to first CHG shower.  It is OK to shave your face.  Please follow these instructions carefully:  1.  Shower the night before surgery and the morning of surgery with CHG.  2.  If you choose to wash your hair, wash your hair first as usual with your normal shampoo.  3.  After you shampoo, rinse your hair and body thoroughly to remove the shampoo.  4.  Use CHG as you would any other liquid soap.  You can apply CHG directly to the skin and wash gently with a clean washcloth. 5.  Apply the CHG Soap to your body ONLY FROM THE NECK DOWN.  Do not use on open wounds or open sores.  Avoid contact with your eyes, ears, mouth and genitals (private parts).  Wash genitals (private parts) with your normal soap.  6.  Wash thoroughly, paying special attention to the area where your surgery will be performed.  7.  Thoroughly rinse your body with warm water from the neck down.   8.  DO NOT shower/wash with your normal soap after using and rinsing off the CHG soap.  9.  Pat yourself dry with a clean towel.           10.  Wear clean pajamas.  11.  Place clean sheets on your bed the night of your first shower and do not sleep with pets.  Day of Surgery: Do not apply any deodorants/lotions.  Please wear clean clothes to the hospital/surgery center.

## 2021-01-16 NOTE — Telephone Encounter (Signed)
I called pt back, he needed to know if he would have to stay overnight for his Gen Change. I advised him that he would go home same day as long as there are no complications.

## 2021-01-16 NOTE — Telephone Encounter (Signed)
Pt is requesting a callback from April. Please advise pt further

## 2021-01-17 LAB — CBC
Hematocrit: 45.4 % (ref 37.5–51.0)
Hemoglobin: 14.6 g/dL (ref 13.0–17.7)
MCH: 27.9 pg (ref 26.6–33.0)
MCHC: 32.2 g/dL (ref 31.5–35.7)
MCV: 87 fL (ref 79–97)
Platelets: 103 10*3/uL — ABNORMAL LOW (ref 150–450)
RBC: 5.23 x10E6/uL (ref 4.14–5.80)
RDW: 14 % (ref 11.6–15.4)
WBC: 7.3 10*3/uL (ref 3.4–10.8)

## 2021-01-17 LAB — BASIC METABOLIC PANEL
BUN/Creatinine Ratio: 9 — ABNORMAL LOW (ref 10–24)
BUN: 11 mg/dL (ref 8–27)
CO2: 22 mmol/L (ref 20–29)
Calcium: 9.2 mg/dL (ref 8.6–10.2)
Chloride: 103 mmol/L (ref 96–106)
Creatinine, Ser: 1.16 mg/dL (ref 0.76–1.27)
Glucose: 104 mg/dL — ABNORMAL HIGH (ref 65–99)
Potassium: 4.2 mmol/L (ref 3.5–5.2)
Sodium: 142 mmol/L (ref 134–144)
eGFR: 69 mL/min/{1.73_m2} (ref >59)

## 2021-01-22 DIAGNOSIS — Z20822 Contact with and (suspected) exposure to covid-19: Secondary | ICD-10-CM | POA: Diagnosis not present

## 2021-01-27 ENCOUNTER — Telehealth: Payer: Self-pay | Admitting: *Deleted

## 2021-01-27 NOTE — Telephone Encounter (Signed)
Patient called stating he has severe nasal dryness which caused a bump in his nose that sometimes drains. He is asking for advice on how to treat it or does he need a antibiotic.

## 2021-01-28 NOTE — Telephone Encounter (Signed)
Patient declined the ENT referral but he will get the nasal saline spray.

## 2021-01-30 DIAGNOSIS — Z03818 Encounter for observation for suspected exposure to other biological agents ruled out: Secondary | ICD-10-CM | POA: Diagnosis not present

## 2021-01-30 DIAGNOSIS — Z20822 Contact with and (suspected) exposure to covid-19: Secondary | ICD-10-CM | POA: Diagnosis not present

## 2021-02-07 ENCOUNTER — Telehealth: Payer: Self-pay | Admitting: Internal Medicine

## 2021-02-07 NOTE — Telephone Encounter (Signed)
Matthew Guerrero is calling requesting to speak with Matthew Guerrero in regards to his upcoming procedure. He is wanting to know exactly what time he will be released, due to getting a ride to and from the procedure and needing to let them know when to pick him up.

## 2021-02-07 NOTE — Telephone Encounter (Signed)
Returned pt's call regarding his ride home from the hospital.

## 2021-02-07 NOTE — Pre-Procedure Instructions (Signed)
Instructed patient on the following items: Arrival time 0730 Nothing to eat or drink after midnight No meds AM of procedure Responsible person to drive you home and stay with you for 24 hrs Wash with special soap night before and morning of procedure  Pt should be discharged around 6pm, after 6hrs of bedrest

## 2021-02-10 ENCOUNTER — Ambulatory Visit (HOSPITAL_COMMUNITY)
Admission: RE | Admit: 2021-02-10 | Discharge: 2021-02-10 | Disposition: A | Discharge status: Home / Self Care | Payer: Medicare Other | Attending: Internal Medicine | Admitting: Internal Medicine

## 2021-02-10 ENCOUNTER — Encounter (HOSPITAL_COMMUNITY): Payer: Self-pay | Admitting: Internal Medicine

## 2021-02-10 ENCOUNTER — Ambulatory Visit (HOSPITAL_COMMUNITY): Payer: Medicare Other | Admitting: Anesthesiology

## 2021-02-10 ENCOUNTER — Encounter (HOSPITAL_COMMUNITY)
Admission: RE | Disposition: A | Discharge status: Home / Self Care | Payer: Self-pay | Source: Home / Self Care | Attending: Internal Medicine

## 2021-02-10 ENCOUNTER — Other Ambulatory Visit: Payer: Self-pay

## 2021-02-10 DIAGNOSIS — Z4502 Encounter for adjustment and management of automatic implantable cardiac defibrillator: Secondary | ICD-10-CM | POA: Insufficient documentation

## 2021-02-10 DIAGNOSIS — Z8249 Family history of ischemic heart disease and other diseases of the circulatory system: Secondary | ICD-10-CM | POA: Insufficient documentation

## 2021-02-10 DIAGNOSIS — Z6828 Body mass index (BMI) 28.0-28.9, adult: Secondary | ICD-10-CM | POA: Diagnosis not present

## 2021-02-10 DIAGNOSIS — G4730 Sleep apnea, unspecified: Secondary | ICD-10-CM | POA: Diagnosis not present

## 2021-02-10 DIAGNOSIS — Z79899 Other long term (current) drug therapy: Secondary | ICD-10-CM | POA: Insufficient documentation

## 2021-02-10 DIAGNOSIS — I5022 Chronic systolic (congestive) heart failure: Secondary | ICD-10-CM | POA: Insufficient documentation

## 2021-02-10 DIAGNOSIS — E785 Hyperlipidemia, unspecified: Secondary | ICD-10-CM | POA: Insufficient documentation

## 2021-02-10 DIAGNOSIS — I110 Hypertensive heart disease with heart failure: Secondary | ICD-10-CM | POA: Insufficient documentation

## 2021-02-10 DIAGNOSIS — I428 Other cardiomyopathies: Secondary | ICD-10-CM | POA: Diagnosis not present

## 2021-02-10 DIAGNOSIS — E669 Obesity, unspecified: Secondary | ICD-10-CM | POA: Diagnosis not present

## 2021-02-10 HISTORY — PX: SUBQ ICD IMPLANT: EP1223

## 2021-02-10 SURGERY — SUBQ ICD IMPLANT
Anesthesia: General

## 2021-02-10 MED ORDER — PROPOFOL 10 MG/ML IV BOLUS
INTRAVENOUS | Status: DC | PRN
  Administered 2021-02-10: 80 mg via INTRAVENOUS

## 2021-02-10 MED ORDER — SUGAMMADEX SODIUM 200 MG/2ML IV SOLN
INTRAVENOUS | Status: DC | PRN
  Administered 2021-02-10: 200 mg via INTRAVENOUS

## 2021-02-10 MED ORDER — CEFAZOLIN SODIUM-DEXTROSE 2-4 GM/100ML-% IV SOLN
2.0000 g | INTRAVENOUS | Status: AC
  Administered 2021-02-10: 2 g via INTRAVENOUS

## 2021-02-10 MED ORDER — SODIUM CHLORIDE 0.9 % IV SOLN: INTRAVENOUS | Status: DC

## 2021-02-10 MED ORDER — ACETAMINOPHEN 325 MG PO TABS: 325.0000 mg | ORAL_TABLET | ORAL | Status: DC | PRN

## 2021-02-10 MED ORDER — DEXAMETHASONE SODIUM PHOSPHATE 10 MG/ML IJ SOLN
INTRAMUSCULAR | Status: DC | PRN
  Administered 2021-02-10: 5 mg via INTRAVENOUS

## 2021-02-10 MED ORDER — CEFAZOLIN SODIUM-DEXTROSE 2-4 GM/100ML-% IV SOLN
INTRAVENOUS | Status: AC
  Filled 2021-02-10: qty 100

## 2021-02-10 MED ORDER — ONDANSETRON HCL 4 MG/2ML IJ SOLN: 4.0000 mg | Freq: Four times a day (QID) | INTRAMUSCULAR | Status: DC | PRN

## 2021-02-10 MED ORDER — SODIUM CHLORIDE 0.9 % IV SOLN
INTRAVENOUS | Status: AC
  Filled 2021-02-10: qty 2

## 2021-02-10 MED ORDER — PHENYLEPHRINE 40 MCG/ML (10ML) SYRINGE FOR IV PUSH (FOR BLOOD PRESSURE SUPPORT)
PREFILLED_SYRINGE | INTRAVENOUS | Status: DC | PRN
  Administered 2021-02-10: 80 ug via INTRAVENOUS
  Administered 2021-02-10: 40 ug via INTRAVENOUS

## 2021-02-10 MED ORDER — ACETAMINOPHEN 500 MG PO TABS
1000.0000 mg | ORAL_TABLET | Freq: Once | ORAL | Status: AC
  Administered 2021-02-10: 1000 mg via ORAL

## 2021-02-10 MED ORDER — ROCURONIUM BROMIDE 100 MG/10ML IV SOLN
INTRAVENOUS | Status: DC | PRN
  Administered 2021-02-10: 20 mg via INTRAVENOUS
  Administered 2021-02-10: 40 mg via INTRAVENOUS

## 2021-02-10 MED ORDER — ONDANSETRON HCL 4 MG/2ML IJ SOLN
INTRAMUSCULAR | Status: DC | PRN
  Administered 2021-02-10: 4 mg via INTRAVENOUS

## 2021-02-10 MED ORDER — ACETAMINOPHEN 500 MG PO TABS
ORAL_TABLET | ORAL | Status: AC
  Filled 2021-02-10: qty 2

## 2021-02-10 MED ORDER — FENTANYL CITRATE (PF) 100 MCG/2ML IJ SOLN
INTRAMUSCULAR | Status: DC | PRN
  Administered 2021-02-10 (×2): 50 ug via INTRAVENOUS

## 2021-02-10 MED ORDER — BUPIVACAINE HCL (PF) 0.25 % IJ SOLN
INTRAMUSCULAR | Status: DC | PRN
  Administered 2021-02-10: 60 mL

## 2021-02-10 MED ORDER — BUPIVACAINE HCL (PF) 0.25 % IJ SOLN
INTRAMUSCULAR | Status: AC
  Filled 2021-02-10: qty 60

## 2021-02-10 MED ORDER — EPHEDRINE SULFATE-NACL 50-0.9 MG/10ML-% IV SOSY
PREFILLED_SYRINGE | INTRAVENOUS | Status: DC | PRN
  Administered 2021-02-10 (×3): 5 mg via INTRAVENOUS

## 2021-02-10 MED ORDER — LIDOCAINE 2% (20 MG/ML) 5 ML SYRINGE
INTRAMUSCULAR | Status: DC | PRN
  Administered 2021-02-10: 60 mg via INTRAVENOUS

## 2021-02-10 MED ORDER — GENTAMICIN SULFATE 40 MG/ML IJ SOLN
80.0000 mg | INTRAMUSCULAR | Status: AC
  Administered 2021-02-10: 80 mg

## 2021-02-10 MED ORDER — CHLORHEXIDINE GLUCONATE 4 % EX LIQD
4.0000 | Freq: Once | CUTANEOUS | Status: DC
Start: 2021-02-10 — End: 2021-02-10

## 2021-02-10 MED ORDER — MIDAZOLAM HCL 5 MG/5ML IJ SOLN
INTRAMUSCULAR | Status: DC | PRN
  Administered 2021-02-10: 2 mg via INTRAVENOUS

## 2021-02-10 SURGICAL SUPPLY — 3 items
CABLE SURGICAL S-101-97-12 (CABLE) ×2 IMPLANT
ICD SUBCU MRI EMBLEM A219 (ICD Generator) ×1 IMPLANT
TRAY PACEMAKER INSERTION (PACKS) ×2 IMPLANT

## 2021-02-10 NOTE — Anesthesia Procedure Notes (Signed)
Procedure Name: Intubation Date/Time: 02/10/2021 10:02 AM Performed by: Gwyndolyn Saxon, CRNA Pre-anesthesia Checklist: Patient identified, Emergency Drugs available, Suction available and Patient being monitored Patient Re-evaluated:Patient Re-evaluated prior to induction Oxygen Delivery Method: Circle system utilized Preoxygenation: Pre-oxygenation with 100% oxygen Induction Type: IV induction Ventilation: Mask ventilation without difficulty Laryngoscope Size: Miller and 2 Grade View: Grade I Tube type: Oral Tube size: 7.5 mm Number of attempts: 1 Airway Equipment and Method: Stylet Placement Confirmation: ETT inserted through vocal cords under direct vision, positive ETCO2 and breath sounds checked- equal and bilateral Secured at: 23 cm Tube secured with: Tape Dental Injury: Teeth and Oropharynx as per pre-operative assessment

## 2021-02-10 NOTE — Anesthesia Postprocedure Evaluation (Signed)
Anesthesia Post Note  Patient: Achyuth Wilbourne  Procedure(s) Performed: SUBQ ICD IMPLANT     Patient location during evaluation: PACU Anesthesia Type: General Level of consciousness: awake and alert Pain management: pain level controlled Vital Signs Assessment: post-procedure vital signs reviewed and stable Respiratory status: spontaneous breathing, nonlabored ventilation and respiratory function stable Cardiovascular status: blood pressure returned to baseline and stable Postop Assessment: no apparent nausea or vomiting Anesthetic complications: no   No notable events documented.  Last Vitals:  Vitals:   02/10/21 1154 02/10/21 1155  BP:  113/62  Pulse:  62  Resp:  17  Temp: (!) 36.3 C   SpO2:  99%    Last Pain:  Vitals:   02/10/21 1154  TempSrc: Temporal  PainSc: 0-No pain                 Myosha Cuadras,W. EDMOND

## 2021-02-10 NOTE — Anesthesia Preprocedure Evaluation (Addendum)
Anesthesia Evaluation  Patient identified by MRN, date of birth, ID band Patient awake    Reviewed: Allergy & Precautions, H&P , NPO status , Patient's Chart, lab work & pertinent test results, reviewed documented beta blocker date and time   Airway Mallampati: II  TM Distance: >3 FB Neck ROM: Full    Dental no notable dental hx. (+) Edentulous Upper, Partial Lower, Dental Advisory Given   Pulmonary asthma , sleep apnea ,    Pulmonary exam normal breath sounds clear to auscultation       Cardiovascular hypertension, Pt. on medications and Pt. on home beta blockers +CHF  negative cardio ROS   Rhythm:Regular Rate:Normal     Neuro/Psych negative neurological ROS  negative psych ROS   GI/Hepatic Neg liver ROS, GERD  Medicated,  Endo/Other  negative endocrine ROS  Renal/GU negative Renal ROS  negative genitourinary   Musculoskeletal  (+) Arthritis , Osteoarthritis,    Abdominal   Peds  Hematology negative hematology ROS (+)   Anesthesia Other Findings   Reproductive/Obstetrics negative OB ROS                            Anesthesia Physical Anesthesia Plan  ASA: 3  Anesthesia Plan: General   Post-op Pain Management:    Induction: Intravenous  PONV Risk Score and Plan: 3 and Ondansetron, Dexamethasone and Midazolam  Airway Management Planned: Oral ETT  Additional Equipment:   Intra-op Plan:   Post-operative Plan: Extubation in OR  Informed Consent: I have reviewed the patients History and Physical, chart, labs and discussed the procedure including the risks, benefits and alternatives for the proposed anesthesia with the patient or authorized representative who has indicated his/her understanding and acceptance.     Dental advisory given  Plan Discussed with: CRNA  Anesthesia Plan Comments:         Anesthesia Quick Evaluation

## 2021-02-10 NOTE — Progress Notes (Signed)
Patient was given discharge instructions. He verbalized understanding. 

## 2021-02-10 NOTE — Transfer of Care (Signed)
Immediate Anesthesia Transfer of Care Note  Patient: Holten Marzec  Procedure(s) Performed: SUBQ ICD IMPLANT  Patient Location: PACU  Anesthesia Type:General  Level of Consciousness: drowsy and patient cooperative  Airway & Oxygen Therapy: Patient Spontanous Breathing  Post-op Assessment: Report given to RN and Post -op Vital signs reviewed and stable  Post vital signs: Reviewed and stable  Last Vitals:  Vitals Value Taken Time  BP    Temp    Pulse 58 02/10/21 1124  Resp 12 02/10/21 1124  SpO2 98 % 02/10/21 1124  Vitals shown include unvalidated device data.  Last Pain:  Vitals:   02/10/21 0744  PainSc: 0-No pain         Complications: No notable events documented.

## 2021-02-10 NOTE — Interval H&P Note (Signed)
History and Physical Interval Note:  02/10/2021 9:00 AM  Matthew Guerrero  has presented today for surgery, with the diagnosis of ERI.  The various methods of treatment have been discussed with the patient and family. After consideration of risks, benefits and other options for treatment, the patient has consented to  Procedure(s): SUBQ ICD IMPLANT (N/A) as a surgical intervention.  The patient's history has been reviewed, patient examined, no change in status, stable for surgery.  I have reviewed the patient's chart and labs.  Questions were answered to the patient's satisfaction.     [name]

## 2021-02-10 NOTE — Discharge Instructions (Addendum)
    Supplemental Discharge Instructions for  Pacemaker/Defibrillator Patients  Activity No heavy lifting or vigorous activity until wound is healed and cleared to at your wound check visit.    WOUND CARE Keep the wound area clean and dry.  Do not get this area wet , no showers for 24 hours, you can shower 02/11/21 evening. Dr. Caryl Comes used DERMABOND to close your wound.  DO NOT peel this off.  Do not rub/scrub the area, pat dry. No bandage is needed on the site.  DO  NOT apply any creams, oils, or ointments to the wound area. If you notice any drainage or discharge from the wound, any swelling or bruising at the site, or you develop a fever > 101? F after you are discharged home, call the office at once.  Special Instructions You are still able to use cellular telephones; use the ear opposite the side where you have your pacemaker/defibrillator.  Avoid carrying your cellular phone near your device. When traveling through airports, show security personnel your identification card to avoid being screened in the metal detectors.  Ask the security personnel to use the hand wand. Avoid arc welding equipment, MRI testing (magnetic resonance imaging), TENS units (transcutaneous nerve stimulators).  Call the office for questions about other devices. Avoid electrical appliances that are in poor condition or are not properly grounded. Microwave ovens are safe to be near or to operate.  Additional information for defibrillator patients should your device go off: If your device goes off ONCE and you feel fine afterward, notify the device clinic nurses. If your device goes off ONCE and you do not feel well afterward, call 911. If your device goes off TWICE, call 911. If your device goes off THREE times in one day, call 911.  DO NOT DRIVE YOURSELF OR A FAMILY MEMBER WITH A DEFIBRILLATOR TO THE HOSPITAL--CALL 911.

## 2021-02-11 ENCOUNTER — Other Ambulatory Visit: Payer: Self-pay | Admitting: Internal Medicine

## 2021-02-11 DIAGNOSIS — E785 Hyperlipidemia, unspecified: Secondary | ICD-10-CM

## 2021-02-18 ENCOUNTER — Other Ambulatory Visit: Payer: Self-pay | Admitting: Internal Medicine

## 2021-02-18 ENCOUNTER — Other Ambulatory Visit: Payer: Self-pay | Admitting: Physician Assistant

## 2021-02-18 NOTE — Progress Notes (Signed)
Patient Care Team: Biagio Borg, MD as PCP - General (Internal Medicine) Debara Pickett Nadean Corwin, MD as PCP - Cardiology (Cardiology) Sueanne Margarita, MD as PCP - Sleep Medicine (Cardiology) Deboraha Sprang, MD as PCP - Electrophysiology (Cardiology)  DIAGNOSIS:    ICD-10-CM   1. Polycythemia, secondary  D75.1        CHIEF COMPLIANT:  Follow-up of secondary polycythemia and thrombocytopenia  INTERVAL HISTORY: Matthew Guerrero is a 65 y.o. with above-mentioned history of secondary polycythemia and low-grade ITP. He presents to the clinic today for annual follow-up.  Over the past year he has cleaned up his diet and has been exercising and losing weight.  He does not have any other problems or concerns.  ALLERGIES:  has No Known Allergies.  MEDICATIONS:  Current Outpatient Medications  Medication Sig Dispense Refill   aspirin EC 81 MG tablet Take 81 mg by mouth daily. Swallow whole.     carvedilol (COREG) 25 MG tablet TAKE 1 TABLET BY MOUTH  TWICE DAILY WITH A MEAL 180 tablet 3   colchicine 0.6 MG tablet TAKE 1 TABLET BY MOUTH  DAILY 90 tablet 3   ENTRESTO 97-103 MG TAKE 1 TABLET BY MOUTH  TWICE DAILY 180 tablet 3   furosemide (LASIX) 40 MG tablet TAKE 1 TABLET BY MOUTH  DAILY 90 tablet 3   pantoprazole (PROTONIX) 40 MG tablet TAKE 1 TABLET BY MOUTH  DAILY 90 tablet 3   pravastatin (PRAVACHOL) 40 MG tablet TAKE 1 TABLET BY MOUTH IN  THE EVENING 90 tablet 3   spironolactone (ALDACTONE) 25 MG tablet Take 1 tablet (25 mg total) by mouth daily. (Patient taking differently: Take 12.5 mg by mouth daily.) 90 tablet 3   vitamin B-12 (CYANOCOBALAMIN) 1000 MCG tablet Take 1 tablet (1,000 mcg total) by mouth daily. 90 tablet 3   No current facility-administered medications for this visit.    PHYSICAL EXAMINATION: ECOG PERFORMANCE STATUS: 1 - Symptomatic but completely ambulatory  Vitals:   02/19/21 0815  BP: 107/69  Pulse: 74  Resp: 18  Temp: 97.7 F (36.5 C)  SpO2: 98%   Filed  Weights   02/19/21 0815  Weight: 201 lb 9.6 oz (91.4 kg)    LABORATORY DATA:  I have reviewed the data as listed CMP Latest Ref Rng & Units 02/19/2021 01/16/2021 10/09/2020  Glucose 70 - 99 mg/dL 144(H) 104(H) 99  BUN 8 - 23 mg/dL '10 11 13  '$ Creatinine 0.61 - 1.24 mg/dL 1.13 1.16 1.14  Sodium 135 - 145 mmol/L 139 142 142  Potassium 3.5 - 5.1 mmol/L 4.1 4.2 4.1  Chloride 98 - 111 mmol/L 105 103 103  CO2 22 - 32 mmol/L '25 22 22  '$ Calcium 8.9 - 10.3 mg/dL 9.5 9.2 9.7  Total Protein 6.5 - 8.1 g/dL 7.2 - -  Total Bilirubin 0.3 - 1.2 mg/dL 0.6 - -  Alkaline Phos 38 - 126 U/L 50 - -  AST 15 - 41 U/L 16 - -  ALT 0 - 44 U/L 16 - -    Lab Results  Component Value Date   WBC 6.2 02/19/2021   HGB 15.0 02/19/2021   HCT 45.2 02/19/2021   MCV 86.6 02/19/2021   PLT 151 02/19/2021   NEUTROABS 3.1 02/19/2021    ASSESSMENT & PLAN:  Polycythemia, secondary Negative JAK2 V617F mutation. Erythropoeitin 18.6 (mildly elevated) 10/26/14 Recommendation: 1. Most likely related to underlying cardiac disease 2. Phlebotomy is not the standard of care for secondary polycythemia. Today's hemoglobin  15   Thrombocytopenia: Suspicious for low grade ITP (Large platelets on smear suggest increased destruction). Today is 112 which is slowly declining compared to previous levels. Immature platelet fraction: 9.7: Elevated suggesting ITP as the possible diagnosis.  Today's platelet 150 No indication for treatment.   He has lost some weight and it appears that his blood counts have improved. He will continue to exercise and monitor his weight. There is no indication for treatment at this time.  He will be seen now on an as-needed basis.    No orders of the defined types were placed in this encounter.  The patient has a good understanding of the overall plan. he agrees with it. he will call with any problems that may develop before the next visit here.  Total time spent: 20 mins including face to face time  and time spent for planning, charting and coordination of care  Rulon Eisenmenger, MD, MPH 02/19/2021  I, Thana Ates, am acting as scribe for Dr. Nicholas Lose.  I have reviewed the above documentation for accuracy and completeness, and I agree with the above.

## 2021-02-19 ENCOUNTER — Inpatient Hospital Stay: Payer: Medicare Other | Attending: Hematology and Oncology

## 2021-02-19 ENCOUNTER — Inpatient Hospital Stay (HOSPITAL_BASED_OUTPATIENT_CLINIC_OR_DEPARTMENT_OTHER): Payer: Medicare Other | Admitting: Hematology and Oncology

## 2021-02-19 ENCOUNTER — Other Ambulatory Visit: Payer: Self-pay | Admitting: Internal Medicine

## 2021-02-19 ENCOUNTER — Other Ambulatory Visit: Payer: Self-pay | Admitting: Hematology and Oncology

## 2021-02-19 ENCOUNTER — Other Ambulatory Visit: Payer: Self-pay

## 2021-02-19 DIAGNOSIS — D693 Immune thrombocytopenic purpura: Secondary | ICD-10-CM | POA: Diagnosis not present

## 2021-02-19 DIAGNOSIS — D751 Secondary polycythemia: Secondary | ICD-10-CM | POA: Diagnosis not present

## 2021-02-19 LAB — CBC WITH DIFFERENTIAL (CANCER CENTER ONLY)
Abs Immature Granulocytes: 0.02 10*3/uL (ref 0.00–0.07)
Basophils Absolute: 0 10*3/uL (ref 0.0–0.1)
Basophils Relative: 0 %
Eosinophils Absolute: 0.2 10*3/uL (ref 0.0–0.5)
Eosinophils Relative: 3 %
HCT: 45.2 % (ref 39.0–52.0)
Hemoglobin: 15 g/dL (ref 13.0–17.0)
Immature Granulocytes: 0 %
Lymphocytes Relative: 38 %
Lymphs Abs: 2.3 10*3/uL (ref 0.7–4.0)
MCH: 28.7 pg (ref 26.0–34.0)
MCHC: 33.2 g/dL (ref 30.0–36.0)
MCV: 86.6 fL (ref 80.0–100.0)
Monocytes Absolute: 0.5 10*3/uL (ref 0.1–1.0)
Monocytes Relative: 8 %
Neutro Abs: 3.1 10*3/uL (ref 1.7–7.7)
Neutrophils Relative %: 51 %
Platelet Count: 151 10*3/uL (ref 150–400)
RBC: 5.22 MIL/uL (ref 4.22–5.81)
RDW: 13.2 % (ref 11.5–15.5)
WBC Count: 6.2 10*3/uL (ref 4.0–10.5)
nRBC: 0 % (ref 0.0–0.2)

## 2021-02-19 LAB — CMP (CANCER CENTER ONLY)
ALT: 16 U/L (ref 0–44)
AST: 16 U/L (ref 15–41)
Albumin: 4.2 g/dL (ref 3.5–5.0)
Alkaline Phosphatase: 50 U/L (ref 38–126)
Anion gap: 9 (ref 5–15)
BUN: 10 mg/dL (ref 8–23)
CO2: 25 mmol/L (ref 22–32)
Calcium: 9.5 mg/dL (ref 8.9–10.3)
Chloride: 105 mmol/L (ref 98–111)
Creatinine: 1.13 mg/dL (ref 0.61–1.24)
GFR, Estimated: >60 mL/min (ref >60)
Glucose, Bld: 144 mg/dL — ABNORMAL HIGH (ref 70–99)
Potassium: 4.1 mmol/L (ref 3.5–5.1)
Sodium: 139 mmol/L (ref 135–145)
Total Bilirubin: 0.6 mg/dL (ref 0.3–1.2)
Total Protein: 7.2 g/dL (ref 6.5–8.1)

## 2021-02-19 NOTE — Assessment & Plan Note (Signed)
Negative JAK2 V617F mutation. Erythropoeitin 18.6 (mildly elevated) 10/26/14 Recommendation: 1. Most likely related to underlying cardiac disease 2. Phlebotomy is not the standard of care for secondary polycythemia. Today's hemoglobin 14.9  Thrombocytopenia: Suspicious for low grade ITP (Large platelets on smear suggest increased destruction). Today is 112 which is slowly declining compared to previous levels. Immature platelet fraction: 9.7: Elevated suggesting ITP as the possible diagnosis. Today's platelet 112 No indication for treatment.  We will continue watchful monitoring and return to clinic in 1 year for follow-up

## 2021-02-19 NOTE — Telephone Encounter (Signed)
Please refill as per office routine med refill policy (all routine meds to be refilled for 3 mo or monthly (per pt preference) up to one year from last visit, then month to month grace period for 3 mo, then further med refills will have to be denied) ? ?

## 2021-02-20 ENCOUNTER — Ambulatory Visit (INDEPENDENT_AMBULATORY_CARE_PROVIDER_SITE_OTHER): Payer: Medicare Other

## 2021-02-20 DIAGNOSIS — I428 Other cardiomyopathies: Secondary | ICD-10-CM

## 2021-02-20 NOTE — Progress Notes (Signed)
Wound check appointment. Dermabond removed. Wound without redness or edema. Incision edges approximated, wound well healed. Normal device function. Subcutaneous ICD check in clinic. 0 untreated episodes; 0 treated episodes; 0 shocks delivered. Electrode impedance status okay- 70 ohms. No programming changes. Remaining longevity to ERI 100%.Patient educated about wound care, arm mobility, lifting restrictions, shock plan. Patient is enrolled in remote monitoring, next scheduled summary 05/13/21 ROV with Dr. Caryl Comes 05/19/21.

## 2021-02-20 NOTE — Patient Instructions (Signed)
   After Your ICD (Implantable Cardiac Defibrillator)    Monitor your defibrillator site for redness, swelling, and drainage. Call the device clinic at [phone] if you experience these symptoms or fever/chills.  Your incision was closed with Dermabond:  You may shower 1 day after your defibrillator implant and wash your incision with soap and water. Avoid lotions, ointments, or perfumes over your incision until it is well-healed.  You may use a hot tub or a pool after your wound check appointment if the incision is completely closed.  Do not lift, push or pull greater than 10 pounds with the affected arm until 6 weeks after your procedure. There are no other restrictions in arm movement after your wound check appointment.  Your ICD is MRI compatible.  Your ICD is designed to protect you from life threatening heart rhythms. Because of this, you may receive a shock.   1 shock with no symptoms:  Call the office during business hours. 1 shock with symptoms (chest pain, chest pressure, dizziness, lightheadedness, shortness of breath, overall feeling unwell):  Call 911. If you experience 2 or more shocks in 24 hours:  Call 911. If you receive a shock, you should not drive.  [place] DMV - no driving for 6 months if you receive appropriate therapy from your ICD.   ICD Alerts:  Some alerts are vibratory and others beep. These are NOT emergencies. Please call our office to let us know. If this occurs at night or on weekends, it can wait until the next business day. Send a remote transmission.  If your device is capable of reading fluid status (for heart failure), you will be offered monthly monitoring to review this with you.   Remote monitoring is used to monitor your ICD from home. This monitoring is scheduled every 91 days by our office. It allows [place] to keep an eye on the functioning of your device to ensure it is working properly. You will routinely see your Electrophysiologist annually (more  often if necessary).

## 2021-02-25 LAB — CUP PACEART INCLINIC DEVICE CHECK
Date Time Interrogation Session: 20220901153614
Implantable Lead Implant Date: 20160929
Implantable Lead Location: 753862
Implantable Lead Model: 3401
Implantable Pulse Generator Implant Date: 20220822
Pulse Gen Serial Number: 166274

## 2021-03-17 DIAGNOSIS — Z03818 Encounter for observation for suspected exposure to other biological agents ruled out: Secondary | ICD-10-CM | POA: Diagnosis not present

## 2021-03-17 DIAGNOSIS — Z20822 Contact with and (suspected) exposure to covid-19: Secondary | ICD-10-CM | POA: Diagnosis not present

## 2021-03-28 ENCOUNTER — Other Ambulatory Visit: Payer: Self-pay

## 2021-03-28 ENCOUNTER — Encounter: Payer: Self-pay | Admitting: Internal Medicine

## 2021-03-28 ENCOUNTER — Ambulatory Visit: Payer: Medicare Other | Admitting: Internal Medicine

## 2021-03-28 ENCOUNTER — Ambulatory Visit (INDEPENDENT_AMBULATORY_CARE_PROVIDER_SITE_OTHER): Payer: Medicare Other | Admitting: Internal Medicine

## 2021-03-28 VITALS — BP 118/70 | HR 84 | Temp 98.9°F | Resp 16 | Ht 70.0 in | Wt 210.4 lb

## 2021-03-28 DIAGNOSIS — I10 Essential (primary) hypertension: Secondary | ICD-10-CM | POA: Diagnosis not present

## 2021-03-28 DIAGNOSIS — E7800 Pure hypercholesterolemia, unspecified: Secondary | ICD-10-CM

## 2021-03-28 DIAGNOSIS — E559 Vitamin D deficiency, unspecified: Secondary | ICD-10-CM | POA: Diagnosis not present

## 2021-03-28 DIAGNOSIS — Z0001 Encounter for general adult medical examination with abnormal findings: Secondary | ICD-10-CM

## 2021-03-28 DIAGNOSIS — E538 Deficiency of other specified B group vitamins: Secondary | ICD-10-CM

## 2021-03-28 DIAGNOSIS — Z23 Encounter for immunization: Secondary | ICD-10-CM

## 2021-03-28 DIAGNOSIS — R7303 Prediabetes: Secondary | ICD-10-CM | POA: Diagnosis not present

## 2021-03-28 NOTE — Patient Instructions (Signed)
Please continue all other medications as before, and refills have been done if requested.  Please have the pharmacy call with any other refills you may need.  Please continue your efforts at being more active, low cholesterol diet, and weight control.  You are otherwise up to date with prevention measures today.  Please keep your appointments with your specialists as you may have planned  Please go to the LAB at the blood drawing area for the tests to be done - next wk at the [place] will be contacted by phone if any changes need to be made immediately.  Otherwise, you will receive a letter about your results with an explanation, but please check with MyChart first.  Please remember to sign up for MyChart if you have not done so, as this will be important to you in the future with finding out test results, communicating by private email, and scheduling acute appointments online when needed.  Please make an Appointment to return for your 1 year visit, or sooner if needed, with Lab testing by Appointment as well, to be done about 3-5 days before at the [place] (so this is for TWO appointments - please see the scheduling desk as you leave)  Due to the ongoing Covid 19 pandemic, our lab now requires an appointment for any labs done at our office.  If you need labs done and do not have an appointment, please call our office ahead of time to schedule before presenting to the lab for your testing.

## 2021-03-28 NOTE — Progress Notes (Signed)
Patient ID: Matthew Guerrero, male   DOB: 04/21/1956, 65 y.o.   MRN: [id number]         Chief Complaint:: wellness exam, low b12, preDM, hld, htn       HPI:  Matthew Guerrero is a 65 y.o. male here for wellness exam; declines tdap, covid booster, o/w up to date with preventive referrals and immunizations.  Pt denies chest pain, increased sob or doe, wheezing, orthopnea, PND, increased LE swelling, palpitations, dizziness or syncope.   Pt denies polydipsia, polyuria, or new focal neuro s/s.   Pt denies fever, wt loss, night sweats, loss of appetite, or other constitutional symptoms  No other new complaints  Not taking b12,  trying to follow chol diet.     Wt Readings from Last 3 Encounters:  03/28/21 210 lb 6.4 oz (95.4 kg)  02/19/21 201 lb 9.6 oz (91.4 kg)  02/10/21 199 lb (90.3 kg)   BP Readings from Last 3 Encounters:  03/28/21 118/70  02/19/21 107/69  02/10/21 113/62   Immunization History  Administered Date(s) Administered   Fluad Quad(high Dose 65+) 04/04/2020, 03/28/2021   Influenza Split 04/28/2012   Influenza Whole 03/22/2009, 04/18/2010   Influenza,inj,Quad PF,6+ Mos 02/25/2014, 02/28/2015, 03/29/2018, 03/09/2019   Influenza-Unspecified 02/10/2017, 03/29/2018, 03/09/2019   Moderna Sars-Covid-2 Vaccination 09/08/2019, 10/09/2019, 04/23/2020   Pneumococcal Conjugate-13 04/04/2020   Pneumococcal Polysaccharide-23 02/28/2015   Td 07/01/2009   Zoster Recombinat (Shingrix) 03/22/2016, 05/22/2016   There are no preventive care reminders to display for this patient.     Past Medical History:  Diagnosis Date   ALLERGIC RHINITIS    Asthma    B12 deficiency 04/06/2020   Cervical radiculitis    CHF (congestive heart failure) (HCC)    Chronic systolic heart failure ([place]) 02/28/2015   GERD (gastroesophageal reflux disease)    GOUT    Hyperglycemia 08/28/2014   HYPERLIPIDEMIA    HYPERTENSION    Lateral epicondylitis of right elbow    Nonischemic cardiomyopathy ([place]) 09/28/2014    Obesity (BMI 30-39.9) 06/06/2015   S/P cardiac cath wjith normal coronary arteries  09/28/2014   Sleep apnea    Thrombocytopenia ([place]) 04/06/2020   Past Surgical History:  Procedure Laterality Date   COLONOSCOPY     COLONOSCOPY WITH PROPOFOL N/A 06/17/2017   Procedure: COLONOSCOPY WITH PROPOFOL;  Surgeon: Milus Banister, MD;  Location: [facility];  Service: Endoscopy;  Laterality: N/A;   EP IMPLANTABLE DEVICE N/A 03/21/2015   Procedure: SubQ ICD Implant;  Surgeon: Deboraha Sprang, MD;  Location: [facility];  Service: Cardiovascular;  Laterality: N/A;   LEFT AND RIGHT HEART CATHETERIZATION WITH CORONARY ANGIOGRAM N/A 09/27/2014   Procedure: LEFT AND RIGHT HEART CATHETERIZATION WITH CORONARY ANGIOGRAM;  Surgeon: Belva Crome, MD;  Location: [facility] LAB;  Service: Cardiovascular;  Laterality: N/A;   SUBQ ICD  03/21/2015   SUBQ ICD IMPLANT N/A 02/10/2021   Procedure: SUBQ ICD IMPLANT;  Surgeon: Deboraha Sprang, MD;  Location: [facility];  Service: Cardiovascular;  Laterality: N/A;    reports that he has never smoked. He has never used smokeless tobacco. He reports that he does not drink alcohol and does not use drugs. family history includes Aneurysm (age of onset: 42) in his sister; Diabetes in his father; Healthy in his brother, brother, and brother; Heart attack (age of onset: 24) in his father; Heart disease in his father; Hypertension in his sister; Lung cancer in his mother. No Known Allergies Current Outpatient Medications on File  Prior to Visit  Medication Sig Dispense Refill   aspirin EC 81 MG tablet Take 81 mg by mouth daily. Swallow whole.     carvedilol (COREG) 25 MG tablet TAKE 1 TABLET BY MOUTH  TWICE DAILY WITH MEALS 180 tablet 0   colchicine 0.6 MG tablet TAKE 1 TABLET BY MOUTH  DAILY 90 tablet 3   ENTRESTO 97-103 MG TAKE 1 TABLET BY MOUTH  TWICE DAILY 180 tablet 3   furosemide (LASIX) 40 MG tablet TAKE 1 TABLET BY MOUTH  DAILY 90 tablet 3   pantoprazole  (PROTONIX) 40 MG tablet TAKE 1 TABLET BY MOUTH  DAILY 90 tablet 0   pravastatin (PRAVACHOL) 40 MG tablet TAKE 1 TABLET BY MOUTH IN  THE EVENING 90 tablet 3   spironolactone (ALDACTONE) 25 MG tablet Take 1 tablet (25 mg total) by mouth daily. (Patient taking differently: Take 12.5 mg by mouth daily.) 90 tablet 3   vitamin B-12 (CYANOCOBALAMIN) 1000 MCG tablet Take 1 tablet (1,000 mcg total) by mouth daily. 90 tablet 3   No current facility-administered medications on file prior to visit.        ROS:  All others reviewed and negative.  Objective        PE:  BP 118/70 (BP Location: Left Arm, Patient Position: Sitting, Cuff Size: Large)   Pulse 84   Temp 98.9 F (37.2 C) (Oral)   Resp 16   Ht 5\' 10"  (1.778 m)   Wt 210 lb 6.4 oz (95.4 kg)   BMI 30.19 kg/m                 Constitutional: Pt appears in NAD               HENT: Head: NCAT.                Right Ear: External ear normal.                 Left Ear: External ear normal.                Eyes: . Pupils are equal, round, and reactive to light. Conjunctivae and EOM are normal               Nose: without d/c or deformity               Neck: Neck supple. Gross normal ROM               Cardiovascular: Normal rate and regular rhythm.                 Pulmonary/Chest: Effort normal and breath sounds without rales or wheezing.                Abd:  Soft, NT, ND, + BS, no organomegaly               Neurological: Pt is alert. At baseline orientation, motor grossly intact               Skin: Skin is warm. No rashes, no other new lesions, LE edema - none               Psychiatric: Pt behavior is normal without agitation   Micro: none  Cardiac tracings I have personally interpreted today:  none  Pertinent Radiological findings (summarize): none   Lab Results  Component Value Date   WBC 6.2 02/19/2021   HGB 15.0 02/19/2021   HCT 45.2 02/19/2021  PLT 151 02/19/2021   GLUCOSE 144 (H) 02/19/2021   CHOL 139 04/05/2020   TRIG 68.0  04/05/2020   HDL 39.40 04/05/2020   LDLDIRECT 105.8 06/18/2010   LDLCALC 86 04/05/2020   ALT 16 02/19/2021   AST 16 02/19/2021   NA 139 02/19/2021   K 4.1 02/19/2021   CL 105 02/19/2021   CREATININE 1.13 02/19/2021   BUN 10 02/19/2021   CO2 25 02/19/2021   TSH 0.42 04/05/2020   PSA 0.89 04/05/2020   INR 1.2 (H) 03/15/2015   HGBA1C 6.1 04/05/2020   Assessment/Plan:  Matthew Guerrero is a 65 y.o. Black or African American [2] male with  has a past medical history of ALLERGIC RHINITIS, Asthma, B12 deficiency (04/06/2020), Cervical radiculitis, CHF (congestive heart failure) ([place]), Chronic systolic heart failure ([place]) (02/28/2015), GERD (gastroesophageal reflux disease), GOUT, Hyperglycemia (08/28/2014), HYPERLIPIDEMIA, HYPERTENSION, Lateral epicondylitis of right elbow, Nonischemic cardiomyopathy ([place]) (09/28/2014), Obesity (BMI 30-39.9) (06/06/2015), S/P cardiac cath wjith normal coronary arteries  (09/28/2014), Sleep apnea, and Thrombocytopenia ([place]) (04/06/2020).  Encounter for well adult exam with abnormal findings Age and sex appropriate education and counseling updated with regular exercise and diet Referrals for preventative services - none needed Immunizations addressed - declines tdap and covid booster Smoking counseling  - none needed Evidence for depression or other mood disorder - none significant Most recent labs reviewed. I have personally reviewed and have noted: 1) the patient's medical and social history 2) The patient's current medications and supplements 3) The patient's height, weight, and BMI have been recorded in the chart   Prediabetes Lab Results  Component Value Date   HGBA1C 6.1 04/05/2020   Stable, pt to continue current medical treatment  - diet, for f/u a1c   B12 deficiency Lab Results  Component Value Date   VITAMINB12 113 (L) 04/05/2020   Low, to start oral replacement - b12 1000 mcg qd   Hyperlipidemia Lab Results  Component Value Date    LDLCALC 86 04/05/2020   Uncontrolled, goal ldl < 7, pt to continue current statin pravachol 40 for now, consider change on f/u lipids   Essential hypertension BP Readings from Last 3 Encounters:  03/28/21 118/70  02/19/21 107/69  02/10/21 113/62   Stable, pt to continue medical treatment coreg, enteresto, aldactone  Followup: Return in about 1 year (around 03/28/2022).  Cathlean Cower, MD 03/29/2021 12:24 PM [facility]

## 2021-03-29 ENCOUNTER — Encounter: Payer: Self-pay | Admitting: Internal Medicine

## 2021-03-29 NOTE — Assessment & Plan Note (Signed)
Lab Results  Component Value Date   VITAMINB12 113 (L) 04/05/2020   Low, to start oral replacement - b12 1000 mcg qd

## 2021-03-29 NOTE — Assessment & Plan Note (Signed)
Lab Results  Component Value Date   LDLCALC 86 04/05/2020   Uncontrolled, goal ldl < 7, pt to continue current statin pravachol 40 for now, consider change on f/u lipids

## 2021-03-29 NOTE — Assessment & Plan Note (Signed)
Age and sex appropriate education and counseling updated with regular exercise and diet Referrals for preventative services - none needed Immunizations addressed - declines tdap and covid booster Smoking counseling  - none needed Evidence for depression or other mood disorder - none significant Most recent labs reviewed. I have personally reviewed and have noted: 1) the patient's medical and social history 2) The patient's current medications and supplements 3) The patient's height, weight, and BMI have been recorded in the chart  

## 2021-03-29 NOTE — Addendum Note (Signed)
Addended by: Biagio Borg on: 03/29/2021 12:26 PM   Modules accepted: Orders

## 2021-03-29 NOTE — Assessment & Plan Note (Signed)
BP Readings from Last 3 Encounters:  03/28/21 118/70  02/19/21 107/69  02/10/21 113/62   Stable, pt to continue medical treatment coreg, enteresto, aldactone

## 2021-03-29 NOTE — Assessment & Plan Note (Signed)
Lab Results  Component Value Date   HGBA1C 6.1 04/05/2020   Stable, pt to continue current medical treatment  - diet, for f/u a1c

## 2021-03-31 DIAGNOSIS — G4733 Obstructive sleep apnea (adult) (pediatric): Secondary | ICD-10-CM | POA: Diagnosis not present

## 2021-04-01 ENCOUNTER — Other Ambulatory Visit: Payer: Self-pay

## 2021-04-01 ENCOUNTER — Encounter: Payer: Self-pay | Admitting: Internal Medicine

## 2021-04-01 ENCOUNTER — Encounter: Payer: Self-pay | Admitting: Cardiology

## 2021-04-01 ENCOUNTER — Other Ambulatory Visit (INDEPENDENT_AMBULATORY_CARE_PROVIDER_SITE_OTHER): Payer: Medicare Other

## 2021-04-01 ENCOUNTER — Ambulatory Visit (INDEPENDENT_AMBULATORY_CARE_PROVIDER_SITE_OTHER): Payer: Medicare Other | Admitting: Cardiology

## 2021-04-01 VITALS — BP 112/68 | HR 90 | Ht 70.0 in | Wt 208.8 lb

## 2021-04-01 DIAGNOSIS — E538 Deficiency of other specified B group vitamins: Secondary | ICD-10-CM | POA: Diagnosis not present

## 2021-04-01 DIAGNOSIS — E559 Vitamin D deficiency, unspecified: Secondary | ICD-10-CM | POA: Diagnosis not present

## 2021-04-01 DIAGNOSIS — Z0001 Encounter for general adult medical examination with abnormal findings: Secondary | ICD-10-CM | POA: Diagnosis not present

## 2021-04-01 DIAGNOSIS — E7800 Pure hypercholesterolemia, unspecified: Secondary | ICD-10-CM | POA: Diagnosis not present

## 2021-04-01 DIAGNOSIS — G4733 Obstructive sleep apnea (adult) (pediatric): Secondary | ICD-10-CM | POA: Diagnosis not present

## 2021-04-01 DIAGNOSIS — I10 Essential (primary) hypertension: Secondary | ICD-10-CM | POA: Diagnosis not present

## 2021-04-01 DIAGNOSIS — R7303 Prediabetes: Secondary | ICD-10-CM | POA: Diagnosis not present

## 2021-04-01 LAB — CBC WITH DIFFERENTIAL/PLATELET
Basophils Absolute: 0 10*3/uL (ref 0.0–0.1)
Basophils Relative: 0.4 % (ref 0.0–3.0)
Eosinophils Absolute: 0.2 10*3/uL (ref 0.0–0.7)
Eosinophils Relative: 3.5 % (ref 0.0–5.0)
HCT: 46.9 % (ref 39.0–52.0)
Hemoglobin: 15.2 g/dL (ref 13.0–17.0)
Lymphocytes Relative: 41.1 % (ref 12.0–46.0)
Lymphs Abs: 2.3 10*3/uL (ref 0.7–4.0)
MCHC: 32.4 g/dL (ref 30.0–36.0)
MCV: 87.8 fl (ref 78.0–100.0)
Monocytes Absolute: 0.5 10*3/uL (ref 0.1–1.0)
Monocytes Relative: 8.1 % (ref 3.0–12.0)
Neutro Abs: 2.7 10*3/uL (ref 1.4–7.7)
Neutrophils Relative %: 46.9 % (ref 43.0–77.0)
Platelets: 124 10*3/uL — ABNORMAL LOW (ref 150.0–400.0)
RBC: 5.34 Mil/uL (ref 4.22–5.81)
RDW: 14 % (ref 11.5–15.5)
WBC: 5.7 10*3/uL (ref 4.0–10.5)

## 2021-04-01 LAB — LIPID PANEL
Cholesterol: 129 mg/dL (ref 0–200)
HDL: 35.2 mg/dL — ABNORMAL LOW (ref >39.00)
NonHDL: 93.95
Total CHOL/HDL Ratio: 4
Triglycerides: 228 mg/dL — ABNORMAL HIGH (ref 0.0–149.0)
VLDL: 45.6 mg/dL — ABNORMAL HIGH (ref 0.0–40.0)

## 2021-04-01 LAB — HEPATIC FUNCTION PANEL
ALT: 16 U/L (ref 0–53)
AST: 16 U/L (ref 0–37)
Albumin: 4.5 g/dL (ref 3.5–5.2)
Alkaline Phosphatase: 49 U/L (ref 39–117)
Bilirubin, Direct: 0.1 mg/dL (ref 0.0–0.3)
Total Bilirubin: 0.6 mg/dL (ref 0.2–1.2)
Total Protein: 7 g/dL (ref 6.0–8.3)

## 2021-04-01 LAB — BASIC METABOLIC PANEL
BUN: 13 mg/dL (ref 6–23)
CO2: 27 mEq/L (ref 19–32)
Calcium: 9.7 mg/dL (ref 8.4–10.5)
Chloride: 101 mEq/L (ref 96–112)
Creatinine, Ser: 1.15 mg/dL (ref 0.40–1.50)
GFR: 66.28 mL/min (ref >60.00)
Glucose, Bld: 125 mg/dL — ABNORMAL HIGH (ref 70–99)
Potassium: 4.3 mEq/L (ref 3.5–5.1)
Sodium: 137 mEq/L (ref 135–145)

## 2021-04-01 LAB — PSA: PSA: 0.87 ng/mL (ref 0.10–4.00)

## 2021-04-01 LAB — URINALYSIS, ROUTINE W REFLEX MICROSCOPIC
Bilirubin Urine: NEGATIVE
Hgb urine dipstick: NEGATIVE
Ketones, ur: NEGATIVE
Leukocytes,Ua: NEGATIVE
Nitrite: NEGATIVE
RBC / HPF: NONE SEEN (ref 0–?)
Specific Gravity, Urine: <=1.005 — AB (ref 1.000–1.030)
Total Protein, Urine: NEGATIVE
Urine Glucose: NEGATIVE
Urobilinogen, UA: 0.2 (ref 0.0–1.0)
pH: 6 (ref 5.0–8.0)

## 2021-04-01 LAB — TSH: TSH: 2.61 u[IU]/mL (ref 0.35–5.50)

## 2021-04-01 LAB — VITAMIN D 25 HYDROXY (VIT D DEFICIENCY, FRACTURES): VITD: 30.92 ng/mL (ref 30.00–100.00)

## 2021-04-01 LAB — LDL CHOLESTEROL, DIRECT: Direct LDL: 73 mg/dL

## 2021-04-01 LAB — HEMOGLOBIN A1C: Hgb A1c MFr Bld: 6.4 % (ref 4.6–6.5)

## 2021-04-01 LAB — VITAMIN B12: Vitamin B-12: 424 pg/mL (ref 211–911)

## 2021-04-01 NOTE — Progress Notes (Signed)
Date:  04/01/2021   ID:  Jamin Panther, DOB Oct 06, 1955, MRN [id number]  PCP:  Biagio Borg, MD  Cardiologist:  Pixie Casino, MD  Sleep Medicine:  Fransico Him, MD Electrophysiologist:  [name], MD   Chief Complaint:  OSA  History of Present Illness:    Matthew Guerrero is a 65 y.o. male with a hx of moderate OSA with an AHI of 23/hr and mild to moderate snoring and associated nocturnal hypoxemia with his respiratory events as low as 71%.  He was titrated to 9 cm H2O.    He is doing well with his CPAP device and thinks that he has gotten used to it.  He tolerates the mask and feels the pressure is adequate.  Since going on CPAP hd feels rested in the am and has no significant daytime sleepiness.  He denies any significant mouth or nasal dryness or nasal congestion.  He does not think that he snores.     Prior CV studies:   The following studies were reviewed today:  PAP compliance data  Past Medical History:  Diagnosis Date   ALLERGIC RHINITIS    Asthma    B12 deficiency 04/06/2020   Cervical radiculitis    CHF (congestive heart failure) (HCC)    Chronic systolic heart failure ([place]) 02/28/2015   GERD (gastroesophageal reflux disease)    GOUT    Hyperglycemia 08/28/2014   HYPERLIPIDEMIA    HYPERTENSION    Lateral epicondylitis of right elbow    Nonischemic cardiomyopathy ([place]) 09/28/2014   Obesity (BMI 30-39.9) 06/06/2015   S/P cardiac cath wjith normal coronary arteries  09/28/2014   Sleep apnea    Thrombocytopenia ([place]) 04/06/2020   Past Surgical History:  Procedure Laterality Date   COLONOSCOPY     COLONOSCOPY WITH PROPOFOL N/A 06/17/2017   Procedure: COLONOSCOPY WITH PROPOFOL;  Surgeon: Milus Banister, MD;  Location: [facility];  Service: Endoscopy;  Laterality: N/A;   EP IMPLANTABLE DEVICE N/A 03/21/2015   Procedure: SubQ ICD Implant;  Surgeon: Deboraha Sprang, MD;  Location: [place] [facility] LAB;  Service: Cardiovascular;  Laterality: N/A;   LEFT AND RIGHT  HEART CATHETERIZATION WITH CORONARY ANGIOGRAM N/A 09/27/2014   Procedure: LEFT AND RIGHT HEART CATHETERIZATION WITH CORONARY ANGIOGRAM;  Surgeon: Belva Crome, MD;  Location: [facility] CATH LAB;  Service: Cardiovascular;  Laterality: N/A;   SUBQ ICD  03/21/2015   SUBQ ICD IMPLANT N/A 02/10/2021   Procedure: SUBQ ICD IMPLANT;  Surgeon: Deboraha Sprang, MD;  Location: [place] [facility];  Service: Cardiovascular;  Laterality: N/A;     Current Meds  Medication Sig   aspirin EC 81 MG tablet Take 81 mg by mouth daily. Swallow whole.   carvedilol (COREG) 25 MG tablet TAKE 1 TABLET BY MOUTH  TWICE DAILY WITH MEALS   colchicine 0.6 MG tablet TAKE 1 TABLET BY MOUTH  DAILY   ENTRESTO 97-103 MG TAKE 1 TABLET BY MOUTH  TWICE DAILY   furosemide (LASIX) 40 MG tablet TAKE 1 TABLET BY MOUTH  DAILY   pantoprazole (PROTONIX) 40 MG tablet TAKE 1 TABLET BY MOUTH  DAILY   pravastatin (PRAVACHOL) 40 MG tablet TAKE 1 TABLET BY MOUTH IN  THE EVENING   spironolactone (ALDACTONE) 25 MG tablet Take 1 tablet (25 mg total) by mouth daily. (Patient taking differently: Take 12.5 mg by mouth daily.)   vitamin B-12 (CYANOCOBALAMIN) 1000 MCG tablet Take 1 tablet (1,000 mcg total) by mouth daily.     Allergies:  Patient has no known allergies.   Social History   Tobacco Use   Smoking status: Never   Smokeless tobacco: Never  Vaping Use   Vaping Use: Never used  Substance Use Topics   Alcohol use: No    Alcohol/week: 0.0 standard drinks   Drug use: No     Family Hx: The patient's family history includes Aneurysm (age of onset: 8) in his sister; Diabetes in his father; Healthy in his brother, brother, and brother; Heart attack (age of onset: 30) in his father; Heart disease in his father; Hypertension in his sister; Lung cancer in his mother. There is no history of Colon cancer, Esophageal cancer, Rectal cancer, or Stomach cancer.  ROS:   Please see the history of present illness.     All other systems reviewed and are  negative.   Labs/Other Tests and Data Reviewed:    Recent Labs: 04/01/2021: ALT 16; BUN 13; Creatinine, Ser 1.15; Hemoglobin 15.2; Platelets 124.0; Potassium 4.3; Sodium 137; TSH 2.61   Recent Lipid Panel Lab Results  Component Value Date/Time   CHOL 129 04/01/2021 07:47 AM   CHOL 134 10/21/2016 02:38 PM   TRIG 228.0 (H) 04/01/2021 07:47 AM   HDL 35.20 (L) 04/01/2021 07:47 AM   HDL 35 (L) 10/21/2016 02:38 PM   CHOLHDL 4 04/01/2021 07:47 AM   LDLCALC 86 04/05/2020 09:07 AM   LDLCALC 68 10/21/2016 02:38 PM   LDLDIRECT 73.0 04/01/2021 07:47 AM    Wt Readings from Last 3 Encounters:  04/01/21 208 lb 12.8 oz (94.7 kg)  03/28/21 210 lb 6.4 oz (95.4 kg)  02/19/21 201 lb 9.6 oz (91.4 kg)     Objective:    Vital Signs:  BP 112/68   Pulse 90   Ht 5\' 10"  (1.778 m)   Wt 208 lb 12.8 oz (94.7 kg)   SpO2 97%   BMI 29.96 kg/m  GEN: Well nourished, well developed in no acute distress HEENT: Normal NECK: No JVD; No carotid bruits LYMPHATICS: No lymphadenopathy CARDIAC:RRR, no murmurs, rubs, gallops RESPIRATORY:  Clear to auscultation without rales, wheezing or rhonchi  ABDOMEN: Soft, non-tender, non-distended MUSCULOSKELETAL:  No edema; No deformity  SKIN: Warm and dry NEUROLOGIC:  Alert and oriented x 3 PSYCHIATRIC:  Normal affect   ASSESSMENT & PLAN:    1.  OSA - The patient is tolerating PAP therapy well without any problems. The PAP download performed by his DME was personally reviewed and interpreted by me today and showed an AHI of 0.7/hr on 9 cm H2O with 47% compliance in using more than 4 hours nightly.  The patient has been using and benefiting from PAP use and will continue to benefit from therapy.  -I encouraged him to be more compliant with his device   2.  Hypertension -Bp is adequately controlled on exam today -continue prescription drug management with carvedilol 25mg  BID, Entresto 87-103mg  BID and spiro 12.5mg  daily with PRN refills -I have personally reviewed  and interpreted outside labs performed by patient's PCP which showed SCr 1.13, K+ 4.1 in Aug 2022 and TSH 0.42 in Oct 2021   Medication Adjustments/Labs and Tests Ordered: Current medicines are reviewed at length with the patient today.  Concerns regarding medicines are outlined above.  Tests Ordered: No orders of the defined types were placed in this encounter.  Medication Changes: No orders of the defined types were placed in this encounter.   Disposition:  Follow up in 1 year(s)  Signed, Fransico Him, MD  04/01/2021  2:39 PM    [facility]

## 2021-04-01 NOTE — Patient Instructions (Signed)
Medication Instructions:  Your physician recommends that you continue on your current medications as directed. Please refer to the Current Medication list given to you today.  *If you need a refill on your cardiac medications before your next appointment, please call your pharmacy*   Follow-Up: At [facility], you and your health needs are our priority.  As part of our continuing mission to provide you with exceptional heart care, we have created designated Provider Care Teams.  These Care Teams include your primary Cardiologist (physician) and Advanced Practice Providers (APPs -  Physician Assistants and Nurse Practitioners) who all work together to provide you with the care you need, when you need it.  Your next appointment:   1 year  The format for your next appointment:   In-Person  Provider:   Fransico Him, MD

## 2021-04-14 DIAGNOSIS — I428 Other cardiomyopathies: Secondary | ICD-10-CM

## 2021-04-15 MED ORDER — SPIRONOLACTONE 25 MG PO TABS: 12.5000 mg | ORAL_TABLET | Freq: Every day | ORAL | 3 refills | Status: DC

## 2021-04-15 MED ORDER — ENTRESTO 97-103 MG PO TABS: 1.0000 | ORAL_TABLET | Freq: Two times a day (BID) | ORAL | 3 refills | Status: DC

## 2021-05-13 ENCOUNTER — Ambulatory Visit (INDEPENDENT_AMBULATORY_CARE_PROVIDER_SITE_OTHER): Payer: Medicare Other

## 2021-05-13 DIAGNOSIS — I428 Other cardiomyopathies: Secondary | ICD-10-CM | POA: Diagnosis not present

## 2021-05-13 LAB — CUP PACEART REMOTE DEVICE CHECK
Battery Remaining Percentage: 99 %
Date Time Interrogation Session: 20221122070200
Implantable Lead Implant Date: 20160929
Implantable Lead Location: 753862
Implantable Lead Model: 3401
Implantable Pulse Generator Implant Date: 20220822
Pulse Gen Serial Number: 166274

## 2021-05-14 ENCOUNTER — Other Ambulatory Visit: Payer: Self-pay | Admitting: Internal Medicine

## 2021-05-15 NOTE — Telephone Encounter (Signed)
Please refill as per office routine med refill policy (all routine meds to be refilled for 3 mo or monthly (per pt preference) up to one year from last visit, then month to month grace period for 3 mo, then further med refills will have to be denied) ? ?

## 2021-05-19 ENCOUNTER — Ambulatory Visit (INDEPENDENT_AMBULATORY_CARE_PROVIDER_SITE_OTHER): Payer: Medicare Other | Admitting: Internal Medicine

## 2021-05-19 ENCOUNTER — Encounter: Payer: Self-pay | Admitting: Internal Medicine

## 2021-05-19 ENCOUNTER — Other Ambulatory Visit: Payer: Self-pay

## 2021-05-19 VITALS — BP 118/70 | HR 88 | Ht 70.0 in | Wt 212.2 lb

## 2021-05-19 DIAGNOSIS — Z95810 Presence of automatic (implantable) cardiac defibrillator: Secondary | ICD-10-CM

## 2021-05-19 DIAGNOSIS — I428 Other cardiomyopathies: Secondary | ICD-10-CM | POA: Diagnosis not present

## 2021-05-19 DIAGNOSIS — I5022 Chronic systolic (congestive) heart failure: Secondary | ICD-10-CM | POA: Diagnosis not present

## 2021-05-19 LAB — CUP PACEART INCLINIC DEVICE CHECK
Date Time Interrogation Session: 20221128172359
Implantable Lead Implant Date: 20160929
Implantable Lead Location: 753862
Implantable Lead Model: 3401
Implantable Pulse Generator Implant Date: 20220822
Pulse Gen Serial Number: 166274

## 2021-05-19 MED ORDER — DAPAGLIFLOZIN PROPANEDIOL 10 MG PO TABS: 10.0000 mg | ORAL_TABLET | Freq: Every day | ORAL | 3 refills | Status: DC

## 2021-05-19 NOTE — Progress Notes (Signed)
ELECTROPHYSIOLOGY OFFICE NOTE  Patient ID: Matthew Guerrero, MRN: [id number], DOB/AGE: 28-Dec-1955 65 y.o. Admit date: (Not on file) Date of Consult: 05/19/2021  Primary Physician: Biagio Borg, MD Primary Cardiologist: [facility] Chief Complaint:  ICD   HPI Matthew Guerrero is a 65 y.o. male Seen in follow-up for S ICD implanted for primary prevention 9/16 in the setting of NICM initially presenting 4/16. ; Suffered early battery depletion and underwent Gen change 8/22.  Today, he is doing well. Since his pacemaker implantation, his shortness of breath has improved. He takes his time going up and down the stairs in his home. The patient denies chest pain, orthopnea or peripheral edema. There have been no palpitations, lightheadedness or syncope. Some SOB noted in limiting activities  He sleeps with 2 pillows. In the past 2 nights, he wakes u pin the middle of the night because he cannot breath. His CPAP machine notifies him that his mask is not creating a good seal. However, he is unsure how to fix this.      DATE TEST EF   4/16 LHC  NO obstructive disease  8/16 echo 15 %   8/17 echo 15 %   8/18 Echo 25-30%   9/21 Echo 35-40%    Date Cr K HGB   10/18 1.08 3.9     10/22  1.15 4.3 15.2     He has had TWOS in the secondary vector  now programmed in alternate    Past Medical History:  Diagnosis Date   ALLERGIC RHINITIS    Asthma    B12 deficiency 04/06/2020   Cervical radiculitis    CHF (congestive heart failure) (HCC)    Chronic systolic heart failure ([place]) 02/28/2015   GERD (gastroesophageal reflux disease)    GOUT    Hyperglycemia 08/28/2014   HYPERLIPIDEMIA    HYPERTENSION    Lateral epicondylitis of right elbow    Nonischemic cardiomyopathy ([place]) 09/28/2014   Obesity (BMI 30-39.9) 06/06/2015   S/P cardiac cath wjith normal coronary arteries  09/28/2014   Sleep apnea    Thrombocytopenia ([place]) 04/06/2020      Surgical History:  Past Surgical History:  Procedure Laterality  Date   COLONOSCOPY     COLONOSCOPY WITH PROPOFOL N/A 06/17/2017   Procedure: COLONOSCOPY WITH PROPOFOL;  Surgeon: Milus Banister, MD;  Location: [facility];  Service: Endoscopy;  Laterality: N/A;   EP IMPLANTABLE DEVICE N/A 03/21/2015   Procedure: SubQ ICD Implant;  Surgeon: Deboraha Sprang, MD;  Location: [place] CV LAB;  Service: Cardiovascular;  Laterality: N/A;   LEFT AND RIGHT HEART CATHETERIZATION WITH CORONARY ANGIOGRAM N/A 09/27/2014   Procedure: LEFT AND RIGHT HEART CATHETERIZATION WITH CORONARY ANGIOGRAM;  Surgeon: Belva Crome, MD;  Location: [facility] CATH LAB;  Service: Cardiovascular;  Laterality: N/A;   SUBQ ICD  03/21/2015   SUBQ ICD IMPLANT N/A 02/10/2021   Procedure: SUBQ ICD IMPLANT;  Surgeon: Deboraha Sprang, MD;  Location: [place] CV LAB;  Service: Cardiovascular;  Laterality: N/A;   Allergies as of 05/19/2021   No Known Allergies      Medication List     aspirin EC 81 MG tablet Take 81 mg by mouth daily. Swallow whole.   carvedilol 25 MG tablet Commonly known as: COREG TAKE 1 TABLET BY MOUTH  TWICE DAILY WITH MEALS   colchicine 0.6 MG tablet TAKE 1 TABLET BY MOUTH  DAILY   dapagliflozin propanediol 10 MG Tabs tablet Commonly known as: FARXIGA Take 1 tablet (  10 mg total) by mouth daily before breakfast. Started by: [name], MD   Entresto 97-103 MG Generic drug: sacubitril-valsartan Take 1 tablet by mouth 2 (two) times daily.   furosemide 40 MG tablet Commonly known as: LASIX TAKE 1 TABLET BY MOUTH  DAILY   pantoprazole 40 MG tablet Commonly known as: PROTONIX TAKE 1 TABLET BY MOUTH  DAILY   pravastatin 40 MG tablet Commonly known as: PRAVACHOL TAKE 1 TABLET BY MOUTH IN  THE EVENING   spironolactone 25 MG tablet Commonly known as: ALDACTONE Take 0.5 tablets (12.5 mg total) by mouth daily.   vitamin B-12 1000 MCG tablet Commonly known as: CYANOCOBALAMIN Take 1 tablet (1,000 mcg total) by mouth daily.      Allergies: No Known  Allergies     ROS:  Please see the history of present illness.     All other systems reviewed and negative.    Physical Exam: BP 118/70   Pulse 88   Ht 5\' 10"  (1.778 m)   Wt 212 lb 3.2 oz (96.3 kg)   SpO2 98%   BMI 30.45 kg/m   Well developed and well nourished in no acute distress HENT normal Neck supple with JVP-flat Clear Device pocket well healed; without hematoma or erythema.  There is no tethering  Regular rate and rhythm, no gallop No murmur Abd-soft with active BS No Clubbing cyanosis no edema Skin-warm and dry A & Oriented  Grossly normal sensory and motor function  ECG sinus @ 88 16/11/37         Assessment and Plan:  Nonischemic cardiomyopathy  Congestive heart failure-colonic-class II systolic  ICD SubQ      Hypertension    High Risk Medication Surveillance  Euvolemic, continue furosemide 40 and aldactone 12.5  With cardiomyopathy continue entresto 97/103; carvedilol 25 bid aldactone 12.5   and will begin with ongoing dyspnea farxiga 10  arrange f/u with [facility] MD PA in the next month   BP well controlled on the above medications  ICD site well healed       I,Mykaella Javier,acting as a scribe for [name], MD.,have documented all relevant documentation on the behalf of [name], MD,as directed by  [name], MD while in the presence of [name], MD.  I, [name], MD, have reviewed all documentation for this visit. The documentation on 05/19/21 for the exam, diagnosis, procedures, and orders are all accurate and complete.    Mykaella Garlon Hatchet

## 2021-05-19 NOTE — Progress Notes (Signed)
ELECTROPHYSIOLOGY OFFICE NOTE  Patient ID: Matthew Guerrero, MRN: [id number], DOB/AGE: [birth year] 65 y.o. Admit date: (Not on file) Date of Consult: 05/19/2021  Primary Physician: Biagio Borg, MD Primary Cardiologist: [facility] Chief Complaint:  ICD   HPI Matthew Guerrero is a 65 y.o. male Seen in follow-up for S ICD implanted for primary prevention 9/16 in the setting of NICM initially presenting 4/16. ; Suffered early battery depletion and underwent Gen change 8/22.   DATE TEST EF   4/16 LHC  NO obstructive disease  8/16 echo 15 %   8/17 echo 15 %   8/18 Echo 25-30%   9/21 Echo 35-40%    Date Cr K HGB   10/18 1.08 3.9     10/22  1.15 4.3 15.2     He has had TWOS in the secondary vector  now programmed in alternate        Past Medical History:  Diagnosis Date   ALLERGIC RHINITIS    Asthma    B12 deficiency 04/06/2020   Cervical radiculitis    CHF (congestive heart failure) (HCC)    Chronic systolic heart failure ([place]) 02/28/2015   GERD (gastroesophageal reflux disease)    GOUT    Hyperglycemia 08/28/2014   HYPERLIPIDEMIA    HYPERTENSION    Lateral epicondylitis of right elbow    Nonischemic cardiomyopathy ([place]) 09/28/2014   Obesity (BMI 30-39.9) 06/06/2015   S/P cardiac cath wjith normal coronary arteries  09/28/2014   Sleep apnea    Thrombocytopenia ([place]) 04/06/2020      Surgical History:  Past Surgical History:  Procedure Laterality Date   COLONOSCOPY     COLONOSCOPY WITH PROPOFOL N/A 06/17/2017   Procedure: COLONOSCOPY WITH PROPOFOL;  Surgeon: Milus Banister, MD;  Location: [facility];  Service: Endoscopy;  Laterality: N/A;   EP IMPLANTABLE DEVICE N/A 03/21/2015   Procedure: SubQ ICD Implant;  Surgeon: Deboraha Sprang, MD;  Location: [place] CV LAB;  Service: Cardiovascular;  Laterality: N/A;   LEFT AND RIGHT HEART CATHETERIZATION WITH CORONARY ANGIOGRAM N/A 09/27/2014   Procedure: LEFT AND RIGHT HEART CATHETERIZATION WITH CORONARY ANGIOGRAM;  Surgeon:  Belva Crome, MD;  Location: [facility] CATH LAB;  Service: Cardiovascular;  Laterality: N/A;   SUBQ ICD  03/21/2015   SUBQ ICD IMPLANT N/A 02/10/2021   Procedure: SUBQ ICD IMPLANT;  Surgeon: Deboraha Sprang, MD;  Location: [place] CV LAB;  Service: Cardiovascular;  Laterality: N/A;     Home Meds: Prior to Admission medications   Medication Sig Start Date End Date Taking? Authorizing Provider  aspirin EC 81 MG EC tablet Take 1 tablet (81 mg total) by mouth daily. 09/28/14  Yes Isaiah Serge, NP  carvedilol (COREG) 25 MG tablet Take 1 tablet (25 mg total) by mouth 2 (two) times daily with a meal. 02/28/15  Yes Biagio Borg, MD  colchicine 0.6 MG tablet Take 1 tablet (0.6 mg total) by mouth daily. 02/28/15  Yes Biagio Borg, MD  furosemide (LASIX) 40 MG tablet Take 1 tablet (40 mg total) by mouth daily. 02/28/15  Yes Biagio Borg, MD  sacubitril-valsartan (ENTRESTO) 49-51 MG Take 1 tablet by mouth 2 (two) times daily. 09/30/14  Yes Pixie Casino, MD  spironolactone (ALDACTONE) 25 MG tablet Take 0.5 tablets (12.5 mg total) by mouth daily. 02/28/15  Yes Biagio Borg, MD  traMADol (ULTRAM) 50 MG tablet Take 1 tablet (50 mg total) by mouth every 8 (eight) hours as needed. 06/22/14  Yes Leonard Schwartz, MD      Allergies: No Known Allergies  Social History   Socioeconomic History   Marital status: Single    Spouse name: Not on file   Number of children: Not on file   Years of education: Not on file   Highest education level: Not on file  Occupational History   Occupation: retired  Tobacco Use   Smoking status: Never   Smokeless tobacco: Never  Vaping Use   Vaping Use: Never used  Substance and Sexual Activity   Alcohol use: No    Alcohol/week: 0.0 standard drinks   Drug use: No   Sexual activity: Not Currently  Other Topics Concern   Not on file  Social History Narrative   Not on file   Social Determinants of Health   Financial Resource Strain: Low Risk    Difficulty of Paying Living  Expenses: Not hard at all  Food Insecurity: No Food Insecurity   Worried About Charity fundraiser in the Last Year: Never true   [place] in the Last Year: Never true  Transportation Needs: No Transportation Needs   Lack of Transportation (Medical): No   Lack of Transportation (Non-Medical): No  Physical Activity: Sufficiently Active   Days of Exercise per Week: 5 days   Minutes of Exercise per Session: 30 min  Stress: No Stress Concern Present   Feeling of Stress : Not at all  Social Connections: Moderately Integrated   Frequency of Communication with Friends and Family: More than three times a week   Frequency of Social Gatherings with Friends and Family: Once a week   Attends Religious Services: More than 4 times per year   Active Member of [employer] or Organizations: Yes   Attends Music therapist: More than 4 times per year   Marital Status: Never married  Human resources officer Violence: Not on file     Family History  Problem Relation Age of Onset   Lung cancer Mother    Diabetes Father    Heart disease Father    Heart attack Father 90   Hypertension Sister    Aneurysm Sister 58       brain aneurysm   Healthy Brother    Healthy Brother    Healthy Brother    Colon cancer Neg Hx    Esophageal cancer Neg Hx    Rectal cancer Neg Hx    Stomach cancer Neg Hx      ROS:  Please see the history of present illness.     All other systems reviewed and negative.    Physical Exam: There were no vitals taken for this visit. Well developed and nourished in no acute distress HENT normal Neck supple with JVP-flat Clear Device pocket well healed; without hematoma or erythema.  There is no tethering Regular rate and rhythm, no murmurs or gallops Abd-soft with active BS No Clubbing cyanosis edema Skin-warm and dry A & Oriented  Grossly normal sensory and motor function       Labs: Cardiac Enzymes No results for input(s): CKTOTAL, CKMB, TROPONINI in the last  72 hours. CBC Lab Results  Component Value Date   WBC 5.7 04/01/2021   HGB 15.2 04/01/2021   HCT 46.9 04/01/2021   MCV 87.8 04/01/2021   PLT 124.0 (L) 04/01/2021   PROTIME: No results for input(s): LABPROT, INR in the last 72 hours. Chemistry No results for input(s): NA, K, CL, CO2, BUN, CREATININE, CALCIUM, PROT, BILITOT, ALKPHOS,  ALT, AST, GLUCOSE in the last 168 hours.  Invalid input(s): LABALBU Lipids Lab Results  Component Value Date   CHOL 129 04/01/2021   HDL 35.20 (L) 04/01/2021   LDLCALC 86 04/05/2020   TRIG 228.0 (H) 04/01/2021   BNP No results found for: PROBNP Thyroid Function Tests: No results for input(s): TSH, T4TOTAL, T3FREE, THYROIDAB in the last 72 hours.  Invalid input(s): FREET3    Miscellaneous No results found for: DDIMER  Radiology/Studies:  CUP PACEART REMOTE DEVICE CHECK  Result Date: 05/13/2021 Scheduled remote reviewed. Normal device function.  Next remote 91 days- JJBSensing Configuration: Alternate Gain Setting: 1X Post Shock Pacing: OFF     Assessment and Plan:  Nonischemic cardiomyopathy  Congestive heart failure-colonic-class II systolic  ICD SubQ  The patient's device was interrogated.  The information was reviewed. No changes were made in the programming.     Hypertension    High Risk Medication Surveillance  Euvolemic continue current meds  Need to check surveillance labs on High Risk Medication    will see again in one year        [name]

## 2021-05-19 NOTE — Patient Instructions (Signed)
Medication Instructions:  Your physician has recommended you make the following change in your medication:   ** Begin Farxiga 10mg  - 1 tablet by mouth daily  *If you need a refill on your cardiac medications before your next appointment, please call your pharmacy*   Lab Work: None ordered.  If you have labs (blood work) drawn today and your tests are completely normal, you will receive your results only by: [place] (if you have MyChart) OR A paper copy in the mail If you have any lab test that is abnormal or we need to change your treatment, we will call you to review the results.   Testing/Procedures: None ordered.    Follow-Up: At [facility], you and your health needs are our priority.  As part of our continuing mission to provide you with exceptional heart care, we have created designated Provider Care Teams.  These Care Teams include your primary Cardiologist (physician) and Advanced Practice Providers (APPs -  Physician Assistants and Nurse Practitioners) who all work together to provide you with the care you need, when you need it.  We recommend signing up for the patient portal called "MyChart".  Sign up information is provided on this After Visit Summary.  MyChart is used to connect with patients for Virtual Visits (Telemedicine).  Patients are able to view lab/test results, encounter notes, upcoming appointments, etc.  Non-urgent messages can be sent to your provider as well.   To learn more about what you can do with MyChart, go to [email or web address].    Your next appointment:   9 months with Dr Olin Pia PA W1}

## 2021-05-22 ENCOUNTER — Encounter: Payer: Self-pay | Admitting: Internal Medicine

## 2021-05-22 NOTE — Progress Notes (Signed)
Remote ICD transmission.   

## 2021-05-23 NOTE — Telephone Encounter (Signed)
Spoke with pt and is willing to try the [place] and evaluate usage with Dr Debara Pickett at appt per Dr Olin Pia recommendation./cy

## 2021-05-29 ENCOUNTER — Other Ambulatory Visit: Payer: Self-pay | Admitting: Internal Medicine

## 2021-05-30 NOTE — Telephone Encounter (Signed)
Please refill as per office routine med refill policy (all routine meds to be refilled for 3 mo or monthly (per pt preference) up to one year from last visit, then month to month grace period for 3 mo, then further med refills will have to be denied) ? ?

## 2021-06-07 ENCOUNTER — Encounter: Payer: Self-pay | Admitting: Internal Medicine

## 2021-06-09 NOTE — Telephone Encounter (Signed)
Called patient - left voicemail to answer question - advised he could call back if he had any further questions.  Dr. Debara Pickett

## 2021-06-30 DIAGNOSIS — G4733 Obstructive sleep apnea (adult) (pediatric): Secondary | ICD-10-CM | POA: Diagnosis not present

## 2021-07-02 DIAGNOSIS — Z79899 Other long term (current) drug therapy: Secondary | ICD-10-CM | POA: Diagnosis not present

## 2021-07-04 DIAGNOSIS — Z79899 Other long term (current) drug therapy: Secondary | ICD-10-CM | POA: Diagnosis not present

## 2021-07-16 DIAGNOSIS — Z79899 Other long term (current) drug therapy: Secondary | ICD-10-CM | POA: Diagnosis not present

## 2021-07-18 DIAGNOSIS — Z79899 Other long term (current) drug therapy: Secondary | ICD-10-CM | POA: Diagnosis not present

## 2021-07-22 DIAGNOSIS — Z79899 Other long term (current) drug therapy: Secondary | ICD-10-CM | POA: Diagnosis not present

## 2021-07-29 DIAGNOSIS — Z79899 Other long term (current) drug therapy: Secondary | ICD-10-CM | POA: Diagnosis not present

## 2021-08-05 DIAGNOSIS — Z79899 Other long term (current) drug therapy: Secondary | ICD-10-CM | POA: Diagnosis not present

## 2021-08-12 ENCOUNTER — Ambulatory Visit (INDEPENDENT_AMBULATORY_CARE_PROVIDER_SITE_OTHER): Payer: Medicare Other

## 2021-08-12 DIAGNOSIS — I428 Other cardiomyopathies: Secondary | ICD-10-CM

## 2021-08-12 LAB — CUP PACEART REMOTE DEVICE CHECK
Battery Remaining Percentage: 96 %
Date Time Interrogation Session: 20230221070200
Implantable Lead Implant Date: 20160929
Implantable Lead Location: 753862
Implantable Lead Model: 3401
Implantable Pulse Generator Implant Date: 20220822
Pulse Gen Serial Number: 166274

## 2021-08-13 DIAGNOSIS — Z79899 Other long term (current) drug therapy: Secondary | ICD-10-CM | POA: Diagnosis not present

## 2021-08-19 NOTE — Progress Notes (Signed)
Remote ICD transmission.   

## 2021-08-21 DIAGNOSIS — Z79899 Other long term (current) drug therapy: Secondary | ICD-10-CM | POA: Diagnosis not present

## 2021-09-18 ENCOUNTER — Ambulatory Visit (INDEPENDENT_AMBULATORY_CARE_PROVIDER_SITE_OTHER): Payer: Medicare Other | Admitting: Internal Medicine

## 2021-09-18 ENCOUNTER — Encounter: Payer: Self-pay | Admitting: Internal Medicine

## 2021-09-18 VITALS — BP 118/74 | HR 74 | Ht 70.0 in | Wt 211.0 lb

## 2021-09-18 DIAGNOSIS — G4733 Obstructive sleep apnea (adult) (pediatric): Secondary | ICD-10-CM | POA: Diagnosis not present

## 2021-09-18 DIAGNOSIS — I428 Other cardiomyopathies: Secondary | ICD-10-CM

## 2021-09-18 DIAGNOSIS — I10 Essential (primary) hypertension: Secondary | ICD-10-CM | POA: Diagnosis not present

## 2021-09-18 DIAGNOSIS — Z95810 Presence of automatic (implantable) cardiac defibrillator: Secondary | ICD-10-CM | POA: Diagnosis not present

## 2021-09-18 NOTE — Patient Instructions (Signed)
Medication Instructions:  ?Your physician recommends that you continue on your current medications as directed. Please refer to the Current Medication list given to you today. ? ?*If you need a refill on your cardiac medications before your next appointment, please call your pharmacy* ? ?Testing/Procedures: ?Your physician has requested that you have an echocardiogram. Echocardiography is a painless test that uses sound waves to create images of your heart. It provides your doctor with information about the size and shape of your heart and how well your heart?s chambers and valves are working. This procedure takes approximately one hour. There are no restrictions for this procedure. ?-- done at [facility] ? ? ? ?Follow-Up: ?At [facility], you and your health needs are our priority.  As part of our continuing mission to provide you with exceptional heart care, we have created designated Provider Care Teams.  These Care Teams include your primary Cardiologist (physician) and Advanced Practice Providers (APPs -  Physician Assistants and Nurse Practitioners) who all work together to provide you with the care you need, when you need it. ? ?We recommend signing up for the patient portal called "MyChart".  Sign up information is provided on this After Visit Summary.  MyChart is used to connect with patients for Virtual Visits (Telemedicine).  Patients are able to view lab/test results, encounter notes, upcoming appointments, etc.  Non-urgent messages can be sent to your provider as well.   ?To learn more about what you can do with MyChart, go to [email or web address].   ? ?Your next appointment:   ?12 month(s) ? ?The format for your next appointment:   ?In Person ? ?Provider:   ?Pixie Casino, MD { ? ? ?

## 2021-09-18 NOTE — Progress Notes (Signed)
? ? ?OFFICE NOTE ? ?Chief Complaint:  ?No complaints ? ?Primary Care Physician: ?Biagio Borg, MD ? ?HPI:  ?Matthew Guerrero is a 66 y.o. male with a hx of HTN, HL, gout, asthma.  He was seen in the emergency room in late March with abdominal pain and vomiting. ECG was abnormal and a fast scan demonstrated no pericardial effusion but global hypokinesis. Plan was to follow-up as an outpatient. However, he presented back to the emergency room with progressive shortness of breath and chest pain with exertion.  Admitted 4/5-4/8 with acute systolic HF.  EF was noted to be 15-20% on echo.  Troponin was minimally elevated (0.14).   He was diuresed.  LHC was arranged and demonstrated normal coronary arteries.  He was dx with NICM.  Medications were adjusted for CHF and he returns for FU.  Of note, ACE inhibitor was stopped and he was placed on Entresto.   ? ?Since DC, he is doing well.  His breathing is improved. He is NYHA 2b.  He denies orthopnea, PND, edema.  He denies chest pain, syncope.  Does admit to snoring and does take daytime naps.  He has a non-productive cough.   ? ? ?Studies/Reports Reviewed Today: ? ?Echo 09/27/14 ?-  Mild LVH. EF 15% to 20%. Diffuse hypokinesis. There is akinesis of the entireanteroseptal myocardium. Grade 1 diastolic dysfunction. There is muscular trabeculation at apex of left ventricle (no obvious thrombus identified).  Definity contrast used. ?- Mitral valve: There was mild regurgitation. ?- Left atrium: The atrium was moderately dilated. ?- Right ventricle: The cavity size was moderately dilated. Wall thickness was normal. ?- Right atrium: The atrium was mildly dilated. ? ?LHC 09/27/14 ?The left main coronary artery is normal. ?The left anterior descending artery is normal. ?The left circumflex artery is normal. ?The right coronary artery is dominant and normal. ?EF: 15%. ? ?Matthew Guerrero has an upcoming sleep study which should be helpful hopefully and may be contributing to his  erythrocytosis. I suspect the etiology of his heart failure is hypertensive heart disease. There remains to be seen whether he'll have improvement in LV function. He seems to be tolerating his medications at this point. He does get somewhat fatigued with walking up stairs and doing activities. I suspect a benefit from cardiac rehabilitation. ? ?I saw Matthew Guerrero back in the office today. He is here for a limited echo follow-up which was performed on 02/08/2015. Unfortunately this shows the EF to to remain between 10 and 15%. You have his medication shows near optimal therapy as he is currently on aspirin, carvedilol, Lasix, and Entresto 49/51 mg. He seems to be tolerating the medications and has NYHA class II symptoms. ? ?Matthew Guerrero returns today for follow-up. He recently had placement of an AICD for primary prevention of sudden cardiac death by Dr. Caryl Comes in 03-16-23. This went well and since then he his not had any significant problems. He denies any device firings. He had one episode which was difficult to understand that happened in church but required some reprogramming. Weight is stable at 255 which was his weight in 2023/03/16. He continues to be compliant with CPAP and has an appointment to see Dr. Radford Pax for reevaluation in December. He is also seeing Dr. Caryl Comes back in January. He is currently applying for disability. It should be noted that he has class II symptoms and does get short of breath with moderate exertion and is unable to lift more than 10 pounds routinely. His previous job  required more physical work than this. ? ?10/25/2015 ? ?Matthew Guerrero returns today for follow-up. He's done exceedingly well. He seems to be tolerating Entresto at the 49/50 one dose. His weight has been stable, in fact it is improved to 230 pounds. He denies any worsening shortness of breath. He currently has class 1-2 symptoms. He's had no AICD firings. He uses CPAP and is been compliant with that. He has a follow-up with  Dr. Radford Pax this summer. He also has an ICD clinic follow-up as well. He is requesting a handicap parking sticker today which I will provide given the significance of his cardiomyopathy.  ? ?07/27/2016 ? ?Matthew Guerrero was seen in the office for follow-up today. He continues to do very well. He is completely asymptomatic. Weight is now down further from 230 pounds to 217 pounds. He is on the middle dose of interest oh by hesitant to increase the dose higher due to his blood pressure. He is also on a high dose of carvedilol, spironolactone, aspirin and Lasix. He has had no indication of worsening lead impedance on his AICD remote checks. He denies any shocks. He continues to have class 1-2 symptoms. His last echo in August 2017 showed an EF of 15% without any significant improvement. He remains compliant with CPAP. ? ?02/08/2017 ? ?Matthew Guerrero returns today for follow-up. He seems to be tolerating Entresto. Recently had an echo which showed improvement in LV function up from 15% to 25-30%. He does have an AICD in place. I've encouraged by the improvement in LV function which may be due to additional medical therapy. He's asymptomatic for most of the time with NYHA class 1-2 symptoms. Blood pressure is stable in the 120s over 70s. EKG shows a sinus rhythm with LVH and repolarization abnormality at 63-personally reviewed. Weight is been stable as well. He denies chest pain or worsening shortness of breath. He is compliant with his CPAP. He has an in office defibrillator check in September. ? ?08/24/2017 ? ?Matthew Guerrero was seen today for follow-up.  He continues to do very well.  He is on high-dose Entresto.  He has had improvement in LV function up to 25-30% as of August 2018.  His AICD is followed by Dr. Caryl Comes and is working appropriately.  His weight has been stable.  He continues to have mostly class I to class II NYHA symptoms.  Blood pressure is at goal.  EKG is unchanged.  He is compliant with CPAP. ? ?03/07/2018 ? ?[name] is seen today in follow-up.  Overall he is doing well on his medications endorses NYHA class I symptoms.  He did have some interval improvement in LVEF up to 25 to 30% last year.  He has an AICD which has not fired and is seen by Dr. Caryl Comes.  He has an appointment with Dr. Caryl Comes tomorrow.  Blood pressures a goal cholesterol is been at goal.  He reports compliance with CPAP.  Overall he is doing well.  Weight is 3 pounds lighter than his last visit. ? ?09/05/2018 ? ?Matthew Guerrero is seen today in follow-up.  He continues to do extremely well.  He has NYHA class I symptoms.  EF is somewhere around 25% based on his last echo.  He has a subcutaneous AICD which is not fired and is working appropriately.  He is also on CPAP for obstructive sleep apnea and sees Dr. Radford Pax again this summer.  He is compliant with this and finds that he sleeps well with good  energy levels.  Weight is down 5 pounds since his last visit.  His LDL is less than 70 which is at target. ? ?03/18/2020 ? ?Matthew Guerrero is seen today for follow-up.  He continues to do well.  I am pleased to report his LVEF is further increased up to 35 to 40 percent with continued NYHA class I-II symptoms.  He does have upcoming follow-up with EP for evaluation of his defibrillator.  Apparently he is close to ERI.  Weight has been stable.  He is on max dose Entresto, carvedilol, Aldactone and 20 mg Lasix daily.  EKG shows a sinus bradycardia with incomplete left bundle branch block.  LDL 84 in October 2020.  He continues to work with Dr. Radford Pax for obstructive sleep apnea. ? ?09/16/2020 ? ?Matthew Guerrero returns today for follow-up.  He continues to do well.  At most he has NYHA class II symptoms.  He has been having close evaluation of his defibrillator which is close to ERI.  He has an upcoming appointment with  EP.  He denies any weight gain or worsening edema.  Previous labs in October showed total cholesterol 139, HDL 39, LDL 86 and triglycerides 68.  A1c of  6.1. ? ?09/18/2021 ? ?Matthew Guerrero returns today for follow-up.  Overall he says he is doing very well.  He underwent generator change out of his subcutaneous ICD last fall with Dr. Caryl Comes.  He has been on [place] in addition

## 2021-09-29 ENCOUNTER — Other Ambulatory Visit: Payer: Self-pay | Admitting: Internal Medicine

## 2021-09-29 DIAGNOSIS — Z0001 Encounter for general adult medical examination with abnormal findings: Secondary | ICD-10-CM

## 2021-09-29 DIAGNOSIS — E538 Deficiency of other specified B group vitamins: Secondary | ICD-10-CM

## 2021-09-29 DIAGNOSIS — E7800 Pure hypercholesterolemia, unspecified: Secondary | ICD-10-CM

## 2021-09-29 DIAGNOSIS — R7303 Prediabetes: Secondary | ICD-10-CM

## 2021-09-29 DIAGNOSIS — E559 Vitamin D deficiency, unspecified: Secondary | ICD-10-CM

## 2021-09-30 DIAGNOSIS — G4733 Obstructive sleep apnea (adult) (pediatric): Secondary | ICD-10-CM | POA: Diagnosis not present

## 2021-10-03 ENCOUNTER — Ambulatory Visit (HOSPITAL_COMMUNITY): Payer: Medicare Other | Attending: Internal Medicine

## 2021-10-03 DIAGNOSIS — I428 Other cardiomyopathies: Secondary | ICD-10-CM | POA: Diagnosis not present

## 2021-10-03 LAB — ECHOCARDIOGRAM COMPLETE
Area-P 1/2: 3.03 cm2
S' Lateral: 4.3 cm

## 2021-10-08 ENCOUNTER — Ambulatory Visit (INDEPENDENT_AMBULATORY_CARE_PROVIDER_SITE_OTHER): Payer: Medicare Other

## 2021-10-08 VITALS — BP 110/70 | HR 81 | Temp 97.6°F | Resp 16 | Ht 70.0 in | Wt 216.8 lb

## 2021-10-08 DIAGNOSIS — Z0000 Encounter for general adult medical examination without abnormal findings: Secondary | ICD-10-CM | POA: Diagnosis not present

## 2021-10-08 NOTE — Progress Notes (Signed)
? ?Subjective:  ? Matthew Guerrero is a 66 y.o. male who presents for Medicare Annual/Subsequent preventive examination. ? ?Review of Systems    ? ?Cardiac Risk Factors include: advanced age (>78mn, >>38women);dyslipidemia;family history of premature cardiovascular disease;hypertension;male gender;obesity (BMI >30kg/m2) ? ?   ?Objective:  ?  ?Today's Vitals  ? 10/08/21 1441  ?BP: 110/70  ?Pulse: 81  ?Resp: 16  ?Temp: 97.6 ?F (36.4 ?C)  ?SpO2: 94%  ?Weight: 216 lb 12.8 oz (98.3 kg)  ?Height: '5\' 10"'$  (1.778 m)  ?PainSc: 0-No pain  ? ?Body mass index is 31.11 kg/m?. ? ? ?  10/08/2021  ?  3:12 PM 02/10/2021  ?  7:44 AM 10/07/2020  ?  4:00 PM 10/02/2019  ?  8:31 AM 08/18/2018  ?  2:21 PM 07/14/2018  ? 11:25 AM 06/17/2017  ?  8:04 AM  ?Advanced Directives  ?Does Patient Have a Medical Advance Directive? Yes Yes Yes Yes No No Yes  ?Type of [place] will  Healthcare Power of [place] ?Does patient want to make changes to medical advance directive? No - Patient declined  No - Patient declined No - Patient declined     ?Copy of [place] Chart? Yes - validated most recent copy scanned in chart (See row information)   Yes - validated most recent copy scanned in chart (See row information)   No - copy requested  ?Would patient like information on creating a medical advance directive?     Yes (ED - Information included in AVS) No - Patient declined   ? ? ?Current Medications (verified) ?Outpatient Encounter Medications as of 10/08/2021  ?Medication Sig  ? aspirin EC 81 MG tablet Take 81 mg by mouth daily. Swallow whole.  ? carvedilol (COREG) 25 MG tablet TAKE 1 TABLET BY MOUTH  TWICE DAILY WITH MEALS  ? colchicine 0.6 MG tablet TAKE 1 TABLET BY MOUTH  DAILY  ? dapagliflozin propanediol (FARXIGA) 10 MG TABS tablet Take 1 tablet (10 mg total) by mouth daily before breakfast.  ? furosemide (LASIX) 40 MG tablet  TAKE 1 TABLET BY MOUTH  DAILY  ? pantoprazole (PROTONIX) 40 MG tablet TAKE 1 TABLET BY MOUTH  DAILY  ? pravastatin (PRAVACHOL) 40 MG tablet TAKE 1 TABLET BY MOUTH IN  THE EVENING  ? sacubitril-valsartan (ENTRESTO) 97-103 MG Take 1 tablet by mouth 2 (two) times daily.  ? spironolactone (ALDACTONE) 25 MG tablet Take 0.5 tablets (12.5 mg total) by mouth daily.  ? vitamin B-12 (CYANOCOBALAMIN) 1000 MCG tablet Take 1 tablet (1,000 mcg total) by mouth daily.  ? ?No facility-administered encounter medications on file as of 10/08/2021.  ? ? ?Allergies (verified) ?Patient has no known allergies.  ? ?History: ?Past Medical History:  ?Diagnosis Date  ? ALLERGIC RHINITIS   ? Asthma   ? B12 deficiency 04/06/2020  ? Cervical radiculitis   ? CHF ([place]estive heart failure) ([place]   ? Chronic systolic heart failure ([facility] 02/28/2015  ? GERD (gastroesophageal reflux disease)   ? GOUT   ? Hyperglycemia 08/28/2014  ? HYPERLIPIDEMIA   ? HYPERTENSION   ? Lateral epicondylitis of right elbow   ? Nonischemic cardiomyopathy ([facility] 09/28/2014  ? Obesity (BMI 30-39.9) 06/06/2015  ? S/P cardiac cath wjith normal coronary arteries  09/28/2014  ? Sleep apnea   ? Thrombocytopenia ([place] 04/06/2020  ? ?Past Surgical History:  ?Procedure Laterality Date  ? COLONOSCOPY    ?  COLONOSCOPY WITH PROPOFOL N/A 06/17/2017  ? Procedure: COLONOSCOPY WITH PROPOFOL;  Surgeon: Milus Banister, MD;  Location: [facility];  Service: Endoscopy;  Laterality: N/A;  ? EP IMPLANTABLE DEVICE N/A 03/21/2015  ? Procedure: SubQ ICD Implant;  Surgeon: Deboraha Sprang, MD;  Location: [facility];  Service: Cardiovascular;  Laterality: N/A;  ? LEFT AND RIGHT HEART CATHETERIZATION WITH CORONARY ANGIOGRAM N/A 09/27/2014  ? Procedure: LEFT AND RIGHT HEART CATHETERIZATION WITH CORONARY ANGIOGRAM;  Surgeon: Belva Crome, MD;  Location: [facility] CATH LAB;  Service: Cardiovascular;  Laterality: N/A;  ? SUBQ ICD  03/21/2015  ? SUBQ ICD IMPLANT N/A 02/10/2021  ? Procedure: SUBQ ICD IMPLANT;   Surgeon: Deboraha Sprang, MD;  Location: [facility];  Service: Cardiovascular;  Laterality: N/A;  ? ?Family History  ?Problem Relation Age of Onset  ? Lung cancer Mother   ? Diabetes Father   ? Heart disease Father   ? Heart attack Father 58  ? Hypertension Sister   ? Aneurysm Sister 35  ?     brain aneurysm  ? Healthy Brother   ? Healthy Brother   ? Healthy Brother   ? Colon cancer Neg Hx   ? Esophageal cancer Neg Hx   ? Rectal cancer Neg Hx   ? Stomach cancer Neg Hx   ? ?Social History  ? ?Socioeconomic History  ? Marital status: Single  ?  Spouse name: Not on file  ? Number of children: Not on file  ? Years of education: Not on file  ? Highest education level: Not on file  ?Occupational History  ? Occupation: retired  ?Tobacco Use  ? Smoking status: Never  ? Smokeless tobacco: Never  ?Vaping Use  ? Vaping Use: Never used  ?Substance and Sexual Activity  ? Alcohol use: No  ?  Alcohol/week: 0.0 standard drinks  ? Drug use: No  ? Sexual activity: Not Currently  ?Other Topics Concern  ? Not on file  ?Social History Narrative  ? Not on file  ? ?Social Determinants of Health  ? ?Financial Resource Strain: Low Risk   ? Difficulty of Paying Living Expenses: Not hard at all  ?Food Insecurity: No Food Insecurity  ? Worried About Charity fundraiser in the Last Year: Never true  ? Ran Out of Food in the Last Year: Never true  ?Transportation Needs: No Transportation Needs  ? Lack of Transportation (Medical): No  ? Lack of Transportation (Non-Medical): No  ?Physical Activity: Sufficiently Active  ? Days of Exercise per Week: 5 days  ? Minutes of Exercise per Session: 30 min  ?Stress: No Stress Concern Present  ? Feeling of Stress : Not at all  ?Social Connections: Moderately Integrated  ? Frequency of Communication with Friends and Family: More than three times a week  ? Frequency of Social Gatherings with Friends and Family: Once a week  ? Attends Religious Services: More than 4 times per year  ? Active Member of  Clubs or Organizations: Yes  ? Attends Archivist Meetings: More than 4 times per year  ? Marital Status: Never married  ? ? ?Tobacco Counseling ?Counseling given: Not Answered ? ? ?Clinical Intake: ? ?Pre-visit preparation completed: Yes ? ?Pain : No/denies pain ?Pain Score: 0-No pain ? ?  ? ?BMI - recorded: 31.11 ?Nutritional Status: BMI > 30  Obese ?Nutritional Risks: None ?Diabetes: No ? ?How often do you need to have someone help you when you read instructions,  pamphlets, or other written materials from your doctor or pharmacy?: 1 - Never ?What is the last grade level you completed in school?: GED ? ?Diabetic? prediabetes ? ?Interpreter Needed?: No ? ?Information entered by :: Lisette Abu, LPN ? ? ?Activities of Daily Living ? ?  10/08/2021  ?  2:44 PM 10/07/2021  ?  2:17 PM  ?In your present state of health, do you have any difficulty performing the following activities:  ?Hearing? 0 0  ?Vision? 0 0  ?Difficulty concentrating or making decisions? 0 0  ?Walking or climbing stairs? 0 0  ?Dressing or bathing? 0 0  ?Doing errands, shopping? 0 0  ?Preparing Food and eating ? N N  ?Using the Toilet? N N  ?In the past six months, have you accidently leaked urine? N N  ?Do you have problems with loss of bowel control? N N  ?Managing your Medications? N N  ?Managing your Finances? N N  ?Housekeeping or managing your Housekeeping? N N  ? ? ?Patient Care Team: ?Biagio Borg, MD as PCP - General (Internal Medicine) ?Pixie Casino, MD as PCP - Cardiology (Cardiology) ?Sueanne Margarita, MD as PCP - Sleep Medicine (Cardiology) ?Deboraha Sprang, MD as PCP - Electrophysiology (Cardiology) ?Marin Comment, [place], [place] as Referring Physician (Optometry) ? ?Indicate any recent Medical Services you may have received from other than [facility] providers in the past year (date may be approximate). ? ?   ?Assessment:  ? This is a routine wellness examination for [place]. ? ?Hearing/Vision screen ?Hearing Screening - Comments:: Patient  denied any hearing difficulty.   ?No hearing aids. ? ?Vision Screening - Comments:: Patient does wear corrective lenses/contacts.  ?Eye exam done by:  My Dorien Chihuahua, OD. at [facility]  ? ? ?Dietary i

## 2021-10-08 NOTE — Patient Instructions (Signed)
Matthew Guerrero , ?Thank you for taking time to come for your Medicare Wellness Visit. I appreciate your ongoing commitment to your health goals. Please review the following plan we discussed and let me know if I can assist you in the future.  ? ?Screening recommendations/referrals: ?Colonoscopy: 06/17/2017; due every 5 years ?Recommended yearly ophthalmology/optometry visit for glaucoma screening and checkup ?Recommended yearly dental visit for hygiene and checkup ? ?Vaccinations: ?Influenza vaccine: 03/28/2021 ?Pneumococcal vaccine: 02/28/2015, 04/04/2020 ?Tdap vaccine: 07/01/2009; due every 10 years (overdue) ?Shingles vaccine: 09/29/2016, 12/02/2016  ?Covid-19: 09/08/2019, 10/09/2019, 04/23/2020 ? ?Advanced directives: Yes; documents on file. ? ?Conditions/risks identified: Yes ? ?Next appointment: Please schedule your next Medicare Wellness Visit with your Nurse Health Advisor in 1 year by calling [phone]. ? ?Preventive Care 35 Years and Older, Male ?Preventive care refers to lifestyle choices and visits with your health care provider that can promote health and wellness. ?What does preventive care include? ?A yearly physical exam. This is also called an annual well check. ?Dental exams once or twice a year. ?Routine eye exams. Ask your health care provider how often you should have your eyes checked. ?Personal lifestyle choices, including: ?Daily care of your teeth and gums. ?Regular physical activity. ?Eating a healthy diet. ?Avoiding tobacco and drug use. ?Limiting alcohol use. ?Practicing safe sex. ?Taking low doses of aspirin every day. ?Taking vitamin and mineral supplements as recommended by your health care provider. ?What happens during an annual well check? ?The services and screenings done by your health care provider during your annual well check will depend on your age, overall health, lifestyle risk factors, and family history of disease. ?Counseling  ?Your health care provider may ask you questions about  your: ?Alcohol use. ?Tobacco use. ?Drug use. ?Emotional well-being. ?Home and relationship well-being. ?Sexual activity. ?Eating habits. ?History of falls. ?Memory and ability to understand (cognition). ?Work and work Statistician. ?Screening  ?You may have the following tests or measurements: ?Height, weight, and BMI. ?Blood pressure. ?Lipid and cholesterol levels. These may be checked every 5 years, or more frequently if you are over 64 years old. ?Skin check. ?Lung cancer screening. You may have this screening every year starting at age 39 if you have a 30-pack-year history of smoking and currently smoke or have quit within the past 15 years. ?Fecal occult blood test (FOBT) of the stool. You may have this test every year starting at age 74. ?Flexible sigmoidoscopy or colonoscopy. You may have a sigmoidoscopy every 5 years or a colonoscopy every 10 years starting at age 8. ?Prostate cancer screening. Recommendations will vary depending on your family history and other risks. ?Hepatitis C blood test. ?Hepatitis B blood test. ?Sexually transmitted disease (STD) testing. ?Diabetes screening. This is done by checking your blood sugar (glucose) after you have not eaten for a while (fasting). You may have this done every 1-3 years. ?Abdominal aortic aneurysm (AAA) screening. You may need this if you are a current or former smoker. ?Osteoporosis. You may be screened starting at age 84 if you are at high risk. ?Talk with your health care provider about your test results, treatment options, and if necessary, the need for more tests. ?Vaccines  ?Your health care provider may recommend certain vaccines, such as: ?Influenza vaccine. This is recommended every year. ?Tetanus, diphtheria, and acellular pertussis (Tdap, Td) vaccine. You may need a Td booster every 10 years. ?Zoster vaccine. You may need this after age 10. ?Pneumococcal 13-valent conjugate (PCV13) vaccine. One dose is recommended after age 70. ?Pneumococcal  polysaccharide (PPSV23) vaccine. One dose is recommended after age 94. ?Talk to your health care provider about which screenings and vaccines you need and how often you need them. ?This information is not intended to replace advice given to you by your health care provider. Make sure you discuss any questions you have with your health care provider. ?Document Released: 07/05/2015 Document Revised: 02/26/2016 Document Reviewed: 04/09/2015 ?Elsevier Interactive Patient Education ? 2017 [place] [employer]. ? ?Fall Prevention in the Home ?Falls can cause injuries. They can happen to people of all ages. There are many things you can do to make your home safe and to help prevent falls. ?What can I do on the outside of my home? ?Regularly fix the edges of walkways and driveways and fix any cracks. ?Remove anything that might make you trip as you walk through a door, such as a raised step or threshold. ?Trim any bushes or trees on the path to your home. ?Use bright outdoor lighting. ?Clear any walking paths of anything that might make someone trip, such as rocks or tools. ?Regularly check to see if handrails are loose or broken. Make sure that both sides of any steps have handrails. ?Any raised decks and porches should have guardrails on the edges. ?Have any leaves, snow, or ice cleared regularly. ?Use sand or salt on walking paths during winter. ?Clean up any spills in your garage right away. This includes oil or grease spills. ?What can I do in the bathroom? ?Use night lights. ?Install grab bars by the toilet and in the tub and shower. Do not use towel bars as grab bars. ?Use non-skid mats or decals in the tub or shower. ?If you need to sit down in the shower, use a plastic, non-slip stool. ?Keep the floor dry. Clean up any water that spills on the floor as soon as it happens. ?Remove soap buildup in the tub or shower regularly. ?Attach bath mats securely with double-sided non-slip rug tape. ?Do not have throw rugs and other  things on the floor that can make you trip. ?What can I do in the bedroom? ?Use night lights. ?Make sure that you have a light by your bed that is easy to reach. ?Do not use any sheets or blankets that are too big for your bed. They should not hang down onto the floor. ?Have a firm chair that has side arms. You can use this for support while you get dressed. ?Do not have throw rugs and other things on the floor that can make you trip. ?What can I do in the kitchen? ?Clean up any spills right away. ?Avoid walking on wet floors. ?Keep items that you use a lot in easy-to-reach places. ?If you need to reach something above you, use a strong step stool that has a grab bar. ?Keep electrical cords out of the way. ?Do not use floor polish or wax that makes floors slippery. If you must use wax, use non-skid floor wax. ?Do not have throw rugs and other things on the floor that can make you trip. ?What can I do with my stairs? ?Do not leave any items on the stairs. ?Make sure that there are handrails on both sides of the stairs and use them. Fix handrails that are broken or loose. Make sure that handrails are as long as the stairways. ?Check any carpeting to make sure that it is firmly attached to the stairs. Fix any carpet that is loose or worn. ?Avoid having throw rugs at the top or  bottom of the stairs. If you do have throw rugs, attach them to the floor with carpet tape. ?Make sure that you have a light switch at the top of the stairs and the bottom of the stairs. If you do not have them, ask someone to add them for you. ?What else can I do to help prevent falls? ?Wear shoes that: ?Do not have high heels. ?Have rubber bottoms. ?Are comfortable and fit you well. ?Are closed at the toe. Do not wear sandals. ?If you use a stepladder: ?Make sure that it is fully opened. Do not climb a closed stepladder. ?Make sure that both sides of the stepladder are locked into place. ?Ask someone to hold it for you, if possible. ?Clearly  mark and make sure that you can see: ?Any grab bars or handrails. ?First and last steps. ?Where the edge of each step is. ?Use tools that help you move around (mobility aids) if they are needed. These include:

## 2021-10-10 ENCOUNTER — Other Ambulatory Visit: Payer: Self-pay | Admitting: Internal Medicine

## 2021-10-10 DIAGNOSIS — E785 Hyperlipidemia, unspecified: Secondary | ICD-10-CM

## 2021-10-11 NOTE — Telephone Encounter (Signed)
Please refill as per office routine med refill policy (all routine meds to be refilled for 3 mo or monthly (per pt preference) up to one year from last visit, then month to month grace period for 3 mo, then further med refills will have to be denied) ? ?

## 2021-10-30 ENCOUNTER — Encounter: Payer: Self-pay | Admitting: Internal Medicine

## 2021-11-02 ENCOUNTER — Encounter: Payer: Self-pay | Admitting: Internal Medicine

## 2021-11-03 ENCOUNTER — Ambulatory Visit (INDEPENDENT_AMBULATORY_CARE_PROVIDER_SITE_OTHER): Payer: Medicare Other | Admitting: Internal Medicine

## 2021-11-03 ENCOUNTER — Encounter: Payer: Self-pay | Admitting: Internal Medicine

## 2021-11-03 ENCOUNTER — Telehealth: Payer: Self-pay | Admitting: Internal Medicine

## 2021-11-03 VITALS — BP 118/68 | HR 80 | Temp 98.6°F | Ht 70.0 in | Wt 209.0 lb

## 2021-11-03 DIAGNOSIS — R7303 Prediabetes: Secondary | ICD-10-CM | POA: Diagnosis not present

## 2021-11-03 DIAGNOSIS — I10 Essential (primary) hypertension: Secondary | ICD-10-CM | POA: Diagnosis not present

## 2021-11-03 DIAGNOSIS — M109 Gout, unspecified: Secondary | ICD-10-CM | POA: Insufficient documentation

## 2021-11-03 LAB — URIC ACID: Uric Acid, Serum: 7.4 mg/dL (ref 4.0–7.8)

## 2021-11-03 MED ORDER — ALLOPURINOL 300 MG PO TABS: 300.0000 mg | ORAL_TABLET | Freq: Every day | ORAL | 3 refills | Status: DC

## 2021-11-03 MED ORDER — PREDNISONE 10 MG PO TABS: ORAL_TABLET | ORAL | 0 refills | Status: DC

## 2021-11-03 MED ORDER — INDOMETHACIN 50 MG PO CAPS: 50.0000 mg | ORAL_CAPSULE | Freq: Three times a day (TID) | ORAL | 2 refills | Status: DC

## 2021-11-03 MED ORDER — METHYLPREDNISOLONE ACETATE 80 MG/ML IJ SUSP
80.0000 mg | Freq: Once | INTRAMUSCULAR | Status: AC
  Administered 2021-11-03: 80 mg via INTRAMUSCULAR

## 2021-11-03 NOTE — Assessment & Plan Note (Signed)
BP Readings from Last 3 Encounters:  ?11/03/21 118/68  ?10/08/21 110/70  ?09/18/21 118/74  ? ?Stable, pt to continue medical treatment  - coreg, entresto ?

## 2021-11-03 NOTE — Patient Instructions (Addendum)
You had the steroid shot today ? ?Please take all new medication as prescribed - the prednisone ? ?Ok to STOP the colchicine ? ?Please take all new medication as prescribed - the allopurinol every day for prevention, and also indocin as needed for any future flare ups ? ?Please continue all other medications as before, and refills have been done if requested. ? ?Please have the pharmacy call with any other refills you may need. ? ?Please keep your appointments with your specialists as you may have planned ? ? ? ?

## 2021-11-03 NOTE — Progress Notes (Signed)
Patient ID: Matthew Guerrero, male   DOB: [birth year], 66 y.o.   MRN: [id number] ? ? ? ?    Chief Complaint: follow up 6 days left ankle pain and swelling, htn, hyperglycemia ? ?     HPI:  Matthew Guerrero is a 66 y.o. male here with c/o 6 days onset mild then later very severe left ankle pain, swelling, redness with stiffness, reduced ROM, much worse to walk, better to sit, constant, sharp, not controled with colchicine, has not tried other nsaid.  Has hx of normal renal function.  Pt denies chest pain, increased sob or doe, wheezing, orthopnea, PND, increased LE swelling, palpitations, dizziness or syncope.   Pt denies polydipsia, polyuria, or new focal neuro s/s.    Pt denies fever, wt loss, night sweats, loss of appetite, or other constitutional symptoms  No falls or injury or trauma ?      ?Wt Readings from Last 3 Encounters:  ?11/03/21 209 lb (94.8 kg)  ?10/08/21 216 lb 12.8 oz (98.3 kg)  ?09/18/21 211 lb (95.7 kg)  ? ?BP Readings from Last 3 Encounters:  ?11/03/21 118/68  ?10/08/21 110/70  ?09/18/21 118/74  ? ?      ?Past Medical History:  ?Diagnosis Date  ? ALLERGIC RHINITIS   ? Asthma   ? B12 deficiency 04/06/2020  ? Cervical radiculitis   ? CHF (congestive heart failure) ([place])   ? Chronic systolic heart failure ([place]) 02/28/2015  ? GERD (gastroesophageal reflux disease)   ? GOUT   ? Hyperglycemia 08/28/2014  ? HYPERLIPIDEMIA   ? HYPERTENSION   ? Lateral epicondylitis of right elbow   ? Nonischemic cardiomyopathy ([place]) 09/28/2014  ? Obesity (BMI 30-39.9) 06/06/2015  ? S/P cardiac cath wjith normal coronary arteries  09/28/2014  ? Sleep apnea   ? Thrombocytopenia ([place]) 04/06/2020  ? ?Past Surgical History:  ?Procedure Laterality Date  ? COLONOSCOPY    ? COLONOSCOPY WITH PROPOFOL N/A 06/17/2017  ? Procedure: COLONOSCOPY WITH PROPOFOL;  Surgeon: Milus Banister, MD;  Location: [facility];  Service: Endoscopy;  Laterality: N/A;  ? EP IMPLANTABLE DEVICE N/A 03/21/2015  ? Procedure: SubQ ICD Implant;  Surgeon: Deboraha Sprang, MD;  Location: [facility];  Service: Cardiovascular;  Laterality: N/A;  ? LEFT AND RIGHT HEART CATHETERIZATION WITH CORONARY ANGIOGRAM N/A 09/27/2014  ? Procedure: LEFT AND RIGHT HEART CATHETERIZATION WITH CORONARY ANGIOGRAM;  Surgeon: Belva Crome, MD;  Location: [facility] CATH LAB;  Service: Cardiovascular;  Laterality: N/A;  ? SUBQ ICD  03/21/2015  ? SUBQ ICD IMPLANT N/A 02/10/2021  ? Procedure: SUBQ ICD IMPLANT;  Surgeon: Deboraha Sprang, MD;  Location: [facility];  Service: Cardiovascular;  Laterality: N/A;  ? ? reports that he has never smoked. He has never used smokeless tobacco. He reports that he does not drink alcohol and does not use drugs. ?family history includes Aneurysm (age of onset: 80) in his sister; Diabetes in his father; Healthy in his brother, brother, and brother; Heart attack (age of onset: 56) in his father; Heart disease in his father; Hypertension in his sister; Lung cancer in his mother. ?No Known Allergies ?Current Outpatient Medications on File Prior to Visit  ?Medication Sig Dispense Refill  ? aspirin EC 81 MG tablet Take 81 mg by mouth daily. Swallow whole.    ? carvedilol (COREG) 25 MG tablet TAKE 1 TABLET BY MOUTH  TWICE DAILY WITH MEALS 180 tablet 3  ? dapagliflozin propanediol (FARXIGA) 10 MG TABS tablet Take 1  tablet (10 mg total) by mouth daily before breakfast. 90 tablet 3  ? furosemide (LASIX) 40 MG tablet TAKE 1 TABLET BY MOUTH  DAILY 90 tablet 3  ? pantoprazole (PROTONIX) 40 MG tablet TAKE 1 TABLET BY MOUTH  DAILY 90 tablet 2  ? pravastatin (PRAVACHOL) 40 MG tablet TAKE 1 TABLET BY MOUTH IN  THE EVENING 90 tablet 1  ? sacubitril-valsartan (ENTRESTO) 97-103 MG Take 1 tablet by mouth 2 (two) times daily. 180 tablet 3  ? spironolactone (ALDACTONE) 25 MG tablet Take 0.5 tablets (12.5 mg total) by mouth daily. 45 tablet 3  ? vitamin B-12 (CYANOCOBALAMIN) 1000 MCG tablet Take 1 tablet (1,000 mcg total) by mouth daily. 90 tablet 3  ? ?No current facility-administered  medications on file prior to visit.  ? ?     ROS:  All others reviewed and negative. ? ?Objective  ? ?     PE:  BP 118/68 (BP Location: Right Arm, Patient Position: Sitting, Cuff Size: Large)   Pulse 80   Temp 98.6 ?F (37 ?C) (Oral)   Ht '5\' 10"'$  (1.778 m)   Wt 209 lb (94.8 kg)   SpO2 97%   BMI 29.99 kg/m?  ? ?              Constitutional: Pt appears in NAD ?              HENT: Head: NCAT.  ?              Right Ear: External ear normal.   ?              Left Ear: External ear normal.  ?              Eyes: . Pupils are equal, round, and reactive to light. Conjunctivae and EOM are normal ?              Nose: without d/c or deformity ?              Neck: Neck supple. Gross normal ROM ?              Cardiovascular: Normal rate and regular rhythm.   ?              Pulmonary/Chest: Effort normal and breath sounds without rales or wheezing.  ?              Abd:  Soft, NT, ND, + BS, no organomegaly ?              Neurological: Pt is alert. At baseline orientation, motor grossly intact ?              Skin: left ankle with 3+ red, tender, warm and tender with reduced ROM ?              Psychiatric: Pt behavior is normal without agitation  ? ?Micro: none ? ?Cardiac tracings I have personally interpreted today:  none ? ?Pertinent Radiological findings (summarize): none  ? ?Lab Results  ?Component Value Date  ? WBC 5.7 04/01/2021  ? HGB 15.2 04/01/2021  ? HCT 46.9 04/01/2021  ? PLT 124.0 (L) 04/01/2021  ? GLUCOSE 125 (H) 04/01/2021  ? CHOL 129 04/01/2021  ? TRIG 228.0 (H) 04/01/2021  ? HDL 35.20 (L) 04/01/2021  ? LDLDIRECT 73.0 04/01/2021  ? [place] 86 04/05/2020  ? ALT 16 04/01/2021  ? AST 16 04/01/2021  ? NA 137 04/01/2021  ? K 4.3 04/01/2021  ?  CL 101 04/01/2021  ? CREATININE 1.15 04/01/2021  ? BUN 13 04/01/2021  ? CO2 27 04/01/2021  ? TSH 2.61 04/01/2021  ? PSA 0.87 04/01/2021  ? INR 1.2 (H) 03/15/2015  ? HGBA1C 6.4 04/01/2021  ? ?Assessment/Plan:  ?Matthew Guerrero is a 66 y.o. Black or African American [2] male with   has a past medical history of ALLERGIC RHINITIS, Asthma, B12 deficiency (04/06/2020), Cervical radiculitis, CHF (congestive heart failure) ([place]), Chronic systolic heart failure ([place]) (02/28/2015), GERD (gastroesophageal reflux disease), GOUT, Hyperglycemia (08/28/2014), HYPERLIPIDEMIA, HYPERTENSION, Lateral epicondylitis of right elbow, Nonischemic cardiomyopathy ([place]) (09/28/2014), Obesity (BMI 30-39.9) (06/06/2015), S/P cardiac cath wjith normal coronary arteries  (09/28/2014), Sleep apnea, and Thrombocytopenia ([place]) (04/06/2020). ? ?Acute gouty arthritis ?6 day onset severe acute with hx of similar, not controlled with daily colchicine preventive now for depomedrol im 80, prednisone taper, change colchicine to allopurinol 300 qd preventive, and add indocin 50 tid prn acute recurrence, also check uric acid level ? ?Essential hypertension ?BP Readings from Last 3 Encounters:  ?11/03/21 118/68  ?10/08/21 110/70  ?09/18/21 118/74  ? ?Stable, pt to continue medical treatment  - coreg, entresto ? ?Prediabetes ?Lab Results  ?Component Value Date  ? HGBA1C 6.4 04/01/2021  ? ?Stable, pt to continue current medical treatment farxiga ? ?Followup: Return if symptoms worsen or fail to improve. ? ?Cathlean Cower, MD 11/03/2021 9:32 PM ?[place] ?[place] ?Internal Medicine ?

## 2021-11-03 NOTE — Assessment & Plan Note (Signed)
Lab Results  ?Component Value Date  ? HGBA1C 6.4 04/01/2021  ? ?Stable, pt to continue current medical treatment farxiga ? ?

## 2021-11-03 NOTE — Telephone Encounter (Signed)
?   Any chance he can come about 430 today for visit?  thanks ?

## 2021-11-03 NOTE — Assessment & Plan Note (Signed)
6 day onset severe acute with hx of similar, not controlled with daily colchicine preventive now for depomedrol im 80, prednisone taper, change colchicine to allopurinol 300 qd preventive, and add indocin 50 tid prn acute recurrence, also check uric acid level ?

## 2021-11-06 ENCOUNTER — Encounter: Payer: Self-pay | Admitting: Internal Medicine

## 2021-11-11 ENCOUNTER — Ambulatory Visit (INDEPENDENT_AMBULATORY_CARE_PROVIDER_SITE_OTHER): Payer: Medicare Other

## 2021-11-11 DIAGNOSIS — I428 Other cardiomyopathies: Secondary | ICD-10-CM

## 2021-11-11 LAB — CUP PACEART REMOTE DEVICE CHECK
Battery Remaining Percentage: 93 %
Date Time Interrogation Session: 20230523071100
Implantable Lead Implant Date: 20160929
Implantable Lead Location: 753862
Implantable Lead Model: 3401
Implantable Pulse Generator Implant Date: 20220822
Pulse Gen Serial Number: 166274

## 2021-11-11 MED ORDER — INDOMETHACIN 50 MG PO CAPS: 50.0000 mg | ORAL_CAPSULE | Freq: Three times a day (TID) | ORAL | 0 refills | Status: DC

## 2021-11-11 MED ORDER — ALLOPURINOL 300 MG PO TABS: 300.0000 mg | ORAL_TABLET | Freq: Every day | ORAL | 3 refills | Status: DC

## 2021-11-18 ENCOUNTER — Encounter: Payer: Self-pay | Admitting: Internal Medicine

## 2021-11-18 DIAGNOSIS — E785 Hyperlipidemia, unspecified: Secondary | ICD-10-CM

## 2021-11-19 ENCOUNTER — Encounter: Payer: Self-pay | Admitting: Internal Medicine

## 2021-11-19 MED ORDER — VITAMIN B-12 1000 MCG PO TABS: 1000.0000 ug | ORAL_TABLET | Freq: Every day | ORAL | 1 refills | Status: AC

## 2021-11-19 MED ORDER — CARVEDILOL 25 MG PO TABS: 25.0000 mg | ORAL_TABLET | Freq: Two times a day (BID) | ORAL | 1 refills | Status: DC

## 2021-11-19 MED ORDER — PRAVASTATIN SODIUM 40 MG PO TABS: 40.0000 mg | ORAL_TABLET | Freq: Every evening | ORAL | 1 refills | Status: DC

## 2021-11-19 MED ORDER — INDOMETHACIN 50 MG PO CAPS: 50.0000 mg | ORAL_CAPSULE | Freq: Three times a day (TID) | ORAL | 0 refills | Status: DC

## 2021-11-19 MED ORDER — PANTOPRAZOLE SODIUM 40 MG PO TBEC: 40.0000 mg | DELAYED_RELEASE_TABLET | Freq: Every day | ORAL | 1 refills | Status: DC

## 2021-11-25 NOTE — Progress Notes (Signed)
Remote ICD transmission.   

## 2021-12-18 ENCOUNTER — Other Ambulatory Visit: Payer: Self-pay | Admitting: Internal Medicine

## 2021-12-18 DIAGNOSIS — I428 Other cardiomyopathies: Secondary | ICD-10-CM

## 2021-12-19 MED ORDER — DAPAGLIFLOZIN PROPANEDIOL 10 MG PO TABS: 10.0000 mg | ORAL_TABLET | Freq: Every day | ORAL | 1 refills | Status: DC

## 2021-12-24 DIAGNOSIS — G4733 Obstructive sleep apnea (adult) (pediatric): Secondary | ICD-10-CM | POA: Diagnosis not present

## 2022-01-02 ENCOUNTER — Other Ambulatory Visit: Payer: Self-pay | Admitting: Internal Medicine

## 2022-01-07 ENCOUNTER — Other Ambulatory Visit: Payer: Self-pay | Admitting: Internal Medicine

## 2022-02-08 ENCOUNTER — Other Ambulatory Visit: Payer: Self-pay | Admitting: Internal Medicine

## 2022-02-10 ENCOUNTER — Ambulatory Visit (INDEPENDENT_AMBULATORY_CARE_PROVIDER_SITE_OTHER): Payer: Medicare Other

## 2022-02-10 DIAGNOSIS — I5022 Chronic systolic (congestive) heart failure: Secondary | ICD-10-CM

## 2022-02-10 DIAGNOSIS — I428 Other cardiomyopathies: Secondary | ICD-10-CM

## 2022-02-10 LAB — CUP PACEART REMOTE DEVICE CHECK
Battery Remaining Percentage: 90 %
Date Time Interrogation Session: 20230822070600
Implantable Lead Implant Date: 20160929
Implantable Lead Location: 753862
Implantable Lead Model: 3401
Implantable Pulse Generator Implant Date: 20220822
Pulse Gen Serial Number: 166274

## 2022-03-05 ENCOUNTER — Other Ambulatory Visit: Payer: Self-pay | Admitting: Internal Medicine

## 2022-03-10 NOTE — Progress Notes (Signed)
Remote ICD transmission.   

## 2022-03-20 ENCOUNTER — Other Ambulatory Visit: Payer: Self-pay | Admitting: Internal Medicine

## 2022-03-20 ENCOUNTER — Other Ambulatory Visit (INDEPENDENT_AMBULATORY_CARE_PROVIDER_SITE_OTHER): Payer: Medicare Other

## 2022-03-20 DIAGNOSIS — E559 Vitamin D deficiency, unspecified: Secondary | ICD-10-CM | POA: Diagnosis not present

## 2022-03-20 DIAGNOSIS — E7800 Pure hypercholesterolemia, unspecified: Secondary | ICD-10-CM | POA: Diagnosis not present

## 2022-03-20 DIAGNOSIS — N179 Acute kidney failure, unspecified: Secondary | ICD-10-CM

## 2022-03-20 DIAGNOSIS — E538 Deficiency of other specified B group vitamins: Secondary | ICD-10-CM | POA: Diagnosis not present

## 2022-03-20 DIAGNOSIS — R7303 Prediabetes: Secondary | ICD-10-CM | POA: Diagnosis not present

## 2022-03-20 DIAGNOSIS — Z0001 Encounter for general adult medical examination with abnormal findings: Secondary | ICD-10-CM | POA: Diagnosis not present

## 2022-03-20 LAB — CBC WITH DIFFERENTIAL/PLATELET
Basophils Absolute: 0 10*3/uL (ref 0.0–0.1)
Basophils Relative: 0.5 % (ref 0.0–3.0)
Eosinophils Absolute: 0.2 10*3/uL (ref 0.0–0.7)
Eosinophils Relative: 3.1 % (ref 0.0–5.0)
HCT: 50.2 % (ref 39.0–52.0)
Hemoglobin: 16.1 g/dL (ref 13.0–17.0)
Lymphocytes Relative: 41.5 % (ref 12.0–46.0)
Lymphs Abs: 3.4 10*3/uL (ref 0.7–4.0)
MCHC: 32.1 g/dL (ref 30.0–36.0)
MCV: 89.9 fl (ref 78.0–100.0)
Monocytes Absolute: 0.6 10*3/uL (ref 0.1–1.0)
Monocytes Relative: 7.3 % (ref 3.0–12.0)
Neutro Abs: 3.9 10*3/uL (ref 1.4–7.7)
Neutrophils Relative %: 47.6 % (ref 43.0–77.0)
Platelets: 148 10*3/uL — ABNORMAL LOW (ref 150.0–400.0)
RBC: 5.59 Mil/uL (ref 4.22–5.81)
RDW: 15.2 % (ref 11.5–15.5)
WBC: 8.1 10*3/uL (ref 4.0–10.5)

## 2022-03-20 LAB — BASIC METABOLIC PANEL
BUN: 22 mg/dL (ref 6–23)
CO2: 24 mEq/L (ref 19–32)
Calcium: 9.7 mg/dL (ref 8.4–10.5)
Chloride: 101 mEq/L (ref 96–112)
Creatinine, Ser: 1.9 mg/dL — ABNORMAL HIGH (ref 0.40–1.50)
GFR: 36.04 mL/min — ABNORMAL LOW (ref >60.00)
Glucose, Bld: 150 mg/dL — ABNORMAL HIGH (ref 70–99)
Potassium: 4.3 mEq/L (ref 3.5–5.1)
Sodium: 137 mEq/L (ref 135–145)

## 2022-03-20 LAB — LIPID PANEL
Cholesterol: 139 mg/dL (ref 0–200)
HDL: 31.6 mg/dL — ABNORMAL LOW (ref >39.00)
LDL Cholesterol: 69 mg/dL (ref 0–99)
NonHDL: 107.68
Total CHOL/HDL Ratio: 4
Triglycerides: 195 mg/dL — ABNORMAL HIGH (ref 0.0–149.0)
VLDL: 39 mg/dL (ref 0.0–40.0)

## 2022-03-20 LAB — URINALYSIS, ROUTINE W REFLEX MICROSCOPIC
Bilirubin Urine: NEGATIVE
Hgb urine dipstick: NEGATIVE
Ketones, ur: NEGATIVE
Leukocytes,Ua: NEGATIVE
Nitrite: NEGATIVE
Specific Gravity, Urine: 1.025 (ref 1.000–1.030)
Total Protein, Urine: NEGATIVE
Urine Glucose: >=1000 — AB
Urobilinogen, UA: 0.2 (ref 0.0–1.0)
pH: 5.5 (ref 5.0–8.0)

## 2022-03-20 LAB — HEMOGLOBIN A1C: Hgb A1c MFr Bld: 6.7 % — ABNORMAL HIGH (ref 4.6–6.5)

## 2022-03-20 LAB — HEPATIC FUNCTION PANEL
ALT: 28 U/L (ref 0–53)
AST: 24 U/L (ref 0–37)
Albumin: 4.8 g/dL (ref 3.5–5.2)
Alkaline Phosphatase: 54 U/L (ref 39–117)
Bilirubin, Direct: 0.2 mg/dL (ref 0.0–0.3)
Total Bilirubin: 0.8 mg/dL (ref 0.2–1.2)
Total Protein: 7.5 g/dL (ref 6.0–8.3)

## 2022-03-20 LAB — TSH: TSH: 2.22 u[IU]/mL (ref 0.35–5.50)

## 2022-03-20 LAB — VITAMIN B12: Vitamin B-12: 811 pg/mL (ref 211–911)

## 2022-03-20 LAB — PSA: PSA: 0.66 ng/mL (ref 0.10–4.00)

## 2022-03-20 LAB — VITAMIN D 25 HYDROXY (VIT D DEFICIENCY, FRACTURES): VITD: 44.05 ng/mL (ref 30.00–100.00)

## 2022-03-23 DIAGNOSIS — G4733 Obstructive sleep apnea (adult) (pediatric): Secondary | ICD-10-CM | POA: Diagnosis not present

## 2022-03-27 ENCOUNTER — Other Ambulatory Visit (INDEPENDENT_AMBULATORY_CARE_PROVIDER_SITE_OTHER): Payer: Medicare Other

## 2022-03-27 DIAGNOSIS — N179 Acute kidney failure, unspecified: Secondary | ICD-10-CM | POA: Diagnosis not present

## 2022-03-27 LAB — BASIC METABOLIC PANEL
BUN: 13 mg/dL (ref 6–23)
CO2: 27 mEq/L (ref 19–32)
Calcium: 9.5 mg/dL (ref 8.4–10.5)
Chloride: 102 mEq/L (ref 96–112)
Creatinine, Ser: 1.23 mg/dL (ref 0.40–1.50)
GFR: 60.72 mL/min (ref >60.00)
Glucose, Bld: 172 mg/dL — ABNORMAL HIGH (ref 70–99)
Potassium: 4.5 mEq/L (ref 3.5–5.1)
Sodium: 136 mEq/L (ref 135–145)

## 2022-03-30 ENCOUNTER — Encounter: Payer: Self-pay | Admitting: Internal Medicine

## 2022-03-30 ENCOUNTER — Ambulatory Visit (INDEPENDENT_AMBULATORY_CARE_PROVIDER_SITE_OTHER): Payer: Medicare Other | Admitting: Internal Medicine

## 2022-03-30 VITALS — BP 132/82 | HR 55 | Temp 98.2°F | Ht 70.0 in | Wt 214.0 lb

## 2022-03-30 DIAGNOSIS — R7303 Prediabetes: Secondary | ICD-10-CM

## 2022-03-30 DIAGNOSIS — M109 Gout, unspecified: Secondary | ICD-10-CM | POA: Diagnosis not present

## 2022-03-30 DIAGNOSIS — E785 Hyperlipidemia, unspecified: Secondary | ICD-10-CM

## 2022-03-30 DIAGNOSIS — Z0001 Encounter for general adult medical examination with abnormal findings: Secondary | ICD-10-CM | POA: Diagnosis not present

## 2022-03-30 DIAGNOSIS — E538 Deficiency of other specified B group vitamins: Secondary | ICD-10-CM

## 2022-03-30 DIAGNOSIS — I428 Other cardiomyopathies: Secondary | ICD-10-CM

## 2022-03-30 DIAGNOSIS — Z23 Encounter for immunization: Secondary | ICD-10-CM | POA: Diagnosis not present

## 2022-03-30 DIAGNOSIS — N179 Acute kidney failure, unspecified: Secondary | ICD-10-CM

## 2022-03-30 DIAGNOSIS — E559 Vitamin D deficiency, unspecified: Secondary | ICD-10-CM | POA: Diagnosis not present

## 2022-03-30 MED ORDER — CARVEDILOL 25 MG PO TABS: 25.0000 mg | ORAL_TABLET | Freq: Two times a day (BID) | ORAL | 3 refills | Status: DC

## 2022-03-30 MED ORDER — SPIRONOLACTONE 25 MG PO TABS: 12.5000 mg | ORAL_TABLET | Freq: Every day | ORAL | 3 refills | Status: DC

## 2022-03-30 MED ORDER — PRAVASTATIN SODIUM 40 MG PO TABS: 40.0000 mg | ORAL_TABLET | Freq: Every evening | ORAL | 3 refills | Status: DC

## 2022-03-30 MED ORDER — ALLOPURINOL 300 MG PO TABS: 300.0000 mg | ORAL_TABLET | Freq: Every day | ORAL | 3 refills | Status: DC

## 2022-03-30 MED ORDER — DAPAGLIFLOZIN PROPANEDIOL 10 MG PO TABS: 10.0000 mg | ORAL_TABLET | Freq: Every day | ORAL | 3 refills | Status: DC

## 2022-03-30 MED ORDER — PANTOPRAZOLE SODIUM 40 MG PO TBEC: 40.0000 mg | DELAYED_RELEASE_TABLET | Freq: Every day | ORAL | 3 refills | Status: DC

## 2022-03-30 NOTE — Assessment & Plan Note (Signed)
Uncontrolled with recent glucose 173 - for f/u a1c with labs today, cont same tx Farxiga 10 mg qd

## 2022-03-30 NOTE — Assessment & Plan Note (Signed)
Noted on initial lab 1 wk ago, with cr 1.90 then improved to baseline 1.23 last Friday after holding 3 days lasix and increased po fluids

## 2022-03-30 NOTE — Assessment & Plan Note (Signed)
Stable rate and volume, cont lasix and f/u cardiology as planned

## 2022-03-30 NOTE — Assessment & Plan Note (Signed)
Age and sex appropriate education and counseling updated with regular exercise and diet Referrals for preventative services - none needed Immunizations addressed - for flu shot, pneumovax, and tdap Smoking counseling  - none needed Evidence for depression or other mood disorder - none significant Most recent labs reviewed. I have personally reviewed and have noted: 1) the patient's medical and social history 2) The patient's current medications and supplements 3) The patient's height, weight, and BMI have been recorded in the chart

## 2022-03-30 NOTE — Assessment & Plan Note (Signed)
Lab Results  Component Value Date   LDLCALC 69 03/20/2022   Stable, pt to continue current statin pravachol 40 mg qd

## 2022-03-30 NOTE — Progress Notes (Signed)
Patient ID: Matthew Guerrero, male   DOB: Oct 04, 1955, 66 y.o.   MRN: [id number]         Chief Complaint:: wellness exam and hyperglycemia, AKI, chf       HPI:  Matthew Guerrero is a 66 y.o. male here for wellness exam; for flu shot, pneumovax and Tdap today; o/w up to date                        Also Denies urinary symptoms such as dysuria, frequency, urgency, flank pain, hematuria or n/v, fever, chills.  Gout attack has resolved,.  Good compliance with meds.  Pt denies chest pain, increased sob or doe, wheezing, orthopnea, PND, increased LE swelling, palpitations, dizziness or syncope.   Pt denies polydipsia, polyuria, or new focal neuro s/s.    Pt denies fever, wt loss, night sweats, loss of appetite, or other constitutional symptoms     Wt Readings from Last 3 Encounters:  03/30/22 214 lb (97.1 kg)  11/03/21 209 lb (94.8 kg)  10/08/21 216 lb 12.8 oz (98.3 kg)   BP Readings from Last 3 Encounters:  03/30/22 132/82  11/03/21 118/68  10/08/21 110/70   Immunization History  Administered Date(s) Administered   Fluad Quad(high Dose 65+) 04/04/2020, 03/28/2021, 03/30/2022   Influenza Split 04/28/2012   Influenza Whole 03/22/2009, 04/18/2010   Influenza,inj,Quad PF,6+ Mos 02/25/2014, 02/28/2015, 03/29/2018, 03/09/2019   Influenza-Unspecified 02/10/2017, 03/29/2018, 03/09/2019   Moderna Sars-Covid-2 Vaccination 09/08/2019, 10/09/2019, 04/23/2020   Pneumococcal Conjugate-13 04/04/2020   Pneumococcal Polysaccharide-23 02/28/2015, 03/30/2022   Td 07/01/2009   Tdap 03/30/2022   Zoster Recombinat (Shingrix) 09/29/2016, 12/02/2016  There are no preventive care reminders to display for this patient.    Past Medical History:  Diagnosis Date   ALLERGIC RHINITIS    Asthma    B12 deficiency 04/06/2020   Cervical radiculitis    CHF (congestive heart failure) (HCC)    Chronic systolic heart failure ([place]) 02/28/2015   GERD (gastroesophageal reflux disease)    GOUT    Hyperglycemia 08/28/2014    HYPERLIPIDEMIA    HYPERTENSION    Lateral epicondylitis of right elbow    Nonischemic cardiomyopathy ([place]) 09/28/2014   Obesity (BMI 30-39.9) 06/06/2015   S/P cardiac cath wjith normal coronary arteries  09/28/2014   Sleep apnea    Thrombocytopenia ([place]) 04/06/2020   Past Surgical History:  Procedure Laterality Date   COLONOSCOPY     COLONOSCOPY WITH PROPOFOL N/A 06/17/2017   Procedure: COLONOSCOPY WITH PROPOFOL;  Surgeon: Milus Banister, MD;  Location: [facility];  Service: Endoscopy;  Laterality: N/A;   EP IMPLANTABLE DEVICE N/A 03/21/2015   Procedure: SubQ ICD Implant;  Surgeon: Deboraha Sprang, MD;  Location: [place] [facility];  Service: Cardiovascular;  Laterality: N/A;   LEFT AND RIGHT HEART CATHETERIZATION WITH CORONARY ANGIOGRAM N/A 09/27/2014   Procedure: LEFT AND RIGHT HEART CATHETERIZATION WITH CORONARY ANGIOGRAM;  Surgeon: Belva Crome, MD;  Location: [facility] CATH LAB;  Service: Cardiovascular;  Laterality: N/A;   SUBQ ICD  03/21/2015   SUBQ ICD IMPLANT N/A 02/10/2021   Procedure: SUBQ ICD IMPLANT;  Surgeon: Deboraha Sprang, MD;  Location: [place] [facility];  Service: Cardiovascular;  Laterality: N/A;    reports that he has never smoked. He has never used smokeless tobacco. He reports that he does not drink alcohol and does not use drugs. family history includes Aneurysm (age of onset: 31) in his sister; Diabetes in his father; Healthy in his brother,  brother, and brother; Heart attack (age of onset: 44) in his father; Heart disease in his father; Hypertension in his sister; Lung cancer in his mother. No Known Allergies Current Outpatient Medications on File Prior to Visit  Medication Sig Dispense Refill   aspirin EC 81 MG tablet Take 81 mg by mouth daily. Swallow whole.     ENTRESTO 97-103 MG TAKE 1 TABLET BY MOUTH TWICE  DAILY 200 tablet 2   furosemide (LASIX) 40 MG tablet TAKE 1 TABLET BY MOUTH  DAILY 90 tablet 1   indomethacin (INDOCIN) 50 MG capsule Take 1 capsule (50 mg  total) by mouth 3 (three) times daily as needed. 270 capsule 1   vitamin B-12 (CYANOCOBALAMIN) 1000 MCG tablet Take 1 tablet (1,000 mcg total) by mouth daily. 90 tablet 1   No current facility-administered medications on file prior to visit.        ROS:  All others reviewed and negative.  Objective        PE:  BP 132/82 (BP Location: Left Arm, Patient Position: Sitting, Cuff Size: Large)   Pulse (!) 55   Temp 98.2 F (36.8 C) (Oral)   Ht '5\' 10"'$  (1.778 m)   Wt 214 lb (97.1 kg)   SpO2 97%   BMI 30.71 kg/m                 Constitutional: Pt appears in NAD               HENT: Head: NCAT.                Right Ear: External ear normal.                 Left Ear: External ear normal.                Eyes: . Pupils are equal, round, and reactive to light. Conjunctivae and EOM are normal               Nose: without d/c or deformity               Neck: Neck supple. Gross normal ROM               Cardiovascular: Normal rate and regular rhythm.                 Pulmonary/Chest: Effort normal and breath sounds without rales or wheezing.                Abd:  Soft, NT, ND, + BS, no organomegaly               Neurological: Pt is alert. At baseline orientation, motor grossly intact               Skin: Skin is warm. No rashes, no other new lesions, LE edema - none               Psychiatric: Pt behavior is normal without agitation   Micro: none  Cardiac tracings I have personally interpreted today:  none  Pertinent Radiological findings (summarize): none   Lab Results  Component Value Date   WBC 8.1 03/20/2022   HGB 16.1 03/20/2022   HCT 50.2 03/20/2022   PLT 148.0 (L) 03/20/2022   GLUCOSE 172 (H) 03/27/2022   CHOL 139 03/20/2022   TRIG 195.0 (H) 03/20/2022   HDL 31.60 (L) 03/20/2022   LDLDIRECT 73.0 04/01/2021   LDLCALC 69 03/20/2022   ALT 28 03/20/2022  AST 24 03/20/2022   NA 136 03/27/2022   K 4.5 03/27/2022   CL 102 03/27/2022   CREATININE 1.23 03/27/2022   BUN 13 03/27/2022    CO2 27 03/27/2022   TSH 2.22 03/20/2022   PSA 0.66 03/20/2022   INR 1.2 (H) 03/15/2015   HGBA1C 6.7 (H) 03/20/2022   Assessment/Plan:  Matthew Guerrero is a 66 y.o. Black or African American [2] male with  has a past medical history of ALLERGIC RHINITIS, Asthma, B12 deficiency (04/06/2020), Cervical radiculitis, CHF (congestive heart failure) ([place]), Chronic systolic heart failure ([place]) (02/28/2015), GERD (gastroesophageal reflux disease), GOUT, Hyperglycemia (08/28/2014), HYPERLIPIDEMIA, HYPERTENSION, Lateral epicondylitis of right elbow, Nonischemic cardiomyopathy ([place]) (09/28/2014), Obesity (BMI 30-39.9) (06/06/2015), S/P cardiac cath wjith normal coronary arteries  (09/28/2014), Sleep apnea, and Thrombocytopenia ([place]) (04/06/2020).  Prediabetes Uncontrolled with recent glucose 173 - for f/u a1c with labs today, cont same tx Farxiga 10 mg qd  GOUT Recent flare now resolved,  to f/u any worsening symptoms or concerns   AKI (acute kidney injury) ([place]) Noted on initial lab 1 wk ago, with cr 1.90 then improved to baseline 1.23 last Friday after holding 3 days lasix and increased po fluids  Encounter for well adult exam with abnormal findings Age and sex appropriate education and counseling updated with regular exercise and diet Referrals for preventative services - none needed Immunizations addressed - for flu shot, pneumovax, and tdap Smoking counseling  - none needed Evidence for depression or other mood disorder - none significant Most recent labs reviewed. I have personally reviewed and have noted: 1) the patient's medical and social history 2) The patient's current medications and supplements 3) The patient's height, weight, and BMI have been recorded in the chart   NICM (nonischemic cardiomyopathy) ([place]) Stable rate and volume, cont lasix and f/u cardiology as planned  Hyperlipidemia Lab Results  Component Value Date   LDLCALC 69 03/20/2022   Stable, pt to continue current  statin pravachol 40 mg qd   B12 deficiency Lab Results  Component Value Date   VITAMINB12 811 03/20/2022   Stable, cont oral replacement - b12 1000 mcg qd  Followup: Return in about 6 months (around 09/29/2022).  Cathlean Cower, MD 03/30/2022 4:35 PM [facility]

## 2022-03-30 NOTE — Patient Instructions (Addendum)
You had 3 shots today, Tdap tetanus, Pneumovax and Flu shot  Your lab test for the kidney function was back to baseline last friday  Please continue all other medications as before, and refills have been done if requested  Please have the pharmacy call with any other refills you may need.  Please continue your efforts at being more active, low cholesterol diet, and weight control.  You are otherwise up to date with prevention measures today.  Please keep your appointments with your specialists as you may have planned - Dr [facility]  Please make an Appointment to return in 6 months, or sooner if needed, also with Lab Appointment for testing done 3-5 days before at the [facility] (so this is for TWO appointments - please see the scheduling desk as you leave)

## 2022-03-30 NOTE — Assessment & Plan Note (Signed)
Lab Results  Component Value Date   [id number] 373 03/20/2022   Stable, cont oral replacement - b12 1000 mcg qd

## 2022-03-30 NOTE — Assessment & Plan Note (Signed)
Recent flare now resolved,  to f/u any worsening symptoms or concerns

## 2022-03-31 ENCOUNTER — Encounter: Payer: Self-pay | Admitting: Internal Medicine

## 2022-04-01 MED ORDER — FEXOFENADINE HCL 180 MG PO TABS: 180.0000 mg | ORAL_TABLET | Freq: Every day | ORAL | 2 refills | Status: DC

## 2022-04-01 MED ORDER — TRIAMCINOLONE ACETONIDE 55 MCG/ACT NA AERO: 2.0000 | INHALATION_SPRAY | Freq: Every day | NASAL | 12 refills | Status: DC

## 2022-04-01 NOTE — Telephone Encounter (Signed)
Patient is requesting allergy meds, please send to [facility] on [address].

## 2022-04-06 ENCOUNTER — Other Ambulatory Visit: Payer: Self-pay | Admitting: Internal Medicine

## 2022-04-16 ENCOUNTER — Encounter: Payer: Self-pay | Admitting: Internal Medicine

## 2022-05-12 ENCOUNTER — Ambulatory Visit (INDEPENDENT_AMBULATORY_CARE_PROVIDER_SITE_OTHER): Payer: Medicare Other

## 2022-05-12 DIAGNOSIS — I428 Other cardiomyopathies: Secondary | ICD-10-CM | POA: Diagnosis not present

## 2022-05-12 LAB — CUP PACEART REMOTE DEVICE CHECK
Battery Remaining Percentage: 88 %
Date Time Interrogation Session: 20231121080900
Implantable Lead Connection Status: 753985
Implantable Lead Implant Date: 20160929
Implantable Lead Location: 753862
Implantable Lead Model: 3401
Implantable Pulse Generator Implant Date: 20220822
Pulse Gen Serial Number: 166274

## 2022-05-14 ENCOUNTER — Other Ambulatory Visit: Payer: Self-pay | Admitting: Internal Medicine

## 2022-05-17 NOTE — Progress Notes (Signed)
Cardiology Office Note Date:  05/18/2022  Patient ID:  Matthew Guerrero, Matthew Guerrero 13-Mar-1956, MRN [id number] PCP:  Biagio Borg, MD  Cardiologist:  Dr. Debara Pickett Electrophysiologist: Dr. Caryl Comes OSA: Dr. Radford Pax   Chief Complaint:    annual device visit  History of Present Illness: Matthew Guerrero is a 66 y.o. male with history of HTN, HLD, gout, asthma, NICM, S-ICD, OSA w/CPAP, chronic CHF (systolic)  The patient comes in today to be seen for Dr. Caryl Comes.  He last saw hi in Sept 2019, doing well. Dr. Caryl Comes makes note that he had TWOS in the secondary vector and programmed in the alternate.    I saw him Oct 2020,  he felt very well.  Denied any CP, palpitations or SOB, no DOE or exertional intolerances, no symptoms of PND or orthopnea.  No near syncope or syncope, no shocks. His weight had fluctuated and he mentions that he he had taken a detoxify regime/pills a few months back that he lost several pounds with and then another pill that apparently was to reset everything back and regain his weight but in the right way. I cautioned him on taking such regimes without MD guidance and clearance. His device interrogation was normal, 54% battery used  I saw him 08/02/19 He feels quite well.  No CP, palpitations or exertional incapacities.  He denies any rest or exertional SOB, no symptoms of PND or orthopnea. No dizzy spells, near syncope or syncope. He weighs daily and reports stable weight He is tolerating his medicines, reports Delene Loll is covered well for him He was advised of device advisory, battery life looked OK at 50%, alert tone was demonstrated No changes were made.  He saw Dr. Debara Pickett 03/18/2020, was doing well, on GDMT, following as well with Dr. Radford Pax for his OSA.  Felt to have class I-II symptoms, and despite some improvement in his LVEF when gen change needed to be done, recommended proceeding with another ICD, given likelihood of his EF waxing/waning.  I saw him Oct 2021 He continues  to  Feel quite well.  No CP, palpitations or SOB.  Denies DOE, remains active around his house, yard work, errands.  No symptoms of PND or orthopnea, no dizzy spells, near syncope or syncope. His PMD monitors his labs regularly No shocks No changes were made, battery behavior was stable  He saw Dr. Debara Pickett in March 2022, doing well, class II symptoms, no changes were made.  I saw him April 2022 He continues to do really well No CP, palpitations or cardiac awareness No SOB No dizzy spells, near syncope or syncope. He has not heard any alert tones from his device Device battery depletion as expected No changes were made  underwent GEN change 01/2021  He saw Dr. Caryl Comes Nov 2022, doing well, no changed were made.  He saw Dr. Debara Pickett 09/18/21, doing well, planned to update his echo  TTE with LVEF 35-40%, global hypokinesis  TODAY He is doing very well. No CP, palpitations or cardiac awareness No SOB, DOE No near syncope or syncope.  He saw his PMD in October had labs  Device information BSCi S-ICD, implanted 03/21/15, Dr. Caryl Comes Device lead sensing configuration is on alternate 2/2 TWOS Implanted for primary prevention GEN change 02/10/21   Past Medical History:  Diagnosis Date   ALLERGIC RHINITIS    Asthma    B12 deficiency 04/06/2020   Cervical radiculitis    CHF (congestive heart failure) (HCC)    Chronic systolic heart failure ([place])  02/28/2015   GERD (gastroesophageal reflux disease)    GOUT    Hyperglycemia 08/28/2014   HYPERLIPIDEMIA    HYPERTENSION    Lateral epicondylitis of right elbow    Nonischemic cardiomyopathy ([place]) 09/28/2014   Obesity (BMI 30-39.9) 06/06/2015   S/P cardiac cath wjith normal coronary arteries  09/28/2014   Sleep apnea    Thrombocytopenia ([place]) 04/06/2020    Past Surgical History:  Procedure Laterality Date   COLONOSCOPY     COLONOSCOPY WITH PROPOFOL N/A 06/17/2017   Procedure: COLONOSCOPY WITH PROPOFOL;  Surgeon: Milus Banister, MD;   [facility];  Service: Endoscopy;  Laterality: N/A;   EP IMPLANTABLE DEVICE N/A 03/21/2015   Procedure: SubQ ICD Implant;  Surgeon: Deboraha Sprang, MD;  Location: [place] [facility];  Service: Cardiovascular;  Laterality: N/A;   LEFT AND RIGHT HEART CATHETERIZATION WITH CORONARY ANGIOGRAM N/A 09/27/2014   Procedure: LEFT AND RIGHT HEART CATHETERIZATION WITH CORONARY ANGIOGRAM;  Surgeon: Belva Crome, MD;  Location: [facility] CATH LAB;  Service: Cardiovascular;  Laterality: N/A;   SUBQ ICD  03/21/2015   SUBQ ICD IMPLANT N/A 02/10/2021   Procedure: SUBQ ICD IMPLANT;  Surgeon: Deboraha Sprang, MD;  Location: [place] [facility];  Service: Cardiovascular;  Laterality: N/A;    Current Outpatient Medications  Medication Sig Dispense Refill   allopurinol (ZYLOPRIM) 300 MG tablet Take 1 tablet (300 mg total) by mouth daily. 90 tablet 3   aspirin EC 81 MG tablet Take 81 mg by mouth daily. Swallow whole.     carvedilol (COREG) 25 MG tablet Take 1 tablet (25 mg total) by mouth 2 (two) times daily with a meal. Annual appt w/labs due in Oct must see provider for future refills 180 tablet 3   dapagliflozin propanediol (FARXIGA) 10 MG TABS tablet Take 1 tablet (10 mg total) by mouth daily before breakfast. Please call [phone] to schedule a November appointment with Dr. [name] for future refills. Thank you. 100 tablet 3   ENTRESTO 97-103 MG TAKE 1 TABLET BY MOUTH TWICE  DAILY 200 tablet 2   fexofenadine (ALLEGRA) 180 MG tablet Take 1 tablet (180 mg total) by mouth daily. 30 tablet 2   furosemide (LASIX) 40 MG tablet TAKE 1 TABLET BY MOUTH DAILY 90 tablet 0   indomethacin (INDOCIN) 50 MG capsule Take 1 capsule (50 mg total) by mouth 3 (three) times daily as needed. 300 capsule 3   pantoprazole (PROTONIX) 40 MG tablet Take 1 tablet (40 mg total) by mouth daily. Annual appt w/labs due in Oct must see provider for future refills 90 tablet 3   pravastatin (PRAVACHOL) 40 MG tablet Take 1 tablet (40 mg  total) by mouth every evening. Annual appt w/labs due in Oct must see provider for future refills 90 tablet 3   spironolactone (ALDACTONE) 25 MG tablet Take 0.5 tablets (12.5 mg total) by mouth daily. 45 tablet 3   vitamin B-12 (CYANOCOBALAMIN) 1000 MCG tablet Take 1 tablet (1,000 mcg total) by mouth daily. 90 tablet 1   triamcinolone (NASACORT) 55 MCG/ACT AERO nasal inhaler Place 2 sprays into the nose daily. (Patient not taking: Reported on 05/18/2022) 1 each 12   No current facility-administered medications for this visit.    Allergies:   Patient has no known allergies.   Social History:  The patient  reports that he has never smoked. He has never used smokeless tobacco. He reports that he does not drink alcohol and does not use drugs.  Family History:  The patient's family history includes Aneurysm (age of onset: 13) in his sister; Diabetes in his father; Healthy in his brother, brother, and brother; Heart attack (age of onset: 74) in his father; Heart disease in his father; Hypertension in his sister; Lung cancer in his mother.  ROS:  Please see the history of present illness.  All other systems are reviewed and otherwise negative.   PHYSICAL EXAM:  VS:  BP 128/86   Pulse (!) 59   Ht '5\' 10"'$  (1.778 m)   Wt 218 lb 12.8 oz (99.2 kg)   SpO2 96%   BMI 31.39 kg/m  BMI: Body mass index is 31.39 kg/m. Well nourished, well developed, in no acute distress  HEENT: normocephalic, atraumatic  Neck: no JVD, carotid bruits or masses Cardiac:  RRR; no significant murmurs, no rubs, or gallops Lungs: CTA b/l, no wheezing, rhonchi or rales  Abd: soft, nontender MS: no deformity or atrophy Ext:   no edema  Skin: warm and dry, no rash Neuro:  No gross deficits appreciated Psych: euthymic mood, full affect  S-ICD site is stable, no tethering or discomfort   EKG:  Not done today  ICD interrogation done today and reviewed by myself:  Battery is at 88% Electrode impedence 85Ohms No  episodes   10/03/21: TTE 1. GLS average appears to overestimate function, likely inaccurate. Left  ventricular ejection fraction, by estimation, is 35 to 40%. The left  ventricle has moderately decreased function. The left ventricle  demonstrates global hypokinesis. There is mild  left ventricular hypertrophy. Left ventricular diastolic parameters are  consistent with Grade I diastolic dysfunction (impaired relaxation).   2. Right ventricular systolic function is mildly reduced. The right  ventricular size is normal. Tricuspid regurgitation signal is inadequate  for assessing PA pressure.   3. Hypermoble septum, no significant PFO visualized.   4. The mitral valve is degenerative. Trivial mitral valve regurgitation.   5. Aortic valve regurgitation is not visualized. Aortic valve  sclerosis/calcification is present, without any evidence of aortic  stenosis.   6. The inferior vena cava is normal in size with greater than 50%  respiratory variability, suggesting right atrial pressure of 3 mmHg.   Comparison(s): No significant change from prior study. 03/05/20 EF 35-40%.   03/05/2020 TTE IMPRESSIONS   1. Left ventricular ejection fraction, by estimation, is 35 to 40%. The  left ventricle has moderately decreased function. The left ventricle  demonstrates global hypokinesis. Left ventricular diastolic parameters are  consistent with Grade I diastolic  dysfunction (impaired relaxation).   2. Right ventricular systolic function is normal. The right ventricular  size is normal. Tricuspid regurgitation signal is inadequate for assessing  PA pressure.   3. The mitral valve is grossly normal. Trivial mitral valve  regurgitation. No evidence of mitral stenosis.   4. The aortic valve is tricuspid. Aortic valve regurgitation is trivial.  No aortic stenosis is present.   5. The inferior vena cava is normal in size with greater than 50%  respiratory variability, suggesting right atrial pressure of  3 mmHg.   Comparison(s): Changes from prior study are noted. EF has improved to  35-40%. LV dimensions within normal limits.    03/14/18: TTE Study Conclusions - Left ventricle: The cavity size was severely dilated. Wall   thickness was normal. The estimated ejection fraction was 25%.   Diffuse hypokinesis. - Atrial septum: No defect or patent foramen ovale was identified.     Recent Labs: 03/20/2022: ALT 28; Hemoglobin  16.1; Platelets 148.0; TSH 2.22 03/27/2022: BUN 13; Creatinine, Ser 1.23; Potassium 4.5; Sodium 136  03/20/2022: Cholesterol 139; HDL 31.60; LDL Cholesterol 69; Total CHOL/HDL Ratio 4; Triglycerides 195.0; VLDL 39.0   CrCl cannot be calculated (Patient's most recent lab result is older than the maximum 21 days allowed.).   Wt Readings from Last 3 Encounters:  05/18/22 218 lb 12.8 oz (99.2 kg)  03/30/22 214 lb (97.1 kg)  11/03/21 209 lb (94.8 kg)     Other studies reviewed: Additional studies/records reviewed today include: summarized above  ASSESSMENT AND PLAN:  1. S-ICD     Intact function No programming changes made       2. NICM 3. Chronic CHF (systolic)     On BB, entresto, diuretics     No symptoms or exam findings to suggest fluid OL     Follows with Dr. Debara Pickett            4. HTN    Looks good    Disposition: remotes as usual, in clinic with EP in a year, sooner if needed    Current medicines are reviewed at length with the patient today.  The patient did not have any concerns regarding medicines.  Venetia Night, PA-C 05/18/2022 5:56 PM     [address] [place] [address] [phone] (office)  [phone] (fax)

## 2022-05-18 ENCOUNTER — Ambulatory Visit: Payer: Medicare Other | Attending: Physician Assistant | Admitting: Physician Assistant

## 2022-05-18 ENCOUNTER — Encounter: Payer: Self-pay | Admitting: Physician Assistant

## 2022-05-18 VITALS — BP 128/86 | HR 59 | Ht 70.0 in | Wt 218.8 lb

## 2022-05-18 DIAGNOSIS — I10 Essential (primary) hypertension: Secondary | ICD-10-CM | POA: Diagnosis not present

## 2022-05-18 DIAGNOSIS — Z95810 Presence of automatic (implantable) cardiac defibrillator: Secondary | ICD-10-CM

## 2022-05-18 DIAGNOSIS — I428 Other cardiomyopathies: Secondary | ICD-10-CM

## 2022-05-18 DIAGNOSIS — I5022 Chronic systolic (congestive) heart failure: Secondary | ICD-10-CM

## 2022-05-18 LAB — CUP PACEART INCLINIC DEVICE CHECK
Date Time Interrogation Session: 20231127175825
Implantable Lead Connection Status: 753985
Implantable Lead Implant Date: 20160929
Implantable Lead Location: 753862
Implantable Lead Model: 3401
Implantable Pulse Generator Implant Date: 20220822
Pulse Gen Serial Number: 166274

## 2022-05-18 NOTE — Patient Instructions (Signed)
Medication Instructions:    Your physician recommends that you continue on your current medications as directed. Please refer to the Current Medication list given to you today.   *If you need a refill on your cardiac medications before your next appointment, please call your pharmacy*   Lab Work: [place]    If you have labs (blood work) drawn today and your tests are completely normal, you will receive your results only by: [place] (if you have MyChart) OR A paper copy in the mail If you have any lab test that is abnormal or we need to change your treatment, we will call you to review the results.   Testing/Procedures: NONE ORDERED  TODAY    Follow-Up: At [facility], you and your health needs are our priority.  As part of our continuing mission to provide you with exceptional heart care, we have created designated Provider Care Teams.  These Care Teams include your primary Cardiologist (physician) and Advanced Practice Providers (APPs -  Physician Assistants and Nurse Practitioners) who all work together to provide you with the care you need, when you need it.  We recommend signing up for the patient portal called "MyChart".  Sign up information is provided on this After Visit Summary.  MyChart is used to connect with patients for Virtual Visits (Telemedicine).  Patients are able to view lab/test results, encounter notes, upcoming appointments, etc.  Non-urgent messages can be sent to your provider as well.   To learn more about what you can do with MyChart, go to [email or web address].    Your next appointment:   1 year(s)  The format for your next appointment:   In Person  Provider:   You may see [name], MD or one of the following Advanced Practice Providers on your designated Care Team:   Tommye Standard, [place] Legrand Como "Jonni Sanger" Chalmers Cater, [place]   Other Instructions   Important Information About Sugar

## 2022-06-05 ENCOUNTER — Other Ambulatory Visit: Payer: Self-pay | Admitting: Internal Medicine

## 2022-06-05 NOTE — Progress Notes (Signed)
Remote ICD transmission.   

## 2022-06-10 ENCOUNTER — Telehealth: Payer: Self-pay | Admitting: *Deleted

## 2022-06-10 ENCOUNTER — Encounter: Payer: Self-pay | Admitting: *Deleted

## 2022-06-10 NOTE — Patient Outreach (Addendum)
  Care Coordination   Initial Visit Note   06/10/2022 Name: Matthew Guerrero MRN: [id number] DOB: 05-22-1956  Matthew Guerrero is a 66 y.o. year old male who sees Matthew Borg, MD for primary care. I spoke with  Matthew Guerrero by phone today.  What matters to the patients health and wellness today?  "I am doing really well and not having any problems, taking good care of myself and practicing portion control which is working well for me- I think I am even losing some weight.  I am not eating any extra salt and watching to make sure I am not eating too much sugar.  I stay active, walk fine, have had no falls and don't use any kind of assistive devices.  No worries or questions about my medicines- there haven't been any changes and I am taking them like I am supposed to.  I have all my vaccines and don't need any information about the colonoscopy-- I have had several before, so I know the routine.  Interventions provided; no further or ongoing care coordination needs identified today   Goals Addressed             This Visit's Progress    COMPLETED: Care Coordination Activities: No follow up required   On track    Care Coordination Interventions: Evaluation of current treatment plan related to HF, HTN with ICD and patient's adherence to plan as established by provider Advised patient to continue adhering to existing plan of care as established Provided education to patient re: reinforced heart healthy, low salt diet in setting of HTN/ CHF; discussed benefits of activity in setting of HTN/ CHF; confirmed patient has no questions about upcoming colonoscopy and denies need for education around same; reviewed last PCP office visit and cardiology provider office visit with patient: he verbalizes good understanding of same and denies questions post- recent office visits Reviewed medications with patient and discussed adherence: he declines full medication review but reports he self-manages all all  aspects of medication administration, denies questions and concerns around medications Reviewed scheduled/upcoming provider appointments including 06/30/22- pre colonoscopy screening; 07/13/22- cardiology provider; 07/14/22- colonoscopy Screening for signs and symptoms of depression related to chronic disease state  Assessed social determinant of health barriers Provided education/ reinforcement and discussed infection prevention measures/ practices: provided positive reinforcement that patient is up to date on vaccination status           SDOH assessments and interventions completed:  Yes  SDOH Interventions Today    Flowsheet Row Most Recent Value  SDOH Interventions   Food Insecurity Interventions Intervention Not Indicated  Transportation Interventions Intervention Not Indicated  [drives self]       Care Coordination Interventions:  Yes, provided   Follow up plan: No further intervention required.   Encounter Outcome:  Pt. Visit Completed   Oneta Rack, RN, BSN, CCRN Alumnus RN CM Care Coordination/ Transition of [place] Management [phone]: direct office

## 2022-06-10 NOTE — Patient Instructions (Signed)
Visit Information  Thank you for taking time to visit with me today. Please don't hesitate to contact me if I can be of assistance to you.   Following are the goals we discussed today:   Goals Addressed             This Visit's Progress    COMPLETED: Care Coordination Activities: No follow up required   On track    Care Coordination Interventions: Evaluation of current treatment plan related to HF, HTN with ICD and patient's adherence to plan as established by provider Advised patient to continue adhering to existing plan of care as established Provided education to patient re: reinforced heart healthy, low salt diet in setting of HTN/ CHF; discussed benefits of activity in setting of HTN/ CHF; confirmed patient has no questions about upcoming colonoscopy and denies need for education around same; reviewed last PCP office visit and cardiology provider office visit with patient: he verbalizes good understanding of same and denies questions post- recent office visits Reviewed medications with patient and discussed adherence: he declines full medication review but reports he self-manages all all aspects of medication administration, denies questions and concerns around medications Reviewed scheduled/upcoming provider appointments including 06/30/22- pre colonoscopy screening; 07/13/22- cardiology provider; 07/14/22- colonoscopy Screening for signs and symptoms of depression related to chronic disease state  Assessed social determinant of health barriers Provided education/ reinforcement and discussed infection prevention measures/ practices: provided positive reinforcement that patient is up to date on vaccination status           If you are experiencing a Mental Health or [place] or need someone to talk to, please  call the Suicide and Crisis Lifeline: 988 call the [place] National Suicide Prevention Lifeline: (513)545-9185 or TTY: 365-276-8469 TTY 8608379333) to talk to a  trained counselor call 1-800-273-TALK (toll free, 24 hour hotline) go to [facility] [address] [place] [phone]) call the [place]: [phone] call 911   Patient verbalizes understanding of instructions and care plan provided today and agrees to view in [place]. Active MyChart status and patient understanding of how to access instructions and care plan via MyChart confirmed with patient.     No further follow up required: no further or ongoing care coordination needs identified today  Oneta Rack, RN, BSN, CCRN Alumnus RN CM Care Coordination/ Transition of [place] Management [phone]: direct office

## 2022-06-15 ENCOUNTER — Other Ambulatory Visit: Payer: Self-pay | Admitting: Internal Medicine

## 2022-06-30 ENCOUNTER — Ambulatory Visit (AMBULATORY_SURGERY_CENTER): Payer: Medicare Other | Admitting: *Deleted

## 2022-06-30 VITALS — Ht 70.0 in | Wt 218.2 lb

## 2022-06-30 DIAGNOSIS — Z86010 Personal history of colonic polyps: Secondary | ICD-10-CM

## 2022-06-30 MED ORDER — NA SULFATE-K SULFATE-MG SULF 17.5-3.13-1.6 GM/177ML PO SOLN: 1.0000 | Freq: Once | ORAL | 0 refills | Status: AC

## 2022-06-30 NOTE — Progress Notes (Signed)
Pt's previsit is done over the phone and all paperwork (prep instructions) sent to patient.  Pt's name and DOB verified at the beginning of the previsit.  Pt denies any difficulty with ambulating.   No egg or soy allergy known to patient  No issues known to pt with past sedation with any surgeries or procedures Patient denies ever being told they had issues or difficulty with intubation  No FH of Malignant Hyperthermia Pt is not on diet pills Pt is not on  home 02  Pt is not on blood thinners  Pt denies issues with constipation  Pt is not on dialysis Pt denies any upcoming cardiac testing Pt encouraged to use to use Singlecare or Goodrx to reduce cost   Patient's chart reviewed by Osvaldo Angst CNRA prior to previsit and patient appropriate for the [place].  Previsit completed and red dot placed by patient's name on their procedure day (on provider's schedule).

## 2022-07-07 DIAGNOSIS — G4733 Obstructive sleep apnea (adult) (pediatric): Secondary | ICD-10-CM | POA: Diagnosis not present

## 2022-07-13 ENCOUNTER — Encounter: Payer: Self-pay | Admitting: Cardiology

## 2022-07-13 ENCOUNTER — Ambulatory Visit: Payer: 59 | Attending: Cardiology | Admitting: Cardiology

## 2022-07-13 ENCOUNTER — Encounter: Payer: Self-pay | Admitting: Gastroenterology

## 2022-07-13 VITALS — BP 113/71 | HR 79 | Ht 70.0 in | Wt 214.4 lb

## 2022-07-13 DIAGNOSIS — G4733 Obstructive sleep apnea (adult) (pediatric): Secondary | ICD-10-CM | POA: Diagnosis not present

## 2022-07-13 DIAGNOSIS — I10 Essential (primary) hypertension: Secondary | ICD-10-CM

## 2022-07-13 NOTE — Progress Notes (Signed)
Date:  07/13/2022   ID:  Matthew Guerrero, DOB 06-05-1956, MRN [id number]  PCP:  Biagio Borg, MD  Cardiologist:  Pixie Casino, MD  Sleep Medicine:  Fransico Him, MD Electrophysiologist:  [name], MD   Chief Complaint:  OSA  History of Present Illness:    Matthew Guerrero is a 67 y.o. male with a hx of moderate OSA with an AHI of 23/hr and mild to moderate snoring and associated nocturnal hypoxemia with his respiratory events as low as 71%.  He was titrated to 9 cm H2O.    He is doing well with his PAP device and thinks that he has gotten used to it.  He tolerates the mask and feels the pressure is adequate.  Since going on PAP he feels rested in the am and has no significant daytime sleepiness.  He denies any significant mouth or nasal dryness or nasal congestion.  He does not think that he snores.    Prior CV studies:   The following studies were reviewed today:  PAP compliance data  Past Medical History:  Diagnosis Date   ALLERGIC RHINITIS    Allergy    B12 deficiency 04/06/2020   Cervical radiculitis    CHF (congestive heart failure) (HCC)    Chronic systolic heart failure ([place]) 02/28/2015   GERD (gastroesophageal reflux disease)    GOUT    Hyperglycemia 08/28/2014   HYPERLIPIDEMIA    HYPERTENSION    Lateral epicondylitis of right elbow    Nonischemic cardiomyopathy ([place]) 09/28/2014   Obesity (BMI 30-39.9) 06/06/2015   S/P cardiac cath wjith normal coronary arteries  09/28/2014   Sleep apnea    wears CPAP   Thrombocytopenia ([place]) 04/06/2020   Past Surgical History:  Procedure Laterality Date   COLONOSCOPY     COLONOSCOPY WITH PROPOFOL N/A 06/17/2017   Procedure: COLONOSCOPY WITH PROPOFOL;  Surgeon: Milus Banister, MD;  [facility]: [facility];  Service: Endoscopy;  Laterality: N/A;   EP IMPLANTABLE DEVICE N/A 03/21/2015   Procedure: SubQ ICD Implant;  Surgeon: Deboraha Sprang, MD;  Location: [place] CV LAB;  Service: Cardiovascular;  Laterality: N/A;    LEFT AND RIGHT HEART CATHETERIZATION WITH CORONARY ANGIOGRAM N/A 09/27/2014   Procedure: LEFT AND RIGHT HEART CATHETERIZATION WITH CORONARY ANGIOGRAM;  Surgeon: Belva Crome, MD;  Location: [facility] CATH LAB;  Service: Cardiovascular;  Laterality: N/A;   SUBQ ICD  03/21/2015   SUBQ ICD IMPLANT N/A 02/10/2021   Procedure: SUBQ ICD IMPLANT;  Surgeon: Deboraha Sprang, MD;  Location: [place] [facility] LAB;  Service: Cardiovascular;  Laterality: N/A;     Current Meds  Medication Sig   ALLERGY RELIEF 180 MG tablet TAKE 1 TABLET(180 MG) BY MOUTH DAILY   allopurinol (ZYLOPRIM) 300 MG tablet Take 1 tablet (300 mg total) by mouth daily.   aspirin EC 81 MG tablet Take 81 mg by mouth daily. Swallow whole.   carvedilol (COREG) 25 MG tablet Take 1 tablet (25 mg total) by mouth 2 (two) times daily with a meal. Annual appt w/labs due in Oct must see provider for future refills   dapagliflozin propanediol (FARXIGA) 10 MG TABS tablet Take 1 tablet (10 mg total) by mouth daily before breakfast. Please call [phone] to schedule a November appointment with Dr. [name] for future refills. Thank you.   ENTRESTO 97-103 MG TAKE 1 TABLET BY MOUTH TWICE  DAILY   furosemide (LASIX) 40 MG tablet Take 1 tablet (40 mg total) by mouth daily.  indomethacin (INDOCIN) 50 MG capsule Take 1 capsule (50 mg total) by mouth 3 (three) times daily as needed.   pantoprazole (PROTONIX) 40 MG tablet Take 1 tablet (40 mg total) by mouth daily. Annual appt w/labs due in Oct must see provider for future refills   pravastatin (PRAVACHOL) 40 MG tablet Take 1 tablet (40 mg total) by mouth every evening. Annual appt w/labs due in Oct must see provider for future refills   spironolactone (ALDACTONE) 25 MG tablet Take 0.5 tablets (12.5 mg total) by mouth daily.   vitamin B-12 (CYANOCOBALAMIN) 1000 MCG tablet Take 1 tablet (1,000 mcg total) by mouth daily.     Allergies:   Patient has no known allergies.   Social History   Tobacco Use    Smoking status: Never   Smokeless tobacco: Never  Vaping Use   Vaping Use: Never used  Substance Use Topics   Alcohol use: No    Alcohol/week: 0.0 standard drinks of alcohol   Drug use: No     Family Hx: The patient's family history includes Aneurysm (age of onset: 11) in his sister; Diabetes in his father; Healthy in his brother, brother, and brother; Heart attack (age of onset: 73) in his father; Heart disease in his father; Hypertension in his sister; Lung cancer in his mother. There is no history of Colon cancer, Esophageal cancer, Rectal cancer, or Stomach cancer.  ROS:   Please see the history of present illness.     All other systems reviewed and are negative.   Labs/Other Tests and Data Reviewed:    Recent Labs: 03/20/2022: ALT 28; Hemoglobin 16.1; Platelets 148.0; TSH 2.22 03/27/2022: BUN 13; Creatinine, Ser 1.23; Potassium 4.5; Sodium 136   Recent Lipid Panel Lab Results  Component Value Date/Time   CHOL 139 03/20/2022 09:45 AM   CHOL 134 10/21/2016 02:38 PM   TRIG 195.0 (H) 03/20/2022 09:45 AM   HDL 31.60 (L) 03/20/2022 09:45 AM   HDL 35 (L) 10/21/2016 02:38 PM   CHOLHDL 4 03/20/2022 09:45 AM   LDLCALC 69 03/20/2022 09:45 AM   LDLCALC 68 10/21/2016 02:38 PM   LDLDIRECT 73.0 04/01/2021 07:47 AM    Wt Readings from Last 3 Encounters:  07/13/22 214 lb 6.4 oz (97.3 kg)  06/30/22 218 lb 3.2 oz (99 kg)  05/18/22 218 lb 12.8 oz (99.2 kg)     Objective:    Vital Signs:  BP 113/71   Pulse 79   Ht '5\' 10"'$  (1.778 m)   Wt 214 lb 6.4 oz (97.3 kg)   SpO2 95%   BMI 30.76 kg/m  GEN: Well nourished, well developed in no acute distress HEENT: Normal NECK: No JVD; No carotid bruits LYMPHATICS: No lymphadenopathy CARDIAC:RRR, no murmurs, rubs, gallops RESPIRATORY:  Clear to auscultation without rales, wheezing or rhonchi  ABDOMEN: Soft, non-tender, non-distended MUSCULOSKELETAL:  No edema; No deformity  SKIN: Warm and dry NEUROLOGIC:  Alert and oriented x  3 PSYCHIATRIC:  Normal affect  ASSESSMENT & PLAN:    1.  OSA - The patient is tolerating PAP therapy well without any problems. The patient has been using and benefiting from PAP use and will continue to benefit from therapy.  -I will get a download from his device>he will bring his chip in for a download   2.  Hypertension -BP controlled on exam today -Continue prescription drug management with carvedilol 25 mg twice daily, Entresto 97-23 mg twice daily and spironolactone 25 mg 1/2 tablet daily with as needed refills -I  have personally reviewed and interpreted outside labs performed by patient's PCP which showed serum creatinine 0.92 and potassium 4 on 02/25/2022  Medication Adjustments/Labs and Tests Ordered: Current medicines are reviewed at length with the patient today.  Concerns regarding medicines are outlined above.  Tests Ordered: No orders of the defined types were placed in this encounter.   Medication Changes: No orders of the defined types were placed in this encounter.    Disposition:  Follow up in 1 year(s)  Signed, Fransico Him, MD  07/13/2022 3:02 PM    [facility]

## 2022-07-13 NOTE — Patient Instructions (Signed)
Medication Instructions:  Your physician recommends that you continue on your current medications as directed. Please refer to the Current Medication list given to you today.  *If you need a refill on your cardiac medications before your next appointment, please call your pharmacy*   Lab Work: None.  If you have labs (blood work) drawn today and your tests are completely normal, you will receive your results only by: MyChart Message (if you have MyChart) OR A paper copy in the mail If you have any lab test that is abnormal or we need to change your treatment, we will call you to review the results.   Testing/Procedures: None.   Follow-Up:  Your next appointment:   1 year(s)  Provider:   Dr. Traci Turner, MD     

## 2022-07-14 ENCOUNTER — Encounter: Payer: Self-pay | Admitting: Gastroenterology

## 2022-07-14 ENCOUNTER — Ambulatory Visit (AMBULATORY_SURGERY_CENTER): Payer: 59 | Admitting: Gastroenterology

## 2022-07-14 VITALS — BP 111/69 | HR 75 | Temp 97.5°F | Resp 15 | Ht 70.0 in | Wt 218.2 lb

## 2022-07-14 DIAGNOSIS — Z09 Encounter for follow-up examination after completed treatment for conditions other than malignant neoplasm: Secondary | ICD-10-CM

## 2022-07-14 DIAGNOSIS — Z86010 Personal history of colonic polyps: Secondary | ICD-10-CM | POA: Diagnosis not present

## 2022-07-14 DIAGNOSIS — E785 Hyperlipidemia, unspecified: Secondary | ICD-10-CM | POA: Diagnosis not present

## 2022-07-14 DIAGNOSIS — G4730 Sleep apnea, unspecified: Secondary | ICD-10-CM | POA: Diagnosis not present

## 2022-07-14 MED ORDER — SODIUM CHLORIDE 0.9 % IV SOLN: 500.0000 mL | Freq: Once | INTRAVENOUS | Status: DC

## 2022-07-14 NOTE — Progress Notes (Signed)
GASTROENTEROLOGY PROCEDURE H&P NOTE   Primary Care Physician: Biagio Borg, MD  HPI: Matthew Guerrero is a 67 y.o. male who presents for Colonoscopy for surveillance in setting of previous TAs.  Past Medical History:  Diagnosis Date   ALLERGIC RHINITIS    Allergy    B12 deficiency 04/06/2020   Cervical radiculitis    CHF (congestive heart failure) (HCC)    Chronic systolic heart failure ([place]) 02/28/2015   GERD (gastroesophageal reflux disease)    GOUT    Hyperglycemia 08/28/2014   HYPERLIPIDEMIA    HYPERTENSION    Lateral epicondylitis of right elbow    Nonischemic cardiomyopathy ([place]) 09/28/2014   Obesity (BMI 30-39.9) 06/06/2015   S/P cardiac cath wjith normal coronary arteries  09/28/2014   Sleep apnea    wears CPAP   Thrombocytopenia ([place]) 04/06/2020   Past Surgical History:  Procedure Laterality Date   COLONOSCOPY     COLONOSCOPY WITH PROPOFOL N/A 06/17/2017   Procedure: COLONOSCOPY WITH PROPOFOL;  Surgeon: Milus Banister, MD;  [facility];  Service: Endoscopy;  Laterality: N/A;   EP IMPLANTABLE DEVICE N/A 03/21/2015   Procedure: SubQ ICD Implant;  Surgeon: Deboraha Sprang, MD;  Location: [facility] CV LAB;  Service: Cardiovascular;  Laterality: N/A;   LEFT AND RIGHT HEART CATHETERIZATION WITH CORONARY ANGIOGRAM N/A 09/27/2014   Procedure: LEFT AND RIGHT HEART CATHETERIZATION WITH CORONARY ANGIOGRAM;  Surgeon: Belva Crome, MD;  Location: [facility] CATH LAB;  Service: Cardiovascular;  Laterality: N/A;   SUBQ ICD  03/21/2015   SUBQ ICD IMPLANT N/A 02/10/2021   Procedure: SUBQ ICD IMPLANT;  Surgeon: Deboraha Sprang, MD;  Location: [facility] CV LAB;  Service: Cardiovascular;  Laterality: N/A;   Current Outpatient Medications  Medication Sig Dispense Refill   ALLERGY RELIEF 180 MG tablet TAKE 1 TABLET(180 MG) BY MOUTH DAILY 30 tablet 2   allopurinol (ZYLOPRIM) 300 MG tablet Take 1 tablet (300 mg total) by mouth daily. 90 tablet 3   aspirin EC 81 MG tablet Take  81 mg by mouth daily. Swallow whole.     carvedilol (COREG) 25 MG tablet Take 1 tablet (25 mg total) by mouth 2 (two) times daily with a meal. Annual appt w/labs due in Oct must see provider for future refills 180 tablet 3   dapagliflozin propanediol (FARXIGA) 10 MG TABS tablet Take 1 tablet (10 mg total) by mouth daily before breakfast. Please call [phone] to schedule a November appointment with Dr. [name] for future refills. Thank you. 100 tablet 3   ENTRESTO 97-103 MG TAKE 1 TABLET BY MOUTH TWICE  DAILY 200 tablet 2   furosemide (LASIX) 40 MG tablet Take 1 tablet (40 mg total) by mouth daily. 90 tablet 3   indomethacin (INDOCIN) 50 MG capsule Take 1 capsule (50 mg total) by mouth 3 (three) times daily as needed. 300 capsule 3   pantoprazole (PROTONIX) 40 MG tablet Take 1 tablet (40 mg total) by mouth daily. Annual appt w/labs due in Oct must see provider for future refills 90 tablet 3   pravastatin (PRAVACHOL) 40 MG tablet Take 1 tablet (40 mg total) by mouth every evening. Annual appt w/labs due in Oct must see provider for future refills 90 tablet 3   spironolactone (ALDACTONE) 25 MG tablet Take 0.5 tablets (12.5 mg total) by mouth daily. 45 tablet 3   vitamin B-12 (CYANOCOBALAMIN) 1000 MCG tablet Take 1 tablet (1,000 mcg total) by mouth daily. 90 tablet 1   Current  Facility-Administered Medications  Medication Dose Route Frequency Provider Last Rate Last Admin   0.9 %  sodium chloride infusion  500 mL Intravenous Once Mansouraty, Telford Nab., MD        Current Outpatient Medications:    ALLERGY RELIEF 180 MG tablet, TAKE 1 TABLET(180 MG) BY MOUTH DAILY, Disp: 30 tablet, Rfl: 2   allopurinol (ZYLOPRIM) 300 MG tablet, Take 1 tablet (300 mg total) by mouth daily., Disp: 90 tablet, Rfl: 3   aspirin EC 81 MG tablet, Take 81 mg by mouth daily. Swallow whole., Disp: , Rfl:    carvedilol (COREG) 25 MG tablet, Take 1 tablet (25 mg total) by mouth 2 (two) times daily with a meal. Annual  appt w/labs due in Oct must see provider for future refills, Disp: 180 tablet, Rfl: 3   dapagliflozin propanediol (FARXIGA) 10 MG TABS tablet, Take 1 tablet (10 mg total) by mouth daily before breakfast. Please call [phone] to schedule a November appointment with Dr. [name] for future refills. Thank you., Disp: 100 tablet, Rfl: 3   ENTRESTO 97-103 MG, TAKE 1 TABLET BY MOUTH TWICE  DAILY, Disp: 200 tablet, Rfl: 2   furosemide (LASIX) 40 MG tablet, Take 1 tablet (40 mg total) by mouth daily., Disp: 90 tablet, Rfl: 3   indomethacin (INDOCIN) 50 MG capsule, Take 1 capsule (50 mg total) by mouth 3 (three) times daily as needed., Disp: 300 capsule, Rfl: 3   pantoprazole (PROTONIX) 40 MG tablet, Take 1 tablet (40 mg total) by mouth daily. Annual appt w/labs due in Oct must see provider for future refills, Disp: 90 tablet, Rfl: 3   pravastatin (PRAVACHOL) 40 MG tablet, Take 1 tablet (40 mg total) by mouth every evening. Annual appt w/labs due in Oct must see provider for future refills, Disp: 90 tablet, Rfl: 3   spironolactone (ALDACTONE) 25 MG tablet, Take 0.5 tablets (12.5 mg total) by mouth daily., Disp: 45 tablet, Rfl: 3   vitamin B-12 (CYANOCOBALAMIN) 1000 MCG tablet, Take 1 tablet (1,000 mcg total) by mouth daily., Disp: 90 tablet, Rfl: 1  Current Facility-Administered Medications:    0.9 %  sodium chloride infusion, 500 mL, Intravenous, Once, Mansouraty, Telford Nab., MD No Known Allergies Family History  Problem Relation Age of Onset   Lung cancer Mother    Diabetes Father    Heart disease Father    Heart attack Father 10   Hypertension Sister    Aneurysm Sister 82       brain aneurysm   Healthy Brother    Healthy Brother    Healthy Brother    Colon cancer Neg Hx    Esophageal cancer Neg Hx    Rectal cancer Neg Hx    Stomach cancer Neg Hx    Social History   Socioeconomic History   Marital status: Single    Spouse name: Not on file   Number of children: Not on file    Years of education: Not on file   Highest education level: Not on file  Occupational History   Occupation: retired  Tobacco Use   Smoking status: Never   Smokeless tobacco: Never  Vaping Use   Vaping Use: Never used  Substance and Sexual Activity   Alcohol use: No    Alcohol/week: 0.0 standard drinks of alcohol   Drug use: No   Sexual activity: Not Currently  Other Topics Concern   Not on file  Social History Narrative   Not on file   Social Determinants of  Health   Financial Resource Strain: Low Risk  (10/08/2021)   Overall Financial Resource Strain (CARDIA)    Difficulty of Paying Living Expenses: Not hard at all  Food Insecurity: No Food Insecurity (06/10/2022)   Hunger Vital Sign    Worried About Running Out of Food in the Last Year: Never true    Ran Out of Food in the Last Year: Never true  Transportation Needs: No Transportation Needs (06/10/2022)   PRAPARE - Hydrologist (Medical): No    Lack of Transportation (Non-Medical): No  Physical Activity: Sufficiently Active (10/08/2021)   Exercise Vital Sign    Days of Exercise per Week: 5 days    Minutes of Exercise per Session: 30 min  Stress: No Stress Concern Present (10/08/2021)   [place]    Feeling of Stress : Not at all  Social Connections: Moderately Integrated (10/08/2021)   Social Connection and Isolation Panel [NHANES]    Frequency of Communication with Friends and Family: More than three times a week    Frequency of Social Gatherings with Friends and Family: Once a week    Attends Religious Services: More than 4 times per year    Active Member of [employer] or Organizations: Yes    Attends Archivist Meetings: More than 4 times per year    Marital Status: Never married  Intimate Partner Violence: Not At Risk (10/08/2021)   Humiliation, Afraid, Rape, and Kick questionnaire    Fear of Current or Ex-Partner: No     Emotionally Abused: No    Physically Abused: No    Sexually Abused: No    Physical Exam: Today's Vitals   07/14/22 0727 07/14/22 0728  BP: 134/73   Pulse: 67   Temp: (!) 97.5 F (36.4 C) (!) 97.5 F (36.4 C)  TempSrc: Temporal   SpO2: 94%   Weight: 218 lb 3.2 oz (99 kg)   Height: '5\' 10"'$  (1.778 m)    Body mass index is 31.31 kg/m. GEN: NAD EYE: Sclerae anicteric ENT: MMM CV: Non-tachycardic GI: Soft, NT/ND NEURO:  Alert & Oriented x 3  Lab Results: No results for input(s): "WBC", "HGB", "HCT", "PLT" in the last 72 hours. BMET No results for input(s): "NA", "K", "CL", "CO2", "GLUCOSE", "BUN", "CREATININE", "CALCIUM" in the last 72 hours. LFT No results for input(s): "PROT", "ALBUMIN", "AST", "ALT", "ALKPHOS", "BILITOT", "BILIDIR", "IBILI" in the last 72 hours. PT/INR No results for input(s): "LABPROT", "INR" in the last 72 hours.   Impression / Plan: This is a 67 y.o.male who presents for Colonoscopy for surveillance in setting of previous TAs.  The risks and benefits of endoscopic evaluation/treatment were discussed with the patient and/or family; these include but are not limited to the risk of perforation, infection, bleeding, missed lesions, lack of diagnosis, severe illness requiring hospitalization, as well as anesthesia and sedation related illnesses.  The patient's history has been reviewed, patient examined, no change in status, and deemed stable for procedure.  The patient and/or family is agreeable to proceed.    Justice Britain, MD [facility] Advanced Endoscopy Office # [phone]

## 2022-07-14 NOTE — Patient Instructions (Signed)
Handouts given on hemorrhoids and diverticulosis.  High fiber diet recommended.  May take Fibercon 1-2 tablets by mouth daily.   YOU HAD AN ENDOSCOPIC PROCEDURE TODAY AT [facility]:   Refer to the procedure report that was given to you for any specific questions about what was found during the examination.  If the procedure report does not answer your questions, please call your gastroenterologist to clarify.  If you requested that your care partner not be given the details of your procedure findings, then the procedure report has been included in a sealed envelope for you to review at your convenience later.  YOU SHOULD EXPECT: Some feelings of bloating in the abdomen. Passage of more gas than usual.  Walking can help get rid of the air that was put into your GI tract during the procedure and reduce the bloating. If you had a lower endoscopy (such as a colonoscopy or flexible sigmoidoscopy) you may notice spotting of blood in your stool or on the toilet paper. If you underwent a bowel prep for your procedure, you may not have a normal bowel movement for a few days.  Please Note:  You might notice some irritation and congestion in your nose or some drainage.  This is from the oxygen used during your procedure.  There is no need for concern and it should clear up in a day or so.  SYMPTOMS TO REPORT IMMEDIATELY:  Following lower endoscopy (colonoscopy or flexible sigmoidoscopy):  Excessive amounts of blood in the stool  Significant tenderness or worsening of abdominal pains  Swelling of the abdomen that is new, acute  Fever of 100F or higher   For urgent or emergent issues, a gastroenterologist can be reached at any hour by calling [phone]. Do not use MyChart messaging for urgent concerns.    DIET:  We do recommend a small meal at first, but then you may proceed to your regular diet.  Drink plenty of fluids but you should avoid alcoholic beverages for 24  hours.  ACTIVITY:  You should plan to take it easy for the rest of today and you should NOT DRIVE or use heavy machinery until tomorrow (because of the sedation medicines used during the test).    FOLLOW UP: Our staff will call the number listed on your records the next business day following your procedure.  We will call around 7:15- 8:00 am to check on you and address any questions or concerns that you may have regarding the information given to you following your procedure. If we do not reach you, we will leave a message.     If any biopsies were taken you will be contacted by phone or by letter within the next 1-3 weeks.  Please call us at [phone] if you have not heard about the biopsies in 3 weeks.    SIGNATURES/CONFIDENTIALITY: You and/or your care partner have signed paperwork which will be entered into your electronic medical record.  These signatures attest to the fact that that the information above on your After Visit Summary has been reviewed and is understood.  Full responsibility of the confidentiality of this discharge information lies with you and/or your care-partner.

## 2022-07-14 NOTE — Op Note (Signed)
[place] Patient Name: Matthew Guerrero Procedure Date: 07/14/2022 8:37 AM MRN: [id number] Endoscopist: Justice Britain , MD, [id number] Age: 67 Referring MD:  Date of Birth: Jan 24, 1956 Gender: Male Account #: [id number] Procedure:                Colonoscopy Indications:              Surveillance: Personal history of adenomatous                            polyps on last colonoscopy > 5 years ago Medicines:                Monitored Anesthesia Care Procedure:                Pre-Anesthesia Assessment:                           - Prior to the procedure, a History and Physical                            was performed, and patient medications and                            allergies were reviewed. The patient's tolerance of                            previous anesthesia was also reviewed. The risks                            and benefits of the procedure and the sedation                            options and risks were discussed with the patient.                            All questions were answered, and informed consent                            was obtained. Prior Anticoagulants: The patient has                            taken no anticoagulant or antiplatelet agents. ASA                            Grade Assessment: III - A patient with severe                            systemic disease. After reviewing the risks and                            benefits, the patient was deemed in satisfactory                            condition to undergo the procedure.  After obtaining informed consent, the colonoscope                            was passed under direct vision. Throughout the                            procedure, the patient's blood pressure, pulse, and                            oxygen saturations were monitored continuously. The                            Olympus CF-HQ190L [id number]) Colonoscope was                            introduced  through the anus and advanced to the 3                            cm into the ileum. The colonoscopy was performed                            without difficulty. The patient tolerated the                            procedure. Scope In: 8:44:06 AM Scope Out: 8:56:26 AM Scope Withdrawal Time: 0 hours 9 minutes 10 seconds  Total Procedure Duration: 0 hours 12 minutes 20 seconds  Findings:                 The digital rectal exam findings include                            hemorrhoids. Pertinent negatives include no                            palpable rectal lesions.                           The terminal ileum and ileocecal valve appeared                            normal.                           Multiple small-mouthed diverticula were found in                            the recto-sigmoid colon and sigmoid colon.                           Normal mucosa was found in the entire colon.                           Non-bleeding non-thrombosed internal hemorrhoids  were found during retroflexion, during perianal                            exam and during digital exam. The hemorrhoids were                            Grade II (internal hemorrhoids that prolapse but                            reduce spontaneously). Complications:            No immediate complications. Estimated Blood Loss:     Estimated blood loss: none. Impression:               - Hemorrhoids found on digital rectal exam.                           - The examined portion of the ileum was normal.                           - Diverticulosis in the recto-sigmoid colon and in                            the sigmoid colon.                           - Normal mucosa in the entire examined colon.                           - Non-bleeding non-thrombosed internal hemorrhoids. Recommendation:           - The patient will be observed post-procedure,                            until all discharge criteria are met.                            - Discharge patient to home.                           - Patient has a contact number available for                            emergencies. The signs and symptoms of potential                            delayed complications were discussed with the                            patient. Return to normal activities tomorrow.                            Written discharge instructions were provided to the                            patient.                           -  High fiber diet.                           - Use FiberCon 1-2 tablets PO daily.                           - Continue present medications.                           - Repeat colonoscopy in 7 years for surveillance                            due to personal history of adenomatous polyps.                           - The findings and recommendations were discussed                            with the patient.                           - The findings and recommendations were discussed                            with the designated responsible adult. Justice Britain, MD 07/14/2022 9:01:06 AM

## 2022-07-14 NOTE — Progress Notes (Signed)
Pt's states no medical or surgical changes since previsit or office visit. 

## 2022-07-15 ENCOUNTER — Telehealth: Payer: Self-pay

## 2022-07-15 NOTE — Telephone Encounter (Signed)
  Follow up Call-     07/14/2022    7:28 AM  Call back number  Post procedure Call Back phone  # [phone]  Permission to leave phone message Yes     Patient questions:  Do you have a fever, pain , or abdominal swelling? No. Pain Score  0 *  Have you tolerated food without any problems? Yes.    Have you been able to return to your normal activities? Yes.    Do you have any questions about your discharge instructions: Diet   No. Medications  No. Follow up visit  No.  Do you have questions or concerns about your Care? No.  Actions: * If pain score is 4 or above: No action needed, pain <4.

## 2022-07-17 ENCOUNTER — Telehealth: Payer: Self-pay | Admitting: *Deleted

## 2022-07-17 NOTE — Telephone Encounter (Signed)
Patient brought his machine back for the download. Matthew Guerrero, Matthew Guerrero 06/17/2022 - 07/16/2022 DOB: 07-08-1955 Age: 67 years [facility] [phone]) [facility] Compliance Report Usage 06/17/2022 - 07/16/2022 Usage days 22/30 days (73%) >= 4 hours 20 days (67%) < 4 hours 2 days (7%) Usage hours 154 hours 0 minutes Average usage (total days) 5 hours 8 minutes Average usage (days used) 7 hours 0 minutes Median usage (days used) 7 hours 40 minutes Total used hours (value since last reset - 07/16/2022) 7,092 hours AirSense 10 AutoSet Serial number [id number] Mode AutoSet Min Pressure 4 cmH2O Max Pressure 18 cmH2O EPR Fulltime EPR level 3 Response Standard Therapy Pressure - cmH2O Median: 6.0 95th percentile: 9.6 Maximum: 11.0 Leaks - L/min Median: 0.0 95th percentile: 9.3 Maximum: 25.3 Events per hour AI: 1.3 HI: 0.2 AHI: 1.5 Apnea Index Central: 0.2 Obstructive: 0.9 Unknown: 0.1 RERA Index 0.1 Cheyne-Stokes respiration (average duration per night) 0 minutes (0%) Usage - hours Printed on 07/17/2022 - ResMed AirView version 4.43.0-10.0 Page 1 of 1

## 2022-07-24 NOTE — Telephone Encounter (Signed)
Patient notified via my chart message.

## 2022-08-11 ENCOUNTER — Ambulatory Visit (INDEPENDENT_AMBULATORY_CARE_PROVIDER_SITE_OTHER): Payer: 59

## 2022-08-11 DIAGNOSIS — I428 Other cardiomyopathies: Secondary | ICD-10-CM

## 2022-08-11 LAB — CUP PACEART REMOTE DEVICE CHECK
Battery Remaining Percentage: 85 %
Date Time Interrogation Session: 20240220070200
Implantable Lead Connection Status: 753985
Implantable Lead Implant Date: 20160929
Implantable Lead Location: 753862
Implantable Lead Model: 3401
Implantable Pulse Generator Implant Date: 20220822
Pulse Gen Serial Number: 166274

## 2022-09-10 NOTE — Progress Notes (Signed)
Remote ICD transmission.   

## 2022-10-01 ENCOUNTER — Ambulatory Visit: Payer: 59 | Admitting: Internal Medicine

## 2022-10-05 ENCOUNTER — Other Ambulatory Visit (INDEPENDENT_AMBULATORY_CARE_PROVIDER_SITE_OTHER): Payer: 59

## 2022-10-05 DIAGNOSIS — E559 Vitamin D deficiency, unspecified: Secondary | ICD-10-CM

## 2022-10-05 DIAGNOSIS — E538 Deficiency of other specified B group vitamins: Secondary | ICD-10-CM | POA: Diagnosis not present

## 2022-10-05 DIAGNOSIS — R7303 Prediabetes: Secondary | ICD-10-CM | POA: Diagnosis not present

## 2022-10-05 LAB — HEPATIC FUNCTION PANEL
ALT: 28 U/L (ref 0–53)
AST: 24 U/L (ref 0–37)
Albumin: 4.3 g/dL (ref 3.5–5.2)
Alkaline Phosphatase: 55 U/L (ref 39–117)
Bilirubin, Direct: 0.1 mg/dL (ref 0.0–0.3)
Total Bilirubin: 0.6 mg/dL (ref 0.2–1.2)
Total Protein: 6.8 g/dL (ref 6.0–8.3)

## 2022-10-05 LAB — BASIC METABOLIC PANEL
BUN: 13 mg/dL (ref 6–23)
CO2: 28 mEq/L (ref 19–32)
Calcium: 8.9 mg/dL (ref 8.4–10.5)
Chloride: 102 mEq/L (ref 96–112)
Creatinine, Ser: 1.32 mg/dL (ref 0.40–1.50)
GFR: 55.58 mL/min — ABNORMAL LOW (ref >60.00)
Glucose, Bld: 143 mg/dL — ABNORMAL HIGH (ref 70–99)
Potassium: 4 mEq/L (ref 3.5–5.1)
Sodium: 138 mEq/L (ref 135–145)

## 2022-10-05 LAB — LIPID PANEL
Cholesterol: 106 mg/dL (ref 0–200)
HDL: 28.2 mg/dL — ABNORMAL LOW (ref >39.00)
LDL Cholesterol: 44 mg/dL (ref 0–99)
NonHDL: 77.77
Total CHOL/HDL Ratio: 4
Triglycerides: 167 mg/dL — ABNORMAL HIGH (ref 0.0–149.0)
VLDL: 33.4 mg/dL (ref 0.0–40.0)

## 2022-10-05 LAB — VITAMIN B12: Vitamin B-12: 1403 pg/mL — ABNORMAL HIGH (ref 211–911)

## 2022-10-05 LAB — HEMOGLOBIN A1C: Hgb A1c MFr Bld: 6.9 % — ABNORMAL HIGH (ref 4.6–6.5)

## 2022-10-05 LAB — VITAMIN D 25 HYDROXY (VIT D DEFICIENCY, FRACTURES): VITD: 32.63 ng/mL (ref 30.00–100.00)

## 2022-10-09 ENCOUNTER — Encounter: Payer: Self-pay | Admitting: Nurse Practitioner

## 2022-10-09 ENCOUNTER — Ambulatory Visit: Payer: 59 | Attending: Internal Medicine | Admitting: Nurse Practitioner

## 2022-10-09 VITALS — BP 128/76 | HR 59 | Ht 70.0 in | Wt 219.6 lb

## 2022-10-09 DIAGNOSIS — I10 Essential (primary) hypertension: Secondary | ICD-10-CM | POA: Diagnosis not present

## 2022-10-09 DIAGNOSIS — I5022 Chronic systolic (congestive) heart failure: Secondary | ICD-10-CM | POA: Diagnosis not present

## 2022-10-09 DIAGNOSIS — Z95810 Presence of automatic (implantable) cardiac defibrillator: Secondary | ICD-10-CM

## 2022-10-09 DIAGNOSIS — E782 Mixed hyperlipidemia: Secondary | ICD-10-CM | POA: Diagnosis not present

## 2022-10-09 DIAGNOSIS — G4733 Obstructive sleep apnea (adult) (pediatric): Secondary | ICD-10-CM | POA: Diagnosis not present

## 2022-10-09 DIAGNOSIS — I428 Other cardiomyopathies: Secondary | ICD-10-CM

## 2022-10-09 NOTE — Patient Instructions (Signed)
Medication Instructions:  Your physician recommends that you continue on your current medications as directed. Please refer to the Current Medication list given to you today.   *If you need a refill on your cardiac medications before your next appointment, please call your pharmacy*   Lab Work: NONE ordered at this time of appointment   If you have labs (blood work) drawn today and your tests are completely normal, you will receive your results only by: MyChart Message (if you have MyChart) OR A paper copy in the mail If you have any lab test that is abnormal or we need to change your treatment, we will call you to review the results.   Testing/Procedures: NONE ordered at this time of appointment     Follow-Up: At [facility], you and your health needs are our priority.  As part of our continuing mission to provide you with exceptional heart care, we have created designated Provider Care Teams.  These Care Teams include your primary Cardiologist (physician) and Advanced Practice Providers (APPs -  Physician Assistants and Nurse Practitioners) who all work together to provide you with the care you need, when you need it.  We recommend signing up for the patient portal called "MyChart".  Sign up information is provided on this After Visit Summary.  MyChart is used to connect with patients for Virtual Visits (Telemedicine).  Patients are able to view lab/test results, encounter notes, upcoming appointments, etc.  Non-urgent messages can be sent to your provider as well.   To learn more about what you can do with MyChart, go to [email or web address].    Your next appointment:   1 year(s)  Provider:   Kenneth C Hilty, MD     Other Instructions  

## 2022-10-09 NOTE — Progress Notes (Unsigned)
Office Visit    Patient Name: Matthew Guerrero Date of Encounter: 10/09/2022  Primary Care Provider:  Corwin Levins, MD Primary Cardiologist:  Chrystie Nose, MD  Chief Complaint    67 year old male with a history of chronic systolic heart failure, NICM s/p ICD, hypertension, hyperlipidemia, OSA on CPAP, asthma, and gout who presents for follow-up related to heart failure and hypertension.  Past Medical History    Past Medical History:  Diagnosis Date   ALLERGIC RHINITIS    Allergy    B12 deficiency 04/06/2020   Cervical radiculitis    CHF (congestive heart failure)    Chronic systolic heart failure 02/28/2015   GERD (gastroesophageal reflux disease)    GOUT    Hyperglycemia 08/28/2014   HYPERLIPIDEMIA    HYPERTENSION    Lateral epicondylitis of right elbow    Nonischemic cardiomyopathy 09/28/2014   Obesity (BMI 30-39.9) 06/06/2015   S/P cardiac cath wjith normal coronary arteries  09/28/2014   Sleep apnea    wears CPAP   Thrombocytopenia 04/06/2020   Past Surgical History:  Procedure Laterality Date   COLONOSCOPY     COLONOSCOPY WITH PROPOFOL N/A 06/17/2017   Procedure: COLONOSCOPY WITH PROPOFOL;  Surgeon: Rachael Fee, MD;  Location: [facility];  Service: Endoscopy;  Laterality: N/A;   EP IMPLANTABLE DEVICE N/A 03/21/2015   Procedure: SubQ ICD Implant;  Surgeon: Duke Salvia, MD;  Location: [facility] INVASIVE CV LAB;  Service: Cardiovascular;  Laterality: N/A;   LEFT AND RIGHT HEART CATHETERIZATION WITH CORONARY ANGIOGRAM N/A 09/27/2014   Procedure: LEFT AND RIGHT HEART CATHETERIZATION WITH CORONARY ANGIOGRAM;  Surgeon: Lyn Records, MD;  Location: [facility] CATH LAB;  Service: Cardiovascular;  Laterality: N/A;   SUBQ ICD  03/21/2015   SUBQ ICD IMPLANT N/A 02/10/2021   Procedure: SUBQ ICD IMPLANT;  Surgeon: Duke Salvia, MD;  Location: [facility] INVASIVE CV LAB;  Service: Cardiovascular;  Laterality: N/A;    Allergies  No Known Allergies   Labs/Other Studies Reviewed     The following studies were reviewed today: Echo 09/27/14: -  Mild LVH. EF 15% to 20%. Diffuse hypokinesis. There is akinesis of the entireanteroseptal myocardium. Grade 1 diastolic dysfunction. There is muscular trabeculation at apex of left ventricle (no obvious thrombus identified).  Definity contrast used. - Mitral valve: There was mild regurgitation. - Left atrium: The atrium was moderately dilated. - Right ventricle: The cavity size was moderately dilated. Wall thickness was normal. - Right atrium: The atrium was mildly dilated.   LHC 09/27/14: The left main coronary artery is normal. The left anterior descending artery is normal. The left circumflex artery is normal. The right coronary artery is dominant and normal. EF: 15%.   Echo 09/2021: IMPRESSIONS    1. GLS average appears to overestimate function, likely inaccurate. Left  ventricular ejection fraction, by estimation, is 35 to 40%. The left  ventricle has moderately decreased function. The left ventricle  demonstrates global hypokinesis. There is mild  left ventricular hypertrophy. Left ventricular diastolic parameters are  consistent with Grade I diastolic dysfunction (impaired relaxation).   2. Right ventricular systolic function is mildly reduced. The right  ventricular size is normal. Tricuspid regurgitation signal is inadequate  for assessing PA pressure.   3. Hypermoble septum, no significant PFO visualized.   4. The mitral valve is degenerative. Trivial mitral valve regurgitation.   5. Aortic valve regurgitation is not visualized. Aortic valve  sclerosis/calcification is present, without any evidence of aortic  stenosis.   6.  The inferior vena cava is normal in size with greater than 50%  respiratory variability, suggesting right atrial pressure of 3 mmHg.   Comparison(s): No significant change from prior study. 03/05/20 EF 35-40%.   Recent Labs: 03/20/2022: Hemoglobin 16.1; Platelets 148.0; TSH 2.22 10/05/2022: ALT  28; BUN 13; Creatinine, Ser 1.32; Potassium 4.0; Sodium 138  Recent Lipid Panel    Component Value Date/Time   CHOL 106 10/05/2022 0756   CHOL 134 10/21/2016 1438   TRIG 167.0 (H) 10/05/2022 0756   HDL 28.20 (L) 10/05/2022 0756   HDL 35 (L) 10/21/2016 1438   CHOLHDL 4 10/05/2022 0756   VLDL 33.4 10/05/2022 0756   LDLCALC 44 10/05/2022 0756   LDLCALC 68 10/21/2016 1438   LDLDIRECT 73.0 04/01/2021 0747    History of Present Illness    67 year old male with the above past medical history including chronic systolic heart failure, NICM s/p ICD, hypertension, hyperlipidemia, OSA on CPAP, asthma, and gout.  He was hospitalized in April 2016 in the setting of progressive shortness of breath, exertional chest pain.  Echo showed EF 15 to 20%.  Cardiac catheterization revealed normal coronary arteries, EF 15%.  Repeat limited echo on 02/09/2015 showed EF 10 to 15% despite optimization of GDMT.  He subsequently underwent ICD placement.  He underwent generator change in 01/2021.  He was last seen in the office by general cardiology on 09/18/2021 and was stable from a cardiac standpoint. Most recent echocardiogram 03/25/2022 showed EF 45 to 50%, moderately decreased LV function, mild LVH, G1 DD, mildly reduced RV systolic function, no significant valvular abnormalities, this was unchanged compared to prior echo in 2021.  He saw EP on 05/18/2022 and was doing well.  He saw Dr. Mayford Knife for management of OSA on 07/13/2022 he was doing well.   He presents today for follow-up.  Since his last visit he has done well from a cardiac standpoint.  He denies any symptoms concerning for angina, Denies dyspnea, edema, PND, orthopnea, weight gain.  Overall, he reports feeling very well.  Chronic systolic heart failure/NICM: Prior EF 15 to 20%, s/p ICD (follows with EP).  Most recent echo showed EF 45 to 50%, moderately decreased LV function, mild LVH, G1 DD, mildly reduced RV systolic function, no significant valvular  abnormalities, this was unchanged compared to prior echo in 2021. Hypertension: BP well controlled. Continue current antihypertensive regimen.  Hyperlipidemia: LDL was 44 in 09/2022. OSA: Adherent to CPAP. Disposition: Follow-up in 1 year.  Home Medications    Current Outpatient Medications  Medication Sig Dispense Refill   ALLERGY RELIEF 180 MG tablet TAKE 1 TABLET(180 MG) BY MOUTH DAILY 30 tablet 2   allopurinol (ZYLOPRIM) 300 MG tablet Take 1 tablet (300 mg total) by mouth daily. 90 tablet 3   aspirin EC 81 MG tablet Take 81 mg by mouth daily. Swallow whole.     carvedilol (COREG) 25 MG tablet Take 1 tablet (25 mg total) by mouth 2 (two) times daily with a meal. Annual appt w/labs due in Oct must see provider for future refills 180 tablet 3   dapagliflozin propanediol (FARXIGA) 10 MG TABS tablet Take 1 tablet (10 mg total) by mouth daily before breakfast. Please call [phone] to schedule a November appointment with Dr. Sherryl Manges for future refills. Thank you. 100 tablet 3   ENTRESTO 97-103 MG TAKE 1 TABLET BY MOUTH TWICE  DAILY 200 tablet 2   furosemide (LASIX) 40 MG tablet Take 1 tablet (40 mg total) by mouth daily.  90 tablet 3   pantoprazole (PROTONIX) 40 MG tablet Take 1 tablet (40 mg total) by mouth daily. Annual appt w/labs due in Oct must see provider for future refills 90 tablet 3   pravastatin (PRAVACHOL) 40 MG tablet Take 1 tablet (40 mg total) by mouth every evening. Annual appt w/labs due in Oct must see provider for future refills 90 tablet 3   spironolactone (ALDACTONE) 25 MG tablet Take 0.5 tablets (12.5 mg total) by mouth daily. 45 tablet 3   vitamin B-12 (CYANOCOBALAMIN) 1000 MCG tablet Take 1 tablet (1,000 mcg total) by mouth daily. 90 tablet 1   No current facility-administered medications for this visit.     Review of Systems    He denies chest pain, palpitations, dyspnea, pnd, orthopnea, n, v, dizziness, syncope, edema, weight gain, or early satiety. All other  systems reviewed and are otherwise negative except as noted above.   Physical Exam    VS:  BP 128/76   Pulse (!) 59   Ht  (1.778 m)   Wt 219 lb 9.6 oz (99.6 kg)   SpO2 95%   BMI 31.51 kg/m   GEN: Well nourished, well developed, in no acute distress. HEENT: normal. Neck: Supple, no JVD, carotid bruits, or masses. Cardiac: RRR, no murmurs, rubs, or gallops. No clubbing, cyanosis, edema.  Radials/DP/PT 2+ and equal bilaterally.  Respiratory:  Respirations regular and unlabored, clear to auscultation bilaterally. GI: Soft, nontender, nondistended, BS + x 4. MS: no deformity or atrophy. Skin: warm and dry, no rash. Neuro:  Strength and sensation are intact. Psych: Normal affect.  Accessory Clinical Findings    ECG personally reviewed by me today -sinus bradycardia, 59 bpm- no acute changes.   Lab Results  Component Value Date   WBC 8.1 03/20/2022   HGB 16.1 03/20/2022   HCT 50.2 03/20/2022   MCV 89.9 03/20/2022   PLT 148.0 (L) 03/20/2022   Lab Results  Component Value Date   CREATININE 1.32 10/05/2022   BUN 13 10/05/2022   NA 138 10/05/2022   K 4.0 10/05/2022   CL 102 10/05/2022   CO2 28 10/05/2022   Lab Results  Component Value Date   ALT 28 10/05/2022   AST 24 10/05/2022   ALKPHOS 55 10/05/2022   BILITOT 0.6 10/05/2022   Lab Results  Component Value Date   CHOL 106 10/05/2022   HDL 28.20 (L) 10/05/2022   LDLCALC 44 10/05/2022   LDLDIRECT 73.0 04/01/2021   TRIG 167.0 (H) 10/05/2022   CHOLHDL 4 10/05/2022    Lab Results  Component Value Date   HGBA1C 6.9 (H) 10/05/2022    Assessment & Plan    1.  ***      Joylene Grapes, NP 10/09/2022, 8:57 AM

## 2022-10-10 ENCOUNTER — Other Ambulatory Visit: Payer: Self-pay | Admitting: Internal Medicine

## 2022-10-11 ENCOUNTER — Encounter: Payer: Self-pay | Admitting: Nurse Practitioner

## 2022-10-12 ENCOUNTER — Ambulatory Visit (INDEPENDENT_AMBULATORY_CARE_PROVIDER_SITE_OTHER): Payer: 59 | Admitting: Internal Medicine

## 2022-10-12 ENCOUNTER — Encounter: Payer: Self-pay | Admitting: Internal Medicine

## 2022-10-12 VITALS — BP 124/82 | HR 62 | Temp 98.3°F | Ht 70.0 in | Wt 216.0 lb

## 2022-10-12 DIAGNOSIS — E559 Vitamin D deficiency, unspecified: Secondary | ICD-10-CM | POA: Diagnosis not present

## 2022-10-12 DIAGNOSIS — E538 Deficiency of other specified B group vitamins: Secondary | ICD-10-CM

## 2022-10-12 DIAGNOSIS — Z0001 Encounter for general adult medical examination with abnormal findings: Secondary | ICD-10-CM | POA: Diagnosis not present

## 2022-10-12 DIAGNOSIS — Z125 Encounter for screening for malignant neoplasm of prostate: Secondary | ICD-10-CM

## 2022-10-12 DIAGNOSIS — E7800 Pure hypercholesterolemia, unspecified: Secondary | ICD-10-CM | POA: Diagnosis not present

## 2022-10-12 DIAGNOSIS — R7303 Prediabetes: Secondary | ICD-10-CM

## 2022-10-12 DIAGNOSIS — I10 Essential (primary) hypertension: Secondary | ICD-10-CM | POA: Diagnosis not present

## 2022-10-12 DIAGNOSIS — I5022 Chronic systolic (congestive) heart failure: Secondary | ICD-10-CM | POA: Diagnosis not present

## 2022-10-12 NOTE — Progress Notes (Signed)
Patient ID: Matthew Guerrero, male   DOB: June 10, 1956, 67 y.o.   MRN: [id number]         Chief Complaint:: wellness exam and preDM, low b12, low vit d, htn, chf       HPI:  Matthew Guerrero is a 67 y.o. male here for wellness exam; declines covid booster, o/w up to date                        Also Pt denies chest pain, increased sob or doe, wheezing, orthopnea, PND, increased LE swelling, palpitations, dizziness or syncope.   Pt denies polydipsia, polyuria, or new focal neuro s/s.    Pt denies fever, wt loss, night sweats, loss of appetite, or other constitutional symptoms   Hard to lose wt with diet and exercise, and A1c has been creeping up.    Wt Readings from Last 3 Encounters:  10/12/22 216 lb (98 kg)  10/09/22 219 lb 9.6 oz (99.6 kg)  07/14/22 218 lb 3.2 oz (99 kg)   BP Readings from Last 3 Encounters:  10/12/22 124/82  10/09/22 128/76  07/14/22 111/69   Immunization History  Administered Date(s) Administered   Fluad Quad(high Dose 65+) 04/04/2020, 03/28/2021, 03/30/2022   Influenza Split 04/28/2012   Influenza Whole 03/22/2009, 04/18/2010   Influenza,inj,Quad PF,6+ Mos 02/25/2014, 02/28/2015, 03/29/2018, 03/09/2019   Influenza-Unspecified 02/10/2017, 03/29/2018, 03/09/2019   Moderna Sars-Covid-2 Vaccination 09/08/2019, 10/09/2019, 04/23/2020   Pneumococcal Conjugate-13 04/04/2020   Pneumococcal Polysaccharide-23 02/28/2015, 03/30/2022   Td 07/01/2009   Tdap 03/30/2022   Zoster Recombinat (Shingrix) 09/29/2016, 12/02/2016   Health Maintenance Due  Topic Date Due   Medicare Annual Wellness (AWV)  10/09/2022      Past Medical History:  Diagnosis Date   ALLERGIC RHINITIS    Allergy    B12 deficiency 04/06/2020   Cervical radiculitis    CHF (congestive heart failure)    Chronic systolic heart failure 02/28/2015   GERD (gastroesophageal reflux disease)    GOUT    Hyperglycemia 08/28/2014   HYPERLIPIDEMIA    HYPERTENSION    Lateral epicondylitis of right elbow     Nonischemic cardiomyopathy 09/28/2014   Obesity (BMI 30-39.9) 06/06/2015   S/P cardiac cath wjith normal coronary arteries  09/28/2014   Sleep apnea    wears CPAP   Thrombocytopenia 04/06/2020   Past Surgical History:  Procedure Laterality Date   COLONOSCOPY     COLONOSCOPY WITH PROPOFOL N/A 06/17/2017   Procedure: COLONOSCOPY WITH PROPOFOL;  Surgeon: Rachael Fee, MD;  Location: [facility] ENDOSCOPY;  Service: Endoscopy;  Laterality: N/A;   EP IMPLANTABLE DEVICE N/A 03/21/2015   Procedure: SubQ ICD Implant;  Surgeon: Duke Salvia, MD;  Location: [facility] INVASIVE CV LAB;  Service: Cardiovascular;  Laterality: N/A;   LEFT AND RIGHT HEART CATHETERIZATION WITH CORONARY ANGIOGRAM N/A 09/27/2014   Procedure: LEFT AND RIGHT HEART CATHETERIZATION WITH CORONARY ANGIOGRAM;  Surgeon: Lyn Records, MD;  Location: [facility] CATH LAB;  Service: Cardiovascular;  Laterality: N/A;   SUBQ ICD  03/21/2015   SUBQ ICD IMPLANT N/A 02/10/2021   Procedure: SUBQ ICD IMPLANT;  Surgeon: Duke Salvia, MD;  Location: [facility] INVASIVE CV LAB;  Service: Cardiovascular;  Laterality: N/A;    reports that he has never smoked. He has never used smokeless tobacco. He reports that he does not drink alcohol and does not use drugs. family history includes Aneurysm (age of onset: 53) in his sister; Diabetes in his father; Healthy in his brother, brother,  and brother; Heart attack (age of onset: 83) in his father; Heart disease in his father; Hypertension in his sister; Lung cancer in his mother. No Known Allergies Current Outpatient Medications on File Prior to Visit  Medication Sig Dispense Refill   ALLERGY RELIEF 180 MG tablet TAKE 1 TABLET(180 MG) BY MOUTH DAILY 30 tablet 2   allopurinol (ZYLOPRIM) 300 MG tablet Take 1 tablet (300 mg total) by mouth daily. 90 tablet 3   aspirin EC 81 MG tablet Take 81 mg by mouth daily. Swallow whole.     carvedilol (COREG) 25 MG tablet Take 1 tablet (25 mg total) by mouth 2 (two) times daily with a meal.  Annual appt w/labs due in Oct must see provider for future refills 180 tablet 3   dapagliflozin propanediol (FARXIGA) 10 MG TABS tablet Take 1 tablet (10 mg total) by mouth daily before breakfast. Please call [phone] to schedule a November appointment with Dr. Sherryl Manges for future refills. Thank you. 100 tablet 3   furosemide (LASIX) 40 MG tablet Take 1 tablet (40 mg total) by mouth daily. 90 tablet 3   pantoprazole (PROTONIX) 40 MG tablet Take 1 tablet (40 mg total) by mouth daily. Annual appt w/labs due in Oct must see provider for future refills 90 tablet 3   pravastatin (PRAVACHOL) 40 MG tablet Take 1 tablet (40 mg total) by mouth every evening. Annual appt w/labs due in Oct must see provider for future refills 90 tablet 3   spironolactone (ALDACTONE) 25 MG tablet Take 0.5 tablets (12.5 mg total) by mouth daily. 45 tablet 3   vitamin B-12 (CYANOCOBALAMIN) 1000 MCG tablet Take 1 tablet (1,000 mcg total) by mouth daily. 90 tablet 1   ENTRESTO 97-103 MG TAKE 1 TABLET BY MOUTH TWICE  DAILY 200 tablet 2   No current facility-administered medications on file prior to visit.        ROS:  All others reviewed and negative.  Objective        PE:  BP 124/82 (BP Location: Right Arm, Patient Position: Sitting, Cuff Size: Normal)   Pulse 62   Temp 98.3 F (36.8 C) (Oral)   Ht 5\' 10"  (1.778 m)   Wt 216 lb (98 kg)   SpO2 98%   BMI 30.99 kg/m                 Constitutional: Pt appears in NAD               HENT: Head: NCAT.                Right Ear: External ear normal.                 Left Ear: External ear normal.                Eyes: . Pupils are equal, round, and reactive to light. Conjunctivae and EOM are normal               Nose: without d/c or deformity               Neck: Neck supple. Gross normal ROM               Cardiovascular: Normal rate and regular rhythm.                 Pulmonary/Chest: Effort normal and breath sounds without rales or wheezing.  Abd:  Soft,  NT, ND, + BS, no organomegaly               Neurological: Pt is alert. At baseline orientation, motor grossly intact               Skin: Skin is warm. No rashes, no other new lesions, LE edema - none               Psychiatric: Pt behavior is normal without agitation   Micro: none  Cardiac tracings I have personally interpreted today:  none  Pertinent Radiological findings (summarize): none   Lab Results  Component Value Date   WBC 8.1 03/20/2022   HGB 16.1 03/20/2022   HCT 50.2 03/20/2022   PLT 148.0 (L) 03/20/2022   GLUCOSE 143 (H) 10/05/2022   CHOL 106 10/05/2022   TRIG 167.0 (H) 10/05/2022   HDL 28.20 (L) 10/05/2022   LDLDIRECT 73.0 04/01/2021   LDLCALC 44 10/05/2022   ALT 28 10/05/2022   AST 24 10/05/2022   NA 138 10/05/2022   K 4.0 10/05/2022   CL 102 10/05/2022   CREATININE 1.32 10/05/2022   BUN 13 10/05/2022   CO2 28 10/05/2022   TSH 2.22 03/20/2022   PSA 0.66 03/20/2022   INR 1.2 (H) 03/15/2015   HGBA1C 6.9 (H) 10/05/2022   Assessment/Plan:  Matthew Guerrero is a 67 y.o. Black or African American [2] male with  has a past medical history of ALLERGIC RHINITIS, Allergy, B12 deficiency (04/06/2020), Cervical radiculitis, CHF (congestive heart failure), Chronic systolic heart failure (02/28/2015), GERD (gastroesophageal reflux disease), GOUT, Hyperglycemia (08/28/2014), HYPERLIPIDEMIA, HYPERTENSION, Lateral epicondylitis of right elbow, Nonischemic cardiomyopathy (09/28/2014), Obesity (BMI 30-39.9) (06/06/2015), S/P cardiac cath wjith normal coronary arteries  (09/28/2014), Sleep apnea, and Thrombocytopenia (04/06/2020).  Encounter for well adult exam with abnormal findings Age and sex appropriate education and counseling updated with regular exercise and diet Referrals for preventative services - none needed Immunizations addressed - declines covid booster Smoking counseling  - none needed Evidence for depression or other mood disorder - none significant Most recent  labs reviewed. I have personally reviewed and have noted: 1) the patient's medical and social history 2) The patient's current medications and supplements 3) The patient's height, weight, and BMI have been recorded in the chart   Prediabetes Lab Results  Component Value Date   HGBA1C 6.9 (H) 10/05/2022   Ucnontrolled worsening recent,, pt to continue current medical treatment farxiga 10 mg qd, declines add glp1 for now   Vitamin D deficiency Last vitamin D Lab Results  Component Value Date   VD25OH 32.63 10/05/2022   Low, to start oral replacement   Chronic systolic heart failure (HCC) Stable volume, cont same tx, f/u cardiology as planned  B12 deficiency Lab Results  Component Value Date   VITAMINB12 1,403 (H) 10/05/2022   Overcontrolled, pt to decrease oral replacement - b12 1000 mcg to twice weekly  Essential hypertension BP Readings from Last 3 Encounters:  10/12/22 124/82  10/09/22 128/76  07/14/22 111/69   Stable, pt to continue medical treatment coreg 25 bid   Hyperlipidemia Lab Results  Component Value Date   LDLCALC 44 10/05/2022   Stable, pt to continue current statin pravachol 40 mg qd  Followup: Return in about 6 months (around 04/13/2023).  Oliver Barre, MD 10/14/2022 4:28 PM [facility]

## 2022-10-12 NOTE — Patient Instructions (Signed)
Ok to cut back on the B12 to 1 per week  Please take OTC Vitamin D3 at 2000 units per day, indefinitely  Please continue all other medications as before, and refills have been done if requested.  Please have the pharmacy call with any other refills you may need.  Please continue your efforts at being more active, low cholesterol diet, and weight control.  You are otherwise up to date with prevention measures today.  Please keep your appointments with your specialists as you may have planned  Please make an Appointment to return in 6 months, or sooner if needed, also with Lab Appointment for testing done 3-5 days before at the FIRST FLOOR Lab (so this is for TWO appointments - please see the scheduling desk as you leave)

## 2022-10-14 ENCOUNTER — Encounter: Payer: Self-pay | Admitting: Internal Medicine

## 2022-10-14 DIAGNOSIS — G4733 Obstructive sleep apnea (adult) (pediatric): Secondary | ICD-10-CM | POA: Diagnosis not present

## 2022-10-14 NOTE — Assessment & Plan Note (Signed)
Lab Results  Component Value Date   LDLCALC 44 10/05/2022   Stable, pt to continue current statin pravachol 40 mg qd

## 2022-10-14 NOTE — Assessment & Plan Note (Signed)
BP Readings from Last 3 Encounters:  10/12/22 124/82  10/09/22 128/76  07/14/22 111/69   Stable, pt to continue medical treatment coreg 25 bid

## 2022-10-14 NOTE — Assessment & Plan Note (Addendum)
Lab Results  Component Value Date   VITAMINB12 1,403 (H) 10/05/2022   Overcontrolled, pt to decrease oral replacement - b12 1000 mcg to twice weekly

## 2022-10-14 NOTE — Assessment & Plan Note (Signed)

## 2022-10-14 NOTE — Assessment & Plan Note (Signed)
Lab Results  Component Value Date   HGBA1C 6.9 (H) 10/05/2022   Ucnontrolled worsening recent,, pt to continue current medical treatment farxiga 10 mg qd, declines add glp1 for now

## 2022-10-14 NOTE — Assessment & Plan Note (Signed)
Stable volume, cont same tx, f/u cardiology as planned °

## 2022-10-14 NOTE — Assessment & Plan Note (Signed)
Last vitamin D Lab Results  Component Value Date   VD25OH 32.63 10/05/2022   Low, to start oral replacement

## 2022-10-15 ENCOUNTER — Ambulatory Visit (INDEPENDENT_AMBULATORY_CARE_PROVIDER_SITE_OTHER): Payer: 59

## 2022-10-15 VITALS — Ht 70.0 in | Wt 216.0 lb

## 2022-10-15 DIAGNOSIS — Z0000 Encounter for general adult medical examination without abnormal findings: Secondary | ICD-10-CM | POA: Diagnosis not present

## 2022-10-15 NOTE — Patient Instructions (Addendum)
Matthew Guerrero , Thank you for taking time to come for your Medicare Wellness Visit. I appreciate your ongoing commitment to your health goals. Please review the following plan we discussed and let me know if I can assist you in the future.   These are the goals we discussed:  Goals      My goal for 2024 is to maintain my health.        This is a list of the screening recommended for you and due dates:  Health Maintenance  Topic Date Due   Flu Shot  01/21/2023   Medicare Annual Wellness Visit  10/15/2023   Colon Cancer Screening  07/14/2029   DTaP/Tdap/Td vaccine (3 - Td or Tdap) 03/30/2032   Pneumonia Vaccine  Completed   Hepatitis C Screening: USPSTF Recommendation to screen - Ages 51-79 yo.  Completed   Zoster (Shingles) Vaccine  Completed   HPV Vaccine  Aged Out   COVID-19 Vaccine  Discontinued    Advanced directives: Yes; documents on file.  Conditions/risks identified: Yes  Next appointment: Follow up in one year for your annual wellness visit.   Preventive Care 68 Years and Older, Male  Preventive care refers to lifestyle choices and visits with your health care provider that can promote health and wellness. What does preventive care include? A yearly physical exam. This is also called an annual well check. Dental exams once or twice a year. Routine eye exams. Ask your health care provider how often you should have your eyes checked. Personal lifestyle choices, including: Daily care of your teeth and gums. Regular physical activity. Eating a healthy diet. Avoiding tobacco and drug use. Limiting alcohol use. Practicing safe sex. Taking low doses of aspirin every day. Taking vitamin and mineral supplements as recommended by your health care provider. What happens during an annual well check? The services and screenings done by your health care provider during your annual well check will depend on your age, overall health, lifestyle risk factors, and family history of  disease. Counseling  Your health care provider may ask you questions about your: Alcohol use. Tobacco use. Drug use. Emotional well-being. Home and relationship well-being. Sexual activity. Eating habits. History of falls. Memory and ability to understand (cognition). Work and work Astronomer. Screening  You may have the following tests or measurements: Height, weight, and BMI. Blood pressure. Lipid and cholesterol levels. These may be checked every 5 years, or more frequently if you are over 58 years old. Skin check. Lung cancer screening. You may have this screening every year starting at age 43 if you have a 30-pack-year history of smoking and currently smoke or have quit within the past 15 years. Fecal occult blood test (FOBT) of the stool. You may have this test every year starting at age 85. Flexible sigmoidoscopy or colonoscopy. You may have a sigmoidoscopy every 5 years or a colonoscopy every 10 years starting at age 5. Prostate cancer screening. Recommendations will vary depending on your family history and other risks. Hepatitis C blood test. Hepatitis B blood test. Sexually transmitted disease (STD) testing. Diabetes screening. This is done by checking your blood sugar (glucose) after you have not eaten for a while (fasting). You may have this done every 1-3 years. Abdominal aortic aneurysm (AAA) screening. You may need this if you are a current or former smoker. Osteoporosis. You may be screened starting at age 79 if you are at high risk. Talk with your health care provider about your test results, treatment options,  and if necessary, the need for more tests. Vaccines  Your health care provider may recommend certain vaccines, such as: Influenza vaccine. This is recommended every year. Tetanus, diphtheria, and acellular pertussis (Tdap, Td) vaccine. You may need a Td booster every 10 years. Zoster vaccine. You may need this after age 7. Pneumococcal 13-valent  conjugate (PCV13) vaccine. One dose is recommended after age 6. Pneumococcal polysaccharide (PPSV23) vaccine. One dose is recommended after age 62. Talk to your health care provider about which screenings and vaccines you need and how often you need them. This information is not intended to replace advice given to you by your health care provider. Make sure you discuss any questions you have with your health care provider. Document Released: 07/05/2015 Document Revised: 02/26/2016 Document Reviewed: 04/09/2015 Elsevier Interactive Patient Education  2017 [place] Prevention in the Home Falls can cause injuries. They can happen to people of all ages. There are many things you can do to make your home safe and to help prevent falls. What can I do on the outside of my home? Regularly fix the edges of walkways and driveways and fix any cracks. Remove anything that might make you trip as you walk through a door, such as a raised step or threshold. Trim any bushes or trees on the path to your home. Use bright outdoor lighting. Clear any walking paths of anything that might make someone trip, such as rocks or tools. Regularly check to see if handrails are loose or broken. Make sure that both sides of any steps have handrails. Any raised decks and porches should have guardrails on the edges. Have any leaves, snow, or ice cleared regularly. Use sand or salt on walking paths during winter. Clean up any spills in your garage right away. This includes oil or grease spills. What can I do in the bathroom? Use night lights. Install grab bars by the toilet and in the tub and shower. Do not use towel bars as grab bars. Use non-skid mats or decals in the tub or shower. If you need to sit down in the shower, use a plastic, non-slip stool. Keep the floor dry. Clean up any water that spills on the floor as soon as it happens. Remove soap buildup in the tub or shower regularly. Attach bath mats  securely with double-sided non-slip rug tape. Do not have throw rugs and other things on the floor that can make you trip. What can I do in the bedroom? Use night lights. Make sure that you have a light by your bed that is easy to reach. Do not use any sheets or blankets that are too big for your bed. They should not hang down onto the floor. Have a firm chair that has side arms. You can use this for support while you get dressed. Do not have throw rugs and other things on the floor that can make you trip. What can I do in the kitchen? Clean up any spills right away. Avoid walking on wet floors. Keep items that you use a lot in easy-to-reach places. If you need to reach something above you, use a strong step stool that has a grab bar. Keep electrical cords out of the way. Do not use floor polish or wax that makes floors slippery. If you must use wax, use non-skid floor wax. Do not have throw rugs and other things on the floor that can make you trip. What can I do with my stairs? Do not leave  any items on the stairs. Make sure that there are handrails on both sides of the stairs and use them. Fix handrails that are broken or loose. Make sure that handrails are as long as the stairways. Check any carpeting to make sure that it is firmly attached to the stairs. Fix any carpet that is loose or worn. Avoid having throw rugs at the top or bottom of the stairs. If you do have throw rugs, attach them to the floor with carpet tape. Make sure that you have a light switch at the top of the stairs and the bottom of the stairs. If you do not have them, ask someone to add them for you. What else can I do to help prevent falls? Wear shoes that: Do not have high heels. Have rubber bottoms. Are comfortable and fit you well. Are closed at the toe. Do not wear sandals. If you use a stepladder: Make sure that it is fully opened. Do not climb a closed stepladder. Make sure that both sides of the stepladder  are locked into place. Ask someone to hold it for you, if possible. Clearly mark and make sure that you can see: Any grab bars or handrails. First and last steps. Where the edge of each step is. Use tools that help you move around (mobility aids) if they are needed. These include: Canes. Walkers. Scooters. Crutches. Turn on the lights when you go into a dark area. Replace any light bulbs as soon as they burn out. Set up your furniture so you have a clear path. Avoid moving your furniture around. If any of your floors are uneven, fix them. If there are any pets around you, be aware of where they are. Review your medicines with your doctor. Some medicines can make you feel dizzy. This can increase your chance of falling. Ask your doctor what other things that you can do to help prevent falls. This information is not intended to replace advice given to you by your health care provider. Make sure you discuss any questions you have with your health care provider. Document Released: 04/04/2009 Document Revised: 11/14/2015 Document Reviewed: 07/13/2014 Elsevier Interactive Patient Education  2017 [employer].

## 2022-10-15 NOTE — Progress Notes (Signed)
I connected with  Matthew Guerrero on 10/15/22 by a audio enabled telemedicine application and verified that I am speaking with the correct person using two identifiers.  Patient Location: Home  Provider Location: Office/Clinic  I discussed the limitations of evaluation and management by telemedicine. The patient expressed understanding and agreed to proceed.  Patient Medicare AWV questionnaire was completed by the patient on 10/13/2022; I have confirmed that all information answered by patient is correct and no changes since this date.    Subjective:   Matthew Guerrero is a 67 y.o. male who presents for Medicare Annual/Subsequent preventive examination.  Review of Systems     Cardiac Risk Factors include: advanced age (>62men, >49 women);dyslipidemia;family history of premature cardiovascular disease;hypertension;male gender;obesity (BMI >30kg/m2)     Objective:    Today's Vitals   10/15/22 0851  Weight: 216 lb (98 kg)  Height: 5\' 10"  (1.778 m)  PainSc: 0-No pain   Body mass index is 30.99 kg/m.     10/15/2022    8:54 AM 10/08/2021    3:12 PM 02/10/2021    7:44 AM 10/07/2020    4:00 PM 10/02/2019    8:31 AM 08/18/2018    2:21 PM 07/14/2018   11:25 AM  Advanced Directives  Does Patient Have a Medical Advance Directive? Yes Yes Yes Yes Yes No No  Type of Estate agent of [place];Living will Healthcare Power of [employer] of [place];Living will  Healthcare Power of Attorney    Does patient want to make changes to medical advance directive? No - Patient declined No - Patient declined  No - Patient declined No - Patient declined    Copy of Healthcare Power of Attorney in Chart? Yes - validated most recent copy scanned in chart (See row information) Yes - validated most recent copy scanned in chart (See row information)   Yes - validated most recent copy scanned in chart (See row information)    Would patient like information on creating a medical  advance directive?      Yes (ED - Information included in AVS) No - Patient declined    Current Medications (verified) Outpatient Encounter Medications as of 10/15/2022  Medication Sig   ALLERGY RELIEF 180 MG tablet TAKE 1 TABLET(180 MG) BY MOUTH DAILY   allopurinol (ZYLOPRIM) 300 MG tablet Take 1 tablet (300 mg total) by mouth daily.   aspirin EC 81 MG tablet Take 81 mg by mouth daily. Swallow whole.   carvedilol (COREG) 25 MG tablet Take 1 tablet (25 mg total) by mouth 2 (two) times daily with a meal. Annual appt w/labs due in Oct must see provider for future refills   dapagliflozin propanediol (FARXIGA) 10 MG TABS tablet Take 1 tablet (10 mg total) by mouth daily before breakfast. Please call [phone] to schedule a November appointment with Dr. Sherryl Manges for future refills. Thank you.   ENTRESTO 97-103 MG TAKE 1 TABLET BY MOUTH TWICE  DAILY   furosemide (LASIX) 40 MG tablet Take 1 tablet (40 mg total) by mouth daily.   pantoprazole (PROTONIX) 40 MG tablet Take 1 tablet (40 mg total) by mouth daily. Annual appt w/labs due in Oct must see provider for future refills   pravastatin (PRAVACHOL) 40 MG tablet Take 1 tablet (40 mg total) by mouth every evening. Annual appt w/labs due in Oct must see provider for future refills   spironolactone (ALDACTONE) 25 MG tablet Take 0.5 tablets (12.5 mg total) by mouth daily.   vitamin B-12 (CYANOCOBALAMIN)  1000 MCG tablet Take 1 tablet (1,000 mcg total) by mouth daily.   No facility-administered encounter medications on file as of 10/15/2022.    Allergies (verified) Patient has no known allergies.   History: Past Medical History:  Diagnosis Date   ALLERGIC RHINITIS    Allergy    B12 deficiency 04/06/2020   Cervical radiculitis    CHF (congestive heart failure)    Chronic systolic heart failure 02/28/2015   GERD (gastroesophageal reflux disease)    GOUT    Hyperglycemia 08/28/2014   HYPERLIPIDEMIA    HYPERTENSION    Lateral epicondylitis  of right elbow    Nonischemic cardiomyopathy 09/28/2014   Obesity (BMI 30-39.9) 06/06/2015   S/P cardiac cath wjith normal coronary arteries  09/28/2014   Sleep apnea    wears CPAP   Thrombocytopenia 04/06/2020   Past Surgical History:  Procedure Laterality Date   COLONOSCOPY     COLONOSCOPY WITH PROPOFOL N/A 06/17/2017   Procedure: COLONOSCOPY WITH PROPOFOL;  Surgeon: Rachael Fee, MD;  Location: [facility] ENDOSCOPY;  Service: Endoscopy;  Laterality: N/A;   EP IMPLANTABLE DEVICE N/A 03/21/2015   Procedure: SubQ ICD Implant;  Surgeon: Duke Salvia, MD;  Location: [facility] INVASIVE CV LAB;  Service: Cardiovascular;  Laterality: N/A;   LEFT AND RIGHT HEART CATHETERIZATION WITH CORONARY ANGIOGRAM N/A 09/27/2014   Procedure: LEFT AND RIGHT HEART CATHETERIZATION WITH CORONARY ANGIOGRAM;  Surgeon: Lyn Records, MD;  Location: [facility] CATH LAB;  Service: Cardiovascular;  Laterality: N/A;   SUBQ ICD  03/21/2015   SUBQ ICD IMPLANT N/A 02/10/2021   Procedure: SUBQ ICD IMPLANT;  Surgeon: Duke Salvia, MD;  Location: [facility] INVASIVE CV LAB;  Service: Cardiovascular;  Laterality: N/A;   Family History  Problem Relation Age of Onset   Lung cancer Mother    Diabetes Father    Heart disease Father    Heart attack Father 52   Hypertension Sister    Aneurysm Sister 70       brain aneurysm   Healthy Brother    Healthy Brother    Healthy Brother    Colon cancer Neg Hx    Esophageal cancer Neg Hx    Rectal cancer Neg Hx    Stomach cancer Neg Hx    Social History   Socioeconomic History   Marital status: Single    Spouse name: Not on file   Number of children: Not on file   Years of education: Not on file   Highest education level: Not on file  Occupational History   Occupation: retired  Tobacco Use   Smoking status: Never   Smokeless tobacco: Never  Vaping Use   Vaping Use: Never used  Substance and Sexual Activity   Alcohol use: No    Alcohol/week: 0.0 standard drinks of alcohol   Drug use: No    Sexual activity: Not Currently  Other Topics Concern   Not on file  Social History Narrative   Not on file   Social Determinants of Health   Financial Resource Strain: Low Risk  (10/15/2022)   Overall Financial Resource Strain (CARDIA)    Difficulty of Paying Living Expenses: Not hard at all  Food Insecurity: No Food Insecurity (10/15/2022)   Hunger Vital Sign    Worried About Running Out of Food in the Last Year: Never true    Ran Out of Food in the Last Year: Never true  Transportation Needs: No Transportation Needs (10/15/2022)   PRAPARE - Transportation    Lack  of Transportation (Medical): No    Lack of Transportation (Non-Medical): No  Physical Activity: Insufficiently Active (10/15/2022)   Exercise Vital Sign    Days of Exercise per Week: 2 days    Minutes of Exercise per Session: 20 min  Stress: No Stress Concern Present (10/15/2022)   [employer] of Occupational Health - Occupational Stress Questionnaire    Feeling of Stress : Not at all  Social Connections: Unknown (10/15/2022)   Social Connection and Isolation Panel [NHANES]    Frequency of Communication with Friends and Family: Twice a week    Frequency of Social Gatherings with Friends and Family: Twice a week    Attends Religious Services: Not on Marketing executive or Organizations: Yes    Attends Engineer, structural: More than 4 times per year    Marital Status: Living with partner    Tobacco Counseling Counseling given: Not Answered   Clinical Intake:  Pre-visit preparation completed: Yes  Pain : No/denies pain Pain Score: 0-No pain     BMI - recorded: 30.99 Nutritional Status: BMI > 30  Obese Nutritional Risks: None Diabetes: No (Prediabetes) CBG done?: No Did pt. bring in CBG monitor from home?: No  How often do you need to have someone help you when you read instructions, pamphlets, or other written materials from your doctor or pharmacy?: 1 - Never What is the last  grade level you completed in school?: GED  Diabetic? No  Interpreter Needed?: No  Information entered by :: Susie Cassette, LPN.   Activities of Daily Living    10/15/2022    8:55 AM 10/13/2022    9:39 AM  In your present state of health, do you have any difficulty performing the following activities:  Hearing? 0 0  Vision? 0 0  Difficulty concentrating or making decisions? 0 0  Walking or climbing stairs? 0 0  Dressing or bathing? 0 0  Doing errands, shopping? 0 0  Preparing Food and eating ? N N  Using the Toilet? N N  In the past six months, have you accidently leaked urine? N N  Do you have problems with loss of bowel control? N N  Managing your Medications? N N  Managing your Finances? N N  Housekeeping or managing your Housekeeping? N N    Patient Care Team: Corwin Levins, MD as PCP - General (Internal Medicine) Rennis Golden Lisette Abu, MD as PCP - Cardiology (Cardiology) Quintella Reichert, MD as PCP - Sleep Medicine (Cardiology) Duke Salvia, MD as PCP - Electrophysiology (Cardiology) Conley Rolls, [facility] [place], [place] as Referring Physician (Optometry)  Indicate any recent Medical Services you may have received from other than [facility] providers in the past year (date may be approximate).     Assessment:   This is a routine wellness examination for Aguilar.  Hearing/Vision screen Hearing Screening - Comments:: Denies hearing difficulties   Vision Screening - Comments:: Wears rx glasses - up to date with routine eye exams with [facility], [facility].   Dietary issues and exercise activities discussed: Current Exercise Habits: Home exercise routine, Type of exercise: walking, Time (Minutes): 20, Frequency (Times/Week): 2, Weekly Exercise (Minutes/Week): 40, Intensity: Moderate, Exercise limited by: cardiac condition(s)   Goals Addressed             This Visit's Progress    My goal for 2024 is to maintain my health.        Depression Screen    10/15/2022  8:54 AM 10/12/2022    8:17  AM 06/10/2022   11:41 AM 03/30/2022    2:47 PM 11/03/2021    1:06 PM 10/08/2021    2:42 PM 03/28/2021    4:45 PM  PHQ 2/9 Scores  PHQ - 2 Score 0 0 0 0 0 0 0  PHQ- 9 Score    0 0      Fall Risk    10/15/2022    8:54 AM 10/13/2022    9:39 AM 10/12/2022    7:20 PM 10/12/2022    8:17 AM 06/10/2022   11:41 AM  Fall Risk   Falls in the past year? 0 0 0 0 0  Number falls in past yr: 0   0 0  Injury with Fall? 0 0 0 0 0  Comment     N/A- no falls reported; does not use assistive devices  Risk for fall due to : No Fall Risks   No Fall Risks No Fall Risks  Follow up Falls prevention discussed   Falls evaluation completed Falls prevention discussed    FALL RISK PREVENTION PERTAINING TO THE HOME:  Any stairs in or around the home? No  If so, are there any without handrails? No  Home free of loose throw rugs in walkways, pet beds, electrical cords, etc? Yes  Adequate lighting in your home to reduce risk of falls? Yes   ASSISTIVE DEVICES UTILIZED TO PREVENT FALLS:  Life alert? No  Use of a cane, walker or w/c? No  Grab bars in the bathroom? Yes  Shower chair or bench in shower? No  Elevated toilet seat or a handicapped toilet? Yes   TIMED UP AND GO:  Was the test performed? No . Telephonic Visit   Cognitive Function:        10/15/2022    8:55 AM 10/08/2021    2:45 PM 10/02/2019    8:36 AM  6CIT Screen  What Year? 0 points 0 points 0 points  What month? 0 points 0 points 0 points  What time? 0 points 0 points 0 points  Count back from 20 0 points 0 points 0 points  Months in reverse 0 points 0 points 0 points  Repeat phrase 0 points 0 points 0 points  Total Score 0 points 0 points 0 points    Immunizations Immunization History  Administered Date(s) Administered   Fluad Quad(high Dose 65+) 04/04/2020, 03/28/2021, 03/30/2022   Influenza Split 04/28/2012   Influenza Whole 03/22/2009, 04/18/2010   Influenza,inj,Quad PF,6+ Mos 02/25/2014, 02/28/2015, 03/29/2018, 03/09/2019    Influenza-Unspecified 02/10/2017, 03/29/2018, 03/09/2019   Moderna Sars-Covid-2 Vaccination 09/08/2019, 10/09/2019, 04/23/2020   Pneumococcal Conjugate-13 04/04/2020   Pneumococcal Polysaccharide-23 02/28/2015, 03/30/2022   Td 07/01/2009   Tdap 03/30/2022   Zoster Recombinat (Shingrix) 09/29/2016, 12/02/2016    TDAP status: Up to date  Flu Vaccine status: Up to date  Pneumococcal vaccine status: Up to date  Covid-19 vaccine status: Completed vaccines  Qualifies for Shingles Vaccine? Yes   Zostavax completed No   Shingrix Completed?: Yes  Screening Tests Health Maintenance  Topic Date Due   INFLUENZA VACCINE  01/21/2023   Medicare Annual Wellness (AWV)  10/15/2023   COLONOSCOPY (Pts 45-48yrs Insurance coverage will need to be confirmed)  07/14/2029   DTaP/Tdap/Td (3 - Td or Tdap) 03/30/2032   Pneumonia Vaccine 1+ Years old  Completed   Hepatitis C Screening  Completed   Zoster Vaccines- Shingrix  Completed   HPV VACCINES  Aged Out   COVID-19  Vaccine  Discontinued    Health Maintenance  There are no preventive care reminders to display for this patient.   Colorectal cancer screening: Type of screening: Colonoscopy. Completed 07/14/2022. Repeat every 7 years  Lung Cancer Screening: (Low Dose CT Chest recommended if Age 68-80 years, 30 pack-year currently smoking OR have quit w/in 15years.) does not qualify.   Lung Cancer Screening Referral: no  Additional Screening:  Hepatitis C Screening: does qualify; Completed 05/31/2019  Vision Screening: Recommended annual ophthalmology exams for early detection of glaucoma and other disorders of the eye. Is the patient up to date with their annual eye exam?  Yes  Who is the provider or what is the name of the office in which the patient attends annual eye exams? [facility], [place]. If pt is not established with a provider, would they like to be referred to a provider to establish care? No .   Dental Screening: Recommended annual  dental exams for proper oral hygiene  Community Resource Referral / Chronic Care Management: CRR required this visit?  No   CCM required this visit?  No      Plan:     I have personally reviewed and noted the following in the patient's chart:   Medical and social history Use of alcohol, tobacco or illicit drugs  Current medications and supplements including opioid prescriptions. Patient is not currently taking opioid prescriptions. Functional ability and status Nutritional status Physical activity Advanced directives List of other physicians Hospitalizations, surgeries, and ER visits in previous 12 months Vitals Screenings to include cognitive, depression, and falls Referrals and appointments  In addition, I have reviewed and discussed with patient certain preventive protocols, quality metrics, and best practice recommendations. A written personalized care plan for preventive services as well as general preventive health recommendations were provided to patient.     Mickeal Needy, LPN   1/61/0960   Nurse Notes:  Normal cognitive status assessed by direct observation via phone by this Nurse Health Advisor. No abnormalities found.

## 2022-11-10 ENCOUNTER — Ambulatory Visit (INDEPENDENT_AMBULATORY_CARE_PROVIDER_SITE_OTHER): Payer: 59

## 2022-11-10 DIAGNOSIS — I5022 Chronic systolic (congestive) heart failure: Secondary | ICD-10-CM

## 2022-11-10 DIAGNOSIS — I428 Other cardiomyopathies: Secondary | ICD-10-CM

## 2022-11-10 LAB — CUP PACEART REMOTE DEVICE CHECK
Battery Remaining Percentage: 82 %
Date Time Interrogation Session: 20240521074900
Implantable Lead Connection Status: 753985
Implantable Lead Implant Date: 20160929
Implantable Lead Location: 753862
Implantable Lead Model: 3401
Implantable Pulse Generator Implant Date: 20220822
Pulse Gen Serial Number: 166274

## 2022-12-03 NOTE — Progress Notes (Signed)
Remote ICD transmission.   

## 2022-12-29 ENCOUNTER — Telehealth: Payer: Self-pay | Admitting: Physician Assistant

## 2022-12-29 NOTE — Telephone Encounter (Signed)
°  1. Has your device fired? no ° °2. Is you device beeping? no ° °3. Are you experiencing draining or swelling at device site?no ° °4. Are you calling to see if we received your device transmission?yes ° °5. Have you passed out? no ° ° ° °Please route to Device Clinic Pool °

## 2022-12-29 NOTE — Telephone Encounter (Signed)
Remote transmission was received. Patient called and advised. Appreciative of call.

## 2022-12-29 NOTE — Telephone Encounter (Signed)
Called patient to advise we did not receive a transmission. States he has attempted several times to send without success. States he called [employer] who advised patient he does not need to worry about sending, retry next week.   Advised patient I will forward to a remote specialist to assist. Patient appreciative of call.

## 2023-01-15 ENCOUNTER — Other Ambulatory Visit: Payer: Self-pay | Admitting: Internal Medicine

## 2023-01-15 DIAGNOSIS — E785 Hyperlipidemia, unspecified: Secondary | ICD-10-CM

## 2023-01-18 ENCOUNTER — Telehealth: Payer: Self-pay | Admitting: Internal Medicine

## 2023-01-18 ENCOUNTER — Encounter: Payer: Self-pay | Admitting: Internal Medicine

## 2023-01-18 DIAGNOSIS — E785 Hyperlipidemia, unspecified: Secondary | ICD-10-CM

## 2023-01-18 MED ORDER — PRAVASTATIN SODIUM 40 MG PO TABS: 40.0000 mg | ORAL_TABLET | Freq: Every evening | ORAL | 2 refills | Status: DC

## 2023-01-18 MED ORDER — PANTOPRAZOLE SODIUM 40 MG PO TBEC: 40.0000 mg | DELAYED_RELEASE_TABLET | Freq: Every day | ORAL | 2 refills | Status: DC

## 2023-01-18 NOTE — Telephone Encounter (Signed)
Patient's pharmacy recently requested refills for pravastatin (PRAVACHOL) 40 MG tablet and pantoprazole (PROTONIX) 40 MG tablet . He said they were requested early because it is a mail order pharmacy and would take time to get to him. He would like to know if the refills can be sent to the pharmacy to be ready for when he is due to get a refill. Best callback is [phone].

## 2023-01-18 NOTE — Telephone Encounter (Signed)
Called pt inform rx's has been sent to [facility]...Matthew Guerrero

## 2023-02-01 ENCOUNTER — Other Ambulatory Visit: Payer: Self-pay | Admitting: Internal Medicine

## 2023-02-04 DIAGNOSIS — G4733 Obstructive sleep apnea (adult) (pediatric): Secondary | ICD-10-CM | POA: Diagnosis not present

## 2023-02-09 ENCOUNTER — Ambulatory Visit: Payer: 59

## 2023-02-09 ENCOUNTER — Telehealth: Payer: Self-pay | Admitting: Internal Medicine

## 2023-02-09 DIAGNOSIS — I5022 Chronic systolic (congestive) heart failure: Secondary | ICD-10-CM

## 2023-02-09 DIAGNOSIS — I428 Other cardiomyopathies: Secondary | ICD-10-CM

## 2023-02-09 NOTE — Telephone Encounter (Signed)
Pt states it showed on his end that his remote transmission went through but he stated he received a text saying it did not. He would like clarification on this matter. Please advise.

## 2023-02-09 NOTE — Telephone Encounter (Signed)
Spoke with patient informed him that we got data from his monitor today at 0756 AM.

## 2023-02-09 NOTE — Telephone Encounter (Signed)
Returned call to pt for some clarification. Pt has a defibulator and he states that the transmission went through today according to his "blue" light  but he state he got a text message that our office did not received the "test". Will forward this to the device clinic.

## 2023-02-10 LAB — CUP PACEART REMOTE DEVICE CHECK
Battery Remaining Percentage: 79 %
Date Time Interrogation Session: 20240821153700
HighPow Impedance: 80 Ohm
Implantable Lead Connection Status: 753985
Implantable Lead Implant Date: 20160929
Implantable Lead Location: 753862
Implantable Lead Model: 3401
Implantable Pulse Generator Implant Date: 20220822
Pulse Gen Serial Number: 166274

## 2023-02-19 NOTE — Progress Notes (Signed)
Remote ICD transmission.   

## 2023-02-23 ENCOUNTER — Other Ambulatory Visit: Payer: Self-pay | Admitting: Internal Medicine

## 2023-02-23 ENCOUNTER — Telehealth: Payer: Self-pay | Admitting: Internal Medicine

## 2023-02-23 DIAGNOSIS — I428 Other cardiomyopathies: Secondary | ICD-10-CM

## 2023-02-23 NOTE — Telephone Encounter (Signed)
Pt called in to see if his transmission went through this morning. I informed him it was received 02/10/23 and he isn't due for another one until November, but he would like to be sure.

## 2023-02-24 ENCOUNTER — Other Ambulatory Visit: Payer: Self-pay

## 2023-02-25 NOTE — Telephone Encounter (Signed)
I let the patient know we did get his transmission. The patient was very thankful.

## 2023-03-26 ENCOUNTER — Other Ambulatory Visit: Payer: Self-pay | Admitting: Internal Medicine

## 2023-03-29 ENCOUNTER — Other Ambulatory Visit: Payer: Self-pay

## 2023-04-08 ENCOUNTER — Other Ambulatory Visit: Payer: 59

## 2023-04-08 DIAGNOSIS — Z125 Encounter for screening for malignant neoplasm of prostate: Secondary | ICD-10-CM

## 2023-04-08 DIAGNOSIS — E7800 Pure hypercholesterolemia, unspecified: Secondary | ICD-10-CM

## 2023-04-08 DIAGNOSIS — E559 Vitamin D deficiency, unspecified: Secondary | ICD-10-CM

## 2023-04-08 DIAGNOSIS — R7303 Prediabetes: Secondary | ICD-10-CM

## 2023-04-08 DIAGNOSIS — E538 Deficiency of other specified B group vitamins: Secondary | ICD-10-CM

## 2023-04-08 LAB — LIPID PANEL
Cholesterol: 126 mg/dL (ref 0–200)
HDL: 32.8 mg/dL — ABNORMAL LOW (ref >39.00)
LDL Cholesterol: 55 mg/dL (ref 0–99)
NonHDL: 92.94
Total CHOL/HDL Ratio: 4
Triglycerides: 189 mg/dL — ABNORMAL HIGH (ref 0.0–149.0)
VLDL: 37.8 mg/dL (ref 0.0–40.0)

## 2023-04-08 LAB — URINALYSIS, ROUTINE W REFLEX MICROSCOPIC
Bilirubin Urine: NEGATIVE
Hgb urine dipstick: NEGATIVE
Ketones, ur: NEGATIVE
Leukocytes,Ua: NEGATIVE
Nitrite: NEGATIVE
Specific Gravity, Urine: <=1.005 — AB (ref 1.000–1.030)
Total Protein, Urine: NEGATIVE
Urine Glucose: >=1000 — AB
Urobilinogen, UA: 0.2 (ref 0.0–1.0)
pH: 6.5 (ref 5.0–8.0)

## 2023-04-08 LAB — BASIC METABOLIC PANEL
BUN: 14 mg/dL (ref 6–23)
CO2: 28 meq/L (ref 19–32)
Calcium: 9.9 mg/dL (ref 8.4–10.5)
Chloride: 103 meq/L (ref 96–112)
Creatinine, Ser: 1.32 mg/dL (ref 0.40–1.50)
GFR: 55.39 mL/min — ABNORMAL LOW (ref >60.00)
Glucose, Bld: 103 mg/dL — ABNORMAL HIGH (ref 70–99)
Potassium: 5.1 meq/L (ref 3.5–5.1)
Sodium: 139 meq/L (ref 135–145)

## 2023-04-08 LAB — HEPATIC FUNCTION PANEL
ALT: 38 U/L (ref 0–53)
AST: 31 U/L (ref 0–37)
Albumin: 4.6 g/dL (ref 3.5–5.2)
Alkaline Phosphatase: 63 U/L (ref 39–117)
Bilirubin, Direct: 0.1 mg/dL (ref 0.0–0.3)
Total Bilirubin: 0.5 mg/dL (ref 0.2–1.2)
Total Protein: 7.3 g/dL (ref 6.0–8.3)

## 2023-04-08 LAB — PSA: PSA: 0.86 ng/mL (ref 0.10–4.00)

## 2023-04-08 LAB — CBC WITH DIFFERENTIAL/PLATELET
Basophils Absolute: 0 10*3/uL (ref 0.0–0.1)
Basophils Relative: 0.7 % (ref 0.0–3.0)
Eosinophils Absolute: 0.2 10*3/uL (ref 0.0–0.7)
Eosinophils Relative: 2.7 % (ref 0.0–5.0)
HCT: 53.6 % — ABNORMAL HIGH (ref 39.0–52.0)
Hemoglobin: 17.1 g/dL — ABNORMAL HIGH (ref 13.0–17.0)
Lymphocytes Relative: 45 % (ref 12.0–46.0)
Lymphs Abs: 3 10*3/uL (ref 0.7–4.0)
MCHC: 31.8 g/dL (ref 30.0–36.0)
MCV: 88.9 fL (ref 78.0–100.0)
Monocytes Absolute: 0.6 10*3/uL (ref 0.1–1.0)
Monocytes Relative: 8.6 % (ref 3.0–12.0)
Neutro Abs: 2.9 10*3/uL (ref 1.4–7.7)
Neutrophils Relative %: 43 % (ref 43.0–77.0)
Platelets: 138 10*3/uL — ABNORMAL LOW (ref 150.0–400.0)
RBC: 6.03 Mil/uL — ABNORMAL HIGH (ref 4.22–5.81)
RDW: 15 % (ref 11.5–15.5)
WBC: 6.7 10*3/uL (ref 4.0–10.5)

## 2023-04-08 LAB — TSH: TSH: 2.63 u[IU]/mL (ref 0.35–5.50)

## 2023-04-08 LAB — VITAMIN D 25 HYDROXY (VIT D DEFICIENCY, FRACTURES): VITD: 36.04 ng/mL (ref 30.00–100.00)

## 2023-04-08 LAB — MICROALBUMIN / CREATININE URINE RATIO
Creatinine,U: 59.1 mg/dL
Microalb Creat Ratio: 1.2 mg/g (ref 0.0–30.0)
Microalb, Ur: <0.7 mg/dL (ref 0.0–1.9)

## 2023-04-08 LAB — VITAMIN B12: Vitamin B-12: 392 pg/mL (ref 211–911)

## 2023-04-08 LAB — HEMOGLOBIN A1C: Hgb A1c MFr Bld: 6.8 % — ABNORMAL HIGH (ref 4.6–6.5)

## 2023-04-13 ENCOUNTER — Encounter: Payer: Self-pay | Admitting: Internal Medicine

## 2023-04-13 ENCOUNTER — Ambulatory Visit (INDEPENDENT_AMBULATORY_CARE_PROVIDER_SITE_OTHER): Payer: 59 | Admitting: Internal Medicine

## 2023-04-13 VITALS — BP 126/78 | HR 70 | Temp 98.8°F | Ht 70.0 in | Wt 218.0 lb

## 2023-04-13 DIAGNOSIS — Z7985 Long-term (current) use of injectable non-insulin antidiabetic drugs: Secondary | ICD-10-CM | POA: Diagnosis not present

## 2023-04-13 DIAGNOSIS — E7800 Pure hypercholesterolemia, unspecified: Secondary | ICD-10-CM

## 2023-04-13 DIAGNOSIS — N1831 Chronic kidney disease, stage 3a: Secondary | ICD-10-CM

## 2023-04-13 DIAGNOSIS — Z125 Encounter for screening for malignant neoplasm of prostate: Secondary | ICD-10-CM

## 2023-04-13 DIAGNOSIS — E1165 Type 2 diabetes mellitus with hyperglycemia: Secondary | ICD-10-CM | POA: Diagnosis not present

## 2023-04-13 DIAGNOSIS — Z23 Encounter for immunization: Secondary | ICD-10-CM

## 2023-04-13 DIAGNOSIS — I10 Essential (primary) hypertension: Secondary | ICD-10-CM | POA: Diagnosis not present

## 2023-04-13 DIAGNOSIS — E559 Vitamin D deficiency, unspecified: Secondary | ICD-10-CM | POA: Diagnosis not present

## 2023-04-13 DIAGNOSIS — D751 Secondary polycythemia: Secondary | ICD-10-CM | POA: Diagnosis not present

## 2023-04-13 DIAGNOSIS — E538 Deficiency of other specified B group vitamins: Secondary | ICD-10-CM

## 2023-04-13 DIAGNOSIS — D696 Thrombocytopenia, unspecified: Secondary | ICD-10-CM | POA: Diagnosis not present

## 2023-04-13 MED ORDER — TIRZEPATIDE 2.5 MG/0.5ML ~~LOC~~ SOAJ: 2.5000 mg | SUBCUTANEOUS | 11 refills | Status: DC

## 2023-04-13 NOTE — Patient Instructions (Signed)
Please take all new medication as prescribed - the mounjaro 2.5 mg weekly, and call or let us know by mychart if you are doing well in 1 month for the medication to be increased to the  next level  Ok to increase the Vit D to 3000 u per day  Please continue all other medications as before, and refills have been done if requested.  Please have the pharmacy call with any other refills you may need.  Please continue your efforts at being more active, low cholesterol diet, and weight control.  You are otherwise up to date with prevention measures today.  Please keep your appointments with your specialists as you may have planned  Please make an Appointment to return in 6 months, or sooner if needed, also with Lab Appointment for testing done 3-5 days before at the FIRST FLOOR Lab (so this is for TWO appointments - please see the scheduling desk as you leave)

## 2023-04-13 NOTE — Progress Notes (Signed)
Patient ID: Matthew Guerrero, male   DOB: March 09, 1956, 67 y.o.   MRN: [id number]        Chief Complaint: follow up HTN, HLD and DM,, low vit d, low b12       HPI:  Matthew Guerrero is a 67 y.o. male here overall doing well.  Pt denies chest pain, increased sob or doe, wheezing, orthopnea, PND, increased LE swelling, palpitations, dizziness or syncope.   Pt denies polydipsia, polyuria, or new focal neuro s/s.    Pt denies fever, wt loss, night sweats, loss of appetite, or other constitutional symptoms    Has f/u with card nov 2024; also last seen per hematology aug 2022 with no recommendation for f/u after labs stable.    Wt Readings from Last 3 Encounters:  04/13/23 218 lb (98.9 kg)  10/15/22 216 lb (98 kg)  10/12/22 216 lb (98 kg)   BP Readings from Last 3 Encounters:  04/13/23 126/78  10/12/22 124/82  10/09/22 128/76         Past Medical History:  Diagnosis Date   ALLERGIC RHINITIS    Allergy    B12 deficiency 04/06/2020   Cervical radiculitis    CHF (congestive heart failure) (HCC)    Chronic systolic heart failure (HCC) 02/28/2015   GERD (gastroesophageal reflux disease)    GOUT    Hyperglycemia 08/28/2014   HYPERLIPIDEMIA    HYPERTENSION    Lateral epicondylitis of right elbow    Nonischemic cardiomyopathy (HCC) 09/28/2014   Obesity (BMI 30-39.9) 06/06/2015   S/P cardiac cath wjith normal coronary arteries  09/28/2014   Sleep apnea    wears CPAP   Thrombocytopenia (HCC) 04/06/2020   Past Surgical History:  Procedure Laterality Date   COLONOSCOPY     COLONOSCOPY WITH PROPOFOL N/A 06/17/2017   Procedure: COLONOSCOPY WITH PROPOFOL;  Surgeon: Rachael Fee, MD;  Location: [facility];  Service: Endoscopy;  Laterality: N/A;   EP IMPLANTABLE DEVICE N/A 03/21/2015   Procedure: SubQ ICD Implant;  Surgeon: Duke Salvia, MD;  Location: [facility] INVASIVE CV LAB;  Service: Cardiovascular;  Laterality: N/A;   LEFT AND RIGHT HEART CATHETERIZATION WITH CORONARY ANGIOGRAM N/A 09/27/2014    Procedure: LEFT AND RIGHT HEART CATHETERIZATION WITH CORONARY ANGIOGRAM;  Surgeon: Lyn Records, MD;  Location: [facility] CATH LAB;  Service: Cardiovascular;  Laterality: N/A;   SUBQ ICD  03/21/2015   SUBQ ICD IMPLANT N/A 02/10/2021   Procedure: SUBQ ICD IMPLANT;  Surgeon: Duke Salvia, MD;  Location: [facility] INVASIVE CV LAB;  Service: Cardiovascular;  Laterality: N/A;    reports that he has never smoked. He has never used smokeless tobacco. He reports that he does not drink alcohol and does not use drugs. family history includes Aneurysm (age of onset: 74) in his sister; Diabetes in his father; Healthy in his brother, brother, and brother; Heart attack (age of onset: 53) in his father; Heart disease in his father; Hypertension in his sister; Lung cancer in his mother. No Known Allergies Current Outpatient Medications on File Prior to Visit  Medication Sig Dispense Refill   ALLERGY RELIEF 180 MG tablet TAKE 1 TABLET(180 MG) BY MOUTH DAILY 30 tablet 2   allopurinol (ZYLOPRIM) 300 MG tablet Take 1 tablet (300 mg total) by mouth daily. 90 tablet 3   aspirin EC 81 MG tablet Take 81 mg by mouth daily. Swallow whole.     dapagliflozin propanediol (FARXIGA) 10 MG TABS tablet Take 1 tablet (10 mg total) by mouth daily before breakfast.  Please call [phone] to schedule a November appointment with Dr. Sherryl Manges for future refills. Thank you. 100 tablet 3   ENTRESTO 97-103 MG TAKE 1 TABLET BY MOUTH TWICE  DAILY 200 tablet 2   furosemide (LASIX) 40 MG tablet TAKE 1 TABLET BY MOUTH DAILY 90 tablet 1   indomethacin (INDOCIN) 50 MG capsule TAKE 1 CAPSULE BY MOUTH 3 TIMES  DAILY AS NEEDED 300 capsule 2   indomethacin (INDOCIN) 50 MG capsule Take 50 mg by mouth 3 (three) times daily.     pantoprazole (PROTONIX) 40 MG tablet Take 1 tablet (40 mg total) by mouth daily. 90 tablet 2   pravastatin (PRAVACHOL) 40 MG tablet Take 1 tablet (40 mg total) by mouth every evening. 90 tablet 2   spironolactone (ALDACTONE) 25  MG tablet TAKE ONE-HALF TABLET BY MOUTH  DAILY 50 tablet 2   vitamin B-12 (CYANOCOBALAMIN) 1000 MCG tablet Take 1 tablet (1,000 mcg total) by mouth daily. 90 tablet 1   No current facility-administered medications on file prior to visit.        ROS:  All others reviewed and negative.  Objective        PE:  BP 126/78 (BP Location: Right Arm, Patient Position: Sitting, Cuff Size: Normal)   Pulse 70   Temp 98.8 F (37.1 C) (Oral)   Ht 5\' 10"  (1.778 m)   Wt 218 lb (98.9 kg)   SpO2 96%   BMI 31.28 kg/m                 Constitutional: Pt appears in NAD               HENT: Head: NCAT.                Right Ear: External ear normal.                 Left Ear: External ear normal.                Eyes: . Pupils are equal, round, and reactive to light. Conjunctivae and EOM are normal               Nose: without d/c or deformity               Neck: Neck supple. Gross normal ROM               Cardiovascular: Normal rate and regular rhythm.                 Pulmonary/Chest: Effort normal and breath sounds without rales or wheezing.                Abd:  Soft, NT, ND, + BS, no organomegaly               Neurological: Pt is alert. At baseline orientation, motor grossly intact               Skin: Skin is warm. No rashes, no other new lesions, LE edema - none               Psychiatric: Pt behavior is normal without agitation   Micro: none  Cardiac tracings I have personally interpreted today:  none  Pertinent Radiological findings (summarize): none   Lab Results  Component Value Date   WBC 6.7 04/08/2023   HGB 17.1 (H) 04/08/2023   HCT 53.6 (H) 04/08/2023   PLT 138.0 (L) 04/08/2023   GLUCOSE 103 (H) 04/08/2023  CHOL 126 04/08/2023   TRIG 189.0 (H) 04/08/2023   HDL 32.80 (L) 04/08/2023   LDLDIRECT 73.0 04/01/2021   LDLCALC 55 04/08/2023   ALT 38 04/08/2023   AST 31 04/08/2023   NA 139 04/08/2023   K 5.1 04/08/2023   CL 103 04/08/2023   CREATININE 1.32 04/08/2023   BUN 14 04/08/2023    CO2 28 04/08/2023   TSH 2.63 04/08/2023   PSA 0.86 04/08/2023   INR 1.2 (H) 03/15/2015   HGBA1C 6.8 (H) 04/08/2023   MICROALBUR <0.7 04/08/2023   Assessment/Plan:  Jules Seliga is a 67 y.o. Black or African American [2] male with  has a past medical history of ALLERGIC RHINITIS, Allergy, B12 deficiency (04/06/2020), Cervical radiculitis, CHF (congestive heart failure) (HCC), Chronic systolic heart failure (HCC) (22/07/5425), GERD (gastroesophageal reflux disease), GOUT, Hyperglycemia (08/28/2014), HYPERLIPIDEMIA, HYPERTENSION, Lateral epicondylitis of right elbow, Nonischemic cardiomyopathy (HCC) (09/28/2014), Obesity (BMI 30-39.9) (06/06/2015), S/P cardiac cath wjith normal coronary arteries  (09/28/2014), Sleep apnea, and Thrombocytopenia (HCC) (04/06/2020).  Vitamin D deficiency Last vitamin D Lab Results  Component Value Date   VD25OH 36.04 04/08/2023   Low, to start oral replacement   Thrombocytopenia (HCC) Lab Results  Component Value Date   PLT 138.0 (L) 04/08/2023   Appears to be resolving ITP per heme,  to f/u any worsening symptoms or concerns  Polycythemia, secondary Lab Results  Component Value Date   WBC 6.7 04/08/2023   HGB 17.1 (H) 04/08/2023   HCT 53.6 (H) 04/08/2023   MCV 88.9 04/08/2023   PLT 138.0 (L) 04/08/2023  Stable mild,  to f/u any worsening symptoms or concerns  Hyperlipidemia Lab Results  Component Value Date   LDLCALC 55 04/08/2023   Stable, pt to continue current statin pravachol 40 qd   Essential hypertension BP Readings from Last 3 Encounters:  04/13/23 126/78  10/12/22 124/82  10/09/22 128/76   Stable, pt to continue medical treatment coreg 25 bid   Diabetes (HCC) Lab Results  Component Value Date   HGBA1C 6.8 (H) 04/08/2023   Uncontrolled, pt to continue current medical treatment farxiga 10 every day, and start mounjaro 2.5 weekly   B12 deficiency Lab Results  Component Value Date   VITAMINB12 392 04/08/2023    Stable, cont oral replacement - b12 1000 mcg qd   CKD (chronic kidney disease) stage 3, GFR 30-59 ml/min (HCC) Lab Results  Component Value Date   CREATININE 1.32 04/08/2023   Stable overall, cont to avoid nephrotoxins Followup: No follow-ups on file.  Oliver Barre, MD 04/17/2023 4:55 PM [facility]

## 2023-04-14 ENCOUNTER — Other Ambulatory Visit: Payer: Self-pay | Admitting: Internal Medicine

## 2023-04-14 ENCOUNTER — Other Ambulatory Visit: Payer: Self-pay

## 2023-04-17 DIAGNOSIS — N183 Chronic kidney disease, stage 3 unspecified: Secondary | ICD-10-CM | POA: Insufficient documentation

## 2023-04-17 NOTE — Assessment & Plan Note (Signed)
Last vitamin D Lab Results  Component Value Date   VD25OH 36.04 04/08/2023   Low, to start oral replacement

## 2023-04-17 NOTE — Assessment & Plan Note (Signed)
Lab Results  Component Value Date   PLT 138.0 (L) 04/08/2023   Appears to be resolving ITP per heme,  to f/u any worsening symptoms or concerns

## 2023-04-17 NOTE — Assessment & Plan Note (Signed)
Lab Results  Component Value Date   CREATININE 1.32 04/08/2023   Stable overall, cont to avoid nephrotoxins

## 2023-04-17 NOTE — Assessment & Plan Note (Signed)
Lab Results  Component Value Date   LDLCALC 55 04/08/2023   Stable, pt to continue current statin pravachol 40 qd

## 2023-04-17 NOTE — Assessment & Plan Note (Signed)
Lab Results  Component Value Date   WBC 6.7 04/08/2023   HGB 17.1 (H) 04/08/2023   HCT 53.6 (H) 04/08/2023   MCV 88.9 04/08/2023   PLT 138.0 (L) 04/08/2023  Stable mild,  to f/u any worsening symptoms or concerns

## 2023-04-17 NOTE — Assessment & Plan Note (Signed)
BP Readings from Last 3 Encounters:  04/13/23 126/78  10/12/22 124/82  10/09/22 128/76   Stable, pt to continue medical treatment coreg 25 bid

## 2023-04-17 NOTE — Assessment & Plan Note (Signed)
Lab Results  Component Value Date   VITAMINB12 392 04/08/2023   Stable, cont oral replacement - b12 1000 mcg qd

## 2023-04-17 NOTE — Assessment & Plan Note (Signed)
Lab Results  Component Value Date   HGBA1C 6.8 (H) 04/08/2023   Uncontrolled, pt to continue current medical treatment farxiga 10 every day, and start mounjaro 2.5 weekly

## 2023-04-21 ENCOUNTER — Other Ambulatory Visit: Payer: Self-pay | Admitting: Internal Medicine

## 2023-04-23 ENCOUNTER — Other Ambulatory Visit: Payer: Self-pay

## 2023-04-30 DIAGNOSIS — G4733 Obstructive sleep apnea (adult) (pediatric): Secondary | ICD-10-CM | POA: Diagnosis not present

## 2023-05-07 ENCOUNTER — Other Ambulatory Visit: Payer: Self-pay | Admitting: Internal Medicine

## 2023-05-10 ENCOUNTER — Other Ambulatory Visit: Payer: Self-pay

## 2023-05-11 ENCOUNTER — Ambulatory Visit (INDEPENDENT_AMBULATORY_CARE_PROVIDER_SITE_OTHER): Payer: 59

## 2023-05-11 DIAGNOSIS — I428 Other cardiomyopathies: Secondary | ICD-10-CM

## 2023-05-11 DIAGNOSIS — I5022 Chronic systolic (congestive) heart failure: Secondary | ICD-10-CM

## 2023-05-11 LAB — CUP PACEART REMOTE DEVICE CHECK
Battery Remaining Percentage: 77 %
Date Time Interrogation Session: 20241119070600
HighPow Impedance: 80 Ohm
Implantable Lead Connection Status: 753985
Implantable Lead Implant Date: 20160929
Implantable Lead Location: 753862
Implantable Lead Model: 3401
Implantable Pulse Generator Implant Date: 20220822
Pulse Gen Serial Number: 166274

## 2023-05-14 ENCOUNTER — Encounter: Payer: Self-pay | Admitting: Internal Medicine

## 2023-05-14 MED ORDER — TIRZEPATIDE 5 MG/0.5ML ~~LOC~~ SOAJ: 5.0000 mg | SUBCUTANEOUS | 3 refills | Status: DC

## 2023-05-16 NOTE — Progress Notes (Unsigned)
Cardiology Office Note Date:  05/16/2023  Patient ID:  Matthew Guerrero, Matthew Guerrero [birth year], MRN [id number] PCP:  Corwin Levins, MD  Cardiologist:  Dr. Rennis Golden Electrophysiologist: Dr. Graciela Husbands OSA: Dr. Mayford Knife   Chief Complaint:    annual device visit  History of Present Illness: Matthew Guerrero is a 67 y.o. male with history of HTN, HLD, gout, asthma, NICM, S-ICD, OSA w/CPAP, chronic CHF (systolic)  The patient comes in today to be seen for Dr. Graciela Husbands.  He last saw hi in Sept 2019, doing well. Dr. Graciela Husbands makes note that he had TWOS in the secondary vector and programmed in the alternate.    I saw him Oct 2020,  he felt very well.  Denied any CP, palpitations or SOB, no DOE or exertional intolerances, no symptoms of PND or orthopnea.  No near syncope or syncope, no shocks. His weight had fluctuated and he mentions that he he had taken a detoxify regime/pills a few months back that he lost several pounds with and then another pill that apparently was to reset everything back and regain his weight but in the right way. I cautioned him on taking such regimes without MD guidance and clearance. His device interrogation was normal, 54% battery used  I saw him 08/02/19 He feels quite well.  No CP, palpitations or exertional incapacities.  He denies any rest or exertional SOB, no symptoms of PND or orthopnea. No dizzy spells, near syncope or syncope. He weighs daily and reports stable weight He is tolerating his medicines, reports Sherryll Burger is covered well for him He was advised of device advisory, battery life looked OK at 50%, alert tone was demonstrated No changes were made.  He saw Dr. Rennis Golden 03/18/2020, was doing well, on GDMT, following as well with Dr. Mayford Knife for his OSA.  Felt to have class I-II symptoms, and despite some improvement in his LVEF when gen change needed to be done, recommended proceeding with another ICD, given likelihood of his EF waxing/waning.  I saw him Oct 2021 He continues  to  Feel quite well.  No CP, palpitations or SOB.  Denies DOE, remains active around his house, yard work, errands.  No symptoms of PND or orthopnea, no dizzy spells, near syncope or syncope. His PMD monitors his labs regularly No shocks No changes were made, battery behavior was stable  He saw Dr. Rennis Golden in March 2022, doing well, class II symptoms, no changes were made.  I saw him April 2022 He continues to do really well No CP, palpitations or cardiac awareness No SOB No dizzy spells, near syncope or syncope. He has not heard any alert tones from his device Device battery depletion as expected No changes were made  underwent GEN change 01/2021  He saw Dr. Graciela Husbands Nov 2022, doing well, no changed were made.  He saw Dr. Rennis Golden 09/18/21, doing well, planned to update his echo  TTE with LVEF 35-40%, global hypokinesis  I saw him  05/18/22 He is doing very well. No CP, palpitations or cardiac awareness No SOB, DOE No near syncope or syncope. He saw his PMD in October had labs Volume stable No changes made  Follows with cardiology, most recently E. Monge, NP 10/09/22, doing well, denied any symptoms, good CPAP compliance, no changes were made.  TODAY He feels great No CP, palpitations or cardiac awareness No SOB, DOE No near syncope or syncope No shocks  Device information BSCi S-ICD, implanted 03/21/15, Dr. Graciela Husbands Device lead sensing configuration is on alternate  2/2 TWOS Implanted for primary prevention GEN change 02/10/21 Device is on advisory for battery depletion   Past Medical History:  Diagnosis Date   ALLERGIC RHINITIS    Allergy    B12 deficiency 04/06/2020   Cervical radiculitis    CHF (congestive heart failure) (HCC)    Chronic systolic heart failure (HCC) 02/28/2015   GERD (gastroesophageal reflux disease)    GOUT    Hyperglycemia 08/28/2014   HYPERLIPIDEMIA    HYPERTENSION    Lateral epicondylitis of right elbow    Nonischemic cardiomyopathy (HCC)  09/28/2014   Obesity (BMI 30-39.9) 06/06/2015   S/P cardiac cath wjith normal coronary arteries  09/28/2014   Sleep apnea    wears CPAP   Thrombocytopenia (HCC) 04/06/2020    Past Surgical History:  Procedure Laterality Date   COLONOSCOPY     COLONOSCOPY WITH PROPOFOL N/A 06/17/2017   Procedure: COLONOSCOPY WITH PROPOFOL;  Surgeon: Rachael Fee, MD;  Location: [facility]OS[facility];  Service: Endoscopy;  Laterality: N/A;   EP IMPLANTABLE DEVICE N/A 03/21/2015   Procedure: SubQ ICD Implant;  Surgeon: Duke Salvia, MD;  Location: [facility] INVASIVE CV LAB;  Service: Cardiovascular;  Laterality: N/A;   LEFT AND RIGHT HEART CATHETERIZATION WITH CORONARY ANGIOGRAM N/A 09/27/2014   Procedure: LEFT AND RIGHT HEART CATHETERIZATION WITH CORONARY ANGIOGRAM;  Surgeon: Lyn Records, MD;  Location: [facility] CATH LAB;  Service: Cardiovascular;  Laterality: N/A;   SUBQ ICD  03/21/2015   SUBQ ICD IMPLANT N/A 02/10/2021   Procedure: SUBQ ICD IMPLANT;  Surgeon: Duke Salvia, MD;  Location: [facility] INVASIVE CV LAB;  Service: Cardiovascular;  Laterality: N/A;    Current Outpatient Medications  Medication Sig Dispense Refill   ALLERGY RELIEF 180 MG tablet TAKE 1 TABLET(180 MG) BY MOUTH DAILY 30 tablet 2   allopurinol (ZYLOPRIM) 300 MG tablet TAKE 1 TABLET BY MOUTH DAILY 90 tablet 3   aspirin EC 81 MG tablet Take 81 mg by mouth daily. Swallow whole.     carvedilol (COREG) 25 MG tablet TAKE 1 TABLET BY MOUTH TWICE  DAILY WITH A MEAL 180 tablet 3   ENTRESTO 97-103 MG TAKE 1 TABLET BY MOUTH TWICE  DAILY 200 tablet 2   FARXIGA 10 MG TABS tablet TAKE 1 TABLET BY MOUTH DAILY  BEFORE BREAKFAST 100 tablet 2   furosemide (LASIX) 40 MG tablet TAKE 1 TABLET BY MOUTH DAILY 90 tablet 1   indomethacin (INDOCIN) 50 MG capsule TAKE 1 CAPSULE BY MOUTH 3 TIMES  DAILY AS NEEDED 300 capsule 2   indomethacin (INDOCIN) 50 MG capsule Take 50 mg by mouth 3 (three) times daily.     pantoprazole (PROTONIX) 40 MG tablet Take 1 tablet (40 mg total) by  mouth daily. 90 tablet 2   pravastatin (PRAVACHOL) 40 MG tablet Take 1 tablet (40 mg total) by mouth every evening. 90 tablet 2   spironolactone (ALDACTONE) 25 MG tablet TAKE ONE-HALF TABLET BY MOUTH  DAILY 50 tablet 2   tirzepatide (MOUNJARO) 5 MG/0.5ML Pen Inject 5 mg into the skin once a week. 6 mL 3   vitamin B-12 (CYANOCOBALAMIN) 1000 MCG tablet Take 1 tablet (1,000 mcg total) by mouth daily. 90 tablet 1   No current facility-administered medications for this visit.    Allergies:   Patient has no known allergies.   Social History:  The patient  reports that he has never smoked. He has never used smokeless tobacco. He reports that he does not drink alcohol and does not use  drugs.   Family History:  The patient's family history includes Aneurysm (age of onset: 90) in his sister; Diabetes in his father; Healthy in his brother, brother, and brother; Heart attack (age of onset: 72) in his father; Heart disease in his father; Hypertension in his sister; Lung cancer in his mother.  ROS:  Please see the history of present illness.  All other systems are reviewed and otherwise negative.   PHYSICAL EXAM:  VS:  There were no vitals taken for this visit. BMI: There is no height or weight on file to calculate BMI. Well nourished, well developed, in no acute distress  HEENT: normocephalic, atraumatic  Neck: no JVD, carotid bruits or masses Cardiac:  RRR; no significant murmurs, no rubs, or gallops Lungs: CTA b/l, no wheezing, rhonchi or rales  Abd: soft, nontender MS: no deformity or atrophy Ext: no edema  Skin: warm and dry, no rash Neuro:  No gross deficits appreciated Psych: euthymic mood, full affect  S-ICD site is stable, no tethering or discomfort   EKG:  done today and reviewed by myself SR,   ICD interrogation done today and reviewed by myself:  Battery is at 76% (as expected) Electrode impedance is 85 Ohms No arrhythmias   10/03/21: TTE 1. GLS average appears to  overestimate function, likely inaccurate. Left  ventricular ejection fraction, by estimation, is 35 to 40%. The left  ventricle has moderately decreased function. The left ventricle  demonstrates global hypokinesis. There is mild  left ventricular hypertrophy. Left ventricular diastolic parameters are  consistent with Grade I diastolic dysfunction (impaired relaxation).   2. Right ventricular systolic function is mildly reduced. The right  ventricular size is normal. Tricuspid regurgitation signal is inadequate  for assessing PA pressure.   3. Hypermoble septum, no significant PFO visualized.   4. The mitral valve is degenerative. Trivial mitral valve regurgitation.   5. Aortic valve regurgitation is not visualized. Aortic valve  sclerosis/calcification is present, without any evidence of aortic  stenosis.   6. The inferior vena cava is normal in size with greater than 50%  respiratory variability, suggesting right atrial pressure of 3 mmHg.   Comparison(s): No significant change from prior study. 03/05/20 EF 35-40%.   03/05/2020 TTE IMPRESSIONS   1. Left ventricular ejection fraction, by estimation, is 35 to 40%. The  left ventricle has moderately decreased function. The left ventricle  demonstrates global hypokinesis. Left ventricular diastolic parameters are  consistent with Grade I diastolic  dysfunction (impaired relaxation).   2. Right ventricular systolic function is normal. The right ventricular  size is normal. Tricuspid regurgitation signal is inadequate for assessing  PA pressure.   3. The mitral valve is grossly normal. Trivial mitral valve  regurgitation. No evidence of mitral stenosis.   4. The aortic valve is tricuspid. Aortic valve regurgitation is trivial.  No aortic stenosis is present.   5. The inferior vena cava is normal in size with greater than 50%  respiratory variability, suggesting right atrial pressure of 3 mmHg.   Comparison(s): Changes from prior study  are noted. EF has improved to  35-40%. LV dimensions within normal limits.    03/14/18: TTE Study Conclusions - Left ventricle: The cavity size was severely dilated. Wall   thickness was normal. The estimated ejection fraction was 25%.   Diffuse hypokinesis. - Atrial septum: No defect or patent foramen ovale was identified.     Recent Labs: 04/08/2023: ALT 38; BUN 14; Creatinine, Ser 1.32; Hemoglobin 17.1; Platelets 138.0; Potassium 5.1;  Sodium 139; TSH 2.63  04/08/2023: Cholesterol 126; HDL 32.80; LDL Cholesterol 55; Total CHOL/HDL Ratio 4; Triglycerides 189.0; VLDL 37.8   CrCl cannot be calculated (Patient's most recent lab result is older than the maximum 21 days allowed.).   Wt Readings from Last 3 Encounters:  04/13/23 218 lb (98.9 kg)  10/15/22 216 lb (98 kg)  10/12/22 216 lb (98 kg)     Other studies reviewed: Additional studies/records reviewed today include: summarized above  ASSESSMENT AND PLAN:  1. S-ICD     Intact function     No programming changes made       2. NICM 3. Chronic CHF (systolic)     On BB, entresto, diuretics     No symptoms or exam findings to suggest fluid OL     C/w Dr. Coy Saunas            4. HTN     Looks good    Disposition:  remotes as usual, in clinic with Dr. Graciela Husbands in a year, sooner if needed    Current medicines are reviewed at length with the patient today.  The patient did not have any concerns regarding medicines.  Norma Fredrickson, PA-C 05/16/2023 2:38 PM     [facility] [address] [place] [address] [phone] (office)  [phone] (fax)

## 2023-05-18 ENCOUNTER — Ambulatory Visit: Payer: 59 | Attending: Internal Medicine | Admitting: Physician Assistant

## 2023-05-18 ENCOUNTER — Encounter: Payer: 59 | Admitting: Internal Medicine

## 2023-05-18 ENCOUNTER — Encounter: Payer: Self-pay | Admitting: Physician Assistant

## 2023-05-18 VITALS — BP 108/70 | HR 82 | Ht 70.0 in | Wt 207.6 lb

## 2023-05-18 DIAGNOSIS — I5022 Chronic systolic (congestive) heart failure: Secondary | ICD-10-CM

## 2023-05-18 DIAGNOSIS — I10 Essential (primary) hypertension: Secondary | ICD-10-CM | POA: Diagnosis not present

## 2023-05-18 DIAGNOSIS — Z95810 Presence of automatic (implantable) cardiac defibrillator: Secondary | ICD-10-CM | POA: Diagnosis not present

## 2023-05-18 DIAGNOSIS — I428 Other cardiomyopathies: Secondary | ICD-10-CM | POA: Diagnosis not present

## 2023-05-18 LAB — CUP PACEART INCLINIC DEVICE CHECK
Date Time Interrogation Session: 20241126164124
Implantable Lead Connection Status: 753985
Implantable Lead Implant Date: 20160929
Implantable Lead Location: 753862
Implantable Lead Model: 3401
Implantable Pulse Generator Implant Date: 20220822
Pulse Gen Serial Number: 166274

## 2023-05-18 NOTE — Patient Instructions (Signed)
Medication Instructions:  No changes *If you need a refill on your cardiac medications before your next appointment, please call your pharmacy*   Lab Work: none If you have labs (blood work) drawn today and your tests are completely normal, you will receive your results only by: MyChart Message (if you have MyChart) OR A paper copy in the mail If you have any lab test that is abnormal or we need to change your treatment, we will call you to review the results.   Testing/Procedures: none   Follow-Up: At [facility], you and your health needs are our priority.  As part of our continuing mission to provide you with exceptional heart care, we have created designated Provider Care Teams.  These Care Teams include your primary Cardiologist (physician) and Advanced Practice Providers (APPs -  Physician Assistants and Nurse Practitioners) who all work together to provide you with the care you need, when you need it.   Your next appointment:   12 month(s)  Provider:   Sherryl Manges, MD

## 2023-05-22 DIAGNOSIS — G4733 Obstructive sleep apnea (adult) (pediatric): Secondary | ICD-10-CM | POA: Diagnosis not present

## 2023-06-07 NOTE — Progress Notes (Signed)
Remote ICD transmission.   

## 2023-06-28 ENCOUNTER — Encounter: Payer: Self-pay | Admitting: Internal Medicine

## 2023-07-02 ENCOUNTER — Other Ambulatory Visit: Payer: Self-pay | Admitting: Internal Medicine

## 2023-07-02 ENCOUNTER — Other Ambulatory Visit: Payer: Self-pay

## 2023-07-02 DIAGNOSIS — E785 Hyperlipidemia, unspecified: Secondary | ICD-10-CM

## 2023-07-02 MED ORDER — PRAVASTATIN SODIUM 40 MG PO TABS: 40.0000 mg | ORAL_TABLET | Freq: Every evening | ORAL | 2 refills | Status: DC

## 2023-07-02 MED ORDER — PANTOPRAZOLE SODIUM 40 MG PO TBEC: 40.0000 mg | DELAYED_RELEASE_TABLET | Freq: Every day | ORAL | 2 refills | Status: DC

## 2023-07-08 ENCOUNTER — Encounter: Payer: Self-pay | Admitting: Cardiology

## 2023-07-08 ENCOUNTER — Ambulatory Visit: Payer: 59 | Attending: Cardiology | Admitting: Cardiology

## 2023-07-08 ENCOUNTER — Ambulatory Visit: Payer: 59 | Admitting: Cardiology

## 2023-07-08 VITALS — BP 110/64 | HR 80 | Ht 70.0 in | Wt 200.6 lb

## 2023-07-08 DIAGNOSIS — G4733 Obstructive sleep apnea (adult) (pediatric): Secondary | ICD-10-CM

## 2023-07-08 DIAGNOSIS — I10 Essential (primary) hypertension: Secondary | ICD-10-CM

## 2023-07-08 NOTE — Progress Notes (Signed)
Date:  07/08/2023   ID:  Matthew Guerrero, DOB 1956-03-14, MRN [id number]  PCP:  Corwin Levins, MD  Cardiologist:  Chrystie Nose, MD  Sleep Medicine:  Armanda Magic, MD Electrophysiologist:  Sherryl Manges, MD   Chief Complaint:  OSA  History of Present Illness:    Matthew Guerrero is a 68 y.o. male with a hx of moderate OSA with an AHI of 23/hr and mild to moderate snoring and associated nocturnal hypoxemia with his respiratory events as low as 71%.  He was titrated to 9 cm H2O.    He is doing well with his PAP device and thinks that he has gotten used to it.  He tolerates the nasal pillow mask and feels the pressure is adequate.  Since going on PAP he feels rested in the am and has no significant daytime sleepiness.  He denies any significant mouth or nasal dryness or nasal congestion.  He does not think that he snores.      Prior CV studies:   The following studies were reviewed today:  PAP compliance data  Past Medical History:  Diagnosis Date   ALLERGIC RHINITIS    Allergy    B12 deficiency 04/06/2020   Cervical radiculitis    CHF (congestive heart failure) (HCC)    Chronic systolic heart failure (HCC) 02/28/2015   GERD (gastroesophageal reflux disease)    GOUT    Hyperglycemia 08/28/2014   HYPERLIPIDEMIA    HYPERTENSION    Lateral epicondylitis of right elbow    Nonischemic cardiomyopathy (HCC) 09/28/2014   Obesity (BMI 30-39.9) 06/06/2015   S/P cardiac cath wjith normal coronary arteries  09/28/2014   Sleep apnea    wears CPAP   Thrombocytopenia (HCC) 04/06/2020   Past Surgical History:  Procedure Laterality Date   COLONOSCOPY     COLONOSCOPY WITH PROPOFOL N/A 06/17/2017   Procedure: COLONOSCOPY WITH PROPOFOL;  Surgeon: Rachael Fee, MD;  Location: [facility] ENDOSCOPY;  Service: Endoscopy;  Laterality: N/A;   EP IMPLANTABLE DEVICE N/A 03/21/2015   Procedure: SubQ ICD Implant;  Surgeon: Duke Salvia, MD;  Location: [facility] INVASIVE CV LAB;  Service: Cardiovascular;   Laterality: N/A;   LEFT AND RIGHT HEART CATHETERIZATION WITH CORONARY ANGIOGRAM N/A 09/27/2014   Procedure: LEFT AND RIGHT HEART CATHETERIZATION WITH CORONARY ANGIOGRAM;  Surgeon: Lyn Records, MD;  Location: [facility] CATH LAB;  Service: Cardiovascular;  Laterality: N/A;   SUBQ ICD  03/21/2015   SUBQ ICD IMPLANT N/A 02/10/2021   Procedure: SUBQ ICD IMPLANT;  Surgeon: Duke Salvia, MD;  Location: [facility] INVASIVE CV LAB;  Service: Cardiovascular;  Laterality: N/A;     Current Meds  Medication Sig   allopurinol (ZYLOPRIM) 300 MG tablet TAKE 1 TABLET BY MOUTH DAILY   aspirin EC 81 MG tablet Take 81 mg by mouth daily. Swallow whole.   carvedilol (COREG) 25 MG tablet TAKE 1 TABLET BY MOUTH TWICE  DAILY WITH A MEAL   ENTRESTO 97-103 MG TAKE 1 TABLET BY MOUTH TWICE  DAILY   FARXIGA 10 MG TABS tablet TAKE 1 TABLET BY MOUTH DAILY  BEFORE BREAKFAST   furosemide (LASIX) 40 MG tablet TAKE 1 TABLET BY MOUTH DAILY   indomethacin (INDOCIN) 50 MG capsule TAKE 1 CAPSULE BY MOUTH 3 TIMES  DAILY AS NEEDED   pantoprazole (PROTONIX) 40 MG tablet Take 1 tablet (40 mg total) by mouth daily.   pravastatin (PRAVACHOL) 40 MG tablet Take 1 tablet (40 mg total) by mouth every evening.   spironolactone (ALDACTONE)  25 MG tablet TAKE ONE-HALF TABLET BY MOUTH  DAILY   tirzepatide (MOUNJARO) 5 MG/0.5ML Pen Inject 5 mg into the skin once a week.   vitamin B-12 (CYANOCOBALAMIN) 1000 MCG tablet Take 1 tablet (1,000 mcg total) by mouth daily.     Allergies:   Patient has no known allergies.   Social History   Tobacco Use   Smoking status: Never   Smokeless tobacco: Never  Vaping Use   Vaping status: Never Used  Substance Use Topics   Alcohol use: No    Alcohol/week: 0.0 standard drinks of alcohol   Drug use: No     Family Hx: The patient's family history includes Aneurysm (age of onset: 70) in his sister; Diabetes in his father; Healthy in his brother, brother, and brother; Heart attack (age of onset: 69) in his father;  Heart disease in his father; Hypertension in his sister; Lung cancer in his mother. There is no history of Colon cancer, Esophageal cancer, Rectal cancer, or Stomach cancer.  ROS:   Please see the history of present illness.     All other systems reviewed and are negative.   Labs/Other Tests and Data Reviewed:    Recent Labs: 04/08/2023: ALT 38; BUN 14; Creatinine, Ser 1.32; Hemoglobin 17.1; Platelets 138.0; Potassium 5.1; Sodium 139; TSH 2.63   Recent Lipid Panel Lab Results  Component Value Date/Time   CHOL 126 04/08/2023 07:56 AM   CHOL 134 10/21/2016 02:38 PM   TRIG 189.0 (H) 04/08/2023 07:56 AM   HDL 32.80 (L) 04/08/2023 07:56 AM   HDL 35 (L) 10/21/2016 02:38 PM   CHOLHDL 4 04/08/2023 07:56 AM   LDLCALC 55 04/08/2023 07:56 AM   LDLCALC 68 10/21/2016 02:38 PM   LDLDIRECT 73.0 04/01/2021 07:47 AM    Wt Readings from Last 3 Encounters:  07/08/23 200 lb 9.6 oz (91 kg)  05/18/23 207 lb 9.6 oz (94.2 kg)  04/13/23 218 lb (98.9 kg)     Objective:    Vital Signs:  BP 110/64   Pulse 80   Ht 5\' 10"  (1.778 m)   Wt 200 lb 9.6 oz (91 kg)   SpO2 96%   BMI 28.78 kg/m  GEN: Well nourished, well developed in no acute distress HEENT: Normal NECK: No JVD; No carotid bruits LYMPHATICS: No lymphadenopathy CARDIAC:RRR, no murmurs, rubs, gallops RESPIRATORY:  Clear to auscultation without rales, wheezing or rhonchi  ABDOMEN: Soft, non-tender, non-distended MUSCULOSKELETAL:  No edema; No deformity  SKIN: Warm and dry NEUROLOGIC:  Alert and oriented x 3 PSYCHIATRIC:  Normal affect  ASSESSMENT & PLAN:    1.  OSA - The patient is tolerating PAP therapy well without any problems. The PAP download performed by his DME was personally reviewed and interpreted by me today and showed an AHI of 1.6 /hr on auto CPAP from 4-18 cm H2O with 93% compliance in using more than 4 hours nightly.  The patient has been using and benefiting from PAP use and will continue to benefit from therapy.    2.   Hypertension -BP controlled on exam today -Continue prescription drug management with carvedilol 25 mg twice daily, Entresto 97-23 mg twice daily, spironolactone 12.5 mg daily with as needed refills -I have personally reviewed and interpreted outside labs performed by patient's PCP which showed serum creatinine 1.32 and potassium 5.1 on 04/08/2023  Medication Adjustments/Labs and Tests Ordered: Current medicines are reviewed at length with the patient today.  Concerns regarding medicines are outlined above.  Tests Ordered: No  orders of the defined types were placed in this encounter.   Medication Changes: No orders of the defined types were placed in this encounter.    Disposition:  Follow up in 1 year(s)  Signed, Armanda Magic, MD  07/08/2023 3:39 PM    [facility]

## 2023-07-08 NOTE — Patient Instructions (Signed)
Medication Instructions:  Your physician recommends that you continue on your current medications as directed. Please refer to the Current Medication list given to you today.  *If you need a refill on your cardiac medications before your next appointment, please call your pharmacy*  Lab Work: None ordered today. If you have labs (blood work) drawn today and your tests are completely normal, you will receive your results only by: MyChart Message (if you have MyChart) OR A paper copy in the mail If you have any lab test that is abnormal or we need to change your treatment, we will call you to review the results.  Testing/Procedures: None ordered today.  Follow-Up: At [facility], you and your health needs are our priority.  As part of our continuing mission to provide you with exceptional heart care, we have created designated Provider Care Teams.  These Care Teams include your primary Cardiologist (physician) and Advanced Practice Providers (APPs -  Physician Assistants and Nurse Practitioners) who all work together to provide you with the care you need, when you need it.  We recommend signing up for the patient portal called "MyChart".  Sign up information is provided on this After Visit Summary.  MyChart is used to connect with patients for Virtual Visits (Telemedicine).  Patients are able to view lab/test results, encounter notes, upcoming appointments, etc.  Non-urgent messages can be sent to your provider as well.   To learn more about what you can do with MyChart, go to [email or web address].    Your next appointment:   1 year(s)  The format for your next appointment:   In Person  Provider:   Armanda Magic, MD {

## 2023-07-22 ENCOUNTER — Other Ambulatory Visit: Payer: Self-pay | Admitting: Internal Medicine

## 2023-08-10 ENCOUNTER — Ambulatory Visit (INDEPENDENT_AMBULATORY_CARE_PROVIDER_SITE_OTHER): Payer: 59

## 2023-08-10 DIAGNOSIS — I428 Other cardiomyopathies: Secondary | ICD-10-CM | POA: Diagnosis not present

## 2023-08-11 LAB — CUP PACEART REMOTE DEVICE CHECK
Battery Remaining Percentage: 74 %
Date Time Interrogation Session: 20250218071100
HighPow Impedance: 80 Ohm
Implantable Lead Connection Status: 753985
Implantable Lead Implant Date: 20160929
Implantable Lead Location: 753862
Implantable Lead Model: 3401
Implantable Pulse Generator Implant Date: 20220822
Pulse Gen Serial Number: 166274

## 2023-08-24 ENCOUNTER — Encounter: Payer: Self-pay | Admitting: Internal Medicine

## 2023-08-25 MED ORDER — AZELASTINE HCL 0.05 % OP SOLN: 1.0000 [drp] | Freq: Two times a day (BID) | OPHTHALMIC | 12 refills | Status: AC

## 2023-08-25 MED ORDER — PREDNISONE 10 MG PO TABS: ORAL_TABLET | ORAL | 0 refills | Status: DC

## 2023-09-07 ENCOUNTER — Encounter: Payer: Self-pay | Admitting: Internal Medicine

## 2023-09-09 DIAGNOSIS — G4733 Obstructive sleep apnea (adult) (pediatric): Secondary | ICD-10-CM | POA: Diagnosis not present

## 2023-09-15 ENCOUNTER — Telehealth: Payer: Self-pay | Admitting: *Deleted

## 2023-09-15 ENCOUNTER — Other Ambulatory Visit (INDEPENDENT_AMBULATORY_CARE_PROVIDER_SITE_OTHER)

## 2023-09-15 ENCOUNTER — Telehealth: Payer: Self-pay

## 2023-09-15 DIAGNOSIS — E538 Deficiency of other specified B group vitamins: Secondary | ICD-10-CM

## 2023-09-15 DIAGNOSIS — E1165 Type 2 diabetes mellitus with hyperglycemia: Secondary | ICD-10-CM | POA: Diagnosis not present

## 2023-09-15 DIAGNOSIS — E559 Vitamin D deficiency, unspecified: Secondary | ICD-10-CM | POA: Diagnosis not present

## 2023-09-15 DIAGNOSIS — Z125 Encounter for screening for malignant neoplasm of prostate: Secondary | ICD-10-CM | POA: Diagnosis not present

## 2023-09-15 LAB — HEPATIC FUNCTION PANEL
ALT: 21 U/L (ref 0–53)
AST: 18 U/L (ref 0–37)
Albumin: 4.6 g/dL (ref 3.5–5.2)
Alkaline Phosphatase: 57 U/L (ref 39–117)
Bilirubin, Direct: 0.1 mg/dL (ref 0.0–0.3)
Total Bilirubin: 0.5 mg/dL (ref 0.2–1.2)
Total Protein: 6.9 g/dL (ref 6.0–8.3)

## 2023-09-15 LAB — CBC WITH DIFFERENTIAL/PLATELET
Basophils Absolute: 0 10*3/uL (ref 0.0–0.1)
Basophils Relative: 0.3 % (ref 0.0–3.0)
Eosinophils Absolute: 0.1 10*3/uL (ref 0.0–0.7)
Eosinophils Relative: 1.3 % (ref 0.0–5.0)
HCT: 46.3 % (ref 39.0–52.0)
Hemoglobin: 14.9 g/dL (ref 13.0–17.0)
Lymphocytes Relative: 44.8 % (ref 12.0–46.0)
Lymphs Abs: 4.1 10*3/uL — ABNORMAL HIGH (ref 0.7–4.0)
MCHC: 32.2 g/dL (ref 30.0–36.0)
MCV: 89.8 fl (ref 78.0–100.0)
Monocytes Absolute: 0.6 10*3/uL (ref 0.1–1.0)
Monocytes Relative: 7.1 % (ref 3.0–12.0)
Neutro Abs: 4.3 10*3/uL (ref 1.4–7.7)
Neutrophils Relative %: 46.5 % (ref 43.0–77.0)
Platelets: 149 10*3/uL — ABNORMAL LOW (ref 150.0–400.0)
RBC: 5.15 Mil/uL (ref 4.22–5.81)
RDW: 14.6 % (ref 11.5–15.5)
WBC: 9.2 10*3/uL (ref 4.0–10.5)

## 2023-09-15 LAB — LIPID PANEL
Cholesterol: 118 mg/dL (ref 0–200)
HDL: 36.4 mg/dL — ABNORMAL LOW (ref >39.00)
LDL Cholesterol: 51 mg/dL (ref 0–99)
NonHDL: 81.74
Total CHOL/HDL Ratio: 3
Triglycerides: 154 mg/dL — ABNORMAL HIGH (ref 0.0–149.0)
VLDL: 30.8 mg/dL (ref 0.0–40.0)

## 2023-09-15 LAB — BASIC METABOLIC PANEL WITH GFR
BUN: 15 mg/dL (ref 6–23)
CO2: 29 meq/L (ref 19–32)
Calcium: 9.2 mg/dL (ref 8.4–10.5)
Chloride: 100 meq/L (ref 96–112)
Creatinine, Ser: 1.42 mg/dL (ref 0.40–1.50)
GFR: 50.58 mL/min — ABNORMAL LOW (ref >60.00)
Glucose, Bld: 88 mg/dL (ref 70–99)
Potassium: 3.9 meq/L (ref 3.5–5.1)
Sodium: 136 meq/L (ref 135–145)

## 2023-09-15 LAB — MICROALBUMIN / CREATININE URINE RATIO
Creatinine,U: 65.3 mg/dL
Microalb Creat Ratio: UNDETERMINED mg/g (ref 0.0–30.0)
Microalb, Ur: 0 mg/dL (ref 0.0–1.9)

## 2023-09-15 LAB — HEMOGLOBIN A1C: Hgb A1c MFr Bld: 5.9 % (ref 4.6–6.5)

## 2023-09-15 NOTE — Telephone Encounter (Signed)
 Reviewed SUB Q ICD transmission:  Patient received a shock at 2255 after he and his lady friend were having some "heavy activity".  He then went to put his CPAP on and the ICD device fired.  He was asymptomatic throughout the experience and is doing well since.    Transmission reviewed: Shock delivered was inappropriate due to oversensing of noise during his "active event".  Joey with [employer] reviews and agrees. Not a true VT event.   Patient is coming to the clinic tomorrow at 3pm to meet with Joey to make some adjustments to the sensing of the device.

## 2023-09-15 NOTE — Telephone Encounter (Signed)
 To [employer]: Matthew Guerrero is asking for a piece that fits over his nasal pillows to stop the sores that come in his nose. Please tell me what that is so DR Mayford Knife can write for that please.    Per [employer]: NASAL PADS, THEY CAN BE ORDERED OFF OF [employer].

## 2023-09-15 NOTE — Telephone Encounter (Signed)
 Transmission received 09/15/2023.

## 2023-09-15 NOTE — Telephone Encounter (Signed)
 The pt states he felt like he got a shock. He is not at home to send a transmission. I told him when he get home to give me a call and I will make sure the transmission came through.

## 2023-09-16 ENCOUNTER — Ambulatory Visit: Attending: Cardiology

## 2023-09-16 DIAGNOSIS — I428 Other cardiomyopathies: Secondary | ICD-10-CM

## 2023-09-16 LAB — URINALYSIS, ROUTINE W REFLEX MICROSCOPIC
Bilirubin Urine: NEGATIVE
Hgb urine dipstick: NEGATIVE
Ketones, ur: NEGATIVE
Leukocytes,Ua: NEGATIVE
Nitrite: NEGATIVE
RBC / HPF: NONE SEEN (ref 0–?)
Specific Gravity, Urine: <=1.005 — AB (ref 1.000–1.030)
Total Protein, Urine: NEGATIVE
Urine Glucose: 500 — AB
Urobilinogen, UA: 0.2 (ref 0.0–1.0)
WBC, UA: NONE SEEN (ref 0–?)
pH: 6 (ref 5.0–8.0)

## 2023-09-16 LAB — PSA: PSA: 0.98 ng/mL (ref 0.10–4.00)

## 2023-09-16 LAB — VITAMIN D 25 HYDROXY (VIT D DEFICIENCY, FRACTURES): VITD: 49.5 ng/mL (ref 30.00–100.00)

## 2023-09-16 LAB — TSH: TSH: 2.05 u[IU]/mL (ref 0.35–5.50)

## 2023-09-16 LAB — VITAMIN B12: Vitamin B-12: 322 pg/mL (ref 211–911)

## 2023-09-16 NOTE — Progress Notes (Signed)
 Remote ICD transmission.

## 2023-09-16 NOTE — Progress Notes (Signed)
 Patient seen in device clinic for programming changes by [facility] from [employer]. See PDF for further details.

## 2023-09-16 NOTE — Addendum Note (Signed)
 Addended by: Elease Etienne A on: 09/16/2023 10:06 AM   Modules accepted: Orders

## 2023-09-20 ENCOUNTER — Other Ambulatory Visit: Payer: Self-pay | Admitting: Internal Medicine

## 2023-09-20 ENCOUNTER — Encounter (HOSPITAL_BASED_OUTPATIENT_CLINIC_OR_DEPARTMENT_OTHER): Payer: Self-pay | Admitting: Internal Medicine

## 2023-09-20 ENCOUNTER — Telehealth: Payer: Self-pay | Admitting: Internal Medicine

## 2023-09-20 LAB — CUP PACEART INCLINIC DEVICE CHECK
Date Time Interrogation Session: 20250327162336
Implantable Lead Connection Status: 753985
Implantable Lead Implant Date: 20160929
Implantable Lead Location: 753862
Implantable Lead Model: 3401
Implantable Pulse Generator Implant Date: 20220822
Pulse Gen Serial Number: 166274

## 2023-09-20 MED ORDER — ENTRESTO 97-103 MG PO TABS: 1.0000 | ORAL_TABLET | Freq: Two times a day (BID) | ORAL | 0 refills | Status: DC

## 2023-09-20 NOTE — Telephone Encounter (Signed)
 Pt's medication was sent to pt's pharmacy as requested. Confirmation received.

## 2023-09-20 NOTE — Telephone Encounter (Signed)
*  STAT* If patient is at the pharmacy, call can be transferred to refill team.   1. Which medications need to be refilled? (please list name of each medication and dose if known)   ENTRESTO 97-103 MG   2. Would you like to learn more about the convenience, safety, & potential cost savings by using the [facility]?   3. Are you open to using the [facility] (Type [facility]. ).  4. Which pharmacy/location (including street and city if local pharmacy) is medication to be sent to?  [facility]) - [place], [place] - [address] [place]   5. Do they need a 30 day or 90 day supply?   90 day  Patient stated he still has some medication.

## 2023-09-22 NOTE — Telephone Encounter (Signed)
 Yes related toTW oversensing

## 2023-09-27 ENCOUNTER — Encounter: Payer: Self-pay | Admitting: Internal Medicine

## 2023-09-27 ENCOUNTER — Ambulatory Visit (INDEPENDENT_AMBULATORY_CARE_PROVIDER_SITE_OTHER): Payer: 59

## 2023-09-27 ENCOUNTER — Ambulatory Visit (INDEPENDENT_AMBULATORY_CARE_PROVIDER_SITE_OTHER): Payer: 59 | Admitting: Internal Medicine

## 2023-09-27 VITALS — BP 120/62 | HR 90 | Ht 67.0 in | Wt 197.0 lb

## 2023-09-27 VITALS — BP 120/62 | HR 90 | Temp 97.8°F | Ht 67.0 in | Wt 197.0 lb

## 2023-09-27 DIAGNOSIS — I428 Other cardiomyopathies: Secondary | ICD-10-CM | POA: Diagnosis not present

## 2023-09-27 DIAGNOSIS — E785 Hyperlipidemia, unspecified: Secondary | ICD-10-CM

## 2023-09-27 DIAGNOSIS — E538 Deficiency of other specified B group vitamins: Secondary | ICD-10-CM | POA: Diagnosis not present

## 2023-09-27 DIAGNOSIS — Z0000 Encounter for general adult medical examination without abnormal findings: Secondary | ICD-10-CM

## 2023-09-27 DIAGNOSIS — Z7985 Long-term (current) use of injectable non-insulin antidiabetic drugs: Secondary | ICD-10-CM | POA: Diagnosis not present

## 2023-09-27 DIAGNOSIS — E559 Vitamin D deficiency, unspecified: Secondary | ICD-10-CM | POA: Diagnosis not present

## 2023-09-27 DIAGNOSIS — E1165 Type 2 diabetes mellitus with hyperglycemia: Secondary | ICD-10-CM | POA: Diagnosis not present

## 2023-09-27 DIAGNOSIS — Z0001 Encounter for general adult medical examination with abnormal findings: Secondary | ICD-10-CM

## 2023-09-27 DIAGNOSIS — I10 Essential (primary) hypertension: Secondary | ICD-10-CM | POA: Diagnosis not present

## 2023-09-27 DIAGNOSIS — N1831 Chronic kidney disease, stage 3a: Secondary | ICD-10-CM

## 2023-09-27 MED ORDER — ALLOPURINOL 300 MG PO TABS: 300.0000 mg | ORAL_TABLET | Freq: Every day | ORAL | 3 refills | Status: DC

## 2023-09-27 MED ORDER — CARVEDILOL 25 MG PO TABS: 25.0000 mg | ORAL_TABLET | Freq: Two times a day (BID) | ORAL | 3 refills | Status: DC

## 2023-09-27 MED ORDER — DAPAGLIFLOZIN PROPANEDIOL 10 MG PO TABS: 10.0000 mg | ORAL_TABLET | Freq: Every day | ORAL | 3 refills | Status: DC

## 2023-09-27 MED ORDER — PANTOPRAZOLE SODIUM 40 MG PO TBEC: 40.0000 mg | DELAYED_RELEASE_TABLET | Freq: Every day | ORAL | 3 refills | Status: DC

## 2023-09-27 MED ORDER — PRAVASTATIN SODIUM 40 MG PO TABS: 40.0000 mg | ORAL_TABLET | Freq: Every evening | ORAL | 3 refills | Status: DC

## 2023-09-27 MED ORDER — TIRZEPATIDE 7.5 MG/0.5ML ~~LOC~~ SOAJ: 7.5000 mg | SUBCUTANEOUS | 3 refills | Status: DC

## 2023-09-27 NOTE — Assessment & Plan Note (Signed)
 Lab Results  Component Value Date   VITAMINB12 322 09/15/2023   Stable, cont oral replacement - b12 1000 mcg qd

## 2023-09-27 NOTE — Patient Instructions (Signed)
 Ok to increase the mounjaro to 7.5 mg weekly  Please continue all other medications as before, and refills have been done  Please have the pharmacy call with any other refills you may need.  Please continue your efforts at being more active, low cholesterol diet, and weight control.  You are otherwise up to date with prevention measures today.  Please keep your appointments with your specialists as you may have planned  Please make an Appointment to return in 6 months, or sooner if needed, also with Lab Appointment for testing done 3-5 days before at the FIRST FLOOR Lab (so this is for TWO appointments - please see the scheduling desk as you leave)

## 2023-09-27 NOTE — Assessment & Plan Note (Signed)
 BP Readings from Last 3 Encounters:  09/27/23 120/62  09/27/23 120/62  07/08/23 110/64   Stable, pt to continue medical treatment coreg 25 bid

## 2023-09-27 NOTE — Assessment & Plan Note (Signed)

## 2023-09-27 NOTE — Assessment & Plan Note (Signed)
 Lab Results  Component Value Date   CREATININE 1.42 09/15/2023   Stable overall, cont to avoid nephrotoxins

## 2023-09-27 NOTE — Progress Notes (Signed)
 Subjective:   Matthew Guerrero is a 69 y.o. who presents for a Medicare Wellness preventive visit.  Visit Complete: In person  Persons Participating in Visit: Patient.  AWV Questionnaire: No: Patient Medicare AWV questionnaire was not completed prior to this visit.  Cardiac Risk Factors include: advanced age (>16men, >61 women);diabetes mellitus;dyslipidemia;hypertension;male gender;obesity (BMI >30kg/m2)     Objective:    Today's Vitals   09/27/23 1355  BP: 120/62  Pulse: 90  SpO2: 97%  Weight: 197 lb (89.4 kg)  Height: 5\' 7"  (1.702 m)   Body mass index is 30.85 kg/m.     09/27/2023    1:54 PM 10/15/2022    8:54 AM 10/08/2021    3:12 PM 02/10/2021    7:44 AM 10/07/2020    4:00 PM 10/02/2019    8:31 AM 08/18/2018    2:21 PM  Advanced Directives  Does Patient Have a Medical Advance Directive? Yes Yes Yes Yes Yes Yes No  Type of Estate agent of [place];Living will Healthcare Power of [place];Living will Healthcare Power of [employer] of [place];Living will  Healthcare Power of Attorney   Does patient want to make changes to medical advance directive? No - Patient declined No - Patient declined No - Patient declined  No - Patient declined No - Patient declined   Copy of Healthcare Power of Attorney in Chart? Yes - validated most recent copy scanned in chart (See row information) Yes - validated most recent copy scanned in chart (See row information) Yes - validated most recent copy scanned in chart (See row information)   Yes - validated most recent copy scanned in chart (See row information)   Would patient like information on creating a medical advance directive?       Yes (ED - Information included in AVS)    Current Medications (verified) Outpatient Encounter Medications as of 09/27/2023  Medication Sig   allopurinol (ZYLOPRIM) 300 MG tablet TAKE 1 TABLET BY MOUTH DAILY   aspirin EC 81 MG tablet Take 81 mg by mouth daily. Swallow whole.    azelastine (OPTIVAR) 0.05 % ophthalmic solution Place 1 drop into both eyes 2 (two) times daily.   carvedilol (COREG) 25 MG tablet TAKE 1 TABLET BY MOUTH TWICE  DAILY WITH A MEAL   FARXIGA 10 MG TABS tablet TAKE 1 TABLET BY MOUTH DAILY  BEFORE BREAKFAST   furosemide (LASIX) 40 MG tablet TAKE 1 TABLET BY MOUTH DAILY   indomethacin (INDOCIN) 50 MG capsule TAKE 1 CAPSULE BY MOUTH 3 TIMES  DAILY AS NEEDED   pantoprazole (PROTONIX) 40 MG tablet Take 1 tablet (40 mg total) by mouth daily.   pravastatin (PRAVACHOL) 40 MG tablet Take 1 tablet (40 mg total) by mouth every evening.   predniSONE (DELTASONE) 10 MG tablet 2 tabs by mouth per day for 5 days   sacubitril-valsartan (ENTRESTO) 97-103 MG Take 1 tablet by mouth 2 (two) times daily.   spironolactone (ALDACTONE) 25 MG tablet TAKE ONE-HALF TABLET BY MOUTH  DAILY   tirzepatide (MOUNJARO) 5 MG/0.5ML Pen Inject 5 mg into the skin once a week.   vitamin B-12 (CYANOCOBALAMIN) 1000 MCG tablet Take 1 tablet (1,000 mcg total) by mouth daily.   No facility-administered encounter medications on file as of 09/27/2023.    Allergies (verified) Patient has no known allergies.   History: Past Medical History:  Diagnosis Date   ALLERGIC RHINITIS    Allergy    B12 deficiency 04/06/2020   Cervical radiculitis  CHF (congestive heart failure) (HCC)    Chronic systolic heart failure (HCC) 02/28/2015   GERD (gastroesophageal reflux disease)    GOUT    Hyperglycemia 08/28/2014   HYPERLIPIDEMIA    HYPERTENSION    Lateral epicondylitis of right elbow    Nonischemic cardiomyopathy (HCC) 09/28/2014   Obesity (BMI 30-39.9) 06/06/2015   S/P cardiac cath wjith normal coronary arteries  09/28/2014   Sleep apnea    wears CPAP   Thrombocytopenia (HCC) 04/06/2020   Past Surgical History:  Procedure Laterality Date   COLONOSCOPY     COLONOSCOPY WITH PROPOFOL N/A 06/17/2017   Procedure: COLONOSCOPY WITH PROPOFOL;  Surgeon: Rachael Fee, MD;  Location: [facility]  ENDOSCOPY;  Service: Endoscopy;  Laterality: N/A;   EP IMPLANTABLE DEVICE N/A 03/21/2015   Procedure: SubQ ICD Implant;  Surgeon: Duke Salvia, MD;  Location: [facility] INVASIVE CV LAB;  Service: Cardiovascular;  Laterality: N/A;   LEFT AND RIGHT HEART CATHETERIZATION WITH CORONARY ANGIOGRAM N/A 09/27/2014   Procedure: LEFT AND RIGHT HEART CATHETERIZATION WITH CORONARY ANGIOGRAM;  Surgeon: Lyn Records, MD;  Location: [facility] CATH LAB;  Service: Cardiovascular;  Laterality: N/A;   SUBQ ICD  03/21/2015   SUBQ ICD IMPLANT N/A 02/10/2021   Procedure: SUBQ ICD IMPLANT;  Surgeon: Duke Salvia, MD;  Location: [facility] INVASIVE CV LAB;  Service: Cardiovascular;  Laterality: N/A;   Family History  Problem Relation Age of Onset   Lung cancer Mother    Diabetes Father    Heart disease Father    Heart attack Father 66   Hypertension Sister    Aneurysm Sister 42       brain aneurysm   Healthy Brother    Healthy Brother    Healthy Brother    Colon cancer Neg Hx    Esophageal cancer Neg Hx    Rectal cancer Neg Hx    Stomach cancer Neg Hx    Social History   Socioeconomic History   Marital status: Single    Spouse name: Not on file   Number of children: Not on file   Years of education: Not on file   Highest education level: GED or equivalent  Occupational History   Occupation: retired  Tobacco Use   Smoking status: Never    Passive exposure: Never   Smokeless tobacco: Never  Vaping Use   Vaping status: Never Used  Substance and Sexual Activity   Alcohol use: No    Alcohol/week: 0.0 standard drinks of alcohol   Drug use: No   Sexual activity: Not Currently  Other Topics Concern   Not on file  Social History Narrative   Not on file   Social Drivers of Health   Financial Resource Strain: Low Risk  (09/27/2023)   Overall Financial Resource Strain (CARDIA)    Difficulty of Paying Living Expenses: Not hard at all  Food Insecurity: No Food Insecurity (09/27/2023)   Hunger Vital Sign    Worried  About Running Out of Food in the Last Year: Never true    Ran Out of Food in the Last Year: Never true  Transportation Needs: No Transportation Needs (09/27/2023)   PRAPARE - Administrator, Civil Service (Medical): No    Lack of Transportation (Non-Medical): No  Physical Activity: Sufficiently Active (09/27/2023)   Exercise Vital Sign    Days of Exercise per Week: 5 days    Minutes of Exercise per Session: 60 min  Recent Concern: Physical Activity - Insufficiently Active (09/26/2023)  Exercise Vital Sign    Days of Exercise per Week: 3 days    Minutes of Exercise per Session: 20 min  Stress: No Stress Concern Present (09/27/2023)   [employer] of Occupational Health - Occupational Stress Questionnaire    Feeling of Stress : Not at all  Social Connections: Moderately Integrated (09/27/2023)   Social Connection and Isolation Panel [NHANES]    Frequency of Communication with Friends and Family: More than three times a week    Frequency of Social Gatherings with Friends and Family: More than three times a week    Attends Religious Services: More than 4 times per year    Active Member of [employer] or Organizations: Yes    Attends Engineer, structural: More than 4 times per year    Marital Status: Never married    Tobacco Counseling Counseling given: No    Clinical Intake:  Pre-visit preparation completed: Yes  Pain : No/denies pain     BMI - recorded: 30.85 Nutritional Risks: None Diabetes: Yes CBG done?: No Did pt. bring in CBG monitor from home?: No  Lab Results  Component Value Date   HGBA1C 5.9 09/15/2023   HGBA1C 6.8 (H) 04/08/2023   HGBA1C 6.9 (H) 10/05/2022     How often do you need to have someone help you when you read instructions, pamphlets, or other written materials from your doctor or pharmacy?: 1 - Never  Interpreter Needed?: No  Information entered by :: Hassell Halim, CMA   Activities of Daily Living     09/27/2023    1:58 PM  10/15/2022    8:55 AM  In your present state of health, do you have any difficulty performing the following activities:  Hearing? 0 0  Vision? 0 0  Difficulty concentrating or making decisions? 0 0  Walking or climbing stairs? 0 0  Dressing or bathing? 0 0  Doing errands, shopping? 0 0  Preparing Food and eating ? N N  Using the Toilet? N N  In the past six months, have you accidently leaked urine? N N  Do you have problems with loss of bowel control? N N  Managing your Medications? N N  Managing your Finances? N N  Housekeeping or managing your Housekeeping? N N    Patient Care Team: Corwin Levins, MD as PCP - General (Internal Medicine) Rennis Golden Lisette Abu, MD as PCP - Cardiology (Cardiology) Quintella Reichert, MD as PCP - Sleep Medicine (Cardiology) Duke Salvia, MD as PCP - Electrophysiology (Cardiology) Conley Rolls, My [place], [place] as Referring Physician (Optometry)  Indicate any recent Medical Services you may have received from other than [facility] providers in the past year (date may be approximate).     Assessment:   This is a routine wellness examination for [place].  Hearing/Vision screen Hearing Screening - Comments:: Denies hearing difficulties   Vision Screening - Comments:: Wears rx glasses - up to date with routine eye exams with [facility]   Goals Addressed               This Visit's Progress     Patient Stated (pt-stated)        Patient stated he wants to stay active.       Depression Screen     09/27/2023    2:03 PM 04/13/2023    8:08 AM 10/15/2022    8:54 AM 10/12/2022    8:17 AM 06/10/2022   11:41 AM 03/30/2022    2:47 PM 11/03/2021  1:06 PM  PHQ 2/9 Scores  PHQ - 2 Score 0 0 0 0 0 0 0  PHQ- 9 Score 0     0 0    Fall Risk     09/27/2023    1:59 PM 04/13/2023    8:08 AM 10/15/2022    8:54 AM 10/13/2022    9:39 AM 10/12/2022    7:20 PM  Fall Risk   Falls in the past year? 0 0 0 0 0  Number falls in past yr: 0 0 0    Injury with Fall? 0 0 0 0 0   Risk for fall due to : No Fall Risks No Fall Risks No Fall Risks    Follow up Falls prevention discussed;Falls evaluation completed Falls evaluation completed Falls prevention discussed      MEDICARE RISK AT HOME:  Medicare Risk at Home Any stairs in or around the home?: No If so, are there any without handrails?: No Home free of loose throw rugs in walkways, pet beds, electrical cords, etc?: Yes Adequate lighting in your home to reduce risk of falls?: Yes Life alert?: No Use of a cane, walker or w/c?: No Grab bars in the bathroom?: Yes Shower chair or bench in shower?: No Elevated toilet seat or a handicapped toilet?: Yes  TIMED UP AND GO:  Was the test performed?  No  Cognitive Function: 6CIT completed        09/27/2023    1:59 PM 10/15/2022    8:55 AM 10/08/2021    2:45 PM 10/02/2019    8:36 AM  6CIT Screen  What Year? 0 points 0 points 0 points 0 points  What month? 0 points 0 points 0 points 0 points  What time? 0 points 0 points 0 points 0 points  Count back from 20 0 points 0 points 0 points 0 points  Months in reverse 0 points 0 points 0 points 0 points  Repeat phrase 0 points 0 points 0 points 0 points  Total Score 0 points 0 points 0 points 0 points    Immunizations Immunization History  Administered Date(s) Administered   Fluad Quad(high Dose 65+) 04/04/2020, 03/28/2021, 03/30/2022   Fluad Trivalent(High Dose 65+) 04/13/2023   Influenza Split 04/28/2012   Influenza Whole 03/22/2009, 04/18/2010   Influenza,inj,Quad PF,6+ Mos 02/25/2014, 02/28/2015, 03/29/2018, 03/09/2019   Influenza-Unspecified 02/10/2017, 03/29/2018, 03/09/2019   Moderna Sars-Covid-2 Vaccination 09/08/2019, 10/09/2019, 04/23/2020   Pneumococcal Conjugate-13 04/04/2020   Pneumococcal Polysaccharide-23 02/28/2015, 03/30/2022   Td 07/01/2009   Tdap 03/30/2022   Zoster Recombinant(Shingrix) 09/29/2016, 12/02/2016    Screening Tests Health Maintenance  Topic Date Due   OPHTHALMOLOGY EXAM   08/31/2023   INFLUENZA VACCINE  01/21/2024   HEMOGLOBIN A1C  03/17/2024   FOOT EXAM  04/12/2024   Diabetic kidney evaluation - eGFR measurement  09/14/2024   Diabetic kidney evaluation - Urine ACR  09/14/2024   Medicare Annual Wellness (AWV)  09/26/2024   Colonoscopy  07/14/2029   DTaP/Tdap/Td (3 - Td or Tdap) 03/30/2032   Pneumonia Vaccine 30+ Years old  Completed   Hepatitis C Screening  Completed   Zoster Vaccines- Shingrix  Completed   HPV VACCINES  Aged Out   COVID-19 Vaccine  Discontinued    Health Maintenance  Health Maintenance Due  Topic Date Due   OPHTHALMOLOGY EXAM  08/31/2023   Health Maintenance Items Addressed: 09/27/2023   Additional Screening:  Vision Screening: Recommended annual ophthalmology exams for early detection of glaucoma and other disorders of the  eye.  Pt stated seen Ophthalmologist in 07/2023 [facility]).  Dental Screening: Recommended annual dental exams for proper oral hygiene  Community Resource Referral / Chronic Care Management: CRR required this visit?  No   CCM required this visit?  No     Plan:     I have personally reviewed and noted the following in the patient's chart:   Medical and social history Use of alcohol, tobacco or illicit drugs  Current medications and supplements including opioid prescriptions. Patient is not currently taking opioid prescriptions. Functional ability and status Nutritional status Physical activity Advanced directives List of other physicians Hospitalizations, surgeries, and ER visits in previous 12 months Vitals Screenings to include cognitive, depression, and falls Referrals and appointments  In addition, I have reviewed and discussed with patient certain preventive protocols, quality metrics, and best practice recommendations. A written personalized care plan for preventive services as well as general preventive health recommendations were provided to patient.     Darreld Mclean,  CMA   09/27/2023   After Visit Summary: (In Person-Declined) Patient declined AVS at this time.  Notes: Nothing significant to report at this time.

## 2023-09-27 NOTE — Patient Instructions (Addendum)
 Mr. Stager , Thank you for taking time to come for your Medicare Wellness Visit. I appreciate your ongoing commitment to your health goals. Please review the following plan we discussed and let me know if I can assist you in the future.   Referrals/Orders/Follow-Ups/Clinician Recommendations: Aim for 30 minutes of exercise or brisk walking, 6-8 glasses of water, and 5 servings of fruits and vegetables each day.   This is a list of the screening recommended for you and due dates:  Health Maintenance  Topic Date Due   Eye exam for diabetics  08/31/2023   Flu Shot  01/21/2024   Hemoglobin A1C  03/17/2024   Complete foot exam   04/12/2024   Yearly kidney function blood test for diabetes  09/14/2024   Yearly kidney health urinalysis for diabetes  09/14/2024   Medicare Annual Wellness Visit  09/26/2024   Colon Cancer Screening  07/14/2029   DTaP/Tdap/Td vaccine (3 - Td or Tdap) 03/30/2032   Pneumonia Vaccine  Completed   Hepatitis C Screening  Completed   Zoster (Shingles) Vaccine  Completed   HPV Vaccine  Aged Out   COVID-19 Vaccine  Discontinued    Advanced directives: (In Chart) A copy of your advanced directives are scanned into your chart should your provider ever need it.  Next Medicare Annual Wellness Visit scheduled for next year: Yes

## 2023-09-27 NOTE — Assessment & Plan Note (Signed)
 Last vitamin D Lab Results  Component Value Date   VD25OH 49.50 09/15/2023   Stable, cont oral replacement

## 2023-09-27 NOTE — Progress Notes (Signed)
 Patient ID: Matthew Guerrero, male   DOB: [birth year], 68 y.o.   MRN: [id number]         Chief Complaint:: wellness exam and NICM , hld, dm, htn, low b12, low vit d       HPI:  Matthew Guerrero is a 68 y.o. male here for wellness exam; up to date                Also Pt denies chest pain, increased sob or doe, wheezing, orthopnea, PND, increased LE swelling, palpitations, dizziness or syncope.   Pt denies polydipsia, polyuria, or new focal neuro s/s.    Pt denies fever, wt loss, night sweats, loss of appetite, or other constitutional symptoms  Hard to lose wt, asking for increased mounjaro.     Wt Readings from Last 3 Encounters:  09/27/23 197 lb (89.4 kg)  09/27/23 197 lb (89.4 kg)  07/08/23 200 lb 9.6 oz (91 kg)   BP Readings from Last 3 Encounters:  09/27/23 120/62  09/27/23 120/62  07/08/23 110/64   Immunization History  Administered Date(s) Administered   Fluad Quad(high Dose 65+) 04/04/2020, 03/28/2021, 03/30/2022   Fluad Trivalent(High Dose 65+) 04/13/2023   Influenza Split 04/28/2012   Influenza Whole 03/22/2009, 04/18/2010   Influenza,inj,Quad PF,6+ Mos 02/25/2014, 02/28/2015, 03/29/2018, 03/09/2019   Influenza-Unspecified 02/10/2017, 03/29/2018, 03/09/2019   Moderna Sars-Covid-2 Vaccination 09/08/2019, 10/09/2019, 04/23/2020   Pneumococcal Conjugate-13 04/04/2020   Pneumococcal Polysaccharide-23 02/28/2015, 03/30/2022   Td 07/01/2009   Tdap 03/30/2022   Zoster Recombinant(Shingrix) 09/29/2016, 12/02/2016   There are no preventive care reminders to display for this patient.     Past Medical History:  Diagnosis Date   ALLERGIC RHINITIS    Allergy    B12 deficiency 04/06/2020   Cervical radiculitis    CHF (congestive heart failure) (HCC)    Chronic systolic heart failure (HCC) 02/28/2015   GERD (gastroesophageal reflux disease)    GOUT    Hyperglycemia 08/28/2014   HYPERLIPIDEMIA    HYPERTENSION    Lateral epicondylitis of right elbow    Nonischemic  cardiomyopathy (HCC) 09/28/2014   Obesity (BMI 30-39.9) 06/06/2015   S/P cardiac cath wjith normal coronary arteries  09/28/2014   Sleep apnea    wears CPAP   Thrombocytopenia (HCC) 04/06/2020   Past Surgical History:  Procedure Laterality Date   COLONOSCOPY     COLONOSCOPY WITH PROPOFOL N/A 06/17/2017   Procedure: COLONOSCOPY WITH PROPOFOL;  Surgeon: Rachael Fee, MD;  Location: [facility] ENDOSCOPY;  Service: Endoscopy;  Laterality: N/A;   EP IMPLANTABLE DEVICE N/A 03/21/2015   Procedure: SubQ ICD Implant;  Surgeon: Duke Salvia, MD;  Location: [facility] INVASIVE CV LAB;  Service: Cardiovascular;  Laterality: N/A;   LEFT AND RIGHT HEART CATHETERIZATION WITH CORONARY ANGIOGRAM N/A 09/27/2014   Procedure: LEFT AND RIGHT HEART CATHETERIZATION WITH CORONARY ANGIOGRAM;  Surgeon: Lyn Records, MD;  Location: [facility] CATH LAB;  Service: Cardiovascular;  Laterality: N/A;   SUBQ ICD  03/21/2015   SUBQ ICD IMPLANT N/A 02/10/2021   Procedure: SUBQ ICD IMPLANT;  Surgeon: Duke Salvia, MD;  Location[facility] INVASIVE CV LAB;  Service: Cardiovascular;  Laterality: N/A;    reports that he has never smoked. He has never been exposed to tobacco smoke. He has never used smokeless tobacco. He reports that he does not drink alcohol and does not use drugs. family history includes Aneurysm (age of onset: 30) in his sister; Diabetes in his father; Healthy in his brother, brother, and brother; Heart attack (age  of onset: 59) in his father; Heart disease in his father; Hypertension in his sister; Lung cancer in his mother. No Known Allergies Current Outpatient Medications on File Prior to Visit  Medication Sig Dispense Refill   aspirin EC 81 MG tablet Take 81 mg by mouth daily. Swallow whole.     azelastine (OPTIVAR) 0.05 % ophthalmic solution Place 1 drop into both eyes 2 (two) times daily. 6 mL 12   furosemide (LASIX) 40 MG tablet TAKE 1 TABLET BY MOUTH DAILY 90 tablet 3   indomethacin (INDOCIN) 50 MG capsule TAKE 1 CAPSULE BY  MOUTH 3 TIMES  DAILY AS NEEDED 300 capsule 2   sacubitril-valsartan (ENTRESTO) 97-103 MG Take 1 tablet by mouth 2 (two) times daily. 200 tablet 0   spironolactone (ALDACTONE) 25 MG tablet TAKE ONE-HALF TABLET BY MOUTH  DAILY 50 tablet 2   vitamin B-12 (CYANOCOBALAMIN) 1000 MCG tablet Take 1 tablet (1,000 mcg total) by mouth daily. 90 tablet 1   No current facility-administered medications on file prior to visit.        ROS:  All others reviewed and negative.  Objective        PE:  BP 120/62 (BP Location: Left Arm, Patient Position: Sitting, Cuff Size: Normal)   Pulse 90   Temp 97.8 F (36.6 C) (Oral)   Ht 5\' 7"  (1.702 m)   Wt 197 lb (89.4 kg)   BMI 30.85 kg/m                 Constitutional: Pt appears in NAD               HENT: Head: NCAT.                Right Ear: External ear normal.                 Left Ear: External ear normal.                Eyes: . Pupils are equal, round, and reactive to light. Conjunctivae and EOM are normal               Nose: without d/c or deformity               Neck: Neck supple. Gross normal ROM               Cardiovascular: Normal rate and regular rhythm.                 Pulmonary/Chest: Effort normal and breath sounds without rales or wheezing.                Abd:  Soft, NT, ND, + BS, no organomegaly               Neurological: Pt is alert. At baseline orientation, motor grossly intact               Skin: Skin is warm. No rashes, no other new lesions, LE edema - none               Psychiatric: Pt behavior is normal without agitation   Micro: none  Cardiac tracings I have personally interpreted today:  none  Pertinent Radiological findings (summarize): none   Lab Results  Component Value Date   WBC 9.2 09/15/2023   HGB 14.9 09/15/2023   HCT 46.3 09/15/2023   PLT 149.0 (L) 09/15/2023   GLUCOSE 88 09/15/2023   CHOL 118 09/15/2023   TRIG 154.0 (  H) 09/15/2023   HDL 36.40 (L) 09/15/2023   LDLDIRECT 73.0 04/01/2021   LDLCALC 51 09/15/2023    ALT 21 09/15/2023   AST 18 09/15/2023   NA 136 09/15/2023   K 3.9 09/15/2023   CL 100 09/15/2023   CREATININE 1.42 09/15/2023   BUN 15 09/15/2023   CO2 29 09/15/2023   TSH 2.05 09/15/2023   PSA 0.98 09/15/2023   INR 1.2 (H) 03/15/2015   HGBA1C 5.9 09/15/2023   MICROALBUR 0.0 09/15/2023   Assessment/Plan:  Matthew Guerrero is a 68 y.o. Black or African American [2] male with  has a past medical history of ALLERGIC RHINITIS, Allergy, B12 deficiency (04/06/2020), Cervical radiculitis, CHF (congestive heart failure) (HCC), Chronic systolic heart failure (HCC) (04/54/0981), GERD (gastroesophageal reflux disease), GOUT, Hyperglycemia (08/28/2014), HYPERLIPIDEMIA, HYPERTENSION, Lateral epicondylitis of right elbow, Nonischemic cardiomyopathy (HCC) (09/28/2014), Obesity (BMI 30-39.9) (06/06/2015), S/P cardiac cath wjith normal coronary arteries  (09/28/2014), Sleep apnea, and Thrombocytopenia (HCC) (04/06/2020).  Encounter for well adult exam with abnormal findings Age and sex appropriate education and counseling updated with regular exercise and diet Referrals for preventative services - none needed Immunizations addressed - none needed Smoking counseling  - none needed Evidence for depression or other mood disorder - none significant Most recent labs reviewed. I have personally reviewed and have noted: 1) the patient's medical and social history 2) The patient's current medications and supplements 3) The patient's height, weight, and BMI have been recorded in the chart   NICM (nonischemic cardiomyopathy) (HCC) Stable volume, cont current med tx  Essential hypertension BP Readings from Last 3 Encounters:  09/27/23 120/62  09/27/23 120/62  07/08/23 110/64   Stable, pt to continue medical treatment coreg 25 bid   Hyperlipidemia Lab Results  Component Value Date   LDLCALC 51 09/15/2023   Stable, pt to continue current statin pravachol 40 qd   Diabetes (HCC) Lab Results   Component Value Date   HGBA1C 5.9 09/15/2023   With uncontrolled obesity, ptfor increased mounjaro 7.5 mg weekly   B12 deficiency Lab Results  Component Value Date   VITAMINB12 322 09/15/2023   Stable, cont oral replacement - b12 1000 mcg qd   Vitamin D deficiency Last vitamin D Lab Results  Component Value Date   VD25OH 49.50 09/15/2023   Stable, cont oral replacement   CKD (chronic kidney disease) stage 3, GFR 30-59 ml/min (HCC) Lab Results  Component Value Date   CREATININE 1.42 09/15/2023   Stable overall, cont to avoid nephrotoxins  Followup: Return in about 6 months (around 03/28/2024).  Oliver Barre, MD 09/27/2023 8:43 PM [facility] [place] [facility]

## 2023-09-27 NOTE — Assessment & Plan Note (Signed)
 Lab Results  Component Value Date   HGBA1C 5.9 09/15/2023   With uncontrolled obesity, ptfor increased mounjaro 7.5 mg weekly

## 2023-09-27 NOTE — Assessment & Plan Note (Signed)
 Lab Results  Component Value Date   LDLCALC 51 09/15/2023   Stable, pt to continue current statin pravachol 40 qd

## 2023-09-27 NOTE — Assessment & Plan Note (Signed)
Stable volume, cont current med tx 

## 2023-10-18 ENCOUNTER — Ambulatory Visit: Payer: 59 | Admitting: Internal Medicine

## 2023-10-19 ENCOUNTER — Telehealth: Payer: Self-pay

## 2023-10-19 NOTE — Telephone Encounter (Signed)
 Patient returned call. Device clinic apt made 10/20/23 @ 3:30. Joey from [employer] made aware. Patient advised on new location for location, date and time.

## 2023-10-19 NOTE — Telephone Encounter (Signed)
 Alert received from [employer] for :  Untreated episode 4/26 @ 18:50.  EGM c/w oversensing of P and T waves.  Attempted to contact patient for device clinic apt for reprogramming. No answer, LMTCB.  Would like to see if pt can come to device clinic 10/20/23 @ 9:30 am. If patient returns call and able to make apt, contact Joey with [employer] to advise in need of assistance.

## 2023-10-20 ENCOUNTER — Ambulatory Visit: Payer: 59 | Admitting: Internal Medicine

## 2023-10-20 ENCOUNTER — Ambulatory Visit: Attending: Cardiovascular Disease

## 2023-10-20 DIAGNOSIS — I428 Other cardiomyopathies: Secondary | ICD-10-CM

## 2023-10-20 LAB — CUP PACEART INCLINIC DEVICE CHECK
Date Time Interrogation Session: 20250430143009
Implantable Lead Connection Status: 753985
Implantable Lead Implant Date: 20160929
Implantable Lead Location: 753862
Implantable Lead Model: 3401
Implantable Pulse Generator Implant Date: 20220822
Pulse Gen Serial Number: 166274

## 2023-10-20 NOTE — Progress Notes (Signed)
 Subcutaneous ICD check in clinic. 0 untreated episodes; 0 treated episodes; 0 shocks delivered. Electrode impedance status okay. Remaining longevity to ERI %.  VT Zone programmed from 220 bpm to 230 bpm by Joey from [employer].  Reviewing previous notes.  Alternate: Oversensing/inappropriate shock.  Primary-Oversensing Secondary: Oversensing.   Per Joey from [employer], recommends leaving device programming the same.  No changes made to session.

## 2023-10-24 NOTE — Progress Notes (Unsigned)
 Cardiology Office Note:    Date:  10/25/2023   ID:  Matthew Guerrero, DOB February 18, 1956, MRN [id number]  PCP:  Roslyn Coombe, MD   [facility] Providers Cardiologist:  Hazle Lites, MD Electrophysiologist:  Richardo Chandler, MD  Sleep Medicine:  Gaylyn Keas, MD     Referring MD: Roslyn Coombe, MD   Chief Complaint  Patient presents with   Follow-up    NICM    History of Present Illness:    Matthew Guerrero is a 68 y.o. male with a hx of OSA on CPAP, hypertension, hyperlipidemia, nonischemic cardiomyopathy, S-ICD, gout, and asthma.  Echo in 2016 with LVEF 15-20%.  Pt had normal coronaries on heart cath 2016. Most recent echo 2023 showed LVEF 35-40%. Subcutaneous ICD placed 2022.     He recently had his device re-adjusted 10/19/23 for ICD shock. Other than that, he is doing well from a cardiac perspective. BP well controlled. He is on mounjaro he has lost weight (unclear how much).  By chart review, has likely lost about 20 lbs. BMP in 08/2023 stable, although we need to watch creatinine.     Past Medical History:  Diagnosis Date   ALLERGIC RHINITIS    Allergy    B12 deficiency 04/06/2020   Cervical radiculitis    CHF (congestive heart failure) (HCC)    Chronic systolic heart failure (HCC) 02/28/2015   GERD (gastroesophageal reflux disease)    GOUT    Hyperglycemia 08/28/2014   HYPERLIPIDEMIA    HYPERTENSION    Lateral epicondylitis of right elbow    Nonischemic cardiomyopathy (HCC) 09/28/2014   Obesity (BMI 30-39.9) 06/06/2015   S/P cardiac cath wjith normal coronary arteries  09/28/2014   Sleep apnea    wears CPAP   Thrombocytopenia (HCC) 04/06/2020    Past Surgical History:  Procedure Laterality Date   COLONOSCOPY     COLONOSCOPY WITH PROPOFOL  N/A 06/17/2017   Procedure: COLONOSCOPY WITH PROPOFOL ;  Surgeon: Janel Medford, MD;  Location: [facility] ENDOSCOPY;  Service: Endoscopy;  Laterality: N/A;   EP IMPLANTABLE DEVICE N/A 03/21/2015   Procedure: SubQ ICD  Implant;  Surgeon: Verona Goodwill, MD;  Location: [facility] INVASIVE CV LAB;  Service: Cardiovascular;  Laterality: N/A;   LEFT AND RIGHT HEART CATHETERIZATION WITH CORONARY ANGIOGRAM N/A 09/27/2014   Procedure: LEFT AND RIGHT HEART CATHETERIZATION WITH CORONARY ANGIOGRAM;  Surgeon: Arty Binning, MD;  Location: [facility] CATH LAB;  Service: Cardiovascular;  Laterality: N/A;   SUBQ ICD  03/21/2015   SUBQ ICD IMPLANT N/A 02/10/2021   Procedure: SUBQ ICD IMPLANT;  Surgeon: Verona Goodwill, MD;  Location: [facility] INVASIVE CV LAB;  Service: Cardiovascular;  Laterality: N/A;    Current Medications: Current Meds  Medication Sig   allopurinol  (ZYLOPRIM ) 300 MG tablet Take 1 tablet (300 mg total) by mouth daily.   aspirin  EC 81 MG tablet Take 81 mg by mouth daily. Swallow whole.   azelastine  (OPTIVAR ) 0.05 % ophthalmic solution Place 1 drop into both eyes 2 (two) times daily.   carvedilol  (COREG ) 25 MG tablet Take 1 tablet (25 mg total) by mouth 2 (two) times daily with a meal.   dapagliflozin  propanediol (FARXIGA ) 10 MG TABS tablet Take 1 tablet (10 mg total) by mouth daily before breakfast.   furosemide  (LASIX ) 40 MG tablet TAKE 1 TABLET BY MOUTH DAILY   indomethacin  (INDOCIN ) 50 MG capsule TAKE 1 CAPSULE BY MOUTH 3 TIMES  DAILY AS NEEDED   pantoprazole  (PROTONIX ) 40 MG tablet Take 1  tablet (40 mg total) by mouth daily.   pravastatin  (PRAVACHOL ) 40 MG tablet Take 1 tablet (40 mg total) by mouth every evening.   sacubitril -valsartan  (ENTRESTO ) 97-103 MG Take 1 tablet by mouth 2 (two) times daily.   spironolactone  (ALDACTONE ) 25 MG tablet TAKE ONE-HALF TABLET BY MOUTH  DAILY   tirzepatide (MOUNJARO) 7.5 MG/0.5ML Pen Inject 7.5 mg into the skin once a week.   vitamin B-12 (CYANOCOBALAMIN ) 1000 MCG tablet Take 1 tablet (1,000 mcg total) by mouth daily.     Allergies:   Patient has no known allergies.   Social History   Socioeconomic History   Marital status: Single    Spouse name: Not on file   Number of children:  Not on file   Years of education: Not on file   Highest education level: GED or equivalent  Occupational History   Occupation: retired  Tobacco Use   Smoking status: Never    Passive exposure: Never   Smokeless tobacco: Never  Vaping Use   Vaping status: Never Used  Substance and Sexual Activity   Alcohol use: No    Alcohol/week: 0.0 standard drinks of alcohol   Drug use: No   Sexual activity: Not Currently  Other Topics Concern   Not on file  Social History Narrative   Not on file   Social Drivers of Health   Financial Resource Strain: Low Risk  (09/27/2023)   Overall Financial Resource Strain (CARDIA)    Difficulty of Paying Living Expenses: Not hard at all  Food Insecurity: No Food Insecurity (09/27/2023)   Hunger Vital Sign    Worried About Running Out of Food in the Last Year: Never true    Ran Out of Food in the Last Year: Never true  Transportation Needs: No Transportation Needs (09/27/2023)   PRAPARE - Administrator, Civil Service (Medical): No    Lack of Transportation (Non-Medical): No  Physical Activity: Sufficiently Active (09/27/2023)   Exercise Vital Sign    Days of Exercise per Week: 5 days    Minutes of Exercise per Session: 60 min  Recent Concern: Physical Activity - Insufficiently Active (09/26/2023)   Exercise Vital Sign    Days of Exercise per Week: 3 days    Minutes of Exercise per Session: 20 min  Stress: No Stress Concern Present (09/27/2023)   [employer] of Occupational Health - Occupational Stress Questionnaire    Feeling of Stress : Not at all  Social Connections: Moderately Integrated (09/27/2023)   Social Connection and Isolation Panel [NHANES]    Frequency of Communication with Friends and Family: More than three times a week    Frequency of Social Gatherings with Friends and Family: More than three times a week    Attends Religious Services: More than 4 times per year    Active Member of [employer] or Organizations: Yes    Attends Probation officer: More than 4 times per year    Marital Status: Never married     Family History: The patient's family history includes Aneurysm (age of onset: 21) in his sister; Diabetes in his father; Healthy in his brother, brother, and brother; Heart attack (age of onset: 1) in his father; Heart disease in his father; Hypertension in his sister; Lung cancer in his mother. There is no history of Colon cancer, Esophageal cancer, Rectal cancer, or Stomach cancer.  ROS:   Please see the history of present illness.     All other systems reviewed and  are negative.  EKGs/Labs/Other Studies Reviewed:    The following studies were reviewed today:       Recent Labs: 09/15/2023: ALT 21; BUN 15; Creatinine, Ser 1.42; Hemoglobin 14.9; Platelets 149.0; Potassium 3.9; Sodium 136; TSH 2.05  Recent Lipid Panel    Component Value Date/Time   CHOL 118 09/15/2023 1539   CHOL 134 10/21/2016 1438   TRIG 154.0 (H) 09/15/2023 1539   HDL 36.40 (L) 09/15/2023 1539   HDL 35 (L) 10/21/2016 1438   CHOLHDL 3 09/15/2023 1539   VLDL 30.8 09/15/2023 1539   LDLCALC 51 09/15/2023 1539   LDLCALC 68 10/21/2016 1438   LDLDIRECT 73.0 04/01/2021 0747     Risk Assessment/Calculations:                Physical Exam:    VS:  BP 99/67 (BP Location: Left Arm, Patient Position: Sitting, Cuff Size: Normal)   Pulse 88   Ht 5\' 10"  (1.778 m)   Wt 197 lb (89.4 kg)   SpO2 95%   BMI 28.27 kg/m     Wt Readings from Last 3 Encounters:  10/25/23 197 lb (89.4 kg)  09/27/23 197 lb (89.4 kg)  09/27/23 197 lb (89.4 kg)     GEN:  Well nourished, well developed in no acute distress HEENT: Normal NECK: No JVD; No carotid bruits LYMPHATICS: No lymphadenopathy CARDIAC: RRR, no murmurs, rubs, gallops RESPIRATORY:  Clear to auscultation without rales, wheezing or rhonchi  ABDOMEN: Soft, non-tender, non-distended MUSCULOSKELETAL:  No edema; No deformity  SKIN: Warm and dry NEUROLOGIC:  Alert and oriented x  3 PSYCHIATRIC:  Normal affect   ASSESSMENT:    1. NICM (nonischemic cardiomyopathy) (HCC)   2. Chronic systolic heart failure (HCC)   3. ICD (implantable cardioverter-defibrillator) in place   4. Primary hypertension   5. OSA (obstructive sleep apnea)   6. Hyperlipidemia with target LDL less than 70    PLAN:    In order of problems listed above:  NICM Chronic systolic heart failure - LVEF as low as 15-20% in 2016, most recently 35-40% - mildly reduced RV - GDMT with coreg , entresto , spironolactone , farxiga , and lasix  - BMP 08/2023 with sCr 1.42 and K 3.9   Hypertension - managed in the context of CHF - BP borderline today, but he is asymptomatic   Hyperlipidemia with LDL goal < 70 09/15/2023: Cholesterol 118; HDL 36.40; LDL Cholesterol 51; Triglycerides 154.0; VLDL 30.8 Continue ASA, lipitor   OSA on CPAP - compliant   DM - last A1c 5.9% - on GLP-1   Follow up 1 year.            Medication Adjustments/Labs and Tests Ordered: Current medicines are reviewed at length with the patient today.  Concerns regarding medicines are outlined above.  No orders of the defined types were placed in this encounter.  No orders of the defined types were placed in this encounter.   There are no Patient Instructions on file for this visit.   Signed, Lamond Pilot, PA  10/25/2023 4:09 PM    [facility]

## 2023-10-25 ENCOUNTER — Ambulatory Visit: Attending: Physician Assistant | Admitting: Physician Assistant

## 2023-10-25 ENCOUNTER — Encounter: Payer: Self-pay | Admitting: Physician Assistant

## 2023-10-25 VITALS — BP 99/67 | HR 88 | Ht 70.0 in | Wt 197.0 lb

## 2023-10-25 DIAGNOSIS — Z95810 Presence of automatic (implantable) cardiac defibrillator: Secondary | ICD-10-CM

## 2023-10-25 DIAGNOSIS — G4733 Obstructive sleep apnea (adult) (pediatric): Secondary | ICD-10-CM | POA: Diagnosis not present

## 2023-10-25 DIAGNOSIS — E785 Hyperlipidemia, unspecified: Secondary | ICD-10-CM

## 2023-10-25 DIAGNOSIS — I428 Other cardiomyopathies: Secondary | ICD-10-CM

## 2023-10-25 DIAGNOSIS — I10 Essential (primary) hypertension: Secondary | ICD-10-CM | POA: Diagnosis not present

## 2023-10-25 DIAGNOSIS — I5022 Chronic systolic (congestive) heart failure: Secondary | ICD-10-CM

## 2023-10-25 MED ORDER — SPIRONOLACTONE 25 MG PO TABS: 12.5000 mg | ORAL_TABLET | Freq: Every day | ORAL | 2 refills | Status: DC

## 2023-10-25 MED ORDER — CARVEDILOL 25 MG PO TABS: 25.0000 mg | ORAL_TABLET | Freq: Two times a day (BID) | ORAL | 3 refills | Status: DC

## 2023-10-25 MED ORDER — FUROSEMIDE 40 MG PO TABS: 40.0000 mg | ORAL_TABLET | Freq: Every day | ORAL | 3 refills | Status: DC

## 2023-10-25 MED ORDER — DAPAGLIFLOZIN PROPANEDIOL 10 MG PO TABS: 10.0000 mg | ORAL_TABLET | Freq: Every day | ORAL | 3 refills | Status: DC

## 2023-10-25 MED ORDER — PRAVASTATIN SODIUM 40 MG PO TABS: 40.0000 mg | ORAL_TABLET | Freq: Every evening | ORAL | 3 refills | Status: DC

## 2023-10-25 MED ORDER — TIRZEPATIDE 7.5 MG/0.5ML ~~LOC~~ SOAJ: 7.5000 mg | SUBCUTANEOUS | 3 refills | Status: DC

## 2023-10-25 MED ORDER — ENTRESTO 97-103 MG PO TABS: 1.0000 | ORAL_TABLET | Freq: Two times a day (BID) | ORAL | 0 refills | Status: DC

## 2023-10-25 MED ORDER — ASPIRIN EC 81 MG PO TBEC: 81.0000 mg | DELAYED_RELEASE_TABLET | Freq: Every day | ORAL | 3 refills | Status: DC

## 2023-10-25 NOTE — Patient Instructions (Signed)
 Medication Instructions:  NO CHANGES *If you need a refill on your cardiac medications before your next appointment, please call your pharmacy*  Lab Work: NO LABS If you have labs (blood work) drawn today and your tests are completely normal, you will receive your results only by: MyChart Message (if you have MyChart) OR A paper copy in the mail If you have any lab test that is abnormal or we need to change your treatment, we will call you to review the results.  Testing/Procedures: NO TESTING  Follow-Up: At [facility], you and your health needs are our priority.  As part of our continuing mission to provide you with exceptional heart care, our providers are all part of one team.  This team includes your primary Cardiologist (physician) and Advanced Practice Providers or APPs (Physician Assistants and Nurse Practitioners) who all work together to provide you with the care you need, when you need it.  Your next appointment:   1 year(s)  Provider:   Hazle Lites, MD

## 2023-10-25 NOTE — Addendum Note (Signed)
 Addended by: Danna Duster T on: 10/25/2023 04:18 PM   Modules accepted: Orders

## 2023-11-01 ENCOUNTER — Encounter: Payer: Self-pay | Admitting: Cardiovascular Disease

## 2023-11-08 ENCOUNTER — Encounter: Payer: Self-pay | Admitting: Internal Medicine

## 2023-11-08 DIAGNOSIS — G4733 Obstructive sleep apnea (adult) (pediatric): Secondary | ICD-10-CM | POA: Diagnosis not present

## 2023-11-08 MED ORDER — HYDROCODONE BIT-HOMATROP MBR 5-1.5 MG/5ML PO SOLN
5.0000 mL | Freq: Four times a day (QID) | ORAL | 0 refills | Status: AC | PRN
Start: 2023-11-08 — End: 2023-11-18

## 2023-11-09 ENCOUNTER — Encounter: Payer: Self-pay | Admitting: Internal Medicine

## 2023-11-09 ENCOUNTER — Ambulatory Visit (INDEPENDENT_AMBULATORY_CARE_PROVIDER_SITE_OTHER): Payer: Medicare Other

## 2023-11-09 DIAGNOSIS — I428 Other cardiomyopathies: Secondary | ICD-10-CM

## 2023-11-09 LAB — CUP PACEART REMOTE DEVICE CHECK
Battery Remaining Percentage: 69 %
Date Time Interrogation Session: 20250520070300
HighPow Impedance: 85 Ohm
Implantable Lead Connection Status: 753985
Implantable Lead Implant Date: 20160929
Implantable Lead Location: 753862
Implantable Lead Model: 3401
Implantable Pulse Generator Implant Date: 20220822
Pulse Gen Serial Number: 166274

## 2023-11-09 MED ORDER — PROMETHAZINE-DM 6.25-15 MG/5ML PO SYRP: 5.0000 mL | ORAL_SOLUTION | Freq: Four times a day (QID) | ORAL | 0 refills | Status: DC | PRN

## 2023-11-18 ENCOUNTER — Ambulatory Visit: Payer: Self-pay | Admitting: Cardiovascular Disease

## 2023-11-29 DIAGNOSIS — G4733 Obstructive sleep apnea (adult) (pediatric): Secondary | ICD-10-CM | POA: Diagnosis not present

## 2023-12-06 ENCOUNTER — Ambulatory Visit (HOSPITAL_COMMUNITY)
Admission: RE | Admit: 2023-12-06 | Discharge: 2023-12-06 | Disposition: A | Discharge status: Home / Self Care | Source: Ambulatory Visit | Attending: Cardiology

## 2023-12-06 ENCOUNTER — Telehealth: Payer: Self-pay

## 2023-12-06 ENCOUNTER — Ambulatory Visit: Attending: Cardiovascular Disease

## 2023-12-06 DIAGNOSIS — I428 Other cardiomyopathies: Secondary | ICD-10-CM | POA: Diagnosis not present

## 2023-12-06 DIAGNOSIS — Z95810 Presence of automatic (implantable) cardiac defibrillator: Secondary | ICD-10-CM | POA: Diagnosis not present

## 2023-12-06 DIAGNOSIS — Z452 Encounter for adjustment and management of vascular access device: Secondary | ICD-10-CM | POA: Diagnosis not present

## 2023-12-06 NOTE — Telephone Encounter (Addendum)
 Alert received from [employer]:  Alert remote transmission: SICD shock Tachy therapy event on 6/15 at 2330.  Event lasted 1 min 2 sec.  T wave farfield sensing leading to ICD shock x 2.  First shock did appear to induce a true tachy with continued oversensing.  Sending to triage.  Call received from Pt reporting a shock.  Per review of transmission shock appears to be inappropriate d/t oversensing.  Reviewed with [employer] rep.  No alternative vectors available for Pt.  Recommends turning off HV therapies and obtaining Xray.  Advises to discuss with following physician.  Advised Pt would call back upon reviewing with physician.

## 2023-12-06 NOTE — Telephone Encounter (Signed)
 Discussed with Dr. Arlester Ladd.  Will have Pt come in today to disable HV therapies and obtain Xray.  Per Dr. Jaynee Meyer appointment to discuss current device removal.

## 2023-12-06 NOTE — Progress Notes (Unsigned)
 Pt seen in clinic d/t inappropriate shock from [facility] S-ICD. Therapies turned off.

## 2023-12-06 NOTE — Patient Instructions (Addendum)
 You will get a chest X-ray to evaluate lead positioning for your defibrillator.  You will have a follow up appointment with Dr. Arlester Ladd:  December 13, 2023 at 9:00 am

## 2023-12-07 ENCOUNTER — Ambulatory Visit: Payer: Self-pay | Admitting: Cardiovascular Disease

## 2023-12-12 NOTE — Progress Notes (Unsigned)
 Electrophysiology Office Note:    Date:  12/13/2023   ID:  Matthew Guerrero, DOB [birth year], MRN [id number]  PCP:  Norleen Lynwood ORN, MD   [facility] Providers Cardiologist:  Vinie JAYSON Maxcy, MD Electrophysiologist:  Elspeth Sage, MD  Sleep Medicine:  Wilbert Bihari, MD     Referring MD: Norleen Lynwood ORN, MD   History of Present Illness:    Matthew Guerrero is a 68 y.o. male with a medical history significant for nonischemic cardiomyopathy with [facility] [employer] subcutaneous ICD, obstructive sleep apnea on CPAP, hypertension, who presents for electrophysiology follow-up.       Discussed the use of AI scribe software for clinical note transcription with the patient, who gave verbal consent to proceed.  History of Present Illness Dekota Guerrero is a 68 year old male with non-ischemic cardiomyopathy who presents with inappropriate shocks from his ICD.  He has a history of non-ischemic cardiomyopathy and had a Environmental manager subcutaneous ICD placed by Dr. Sage in 2016 due nonischemic cardiomyopathy. In 2022, he underwent a generator change for the ICD. Within the past several months, T wave oversensing has been noticed on his device, so he is programmed from the secondary vector to the alternate vector.  Recently, he has been experiencing inappropriate shocks from his ICD, describing the sensation as feeling like 'getting hit by a truck.' No pain or discomfort from the ICD otherwise.  The ICD is currently on advisory for premature battery depletion.          Today, he reports that he is doing well and has no complaints.  EKGs/Labs/Other Studies Reviewed Today:     Echocardiogram:  TEE April 2023 LVEF 35 to 40%.  Grade 1 diastolic dysfunction.  Hypermobile septum without PFO.  Degenerative mitral valve with trivial mitral regurgitation   EKG:         Physical Exam:    VS:  BP 108/64   Pulse 98   Ht 5' 10 (1.778 m)   Wt 190 lb 6.4 oz (86.4 kg)   SpO2 96%    BMI 27.32 kg/m     Wt Readings from Last 3 Encounters:  12/13/23 190 lb 6.4 oz (86.4 kg)  10/25/23 197 lb (89.4 kg)  09/27/23 197 lb (89.4 kg)     GEN:  Well nourished, well developed in no acute distress CARDIAC: RRR, no murmurs, rubs, gallops RESPIRATORY:  Normal work of breathing MUSCULOSKELETAL: no edema    ASSESSMENT & PLAN:     Inappropriate ICD shocks He has had T wave oversensing in all vectors and has received inappropriate shocks Device is currently programmed off Will plan to explant the device due to unmitigatable inappropriate shocks We discussed options for ongoing management of the device which would involve either remaining the device turned off or explant.  Using a shared decision making approach, we decided to proceed with explant.  We discussed the indication and rationale for device explant.  I explained risks including discomfort, infection, bleeding.  He would require general anesthesia for the procedure.  He acknowledged understanding these risks and would like to proceed  Nonischemic cardiomyopathy EF is now greater than 35% He has not had any VT events detected by his device requiring therapy since device placement 9 years ago At this point, he would not meet criteria for placement of a new device We will continue to monitor.  If his EF drops below 35%, we will need to reconsider placement of a Denovo transvenous defibrillator  Signed, Eulas FORBES Furbish, MD  12/13/2023 9:23 AM    [facility]

## 2023-12-13 ENCOUNTER — Ambulatory Visit: Attending: Cardiovascular Disease | Admitting: Cardiovascular Disease

## 2023-12-13 ENCOUNTER — Encounter: Payer: Self-pay | Admitting: Cardiovascular Disease

## 2023-12-13 VITALS — BP 108/64 | HR 98 | Ht 70.0 in | Wt 190.4 lb

## 2023-12-13 DIAGNOSIS — I428 Other cardiomyopathies: Secondary | ICD-10-CM

## 2023-12-13 DIAGNOSIS — I5022 Chronic systolic (congestive) heart failure: Secondary | ICD-10-CM

## 2023-12-13 NOTE — Patient Instructions (Signed)
 Medication Instructions:  Your physician recommends that you continue on your current medications as directed. Please refer to the Current Medication list given to you today.  *If you need a refill on your cardiac medications before your next appointment, please call your pharmacy*  Lab Work: CBC and BMET - please have pre-procedure lab work completed on Tuesday, July 15 (a couple days after that is fine as well) This can be completed at ANY [facility] near you, no appointment is required and this does NOT have to be fastin If you have labs (blood work) drawn today and your tests are completely normal, you will receive your results only by: MyChart Message (if you have MyChart) OR A paper copy in the mail If you have any lab test that is abnormal or we need to change your treatment, we will call you to review the results.  Testing/Procedures: SubQ ICD Extraction - scheduled for Thursday, August 14   Follow-Up: At [facility], you and your health needs are our priority.  As part of our continuing mission to provide you with exceptional heart care, our providers are all part of one team.  This team includes your primary Cardiologist (physician) and Advanced Practice Providers or APPs (Physician Assistants and Nurse Practitioners) who all work together to provide you with the care you need, when you need it.  Your next appointment:   We will schedule follow up for you after your extraction   Provider:   Eulas Furbish, MD

## 2023-12-31 NOTE — Progress Notes (Signed)
 Remote ICD transmission.

## 2024-01-04 ENCOUNTER — Encounter: Payer: Self-pay | Admitting: Internal Medicine

## 2024-01-04 DIAGNOSIS — I428 Other cardiomyopathies: Secondary | ICD-10-CM

## 2024-01-04 DIAGNOSIS — I5022 Chronic systolic (congestive) heart failure: Secondary | ICD-10-CM | POA: Diagnosis not present

## 2024-01-04 MED ORDER — SACUBITRIL-VALSARTAN 97-103 MG PO TABS: 1.0000 | ORAL_TABLET | Freq: Two times a day (BID) | ORAL | 3 refills | Status: AC

## 2024-01-05 ENCOUNTER — Ambulatory Visit: Payer: Self-pay | Admitting: Cardiovascular Disease

## 2024-01-05 LAB — CBC
Hematocrit: 49.2 % (ref 37.5–51.0)
Hemoglobin: 16 g/dL (ref 13.0–17.7)
MCH: 28.5 pg (ref 26.6–33.0)
MCHC: 32.5 g/dL (ref 31.5–35.7)
MCV: 88 fL (ref 79–97)
Platelets: 163 x10E3/uL (ref 150–450)
RBC: 5.61 x10E6/uL (ref 4.14–5.80)
RDW: 13.9 % (ref 11.6–15.4)
WBC: 7.8 x10E3/uL (ref 3.4–10.8)

## 2024-01-05 LAB — BASIC METABOLIC PANEL WITH GFR
BUN/Creatinine Ratio: 16 (ref 10–24)
BUN: 31 mg/dL — ABNORMAL HIGH (ref 8–27)
CO2: 17 mmol/L — ABNORMAL LOW (ref 20–29)
Calcium: 9.6 mg/dL (ref 8.6–10.2)
Chloride: 95 mmol/L — ABNORMAL LOW (ref 96–106)
Creatinine, Ser: 1.96 mg/dL — ABNORMAL HIGH (ref 0.76–1.27)
Glucose: 107 mg/dL — ABNORMAL HIGH (ref 70–99)
Potassium: 5.1 mmol/L (ref 3.5–5.2)
Sodium: 133 mmol/L — ABNORMAL LOW (ref 134–144)
eGFR: 36 mL/min/1.73 — ABNORMAL LOW (ref >59)

## 2024-01-07 ENCOUNTER — Other Ambulatory Visit: Payer: Self-pay | Admitting: Internal Medicine

## 2024-01-12 ENCOUNTER — Encounter: Payer: Self-pay | Admitting: Cardiology

## 2024-01-14 ENCOUNTER — Telehealth: Payer: Self-pay | Admitting: Cardiology

## 2024-01-14 NOTE — Telephone Encounter (Signed)
 Pt asked that you call him back. Did not wish to say what it was about.

## 2024-01-17 NOTE — Telephone Encounter (Signed)
 Patient is requesting to speak with Brad Molt. Please advise.

## 2024-01-25 NOTE — Telephone Encounter (Signed)
 Returned patients call. Patient was grateful for the call and thanked me.

## 2024-01-27 ENCOUNTER — Telehealth (HOSPITAL_COMMUNITY): Payer: Self-pay

## 2024-01-27 NOTE — Telephone Encounter (Signed)
 Patient returned call to discuss upcoming procedure.   Confirmed patient is scheduled for a Subcutaneous ICD Removal, Subcutaneous Lead Removal on Thursday, August 14 with Dr. Eulas Furbish. Instructed patient to arrive at the Main Entrance A at [facility]: [address] [place], [place] [address] and check in at Admitting at 10:30 AM.   Labs completed  Any recent signs of acute illness or been started on antibiotics? No Any new medications started? No Any medications to hold?  Hold Mounjaro  for 1 week prior to the procedure- last dose on August 03. Hold Aspirin  for 7 days prior to your procedure- last dose on August 6. Hold Farxiga  for 3 days prior to the procedure- last dose on August 10. Medication instructions:  On the morning of your procedure DO NOT take any medication. No eating or drinking after midnight prior to procedure.   The night before your procedure and the morning of your procedure, wash thoroughly with the CHG surgical soap from the neck down, paying special attention to the area where your procedure will be performed.  Advised of plan to stay overnight. You MUST have a responsible adult to drive you home the following day.   Patient verbalized understanding to all instructions provided and agreed to proceed with procedure.

## 2024-01-27 NOTE — Telephone Encounter (Signed)
 Pt is returning call to a nurse

## 2024-01-27 NOTE — Telephone Encounter (Signed)
 Attempted to reach patient to discuss upcoming procedure, no answer. Left VM for patient to return call.

## 2024-02-03 ENCOUNTER — Ambulatory Visit (HOSPITAL_COMMUNITY)

## 2024-02-03 ENCOUNTER — Other Ambulatory Visit: Payer: Self-pay

## 2024-02-03 ENCOUNTER — Ambulatory Visit (HOSPITAL_BASED_OUTPATIENT_CLINIC_OR_DEPARTMENT_OTHER)

## 2024-02-03 ENCOUNTER — Encounter (HOSPITAL_COMMUNITY)
Admission: RE | Disposition: A | Discharge status: Home / Self Care | Payer: Self-pay | Source: Home / Self Care | Attending: Cardiovascular Disease

## 2024-02-03 ENCOUNTER — Ambulatory Visit (HOSPITAL_COMMUNITY)
Admission: RE | Disposition: A | Discharge status: Home / Self Care | Payer: Self-pay | Source: Home / Self Care | Attending: Cardiovascular Disease

## 2024-02-03 ENCOUNTER — Ambulatory Visit (HOSPITAL_COMMUNITY)
Admission: RE | Admit: 2024-02-03 | Discharge: 2024-02-04 | Disposition: A | Discharge status: Home / Self Care | Attending: Cardiovascular Disease | Admitting: Cardiovascular Disease

## 2024-02-03 DIAGNOSIS — N183 Chronic kidney disease, stage 3 unspecified: Secondary | ICD-10-CM | POA: Diagnosis not present

## 2024-02-03 DIAGNOSIS — I428 Other cardiomyopathies: Secondary | ICD-10-CM | POA: Diagnosis not present

## 2024-02-03 DIAGNOSIS — G4733 Obstructive sleep apnea (adult) (pediatric): Secondary | ICD-10-CM | POA: Insufficient documentation

## 2024-02-03 DIAGNOSIS — T82118A Breakdown (mechanical) of other cardiac electronic device, initial encounter: Secondary | ICD-10-CM | POA: Diagnosis not present

## 2024-02-03 DIAGNOSIS — T82191A Other mechanical complication of cardiac pulse generator (battery), initial encounter: Secondary | ICD-10-CM | POA: Diagnosis not present

## 2024-02-03 DIAGNOSIS — I10 Essential (primary) hypertension: Secondary | ICD-10-CM | POA: Diagnosis not present

## 2024-02-03 DIAGNOSIS — I130 Hypertensive heart and chronic kidney disease with heart failure and stage 1 through stage 4 chronic kidney disease, or unspecified chronic kidney disease: Secondary | ICD-10-CM

## 2024-02-03 DIAGNOSIS — Y718 Miscellaneous cardiovascular devices associated with adverse incidents, not elsewhere classified: Secondary | ICD-10-CM | POA: Insufficient documentation

## 2024-02-03 DIAGNOSIS — I5022 Chronic systolic (congestive) heart failure: Secondary | ICD-10-CM | POA: Diagnosis not present

## 2024-02-03 DIAGNOSIS — T82111A Breakdown (mechanical) of cardiac pulse generator (battery), initial encounter: Secondary | ICD-10-CM

## 2024-02-03 DIAGNOSIS — E1122 Type 2 diabetes mellitus with diabetic chronic kidney disease: Secondary | ICD-10-CM | POA: Diagnosis not present

## 2024-02-03 DIAGNOSIS — T82198A Other mechanical complication of other cardiac electronic device, initial encounter: Secondary | ICD-10-CM | POA: Diagnosis not present

## 2024-02-03 HISTORY — PX: SUBQ DEVICE REMOVAL: EP1271

## 2024-02-03 HISTORY — PX: SUBQ LEAD REMOVAL: EP1272

## 2024-02-03 SURGERY — SUBQ DEVICE REMOVAL
Anesthesia: General

## 2024-02-03 MED ORDER — FENTANYL CITRATE (PF) 100 MCG/2ML IJ SOLN
INTRAMUSCULAR | Status: AC
  Filled 2024-02-03: qty 2

## 2024-02-03 MED ORDER — LIDOCAINE 2% (20 MG/ML) 5 ML SYRINGE
INTRAMUSCULAR | Status: DC | PRN
  Administered 2024-02-03: 40 mg via INTRAVENOUS

## 2024-02-03 MED ORDER — PHENYLEPHRINE HCL-NACL 20-0.9 MG/250ML-% IV SOLN
INTRAVENOUS | Status: DC | PRN
  Administered 2024-02-03 (×2): 80 ug via INTRAVENOUS
  Administered 2024-02-03: 160 ug via INTRAVENOUS

## 2024-02-03 MED ORDER — DEXAMETHASONE SODIUM PHOSPHATE 10 MG/ML IJ SOLN
INTRAMUSCULAR | Status: DC | PRN
  Administered 2024-02-03: 10 mg via INTRAVENOUS

## 2024-02-03 MED ORDER — PROPOFOL 500 MG/50ML IV EMUL
INTRAVENOUS | Status: DC | PRN
  Administered 2024-02-03: 100 mg via INTRAVENOUS
  Administered 2024-02-03: 50 ug/kg/min via INTRAVENOUS
  Administered 2024-02-03: 40 mg via INTRAVENOUS

## 2024-02-03 MED ORDER — POVIDONE-IODINE 10 % EX SWAB: 2.0000 | Freq: Once | CUTANEOUS | Status: DC

## 2024-02-03 MED ORDER — ACETAMINOPHEN 325 MG PO TABS: 325.0000 mg | ORAL_TABLET | ORAL | Status: DC | PRN

## 2024-02-03 MED ORDER — CEFAZOLIN SODIUM-DEXTROSE 2-4 GM/100ML-% IV SOLN
2.0000 g | INTRAVENOUS | Status: AC
  Administered 2024-02-03: 2 g via INTRAVENOUS
  Filled 2024-02-03: qty 100

## 2024-02-03 MED ORDER — BUPIVACAINE HCL (PF) 0.25 % IJ SOLN
INTRAMUSCULAR | Status: DC | PRN
  Administered 2024-02-03: 40 mL

## 2024-02-03 MED ORDER — SODIUM CHLORIDE 0.9 % IV SOLN: INTRAVENOUS | Status: DC

## 2024-02-03 MED ORDER — SUGAMMADEX SODIUM 200 MG/2ML IV SOLN
INTRAVENOUS | Status: DC | PRN
  Administered 2024-02-03: 200 mg via INTRAVENOUS

## 2024-02-03 MED ORDER — CHLORHEXIDINE GLUCONATE 4 % EX SOLN: 4.0000 | Freq: Once | CUTANEOUS | Status: DC

## 2024-02-03 MED ORDER — BUPIVACAINE HCL (PF) 0.25 % IJ SOLN
INTRAMUSCULAR | Status: AC
  Filled 2024-02-03: qty 60

## 2024-02-03 MED ORDER — ROCURONIUM BROMIDE 10 MG/ML (PF) SYRINGE
PREFILLED_SYRINGE | INTRAVENOUS | Status: DC | PRN
  Administered 2024-02-03: 30 mg via INTRAVENOUS
  Administered 2024-02-03: 70 mg via INTRAVENOUS

## 2024-02-03 MED ORDER — ONDANSETRON HCL 4 MG/2ML IJ SOLN
INTRAMUSCULAR | Status: DC | PRN
  Administered 2024-02-03: 4 mg via INTRAVENOUS

## 2024-02-03 MED ORDER — SODIUM CHLORIDE 0.9 % IV SOLN: 80.0000 mg | INTRAVENOUS | Status: DC

## 2024-02-03 MED ORDER — FENTANYL CITRATE (PF) 250 MCG/5ML IJ SOLN
INTRAMUSCULAR | Status: DC | PRN
  Administered 2024-02-03 (×2): 50 ug via INTRAVENOUS

## 2024-02-03 MED ORDER — ONDANSETRON HCL 4 MG/2ML IJ SOLN: 4.0000 mg | Freq: Four times a day (QID) | INTRAMUSCULAR | Status: DC | PRN

## 2024-02-03 MED ORDER — SODIUM CHLORIDE 0.9 % IV SOLN
INTRAVENOUS | Status: AC
  Filled 2024-02-03: qty 2

## 2024-02-03 MED ORDER — PROPOFOL 1000 MG/100ML IV EMUL
INTRAVENOUS | Status: AC
  Filled 2024-02-03: qty 100

## 2024-02-03 NOTE — Transfer of Care (Signed)
 Immediate Anesthesia Transfer of Care Note  Patient: Matthew Guerrero  Procedure(s) Performed: SUBQ DEVICE REMOVAL SUBQ LEAD REMOVAL  Patient Location: PACU and Cath Lab  Anesthesia Type:General  Level of Consciousness: awake, alert , and oriented  Airway & Oxygen Therapy: Patient Spontanous Breathing and Patient connected to nasal cannula oxygen  Post-op Assessment: Report given to RN and Post -op Vital signs reviewed and stable  Post vital signs: stable  Last Vitals:  Vitals Value Taken Time  BP    Temp    Pulse    Resp    SpO2      Last Pain:  Vitals:   02/03/24 0934  TempSrc: Oral         Complications: There were no known notable events for this encounter.

## 2024-02-03 NOTE — H&P (Signed)
 Electrophysiology Office Note:    Date:  02/03/2024   ID:  Matthew Guerrero, DOB 03-08-1956, MRN [id number]  PCP:  Norleen Lynwood ORN, MD   [facility] Providers Cardiologist:  Vinie JAYSON Maxcy, MD Electrophysiologist:  Eulas FORBES Furbish, MD  Sleep Medicine:  Wilbert Bihari, MD     Referring MD: No ref. provider found   History of Present Illness:    Matthew Guerrero is a 68 y.o. male with a medical history significant for nonischemic cardiomyopathy with [employer] subcutaneous ICD, obstructive sleep apnea on CPAP, hypertension, who presents for electrophysiology follow-up.       Discussed the use of AI scribe software for clinical note transcription with the patient, who gave verbal consent to proceed.  History of Present Illness Matthew Guerrero is a 68 year old male with non-ischemic cardiomyopathy who presents with inappropriate shocks from his ICD.  He has a history of non-ischemic cardiomyopathy and had a Environmental manager subcutaneous ICD placed by Dr. Fernande in 2016 due nonischemic cardiomyopathy. In 2022, he underwent a generator change for the ICD. Within the past several months, T wave oversensing has been noticed on his device, so he is programmed from the secondary vector to the alternate vector.  Recently, he has been experiencing inappropriate shocks from his ICD, describing the sensation as feeling like 'getting hit by a truck.' No pain or discomfort from the ICD otherwise.  The ICD is currently on advisory for premature battery depletion.          Today, he reports that he is doing well and has no complaints. There have been no changes since our prior visit. He would like to proceed with the S-Icd explant and would prefer this to leaving it in place with the device turned off.  EKGs/Labs/Other Studies Reviewed Today:     Echocardiogram:  TEE April 2023 LVEF 35 to 40%.  Grade 1 diastolic dysfunction.  Hypermobile septum without PFO.  Degenerative  mitral valve with trivial mitral regurgitation   EKG:         Physical Exam:    VS:  BP 122/83   Pulse 72   Temp 98.1 F (36.7 C) (Oral)   Resp 16   Ht 5' 10 (1.778 m)   Wt 77.1 kg   SpO2 100%   BMI 24.39 kg/m     Wt Readings from Last 3 Encounters:  02/03/24 77.1 kg  12/13/23 86.4 kg  10/25/23 89.4 kg     GEN:  Well nourished, well developed in no acute distress CARDIAC: RRR, no murmurs, rubs, gallops RESPIRATORY:  Normal work of breathing MUSCULOSKELETAL: no edema    ASSESSMENT & PLAN:     Inappropriate ICD shocks He has had T wave oversensing in all vectors and has received inappropriate shocks Device is currently programmed off Will plan to explant the device due to unmitigatable inappropriate shocks We discussed options for ongoing management of the device which would involve either remaining the device turned off or explant.  Using a shared decision making approach, we decided to proceed with explant.  We discussed the indication and rationale for device explant.  I explained risks including discomfort, infection, bleeding.  He would require general anesthesia for the procedure.  He acknowledged understanding these risks and would like to proceed  Nonischemic cardiomyopathy EF is now greater than 35% He has not had any VT events detected by his device requiring therapy since device placement 9 years ago At this point, he would not meet criteria  for placement of a new device We will continue to monitor.  If his EF drops below 35%, we will need to reconsider placement of a Denovo transvenous defibrillator      Signed, Eulas FORBES Furbish, MD  02/03/2024 11:52 AM    [facility]

## 2024-02-03 NOTE — Anesthesia Procedure Notes (Signed)
 Procedure Name: Intubation Date/Time: 02/03/2024 1:07 PM  Performed by: Moishe Reyes CROME, CRNAPre-anesthesia Checklist: Patient identified, Emergency Drugs available, Suction available, Patient being monitored and Timeout performed Patient Re-evaluated:Patient Re-evaluated prior to induction Oxygen Delivery Method: Circle system utilized Preoxygenation: Pre-oxygenation with 100% oxygen Induction Type: IV induction Ventilation: Mask ventilation without difficulty Laryngoscope Size: McGrath and 3 Grade View: Grade I Tube size: 7.0 mm Number of attempts: 1 Airway Equipment and Method: Stylet and Video-laryngoscopy Placement Confirmation: ETT inserted through vocal cords under direct vision, positive ETCO2, breath sounds checked- equal and bilateral and CO2 detector Secured at: 22 cm Tube secured with: Tape Dental Injury: Teeth and Oropharynx as per pre-operative assessment

## 2024-02-03 NOTE — Anesthesia Preprocedure Evaluation (Signed)
 Anesthesia Evaluation  Patient identified by MRN, date of birth, ID band Patient awake    Reviewed: Allergy & Precautions, NPO status , Patient's Chart, lab work & pertinent test results  Airway Mallampati: II  TM Distance: >3 FB Neck ROM: Full    Dental no notable dental hx.    Pulmonary sleep apnea    Pulmonary exam normal        Cardiovascular hypertension, +CHF   Rhythm:Regular Rate:Normal     Neuro/Psych negative neurological ROS     GI/Hepatic Neg liver ROS,GERD  ,,  Endo/Other  diabetes    Renal/GU   negative genitourinary   Musculoskeletal  (+) Arthritis , Osteoarthritis,    Abdominal Normal abdominal exam  (+)   Peds  Hematology   Anesthesia Other Findings   Reproductive/Obstetrics                              Anesthesia Physical Anesthesia Plan  ASA: 3  Anesthesia Plan: General   Post-op Pain Management:    Induction: Intravenous  PONV Risk Score and Plan: 2 and Ondansetron , Dexamethasone  and Treatment may vary due to age or medical condition  Airway Management Planned: Mask and Oral ETT  Additional Equipment: None  Intra-op Plan:   Post-operative Plan: Extubation in OR  Informed Consent: I have reviewed the patients History and Physical, chart, labs and discussed the procedure including the risks, benefits and alternatives for the proposed anesthesia with the patient or authorized representative who has indicated his/her understanding and acceptance.     Dental advisory given  Plan Discussed with: CRNA  Anesthesia Plan Comments:         Anesthesia Quick Evaluation

## 2024-02-03 NOTE — Progress Notes (Signed)
 Patient class changed to Ambulatory from Surgery Admit per MD Mealor.

## 2024-02-04 ENCOUNTER — Other Ambulatory Visit (HOSPITAL_COMMUNITY): Payer: Self-pay

## 2024-02-04 ENCOUNTER — Encounter (HOSPITAL_COMMUNITY): Payer: Self-pay | Admitting: Cardiovascular Disease

## 2024-02-04 DIAGNOSIS — G4733 Obstructive sleep apnea (adult) (pediatric): Secondary | ICD-10-CM | POA: Diagnosis not present

## 2024-02-04 DIAGNOSIS — I428 Other cardiomyopathies: Secondary | ICD-10-CM | POA: Diagnosis not present

## 2024-02-04 DIAGNOSIS — T82191A Other mechanical complication of cardiac pulse generator (battery), initial encounter: Secondary | ICD-10-CM | POA: Diagnosis not present

## 2024-02-04 DIAGNOSIS — I10 Essential (primary) hypertension: Secondary | ICD-10-CM | POA: Diagnosis not present

## 2024-02-04 MED FILL — Fentanyl Citrate Preservative Free (PF) Inj 100 MCG/2ML: INTRAMUSCULAR | Qty: 2 | Status: AC

## 2024-02-04 MED FILL — Propofol IV Emul 1000 MG/100ML (10 MG/ML): INTRAVENOUS | Qty: 80.23 | Status: AC

## 2024-02-04 NOTE — Progress Notes (Signed)
 AVS and education given to pt.  Pt expressed verbal understanding of care plan.  Denies pain or discomfort.    Family at bedside.   Tele removed unit called.  PIV removed.  Dressings intact Wound dressings CDI and OTA with steri strips.    Discharge completed.  Waiting for transportation.  Discharge Lounge called for pickup.

## 2024-02-04 NOTE — Discharge Instructions (Signed)
 Implantable Cardiac Device Extraction, Care After  This sheet gives you information about how to care for yourself after your procedure. Your health care provider may also give you more specific instructions. If you have problems or questions, contact your health care provider.  What can I expect after the procedure? After your procedure, it is common to have: Pain or soreness at the site where the cardiac device was removed. Mild Swelling at the site where the cardiac device was inserted.  Follow these instructions at home: Incision care  Keep the incision clean and dry. Do not take baths, swim, or use a hot tub until after your wound check.  Do not shower for at least 7 days, or as directed by your health care provider. Pat the area dry with a clean towel. Do not rub the area. This may cause bleeding. Follow instructions from your health care provider about how to take care of your incision. Make sure you: Leave stitches (sutures), skin glue, or adhesive strips in place. These skin closures may need to stay in place for 2 weeks or longer. If adhesive strip edges start to loosen and curl up, you may trim the loose edges. Do not remove adhesive strips completely unless your health care provider tells you to do that. Check around your incision area every day for signs of infection. Check for: More redness, swelling, or pain. More fluid or blood. Warmth. Pus or a bad smell. Activity Do not lift anything that is heavier than 10 lb (4.5 kg) until your health care provider says it is okay to do so. For the first week, or as long as told by your health care provider: Avoid lifting your affected arm higher than your shoulder. Avoid strenuous exercise. Ask your health care provider when it is okay to: Resume your normal activities. Return to work or school. Resume sexual activity. Contact a health care provider if: You have any of these around your incision site or coming from it: More  redness, swelling, or pain. Fluid or blood. Warmth to the touch. Pus or a bad smell. You have a fever. Get help right away if: You experience chest pain that is different from the pain at the cardiac device site. You develop a red streak that extends above or below the incision site. You experience shortness of breath. You have light-headedness that does not go away quickly. You faint or have dizzy spells. Your pulse suddenly drops or increases rapidly and does not return to normal. You begin to gain weight and your legs and ankles swell. Summary After your procedure, it is common to have pain, soreness, and some swelling where the cardiac device was removed. Make sure to keep your incision clean and dry. Follow instructions from your health care provider about how to take care of your incision. Check your incision every day for signs of infection, such as more pain or swelling, pus or a bad smell, warmth, or leaking fluid and blood. Avoid strenuous exercise and lifting your left arm higher than your shoulder for 2 weeks, or as long as told by your health care provider. This information is not intended to replace advice given to you by your health care provider. Make sure you discuss any questions you have with your health care provider.

## 2024-02-04 NOTE — Anesthesia Postprocedure Evaluation (Signed)
 Anesthesia Post Note  Patient: Coal Nearhood  Procedure(s) Performed: SUBQ DEVICE REMOVAL SUBQ LEAD REMOVAL     Patient location during evaluation: PACU Anesthesia Type: General Level of consciousness: awake and alert Pain management: pain level controlled Vital Signs Assessment: post-procedure vital signs reviewed and stable Respiratory status: spontaneous breathing, nonlabored ventilation, respiratory function stable and patient connected to nasal cannula oxygen Cardiovascular status: blood pressure returned to baseline and stable Postop Assessment: no apparent nausea or vomiting Anesthetic complications: no   There were no known notable events for this encounter.  Last Vitals:  Vitals:   02/04/24 0311 02/04/24 0748  BP: 122/67   Pulse: (!) 109   Resp: 18 18  Temp:  36.8 C  SpO2: 98%     Last Pain:  Vitals:   02/04/24 0748  TempSrc: Oral  PainSc:                  Cordella SQUIBB Jesslyn Viglione

## 2024-02-04 NOTE — Discharge Summary (Signed)
 ELECTROPHYSIOLOGY PROCEDURE DISCHARGE SUMMARY    Patient ID: Matthew Guerrero,  MRN: [id number], DOB/AGE: 1955-10-30 68 y.o.  Admit date: 02/03/2024 Discharge date: 02/04/2024  Primary Care Physician: Norleen Lynwood ORN, MD  Primary Cardiologist: Vinie JAYSON Maxcy, MD  Electrophysiologist: Dr. Nancey    Primary Diagnosis:  Inappropriate ICD Shocks  Secondary Diagnosis: Nonischemic cardiomyopathy  No Known Allergies   Procedures This Admission:  Extraction of a [employer] subcutaneous ICD      Brief HPI: Matthew Guerrero is a 68 y.o. male was referred to electrophysiology in the outpatient setting for extraction of ICD due to inappropriate ICD shocks.  Past medical history includes above. Risks, benefits, and alternatives to ICD extraction were reviewed with the patient who wished to proceed.   Hospital Course:  The patient was admitted and extraction 02/03/24. Given hx no appropriate shocks since implant as well as recovered EF >35%, shared decision made to defer re-implant. Left chest was without hematoma or ecchymosis. Wound care and restrictions were reviewed with the patient.  The patient was examined and considered stable for discharge to home.   The patient's discharge medications include an ACE-I/ARB/ARNI (Entresto ) and beta blocker (Coreg ).   Anticoagulation resumption This patient is not on anticoagulation.  Physical Exam: Vitals:   02/03/24 1919 02/04/24 0045 02/04/24 0311 02/04/24 0748  BP: 134/83 124/67 122/67   Pulse: 83 (!) 111 (!) 109   Resp: 16 18 18 18   Temp: 97.6 F (36.4 C) 98.1 F (36.7 C)  98.3 F (36.8 C)  TempSrc: Oral Oral  Oral  SpO2: 98% 99% 98%   Weight:      Height:        GEN- NAD. A&O x 3.  HEENT: Normocephalic, atraumatic Lungs- CTAB, normal effort.  Heart- RRR. No M/G/R.  GI- Soft, NT, ND.  Extremities- No clubbing, cyanosis, or edema Skin- Warm and dry, no rash or lesion. Left chest wall with clean/dry/intact  steristrips. Wound edges well approximated with no bleeding or hematoma noted.   Discharge Medications:  Allergies as of 02/04/2024   No Known Allergies      Medication List     PAUSE taking these medications    aspirin  EC 81 MG tablet Wait to take this until: February 08, 2024 Take 1 tablet (81 mg total) by mouth daily. Swallow whole.       TAKE these medications    allopurinol  300 MG tablet Commonly known as: ZYLOPRIM  Take 1 tablet (300 mg total) by mouth daily.   azelastine  0.05 % ophthalmic solution Commonly known as: OPTIVAR  Place 1 drop into both eyes 2 (two) times daily.   carvedilol  25 MG tablet Commonly known as: COREG  Take 1 tablet (25 mg total) by mouth 2 (two) times daily with a meal.   cyanocobalamin  1000 MCG tablet Commonly known as: VITAMIN B12 Take 1 tablet (1,000 mcg total) by mouth daily. What changed: when to take this   dapagliflozin  propanediol 10 MG Tabs tablet Commonly known as: Farxiga  Take 1 tablet (10 mg total) by mouth daily before breakfast.   furosemide  40 MG tablet Commonly known as: LASIX  Take 1 tablet (40 mg total) by mouth daily.   indomethacin  50 MG capsule Commonly known as: INDOCIN  TAKE 1 CAPSULE BY MOUTH 3 TIMES  DAILY AS NEEDED   pantoprazole  40 MG tablet Commonly known as: PROTONIX  Take 1 tablet (40 mg total) by mouth daily.   pravastatin  40 MG tablet Commonly known as: PRAVACHOL  Take 1 tablet (40 mg total)  by mouth every evening.   sacubitril -valsartan  97-103 MG Commonly known as: Entresto  Take 1 tablet by mouth 2 (two) times daily.   spironolactone  25 MG tablet Commonly known as: ALDACTONE  Take 0.5 tablets (12.5 mg total) by mouth daily.   tirzepatide  7.5 MG/0.5ML Pen Commonly known as: MOUNJARO  Inject 7.5 mg into the skin once a week.   VITAMIN D  PO Take 1 tablet by mouth daily.        Disposition: Home with usual follow up as in AVS  Duration of Discharge Encounter:  APP time: 33  minutes  Signed, Artist Pouch, PA-C  02/04/2024 10:12 AM

## 2024-02-06 ENCOUNTER — Encounter: Payer: Self-pay | Admitting: Internal Medicine

## 2024-02-07 ENCOUNTER — Encounter (HOSPITAL_COMMUNITY): Payer: Self-pay | Admitting: Cardiovascular Disease

## 2024-02-07 MED ORDER — HYDROCODONE BIT-HOMATROP MBR 5-1.5 MG/5ML PO SOLN: 5.0000 mL | Freq: Four times a day (QID) | ORAL | 0 refills | Status: AC | PRN

## 2024-02-07 MED ORDER — AZITHROMYCIN 250 MG PO TABS: ORAL_TABLET | ORAL | 1 refills | Status: AC

## 2024-02-14 MED ORDER — SILDENAFIL CITRATE 100 MG PO TABS: 50.0000 mg | ORAL_TABLET | Freq: Every day | ORAL | 11 refills | Status: DC | PRN

## 2024-02-14 NOTE — Addendum Note (Signed)
 Addended by: NORLEEN LYNWOOD ORN on: 02/14/2024 10:48 AM   Modules accepted: Orders

## 2024-02-17 ENCOUNTER — Ambulatory Visit: Attending: Internal Medicine | Admitting: *Deleted

## 2024-02-17 DIAGNOSIS — G4733 Obstructive sleep apnea (adult) (pediatric): Secondary | ICD-10-CM | POA: Diagnosis not present

## 2024-02-17 DIAGNOSIS — I428 Other cardiomyopathies: Secondary | ICD-10-CM

## 2024-02-17 NOTE — Progress Notes (Signed)
 Patient seen in clinic today for wound check s/p SubQ ICD explantation on 02/03/24.  Steri-strips removed. Sites well healed. No signs of bleeding or infection present at either incision. Patient given instructions for post-op wound care and signs and symptoms to watch for post-op. ED precautions reviewed. Patient verbalized understanding of all instructions/precautions provided to him by this RN. No charge for visit.

## 2024-02-17 NOTE — Patient Instructions (Signed)
   After Your ICD (Implantable Cardiac Defibrillator)    Monitor your site for redness, swelling, and drainage. Call the device clinic at [phone] if you experience these symptoms or fever/chills.  Your incision was closed with Steri-strips or staples:  You may shower 7 days after your procedure and wash your incision with soap and water. Avoid lotions, ointments, or perfumes over your incision until it is well-healed.  You may use a hot tub or a pool after your wound check appointment if the incision is completely closed.  There are no other restrictions in arm movement after your wound check appointment.

## 2024-03-14 ENCOUNTER — Other Ambulatory Visit: Payer: Self-pay

## 2024-03-14 MED ORDER — TIRZEPATIDE 7.5 MG/0.5ML ~~LOC~~ SOAJ: 7.5000 mg | SUBCUTANEOUS | 3 refills | Status: AC

## 2024-03-27 ENCOUNTER — Other Ambulatory Visit

## 2024-03-27 DIAGNOSIS — E1165 Type 2 diabetes mellitus with hyperglycemia: Secondary | ICD-10-CM

## 2024-03-27 LAB — LIPID PANEL
Cholesterol: 114 mg/dL (ref 0–200)
HDL: 34.7 mg/dL — ABNORMAL LOW (ref >39.00)
LDL Cholesterol: 52 mg/dL (ref 0–99)
NonHDL: 79.32
Total CHOL/HDL Ratio: 3
Triglycerides: 139 mg/dL (ref 0.0–149.0)
VLDL: 27.8 mg/dL (ref 0.0–40.0)

## 2024-03-27 LAB — BASIC METABOLIC PANEL WITH GFR
BUN: 13 mg/dL (ref 6–23)
CO2: 26 meq/L (ref 19–32)
Calcium: 9.5 mg/dL (ref 8.4–10.5)
Chloride: 104 meq/L (ref 96–112)
Creatinine, Ser: 1.27 mg/dL (ref 0.40–1.50)
GFR: 57.62 mL/min — ABNORMAL LOW (ref >60.00)
Glucose, Bld: 98 mg/dL (ref 70–99)
Potassium: 3.8 meq/L (ref 3.5–5.1)
Sodium: 142 meq/L (ref 135–145)

## 2024-03-27 LAB — HEPATIC FUNCTION PANEL
ALT: 14 U/L (ref 0–53)
AST: 16 U/L (ref 0–37)
Albumin: 4.5 g/dL (ref 3.5–5.2)
Alkaline Phosphatase: 48 U/L (ref 39–117)
Bilirubin, Direct: 0.1 mg/dL (ref 0.0–0.3)
Total Bilirubin: 0.6 mg/dL (ref 0.2–1.2)
Total Protein: 7 g/dL (ref 6.0–8.3)

## 2024-03-27 LAB — HEMOGLOBIN A1C: Hgb A1c MFr Bld: 5.5 % (ref 4.6–6.5)

## 2024-03-28 ENCOUNTER — Ambulatory Visit: Admitting: Internal Medicine

## 2024-04-05 ENCOUNTER — Ambulatory Visit: Admitting: Internal Medicine

## 2024-04-05 ENCOUNTER — Encounter: Payer: Self-pay | Admitting: Internal Medicine

## 2024-04-05 VITALS — BP 118/66 | HR 80 | Temp 98.7°F | Ht 70.0 in | Wt 178.0 lb

## 2024-04-05 DIAGNOSIS — E1165 Type 2 diabetes mellitus with hyperglycemia: Secondary | ICD-10-CM

## 2024-04-05 DIAGNOSIS — Z7984 Long term (current) use of oral hypoglycemic drugs: Secondary | ICD-10-CM

## 2024-04-05 DIAGNOSIS — Z23 Encounter for immunization: Secondary | ICD-10-CM

## 2024-04-05 DIAGNOSIS — N1831 Chronic kidney disease, stage 3a: Secondary | ICD-10-CM | POA: Diagnosis not present

## 2024-04-05 DIAGNOSIS — E1122 Type 2 diabetes mellitus with diabetic chronic kidney disease: Secondary | ICD-10-CM | POA: Diagnosis not present

## 2024-04-05 DIAGNOSIS — E538 Deficiency of other specified B group vitamins: Secondary | ICD-10-CM | POA: Diagnosis not present

## 2024-04-05 DIAGNOSIS — I10 Essential (primary) hypertension: Secondary | ICD-10-CM | POA: Diagnosis not present

## 2024-04-05 DIAGNOSIS — Z7985 Long-term (current) use of injectable non-insulin antidiabetic drugs: Secondary | ICD-10-CM

## 2024-04-05 DIAGNOSIS — E559 Vitamin D deficiency, unspecified: Secondary | ICD-10-CM | POA: Diagnosis not present

## 2024-04-05 DIAGNOSIS — E7800 Pure hypercholesterolemia, unspecified: Secondary | ICD-10-CM | POA: Diagnosis not present

## 2024-04-05 DIAGNOSIS — Z125 Encounter for screening for malignant neoplasm of prostate: Secondary | ICD-10-CM

## 2024-04-05 NOTE — Assessment & Plan Note (Signed)
 Ckd3a Lab Results  Component Value Date   CREATININE 1.27 03/27/2024   Stable overall, cont to avoid nephrotoxins

## 2024-04-05 NOTE — Assessment & Plan Note (Signed)
 Lab Results  Component Value Date   HGBA1C 5.5 03/27/2024  With hyperglycemia Stable, pt to continue current medical treatment farxiga  10 every day, mounjaro  7.5 mg weekly Lab Results  Component Value Date   CREATININE 1.27 03/27/2024   Stable overall, cont to avoid nephrotoxins

## 2024-04-05 NOTE — Assessment & Plan Note (Signed)
 Last vitamin D Lab Results  Component Value Date   VD25OH 49.50 09/15/2023   Stable, cont oral replacement

## 2024-04-05 NOTE — Progress Notes (Signed)
 Patient ID: Matthew Guerrero, male   DOB: 1956/05/14, 68 y.o.   MRN: [id number]        Chief Complaint: follow up low b12, low vit d, ckd 3a, dm, htn, hld       HPI:  Matthew Guerrero is a 68 y.o. male here overall doing well,  Pt denies chest pain, increased sob or doe, wheezing, orthopnea, PND, increased LE swelling, palpitations, dizziness or syncope.   Pt denies polydipsia, polyuria, or new focal neuro s/s.    Pt denies fever, wt loss, night sweats, loss of appetite, or other constitutional symptoms  Due for flu shot today       Wt Readings from Last 3 Encounters:  04/05/24 178 lb (80.7 kg)  02/03/24 181 lb 8 oz (82.3 kg)  12/13/23 190 lb 6.4 oz (86.4 kg)   BP Readings from Last 3 Encounters:  04/05/24 118/66  02/04/24 137/71  12/13/23 108/64         Past Medical History:  Diagnosis Date   ALLERGIC RHINITIS    Allergy    B12 deficiency 04/06/2020   Cervical radiculitis    CHF (congestive heart failure) (HCC)    Chronic systolic heart failure (HCC) 02/28/2015   GERD (gastroesophageal reflux disease)    GOUT    Hyperglycemia 08/28/2014   HYPERLIPIDEMIA    HYPERTENSION    Lateral epicondylitis of right elbow    Nonischemic cardiomyopathy (HCC) 09/28/2014   Obesity (BMI 30-39.9) 06/06/2015   S/P cardiac cath wjith normal coronary arteries  09/28/2014   Sleep apnea    wears CPAP   Thrombocytopenia 04/06/2020   Past Surgical History:  Procedure Laterality Date   COLONOSCOPY     COLONOSCOPY WITH PROPOFOL  N/A 06/17/2017   Procedure: COLONOSCOPY WITH PROPOFOL ;  Surgeon: Teressa Toribio SQUIBB, MD;  Location: [facility] END[facility]COPY;  Service: Endoscopy;  Laterality: N/A;   EP IMPLANTABLE DEVICE N/A 03/21/2015   Procedure: SubQ ICD Implant;  Surgeon: Elspeth JAYSON Sage, MD;  Location: [facility] INVASIVE CV LAB;  Service: Cardiovascular;  Laterality: N/A;   LEFT AND RIGHT HEART CATHETERIZATION WITH CORONARY ANGIOGRAM N/A 09/27/2014   Procedure: LEFT AND RIGHT HEART CATHETERIZATION WITH CORONARY ANGIOGRAM;   Surgeon: Victory LELON Sharps, MD;  Location: [facility] CATH LAB;  Service: Cardiovascular;  Laterality: N/A;   SUBQ DEVICE REMOVAL N/A 02/03/2024   Procedure: SUBQ DEVICE REMOVAL;  Surgeon: Nancey Eulas BRAVO, MD;  Location: [facility] INVASIVE CV LAB;  Service: Cardiovascular;  Laterality: N/A;   SUBQ ICD  03/21/2015   SUBQ ICD IMPLANT N/A 02/10/2021   Procedure: SUBQ ICD IMPLANT;  Surgeon: Sage Elspeth JAYSON, MD;  Location: [facility] INVASIVE CV LAB;  Service: Cardiovascular;  Laterality: N/A;   SUBQ LEAD REMOVAL N/A 02/03/2024   Procedure: SUBQ LEAD REMOVAL;  Surgeon: Nancey Eulas BRAVO, MD;  Location: [facility] INVASIVE CV LAB;  Service: Cardiovascular;  Laterality: N/A;    reports that he has never smoked. He has never been exposed to tobacco smoke. He has never used smokeless tobacco. He reports that he does not drink alcohol and does not use drugs. family history includes Aneurysm (age of onset: 81) in his sister; Diabetes in his father; Healthy in his brother, brother, and brother; Heart attack (age of onset: 72) in his father; Heart disease in his father; Hypertension in his sister; Lung cancer in his mother. No Known Allergies Current Outpatient Medications on File Prior to Visit  Medication Sig Dispense Refill   allopurinol  (ZYLOPRIM ) 300 MG tablet Take 1 tablet (300 mg total) by  mouth daily. 90 tablet 3   azelastine  (OPTIVAR ) 0.05 % ophthalmic solution Place 1 drop into both eyes 2 (two) times daily. 6 mL 12   carvedilol  (COREG ) 25 MG tablet Take 1 tablet (25 mg total) by mouth 2 (two) times daily with a meal. 180 tablet 3   dapagliflozin  propanediol (FARXIGA ) 10 MG TABS tablet Take 1 tablet (10 mg total) by mouth daily before breakfast. 100 tablet 3   furosemide  (LASIX ) 40 MG tablet Take 1 tablet (40 mg total) by mouth daily. 90 tablet 3   indomethacin  (INDOCIN ) 50 MG capsule TAKE 1 CAPSULE BY MOUTH 3 TIMES  DAILY AS NEEDED 300 capsule 2   pantoprazole  (PROTONIX ) 40 MG tablet Take 1 tablet (40 mg total) by mouth daily. 90  tablet 3   pravastatin  (PRAVACHOL ) 40 MG tablet Take 1 tablet (40 mg total) by mouth every evening. 90 tablet 3   sacubitril -valsartan  (ENTRESTO ) 97-103 MG Take 1 tablet by mouth 2 (two) times daily. 200 tablet 3   spironolactone  (ALDACTONE ) 25 MG tablet Take 0.5 tablets (12.5 mg total) by mouth daily. 50 tablet 2   tirzepatide  (MOUNJARO ) 7.5 MG/0.5ML Pen Inject 7.5 mg into the skin once a week. 6 mL 3   vitamin B-12 (CYANOCOBALAMIN ) 1000 MCG tablet Take 1 tablet (1,000 mcg total) by mouth daily. (Patient taking differently: Take 1,000 mcg by mouth once a week.) 90 tablet 1   VITAMIN D  PO Take 1 tablet by mouth daily.     No current facility-administered medications on file prior to visit.        ROS:  All others reviewed and negative.  Objective        PE:  BP 118/66 (BP Location: Right Arm, Patient Position: Sitting, Cuff Size: Normal)   Pulse 80   Temp 98.7 F (37.1 C) (Oral)   Ht 5' 10 (1.778 m)   Wt 178 lb (80.7 kg)   SpO2 97%   BMI 25.54 kg/m                 Constitutional: Pt appears in NAD               HENT: Head: NCAT.                Right Ear: External ear normal.                 Left Ear: External ear normal.                Eyes: . Pupils are equal, round, and reactive to light. Conjunctivae and EOM are normal               Nose: without d/c or deformity               Neck: Neck supple. Gross normal ROM               Cardiovascular: Normal rate and regular rhythm.                 Pulmonary/Chest: Effort normal and breath sounds without rales or wheezing.                Abd:  Soft, NT, ND, + BS, no organomegaly               Neurological: Pt is alert. At baseline orientation, motor grossly intact               Skin: Skin is warm. No rashes, no  other new lesions, LE edema - none               Psychiatric: Pt behavior is normal without agitation   Micro: none  Cardiac tracings I have personally interpreted today:  none  Pertinent Radiological findings (summarize):  none   Lab Results  Component Value Date   WBC 7.8 01/04/2024   HGB 16.0 01/04/2024   HCT 49.2 01/04/2024   PLT 163 01/04/2024   GLUCOSE 98 03/27/2024   CHOL 114 03/27/2024   TRIG 139.0 03/27/2024   HDL 34.70 (L) 03/27/2024   LDLDIRECT 73.0 04/01/2021   LDLCALC 52 03/27/2024   ALT 14 03/27/2024   AST 16 03/27/2024   NA 142 03/27/2024   K 3.8 03/27/2024   CL 104 03/27/2024   CREATININE 1.27 03/27/2024   BUN 13 03/27/2024   CO2 26 03/27/2024   TSH 2.05 09/15/2023   PSA 0.98 09/15/2023   INR 1.2 (H) 03/15/2015   HGBA1C 5.5 03/27/2024   MICROALBUR 0.0 09/15/2023   Assessment/Plan:  Matthew Guerrero is a 67 y.o. Black or African American [2] male with  has a past medical history of ALLERGIC RHINITIS, Allergy, B12 deficiency (04/06/2020), Cervical radiculitis, CHF (congestive heart failure) (HCC), Chronic systolic heart failure (HCC) (90/91/7983), GERD (gastroesophageal reflux disease), GOUT, Hyperglycemia (08/28/2014), HYPERLIPIDEMIA, HYPERTENSION, Lateral epicondylitis of right elbow, Nonischemic cardiomyopathy (HCC) (09/28/2014), Obesity (BMI 30-39.9) (06/06/2015), S/P cardiac cath wjith normal coronary arteries  (09/28/2014), Sleep apnea, and Thrombocytopenia (04/06/2020).  B12 deficiency Lab Results  Component Value Date   VITAMINB12 322 09/15/2023   Stable, cont oral replacement - b12 1000 mcg qd   CKD (chronic kidney disease) stage 3, GFR 30-59 ml/min (HCC) Ckd3a Lab Results  Component Value Date   CREATININE 1.27 03/27/2024   Stable overall, cont to avoid nephrotoxins   Type 2 diabetes mellitus with diabetic chronic kidney disease (HCC) Lab Results  Component Value Date   HGBA1C 5.5 03/27/2024  With hyperglycemia Stable, pt to continue current medical treatment farxiga  10 every day, mounjaro  7.5 mg weekly Lab Results  Component Value Date   CREATININE 1.27 03/27/2024   Stable overall, cont to avoid nephrotoxins  Essential hypertension BP Readings from  Last 3 Encounters:  04/05/24 118/66  02/04/24 137/71  12/13/23 108/64   Stable, pt to continue medical treatment coreg  25 bid   Hyperlipidemia Lab Results  Component Value Date   LDLCALC 52 03/27/2024   Stable, pt to continue current statin pravachol  40 mg qd   Vitamin D  deficiency Last vitamin D  Lab Results  Component Value Date   VD25OH 49.50 09/15/2023   Stable, cont oral replacement  Followup: Return in about 6 months (around 10/04/2024).  Matthew Rush, MD 04/06/2024 11:02 AM [facility]

## 2024-04-05 NOTE — Assessment & Plan Note (Signed)
 Lab Results  Component Value Date   VITAMINB12 322 09/15/2023   Stable, cont oral replacement - b12 1000 mcg qd

## 2024-04-05 NOTE — Assessment & Plan Note (Signed)
 BP Readings from Last 3 Encounters:  04/05/24 118/66  02/04/24 137/71  12/13/23 108/64   Stable, pt to continue medical treatment coreg  25 bid

## 2024-04-05 NOTE — Patient Instructions (Signed)
You had the flu shot today  /Please continue all other medications as before, and refills have been done if requested.  Please have the pharmacy call with any other refills you may need.  Please continue your efforts at being more active, low cholesterol diet, and weight control  Please keep your appointments with your specialists as you may have planned  Please make an Appointment to return in 6 months, or sooner if needed, also with Lab Appointment for testing done 3-5 days before at the FIRST FLOOR Lab (so this is for TWO appointments - please see the scheduling desk as you leave)

## 2024-04-05 NOTE — Assessment & Plan Note (Signed)
 Lab Results  Component Value Date   LDLCALC 52 03/27/2024   Stable, pt to continue current statin pravachol  40 mg qd

## 2024-04-06 ENCOUNTER — Encounter: Payer: Self-pay | Admitting: Internal Medicine

## 2024-04-06 NOTE — Addendum Note (Signed)
 Addended by: NORLEEN LYNWOOD ORN on: 04/06/2024 11:02 AM   Modules accepted: Level of Service

## 2024-05-05 ENCOUNTER — Ambulatory Visit: Attending: Cardiovascular Disease | Admitting: Cardiovascular Disease

## 2024-05-05 ENCOUNTER — Encounter: Payer: Self-pay | Admitting: Cardiovascular Disease

## 2024-05-05 VITALS — BP 118/84 | HR 90 | Ht 70.0 in | Wt 178.0 lb

## 2024-05-05 DIAGNOSIS — Z95810 Presence of automatic (implantable) cardiac defibrillator: Secondary | ICD-10-CM | POA: Diagnosis not present

## 2024-05-05 DIAGNOSIS — I428 Other cardiomyopathies: Secondary | ICD-10-CM | POA: Diagnosis not present

## 2024-05-05 DIAGNOSIS — I5022 Chronic systolic (congestive) heart failure: Secondary | ICD-10-CM

## 2024-05-05 NOTE — Progress Notes (Signed)
 Electrophysiology Office Note:    Date:  05/05/2024   ID:  Matthew Guerrero, DOB 1955/10/26, MRN [id number]  PCP:  Norleen Lynwood ORN, MD   [facility] Providers Cardiologist:  Vinie JAYSON Maxcy, MD Electrophysiologist:  Eulas FORBES Furbish, MD  Sleep Medicine:  Wilbert Bihari, MD     Referring MD: Norleen Lynwood ORN, MD   History of Present Illness:    Matthew Guerrero is a 67 y.o. male with a medical history significant for nonischemic cardiomyopathy with [facility] [employer] subcutaneous ICD, obstructive sleep apnea on CPAP, hypertension, who presents for electrophysiology follow-up.       Discussed the use of AI scribe software for clinical note transcription with the patient, who gave verbal consent to proceed.  History of Present Illness Matthew Guerrero is a 68 year old male with non-ischemic cardiomyopathy who presents with inappropriate shocks from his ICD.  He has a history of non-ischemic cardiomyopathy and had a [employer] subcutaneous ICD placed by Dr. Fernande in 2016 due nonischemic cardiomyopathy. In 2022, he underwent a generator change for the ICD. Within the past several months, T wave oversensing has been noticed on his device, so he is programmed from the secondary vector to the alternate vector.  Recently, he has been experiencing inappropriate shocks from his ICD, describing the sensation as feeling like 'getting hit by a truck.' No pain or discomfort from the ICD otherwise.  The ICD is currently on advisory for premature battery depletion.  It was explanted in August 2025.         Today, he reports that he is doing well and has no complaints.  EKGs/Labs/Other Studies Reviewed Today:     Echocardiogram:  TEE April 2023 LVEF 35 to 40%.  Grade 1 diastolic dysfunction.  Hypermobile septum without PFO.  Degenerative mitral valve with trivial mitral regurgitation   EKG:   EKG Interpretation Date/Time:  Friday May 05 2024 14:00:54 EST Ventricular  Rate:  90 PR Interval:  170 QRS Duration:  108 QT Interval:  368 QTC Calculation: 450 R Axis:   68  Text Interpretation: Normal sinus rhythm Incomplete left bundle branch block ST & T wave abnormality, consider inferolateral ischemia When compared with ECG of 03-Feb-2024 17:20, Vent. rate has increased BY  38 BPM Questionable change in QRS axis Confirmed by Furbish Eulas [phone]) on 05/05/2024 2:07:05 PM     Physical Exam:    VS:  BP 118/84 (BP Location: Right Arm, Patient Position: Sitting, Cuff Size: Large)   Pulse 90   Ht 5' 10 (1.778 m)   Wt 178 lb (80.7 kg)   SpO2 96%   BMI 25.54 kg/m     Wt Readings from Last 3 Encounters:  05/05/24 178 lb (80.7 kg)  04/05/24 178 lb (80.7 kg)  02/03/24 181 lb 8 oz (82.3 kg)     GEN:  Well nourished, well developed in no acute distress CARDIAC: RRR, no murmurs, rubs, gallops RESPIRATORY:  Normal work of breathing MUSCULOSKELETAL: no edema    ASSESSMENT & PLAN:     Inappropriate ICD shocks He has had T wave oversensing in all vectors and has received inappropriate shocks His SQ ICD was explanted, and he has been doing well since I will see him back in EP clinic as needed  Nonischemic cardiomyopathy EF is now greater than 35% He has not had any VT events detected by his device requiring therapy since device placement 9 years ago At this point, he would not meet criteria for placement of  a new device We will continue to monitor.  If his EF drops below 35%, we will need to reconsider placement of a Denovo transvenous defibrillator      Signed, Eulas FORBES Furbish, MD  05/05/2024 2:09 PM    [facility]

## 2024-05-09 ENCOUNTER — Encounter: Payer: Self-pay | Admitting: Internal Medicine

## 2024-05-09 MED ORDER — AZELASTINE HCL 0.1 % NA SOLN: 1.0000 | Freq: Two times a day (BID) | NASAL | 12 refills | Status: AC

## 2024-05-09 MED ORDER — FEXOFENADINE HCL 180 MG PO TABS: 180.0000 mg | ORAL_TABLET | Freq: Every day | ORAL | 2 refills | Status: AC

## 2024-06-02 ENCOUNTER — Encounter: Payer: Self-pay | Admitting: Cardiology

## 2024-07-05 ENCOUNTER — Telehealth: Payer: Self-pay

## 2024-07-05 NOTE — Telephone Encounter (Signed)
 SABRA

## 2024-07-07 ENCOUNTER — Ambulatory Visit: Admitting: Cardiology

## 2024-07-07 VITALS — BP 100/60 | HR 80 | Ht 70.0 in | Wt 178.0 lb

## 2024-07-07 DIAGNOSIS — G4733 Obstructive sleep apnea (adult) (pediatric): Secondary | ICD-10-CM

## 2024-07-07 DIAGNOSIS — I10 Essential (primary) hypertension: Secondary | ICD-10-CM

## 2024-07-07 NOTE — Patient Instructions (Signed)
 Medication Instructions:  No changs *If you need a refill on your cardiac medications before your next appointment, please call your pharmacy*  Lab Work: None ordered If you have labs (blood work) drawn today and your tests are completely normal, you will receive your results only by: MyChart Message (if you have MyChart) OR A paper copy in the mail If you have any lab test that is abnormal or we need to change your treatment, we will call you to review the results.  Testing/Procedures: None ordered  Follow-Up: At [facility], you and your health needs are our priority.  As part of our continuing mission to provide you with exceptional heart care, our providers are all part of one team.  This team includes your primary Cardiologist (physician) and Advanced Practice Providers or APPs (Physician Assistants and Nurse Practitioners) who all work together to provide you with the care you need, when you need it.  Your next appointment:   1 year(s)- Sleep  Provider:   Dr Shlomo  We recommend signing up for the patient portal called MyChart.  Sign up information is provided on this After Visit Summary.  MyChart is used to connect with patients for Virtual Visits (Telemedicine).  Patients are able to view lab/test results, encounter notes, upcoming appointments, etc.  Non-urgent messages can be sent to your provider as well.   To learn more about what you can do with MyChart, go to [email or web address].

## 2024-07-07 NOTE — Progress Notes (Addendum)
 "  Date:  07/07/2024   ID:  Matthew Guerrero, DOB 14-Sep-1955, MRN [id number]  PCP:  Norleen Lynwood ORN, MD  Cardiologist:  Vinie JAYSON Maxcy, MD  Sleep Medicine:  Wilbert Bihari, MD Electrophysiologist:  Eulas FORBES Furbish, MD   Chief Complaint:  OSA  History of Present Illness:    Matthew Guerrero is a 69 y.o. male with a hx of moderate OSA with an AHI of 23/hr and mild to moderate snoring and associated nocturnal hypoxemia with his respiratory events as low as 71%.  He was titrated to 9 cm H2O.    He is doing well with his PAP device.  He tolerates the nasal pillow mask and feels the pressure is adequate.  He feels rested in the am and has no significant daytime sleepiness.  A denies any significant mouth or nasal dryness or nasal congestion.  He does not think that he snores. An Epworth Sleepiness Scale score was calculated the office today and this endorsed at 3 arguing against residual daytime sleepiness. Patient denies any episodes of bruxism, restless legs, No hypnogognic hallucinations or cataplectic events.    Prior CV studies:   The following studies were reviewed today:  PAP compliance data  Past Medical History:  Diagnosis Date   ALLERGIC RHINITIS    Allergy    B12 deficiency 04/06/2020   Cervical radiculitis    CHF (congestive heart failure) (HCC)    Chronic systolic heart failure (HCC) 02/28/2015   GERD (gastroesophageal reflux disease)    GOUT    Hyperglycemia 08/28/2014   HYPERLIPIDEMIA    HYPERTENSION    Lateral epicondylitis of right elbow    Nonischemic cardiomyopathy (HCC) 09/28/2014   Obesity (BMI 30-39.9) 06/06/2015   S/P cardiac cath wjith normal coronary arteries  09/28/2014   Sleep apnea    wears CPAP   Thrombocytopenia 04/06/2020   Past Surgical History:  Procedure Laterality Date   COLONOSCOPY     COLONOSCOPY WITH PROPOFOL  N/A 06/17/2017   Procedure: COLONOSCOPY WITH PROPOFOL ;  Surgeon: Teressa Toribio SQUIBB, MD;  Location: [facility] ENDOSCOPY;  Service: Endoscopy;   Laterality: N/A;   EP IMPLANTABLE DEVICE N/A 03/21/2015   Procedure: SubQ ICD Implant;  Surgeon: Elspeth JAYSON Sage, MD;  Location: [facility] INVASIVE CV LAB;  Service: Cardiovascular;  Laterality: N/A;   LEFT AND RIGHT HEART CATHETERIZATION WITH CORONARY ANGIOGRAM N/A 09/27/2014   Procedure: LEFT AND RIGHT HEART CATHETERIZATION WITH CORONARY ANGIOGRAM;  Surgeon: Victory ORN Sharps, MD;  Location: [facility] CATH LAB;  Service: Cardiovascular;  Laterality: N/A;   SUBQ DEVICE REMOVAL N/A 02/03/2024   Procedure: SUBQ DEVICE REMOVAL;  Surgeon: Furbish Eulas FORBES, MD;  Location: [facility] INVASIVE CV LAB;  Service: Cardiovascular;  Laterality: N/A;   SUBQ ICD  03/21/2015   SUBQ ICD IMPLANT N/A 02/10/2021   Procedure: SUBQ ICD IMPLANT;  Surgeon: Sage Elspeth JAYSON, MD;  Location: [facility] INVASIVE CV LAB;  Service: Cardiovascular;  Laterality: N/A;   SUBQ LEAD REMOVAL N/A 02/03/2024   Procedure: SUBQ LEAD REMOVAL;  Surgeon: Furbish Eulas FORBES, MD;  Location: [facility] INVASIVE CV LAB;  Service: Cardiovascular;  Laterality: N/A;     Current Meds  Medication Sig   allopurinol  (ZYLOPRIM ) 300 MG tablet Take 1 tablet (300 mg total) by mouth daily.   azelastine  (ASTELIN ) 0.1 % nasal spray Place 1 spray into both nostrils 2 (two) times daily. Use in each nostril as directed   azelastine  (OPTIVAR ) 0.05 % ophthalmic solution Place 1 drop into both eyes 2 (two) times daily.  carvedilol  (COREG ) 25 MG tablet Take 1 tablet (25 mg total) by mouth 2 (two) times daily with a meal.   dapagliflozin  propanediol (FARXIGA ) 10 MG TABS tablet Take 1 tablet (10 mg total) by mouth daily before breakfast.   fexofenadine  (ALLEGRA ) 180 MG tablet Take 1 tablet (180 mg total) by mouth daily.   furosemide  (LASIX ) 40 MG tablet Take 1 tablet (40 mg total) by mouth daily.   indomethacin  (INDOCIN ) 50 MG capsule TAKE 1 CAPSULE BY MOUTH 3 TIMES  DAILY AS NEEDED   pantoprazole  (PROTONIX ) 40 MG tablet Take 1 tablet (40 mg total) by mouth daily.   pravastatin  (PRAVACHOL ) 40 MG tablet  Take 1 tablet (40 mg total) by mouth every evening.   sacubitril -valsartan  (ENTRESTO ) 97-103 MG Take 1 tablet by mouth 2 (two) times daily.   spironolactone  (ALDACTONE ) 25 MG tablet Take 0.5 tablets (12.5 mg total) by mouth daily.   tirzepatide  (MOUNJARO ) 7.5 MG/0.5ML Pen Inject 7.5 mg into the skin once a week.   vitamin B-12 (CYANOCOBALAMIN ) 1000 MCG tablet Take 1 tablet (1,000 mcg total) by mouth daily. (Patient taking differently: Take 1,000 mcg by mouth once a week.)   VITAMIN D  PO Take 1 tablet by mouth daily.     Allergies:   Patient has no known allergies.   Social History   Tobacco Use   Smoking status: Never    Passive exposure: Never   Smokeless tobacco: Never  Vaping Use   Vaping status: Never Used  Substance Use Topics   Alcohol use: No    Alcohol/week: 0.0 standard drinks of alcohol   Drug use: No     Family Hx: The patient's family history includes Aneurysm (age of onset: 6) in his sister; Diabetes in his father; Healthy in his brother, brother, and brother; Heart attack (age of onset: 68) in his father; Heart disease in his father; Hypertension in his sister; Lung cancer in his mother. There is no history of Colon cancer, Esophageal cancer, Rectal cancer, or Stomach cancer.  ROS:   Please see the history of present illness.     All other systems reviewed and are negative.   Labs/Other Tests and Data Reviewed:    Recent Labs: 09/15/2023: TSH 2.05 01/04/2024: Hemoglobin 16.0; Platelets 163 03/27/2024: ALT 14; BUN 13; Creatinine, Ser 1.27; Potassium 3.8; Sodium 142   Recent Lipid Panel Lab Results  Component Value Date/Time   CHOL 114 03/27/2024 08:22 AM   CHOL 134 10/21/2016 02:38 PM   TRIG 139.0 03/27/2024 08:22 AM   HDL 34.70 (L) 03/27/2024 08:22 AM   HDL 35 (L) 10/21/2016 02:38 PM   CHOLHDL 3 03/27/2024 08:22 AM   LDLCALC 52 03/27/2024 08:22 AM   LDLCALC 68 10/21/2016 02:38 PM   LDLDIRECT 73.0 04/01/2021 07:47 AM    Wt Readings from Last 3  Encounters:  07/07/24 178 lb (80.7 kg)  05/05/24 178 lb (80.7 kg)  04/05/24 178 lb (80.7 kg)     Objective:    Vital Signs:  BP 100/60 (BP Location: Right Arm, Patient Position: Sitting, Cuff Size: Normal)   Pulse 80   Ht 5' 10 (1.778 m)   Wt 178 lb (80.7 kg)   SpO2 98%   BMI 25.54 kg/m  GEN: Well nourished, well developed in no acute distress HEENT: Normal NECK: No JVD; No carotid bruits LYMPHATICS: No lymphadenopathy CARDIAC:RRR, no murmurs, rubs, gallops RESPIRATORY:  Clear to auscultation without rales, wheezing or rhonchi  ABDOMEN: Soft, non-tender, non-distended MUSCULOSKELETAL:  No edema; No deformity  SKIN: Warm and dry NEUROLOGIC:  Alert and oriented x 3 PSYCHIATRIC:  Normal affect  ASSESSMENT & PLAN:    OSA - The patient is tolerating PAP therapy well without any problems. The PAP download performed by his DME was personally reviewed and interpreted by me today and showed an AHI of 1.6 /hr on auto CPAP from 4-18 cm H2O with 93% compliance in using more than 4 hours nightly.  95th% pressure 11.9cm H2O. The patient has been using and benefiting from PAP use and will continue to benefit from therapy.    Hypertension -BP controlled on exam today at 100 over 60 mm of 3 -Continue carvedilol  25 mg twice daily, Entresto  97-103 mg twice daily, spironolactone  12.5 mg daily with as needed refills -I have personally reviewed and interpreted outside labs performed by patient's PCP which showed SCr 1.27, potassium 3.8 on 03/27/2024  Medication Adjustments/Labs and Tests Ordered: Current medicines are reviewed at length with the patient today.  Concerns regarding medicines are outlined above.  Tests Ordered: No orders of the defined types were placed in this encounter.   Medication Changes: No orders of the defined types were placed in this encounter.    Disposition:  Follow up in 1 year(s)  Signed, Wilbert Bihari, MD  07/07/2024 9:13 AM    [facility] "

## 2024-07-10 ENCOUNTER — Telehealth: Payer: Self-pay | Admitting: *Deleted

## 2024-07-10 ENCOUNTER — Other Ambulatory Visit: Payer: Self-pay | Admitting: Internal Medicine

## 2024-07-10 DIAGNOSIS — I5022 Chronic systolic (congestive) heart failure: Secondary | ICD-10-CM

## 2024-07-10 DIAGNOSIS — G4733 Obstructive sleep apnea (adult) (pediatric): Secondary | ICD-10-CM

## 2024-07-10 DIAGNOSIS — I10 Essential (primary) hypertension: Secondary | ICD-10-CM

## 2024-07-10 NOTE — Telephone Encounter (Signed)
-----   Message from Wilbert Bihari, MD sent at 07/07/2024  9:12 AM EST ----- Order new supplies

## 2024-07-10 NOTE — Telephone Encounter (Signed)
 Order faxed to [facility] via fax # [phone].

## 2024-07-11 ENCOUNTER — Encounter: Payer: Self-pay | Admitting: Internal Medicine

## 2024-07-11 ENCOUNTER — Other Ambulatory Visit: Payer: Self-pay | Admitting: Physician Assistant

## 2024-07-12 ENCOUNTER — Other Ambulatory Visit: Payer: Self-pay

## 2024-07-13 ENCOUNTER — Encounter: Payer: Self-pay | Admitting: Internal Medicine

## 2024-07-13 DIAGNOSIS — E785 Hyperlipidemia, unspecified: Secondary | ICD-10-CM

## 2024-07-13 DIAGNOSIS — I428 Other cardiomyopathies: Secondary | ICD-10-CM

## 2024-07-14 MED ORDER — PRAVASTATIN SODIUM 40 MG PO TABS: 40.0000 mg | ORAL_TABLET | Freq: Every evening | ORAL | 1 refills | Status: AC

## 2024-07-14 MED ORDER — FUROSEMIDE 40 MG PO TABS: 40.0000 mg | ORAL_TABLET | Freq: Every day | ORAL | 1 refills | Status: AC

## 2024-07-17 MED ORDER — DAPAGLIFLOZIN PROPANEDIOL 10 MG PO TABS: 10.0000 mg | ORAL_TABLET | Freq: Every day | ORAL | 3 refills | Status: AC

## 2024-07-17 MED ORDER — CARVEDILOL 25 MG PO TABS: 25.0000 mg | ORAL_TABLET | Freq: Two times a day (BID) | ORAL | 3 refills | Status: AC

## 2024-07-17 NOTE — Addendum Note (Signed)
 Addended by: MEMORY DELON POUR on: 07/17/2024 07:47 AM   Modules accepted: Orders

## 2024-07-17 NOTE — Telephone Encounter (Signed)
 This has been addressed via another Mychart.

## 2024-07-18 MED ORDER — SPIRONOLACTONE 25 MG PO TABS: 12.5000 mg | ORAL_TABLET | Freq: Every day | ORAL | 1 refills | Status: AC

## 2024-07-18 NOTE — Addendum Note (Signed)
 Addended by: MEMORY DELON POUR on: 07/18/2024 06:57 AM   Modules accepted: Orders

## 2024-08-07 ENCOUNTER — Ambulatory Visit: Admitting: Internal Medicine

## 2024-09-25 ENCOUNTER — Other Ambulatory Visit

## 2024-10-02 ENCOUNTER — Encounter: Admitting: Internal Medicine

## 2024-10-02 ENCOUNTER — Ambulatory Visit

## 2024-10-04 ENCOUNTER — Ambulatory Visit: Admitting: Internal Medicine
